# Patient Record
Sex: Female | Born: 1955
Health system: Southern US, Community
[De-identification: ages and names within clinical notes are randomized; demographics above are authoritative.]

## PROBLEM LIST (undated history)

## (undated) DIAGNOSIS — B029 Zoster without complications: Secondary | ICD-10-CM

## (undated) DIAGNOSIS — D863 Sarcoidosis of skin: Secondary | ICD-10-CM

## (undated) DIAGNOSIS — K5790 Diverticulosis of intestine, part unspecified, without perforation or abscess without bleeding: Secondary | ICD-10-CM

## (undated) DIAGNOSIS — M1990 Unspecified osteoarthritis, unspecified site: Secondary | ICD-10-CM

## (undated) DIAGNOSIS — M79673 Pain in unspecified foot: Secondary | ICD-10-CM

## (undated) DIAGNOSIS — H409 Unspecified glaucoma: Secondary | ICD-10-CM

## (undated) DIAGNOSIS — K219 Gastro-esophageal reflux disease without esophagitis: Secondary | ICD-10-CM

## (undated) DIAGNOSIS — D649 Anemia, unspecified: Secondary | ICD-10-CM

## (undated) DIAGNOSIS — Z9221 Personal history of antineoplastic chemotherapy: Secondary | ICD-10-CM

## (undated) DIAGNOSIS — E785 Hyperlipidemia, unspecified: Secondary | ICD-10-CM

## (undated) DIAGNOSIS — T7840XA Allergy, unspecified, initial encounter: Secondary | ICD-10-CM

## (undated) DIAGNOSIS — Z923 Personal history of irradiation: Secondary | ICD-10-CM

## (undated) DIAGNOSIS — IMO0002 Reserved for concepts with insufficient information to code with codable children: Secondary | ICD-10-CM

## (undated) HISTORY — PX: COLONOSCOPY: SHX174

## (undated) HISTORY — PX: ROTATOR CUFF REPAIR: SHX139

## (undated) HISTORY — PX: SPINE SURGERY: SHX786

## (undated) HISTORY — DX: Allergy, unspecified, initial encounter: T78.40XA

## (undated) HISTORY — DX: Pain in unspecified foot: M79.673

## (undated) HISTORY — DX: Unspecified osteoarthritis, unspecified site: M19.90

## (undated) HISTORY — DX: Hyperlipidemia, unspecified: E78.5

## (undated) HISTORY — PX: OOPHORECTOMY: SHX86

## (undated) HISTORY — PX: TONSILLECTOMY: SUR1361

## (undated) HISTORY — DX: Unspecified glaucoma: H40.9

## (undated) HISTORY — DX: Zoster without complications: B02.9

## (undated) HISTORY — PX: APPENDECTOMY: SHX54

## (undated) HISTORY — DX: Sarcoidosis of skin: D86.3

## (undated) HISTORY — PX: DIAGNOSTIC LAPAROSCOPY: SUR761

## (undated) HISTORY — DX: Anemia, unspecified: D64.9

## (undated) HISTORY — PX: ABDOMINAL HYSTERECTOMY: SHX81

## (undated) HISTORY — PX: BREAST SURGERY: SHX581

## (undated) HISTORY — PX: FOOT NEUROMA SURGERY: SHX646

---

## 1997-05-06 HISTORY — PX: OTHER SURGICAL HISTORY: SHX169

## 1997-11-25 ENCOUNTER — Ambulatory Visit (HOSPITAL_COMMUNITY): Admission: RE | Admit: 1997-11-25 | Discharge: 1997-11-25 | Payer: Self-pay | Admitting: Obstetrics and Gynecology

## 1998-03-06 HISTORY — PX: ABDOMINAL HYSTERECTOMY: SHX81

## 1998-03-07 ENCOUNTER — Inpatient Hospital Stay (HOSPITAL_COMMUNITY): Admission: RE | Admit: 1998-03-07 | Discharge: 1998-03-10 | Payer: Self-pay | Admitting: Obstetrics and Gynecology

## 1998-12-12 ENCOUNTER — Ambulatory Visit (HOSPITAL_COMMUNITY): Admission: RE | Admit: 1998-12-12 | Discharge: 1998-12-12 | Payer: Self-pay | Admitting: Obstetrics and Gynecology

## 1998-12-12 ENCOUNTER — Encounter: Payer: Self-pay | Admitting: Obstetrics and Gynecology

## 1999-12-13 ENCOUNTER — Ambulatory Visit (HOSPITAL_COMMUNITY): Admission: RE | Admit: 1999-12-13 | Discharge: 1999-12-13 | Payer: Self-pay | Admitting: Obstetrics and Gynecology

## 1999-12-13 ENCOUNTER — Encounter: Payer: Self-pay | Admitting: Obstetrics and Gynecology

## 2000-05-15 ENCOUNTER — Other Ambulatory Visit: Admission: RE | Admit: 2000-05-15 | Discharge: 2000-05-15 | Payer: Self-pay | Admitting: Obstetrics and Gynecology

## 2000-12-16 ENCOUNTER — Ambulatory Visit (HOSPITAL_COMMUNITY): Admission: RE | Admit: 2000-12-16 | Discharge: 2000-12-16 | Payer: Self-pay | Admitting: Obstetrics and Gynecology

## 2000-12-16 ENCOUNTER — Encounter: Payer: Self-pay | Admitting: Obstetrics and Gynecology

## 2001-12-21 ENCOUNTER — Encounter: Payer: Self-pay | Admitting: Obstetrics and Gynecology

## 2001-12-21 ENCOUNTER — Ambulatory Visit (HOSPITAL_COMMUNITY): Admission: RE | Admit: 2001-12-21 | Discharge: 2001-12-21 | Payer: Self-pay | Admitting: Obstetrics and Gynecology

## 2003-01-11 ENCOUNTER — Encounter: Payer: Self-pay | Admitting: Obstetrics and Gynecology

## 2003-01-11 ENCOUNTER — Ambulatory Visit (HOSPITAL_COMMUNITY): Admission: RE | Admit: 2003-01-11 | Discharge: 2003-01-11 | Payer: Self-pay | Admitting: Obstetrics and Gynecology

## 2003-07-08 ENCOUNTER — Other Ambulatory Visit: Admission: RE | Admit: 2003-07-08 | Discharge: 2003-07-08 | Payer: Self-pay | Admitting: Obstetrics and Gynecology

## 2004-01-20 ENCOUNTER — Ambulatory Visit (HOSPITAL_COMMUNITY): Admission: RE | Admit: 2004-01-20 | Discharge: 2004-01-20 | Payer: Self-pay | Admitting: Obstetrics and Gynecology

## 2004-03-16 ENCOUNTER — Ambulatory Visit: Payer: Self-pay | Admitting: Family Medicine

## 2004-08-22 ENCOUNTER — Ambulatory Visit: Payer: Self-pay | Admitting: Family Medicine

## 2004-09-12 ENCOUNTER — Ambulatory Visit: Payer: Self-pay | Admitting: Family Medicine

## 2005-02-08 ENCOUNTER — Ambulatory Visit (HOSPITAL_COMMUNITY): Admission: RE | Admit: 2005-02-08 | Discharge: 2005-02-08 | Payer: Self-pay | Admitting: Obstetrics and Gynecology

## 2005-07-04 ENCOUNTER — Ambulatory Visit: Payer: Self-pay | Admitting: Family Medicine

## 2005-08-04 ENCOUNTER — Encounter: Payer: Self-pay | Admitting: Family Medicine

## 2005-10-09 ENCOUNTER — Ambulatory Visit: Payer: Self-pay | Admitting: Family Medicine

## 2005-11-15 ENCOUNTER — Ambulatory Visit: Payer: Self-pay | Admitting: Family Medicine

## 2005-11-27 ENCOUNTER — Ambulatory Visit: Payer: Self-pay | Admitting: Family Medicine

## 2005-12-04 HISTORY — PX: OTHER SURGICAL HISTORY: SHX169

## 2005-12-25 ENCOUNTER — Ambulatory Visit: Payer: Self-pay | Admitting: Dermatology

## 2006-01-27 ENCOUNTER — Ambulatory Visit: Payer: Self-pay | Admitting: Family Medicine

## 2006-02-12 ENCOUNTER — Ambulatory Visit (HOSPITAL_COMMUNITY): Admission: RE | Admit: 2006-02-12 | Discharge: 2006-02-12 | Payer: Self-pay | Admitting: Obstetrics and Gynecology

## 2006-04-09 ENCOUNTER — Ambulatory Visit: Payer: Self-pay | Admitting: Family Medicine

## 2006-08-05 LAB — CONVERTED CEMR LAB: Pap Smear: NORMAL

## 2006-08-28 ENCOUNTER — Encounter: Payer: Self-pay | Admitting: Family Medicine

## 2006-08-28 DIAGNOSIS — Z8619 Personal history of other infectious and parasitic diseases: Secondary | ICD-10-CM | POA: Insufficient documentation

## 2006-08-28 DIAGNOSIS — Z87898 Personal history of other specified conditions: Secondary | ICD-10-CM

## 2007-02-18 ENCOUNTER — Ambulatory Visit (HOSPITAL_COMMUNITY): Admission: RE | Admit: 2007-02-18 | Discharge: 2007-02-18 | Payer: Self-pay | Admitting: Obstetrics and Gynecology

## 2007-03-02 ENCOUNTER — Ambulatory Visit: Payer: Self-pay | Admitting: Family Medicine

## 2007-03-04 ENCOUNTER — Encounter: Payer: Self-pay | Admitting: Family Medicine

## 2007-03-24 ENCOUNTER — Ambulatory Visit: Payer: Self-pay | Admitting: Family Medicine

## 2007-03-25 ENCOUNTER — Encounter (INDEPENDENT_AMBULATORY_CARE_PROVIDER_SITE_OTHER): Payer: Self-pay | Admitting: *Deleted

## 2007-03-25 LAB — FECAL OCCULT BLOOD, GUAIAC: Fecal Occult Blood: NEGATIVE

## 2007-12-10 ENCOUNTER — Ambulatory Visit: Payer: Self-pay | Admitting: Family Medicine

## 2008-01-22 ENCOUNTER — Telehealth (INDEPENDENT_AMBULATORY_CARE_PROVIDER_SITE_OTHER): Payer: Self-pay | Admitting: *Deleted

## 2008-02-22 ENCOUNTER — Ambulatory Visit (HOSPITAL_COMMUNITY): Admission: RE | Admit: 2008-02-22 | Discharge: 2008-02-22 | Payer: Self-pay | Admitting: Obstetrics and Gynecology

## 2008-03-02 ENCOUNTER — Ambulatory Visit: Payer: Self-pay | Admitting: Family Medicine

## 2008-03-03 LAB — CONVERTED CEMR LAB
ALT: 15 units/L (ref 0–35)
AST: 19 units/L (ref 0–37)
Albumin: 3.6 g/dL (ref 3.5–5.2)
Alkaline Phosphatase: 56 units/L (ref 39–117)
BUN: 16 mg/dL (ref 6–23)
Chloride: 107 meq/L (ref 96–112)
Eosinophils Relative: 1.6 % (ref 0.0–5.0)
Glucose, Bld: 82 mg/dL (ref 70–99)
HCT: 35.7 % — ABNORMAL LOW (ref 36.0–46.0)
HDL: 63.4 mg/dL (ref >39.0)
Monocytes Relative: 9.6 % (ref 3.0–12.0)
Neutrophils Relative %: 55.2 % (ref 43.0–77.0)
Platelets: 312 10*3/uL (ref 150–400)
Potassium: 4 meq/L (ref 3.5–5.1)
RBC: 3.83 M/uL — ABNORMAL LOW (ref 3.87–5.11)
Total CHOL/HDL Ratio: 3
Total Protein: 7.2 g/dL (ref 6.0–8.3)
VLDL: 16 mg/dL (ref 0–40)
WBC: 5.6 10*3/uL (ref 4.5–10.5)

## 2008-03-10 ENCOUNTER — Ambulatory Visit: Payer: Self-pay | Admitting: Internal Medicine

## 2008-03-24 ENCOUNTER — Ambulatory Visit: Payer: Self-pay | Admitting: Internal Medicine

## 2008-03-24 ENCOUNTER — Encounter: Payer: Self-pay | Admitting: Internal Medicine

## 2008-03-24 LAB — HM COLONOSCOPY

## 2008-03-29 ENCOUNTER — Encounter: Payer: Self-pay | Admitting: Internal Medicine

## 2008-12-20 ENCOUNTER — Telehealth: Payer: Self-pay | Admitting: Family Medicine

## 2009-02-27 ENCOUNTER — Encounter: Payer: Self-pay | Admitting: Family Medicine

## 2009-03-01 ENCOUNTER — Ambulatory Visit (HOSPITAL_COMMUNITY): Admission: RE | Admit: 2009-03-01 | Discharge: 2009-03-01 | Payer: Self-pay | Admitting: Obstetrics and Gynecology

## 2009-03-10 ENCOUNTER — Ambulatory Visit: Payer: Self-pay | Admitting: Family Medicine

## 2009-03-10 DIAGNOSIS — J309 Allergic rhinitis, unspecified: Secondary | ICD-10-CM | POA: Insufficient documentation

## 2009-03-13 LAB — CONVERTED CEMR LAB
ALT: 17 units/L (ref 0–35)
Basophils Relative: 0 % (ref 0.0–3.0)
Bilirubin, Direct: 0 mg/dL (ref 0.0–0.3)
Chloride: 104 meq/L (ref 96–112)
Creatinine, Ser: 0.7 mg/dL (ref 0.4–1.2)
Eosinophils Relative: 1.2 % (ref 0.0–5.0)
HCT: 36.6 % (ref 36.0–46.0)
LDL Cholesterol: 86 mg/dL (ref 0–99)
MCV: 96.2 fL (ref 78.0–100.0)
Monocytes Absolute: 0.5 10*3/uL (ref 0.1–1.0)
Neutrophils Relative %: 53.2 % (ref 43.0–77.0)
Potassium: 3.9 meq/L (ref 3.5–5.1)
RBC: 3.81 M/uL — ABNORMAL LOW (ref 3.87–5.11)
Sodium: 142 meq/L (ref 135–145)
Total CHOL/HDL Ratio: 2
Total Protein: 7.3 g/dL (ref 6.0–8.3)
WBC: 5 10*3/uL (ref 4.5–10.5)

## 2009-07-04 LAB — CONVERTED CEMR LAB: Pap Smear: NORMAL

## 2009-11-01 ENCOUNTER — Ambulatory Visit: Payer: Self-pay | Admitting: Family Medicine

## 2010-02-03 LAB — HM MAMMOGRAPHY: HM Mammogram: NORMAL

## 2010-02-07 ENCOUNTER — Encounter: Payer: Self-pay | Admitting: Internal Medicine

## 2010-03-05 ENCOUNTER — Ambulatory Visit (HOSPITAL_COMMUNITY)
Admission: RE | Admit: 2010-03-05 | Discharge: 2010-03-05 | Payer: Self-pay | Source: Home / Self Care | Admitting: Obstetrics and Gynecology

## 2010-05-16 ENCOUNTER — Telehealth (INDEPENDENT_AMBULATORY_CARE_PROVIDER_SITE_OTHER): Payer: Self-pay | Admitting: *Deleted

## 2010-05-17 ENCOUNTER — Other Ambulatory Visit: Payer: Self-pay | Admitting: Family Medicine

## 2010-05-17 ENCOUNTER — Ambulatory Visit
Admission: RE | Admit: 2010-05-17 | Discharge: 2010-05-17 | Payer: Self-pay | Source: Home / Self Care | Attending: Family Medicine | Admitting: Family Medicine

## 2010-05-17 LAB — CBC WITH DIFFERENTIAL/PLATELET
Basophils Absolute: 0 10*3/uL (ref 0.0–0.1)
Basophils Relative: 0.3 % (ref 0.0–3.0)
Eosinophils Absolute: 0.1 10*3/uL (ref 0.0–0.7)
Eosinophils Relative: 2.3 % (ref 0.0–5.0)
HCT: 34.4 % — ABNORMAL LOW (ref 36.0–46.0)
Hemoglobin: 11.8 g/dL — ABNORMAL LOW (ref 12.0–15.0)
Lymphocytes Relative: 33.9 % (ref 12.0–46.0)
Lymphs Abs: 2.1 10*3/uL (ref 0.7–4.0)
MCHC: 34.2 g/dL (ref 30.0–36.0)
MCV: 94.1 fl (ref 78.0–100.0)
Monocytes Absolute: 0.7 10*3/uL (ref 0.1–1.0)
Monocytes Relative: 10.5 % (ref 3.0–12.0)
Neutro Abs: 3.3 10*3/uL (ref 1.4–7.7)
Neutrophils Relative %: 53 % (ref 43.0–77.0)
Platelets: 274 10*3/uL (ref 150.0–400.0)
RBC: 3.65 Mil/uL — ABNORMAL LOW (ref 3.87–5.11)
RDW: 13.7 % (ref 11.5–14.6)
WBC: 6.3 10*3/uL (ref 4.5–10.5)

## 2010-05-17 LAB — LIPID PANEL
Cholesterol: 189 mg/dL (ref 0–200)
HDL: 78.2 mg/dL (ref >39.00)
LDL Cholesterol: 100 mg/dL — ABNORMAL HIGH (ref 0–99)
Total CHOL/HDL Ratio: 2
Triglycerides: 52 mg/dL (ref 0.0–149.0)
VLDL: 10.4 mg/dL (ref 0.0–40.0)

## 2010-05-17 LAB — BASIC METABOLIC PANEL
BUN: 18 mg/dL (ref 6–23)
CO2: 29 mEq/L (ref 19–32)
Calcium: 9.2 mg/dL (ref 8.4–10.5)
Chloride: 103 mEq/L (ref 96–112)
Creatinine, Ser: 0.7 mg/dL (ref 0.4–1.2)
GFR: 88.87 mL/min (ref >60.00)
Glucose, Bld: 84 mg/dL (ref 70–99)
Potassium: 4 mEq/L (ref 3.5–5.1)
Sodium: 138 mEq/L (ref 135–145)

## 2010-05-17 LAB — HEPATIC FUNCTION PANEL
ALT: 13 U/L (ref 0–35)
AST: 18 U/L (ref 0–37)
Albumin: 3.7 g/dL (ref 3.5–5.2)
Alkaline Phosphatase: 55 U/L (ref 39–117)
Bilirubin, Direct: 0 mg/dL (ref 0.0–0.3)
Total Bilirubin: 0.4 mg/dL (ref 0.3–1.2)
Total Protein: 6.6 g/dL (ref 6.0–8.3)

## 2010-05-17 LAB — TSH: TSH: 2.34 u[IU]/mL (ref 0.35–5.50)

## 2010-05-24 ENCOUNTER — Encounter (INDEPENDENT_AMBULATORY_CARE_PROVIDER_SITE_OTHER): Payer: Self-pay | Admitting: *Deleted

## 2010-05-24 ENCOUNTER — Ambulatory Visit
Admission: RE | Admit: 2010-05-24 | Discharge: 2010-05-24 | Payer: Self-pay | Source: Home / Self Care | Attending: Family Medicine | Admitting: Family Medicine

## 2010-05-24 DIAGNOSIS — D649 Anemia, unspecified: Secondary | ICD-10-CM | POA: Insufficient documentation

## 2010-06-07 NOTE — Progress Notes (Signed)
----   Converted from flag ---- ---- 05/15/2010 8:12 AM, Judith Part MD wrote: please check lipid and wellness v70.0 thanks  ---- 05/14/2010 11:36 AM, Liane Comber CMA (AAMA) wrote: Lab orders please! Good Morning! This pt is scheduled for cpx labs [place], which labs to draw and dx codes to use? Thanks Tasha ------------------------------

## 2010-06-07 NOTE — Assessment & Plan Note (Signed)
Summary: CPX/JRR   Vital Signs:  Patient profile:   55 year old female Height:      63.5 inches Weight:      151.50 pounds BMI:     26.51 Temp:     98.0 degrees F oral Pulse rate:   76 / minute Pulse rhythm:   regular BP sitting:   110 / 74  (left arm) Cuff size:   regular  Vitals Entered By: Selena Batten Dance CMA Duncan Dull) (May 24, 2010 9:48 AM) CC: CPx   History of Present Illness: here for wellness exam and also to review chronic medical problems   feeling good overall  had a little flare of opth zoster- being treated for that  more general aches and pains with age  exercise is really important to her   wt is up 5 lb  110/74 - good bp   hyst in 99-- was for endometriosis - long hx , no abn paps or cancer  gyn visit  3/11 -- did do pap  and breast exam  colonosc 09 with tics and fam hx ca-- due in 2014 is a little newly anemic no GI symptoms whatsoever   mam 10/11 nl  self exam- no lumps   td 06   lipids good but up a bit from last year Last Lipid ProfileCholesterol: 189 (05/17/2010 8:39:19 AM)HDL:  78.20 (05/17/2010 8:39:19 AM)LDL:  100 (05/17/2010 8:39:19 AM)Triglycerides:  Last Liver profileSGOT:  18 (05/17/2010 8:39:19 AM)SPGT:  13 (05/17/2010 8:39:19 AM)T. Bili:  0.4 (05/17/2010 8:39:19 AM)Alk Phos:  55 (05/17/2010 8:39:19 AM)  diet is very good  weight watchers diet as a rule some holiday eating   slt anemia hb is 11.8-- she last donated blood in 2010  none since then  has not changed diet at all   Allergies: 1)  ! Codeine 2)  ! Imitrex  Past History:  Past Medical History: Last updated: 03/02/2008 Cutaneous sarcoid migraine foot pain zoster- opthy (regular checks) GYN -- Dr Tenny Craw  derm-- Otilio Saber-- Al Corpus   Past Surgical History: Last updated: 04/01/2008 Hysterectomy (03/1998) Arm lesion- cutaneous sarcoid (12/2005), with normal CXR and ANA 2D Echo- neg. (1999) (11/09) colonoscopy- polyp, diverticulosis  Family History: Last updated:  03/02/2008 mother CAD, ETOH brother cad brother cad, pre diabetic, obese brother - colon/ liver cancer , DM   Social History: Last updated: 03/10/2009 non smoker  works for [employer]  regular exercise - curves/ elliptical/ walking no alcohol   Risk Factors: Smoking Status: never (08/28/2006)  Review of Systems General:  Denies fatigue, fever, loss of appetite, and malaise. Eyes:  Complains of eye irritation; denies blurring. CV:  Denies chest pain or discomfort, lightheadness, palpitations, and shortness of breath with exertion. Resp:  Denies cough, shortness of breath, and wheezing. GI:  Denies abdominal pain, change in bowel habits, indigestion, and nausea. GU:  Denies dysuria and urinary frequency. MS:  Denies muscle aches and cramps. Derm:  Denies itching, lesion(s), poor wound healing, and rash. Neuro:  Denies numbness and tingling. Psych:  mood is good . Endo:  Denies cold intolerance, excessive thirst, excessive urination, and heat intolerance. Heme:  Denies abnormal bruising and bleeding.  Physical Exam  General:  Well-developed,well-nourished,in no acute distress; alert,appropriate and cooperative throughout examination Head:  normocephalic, atraumatic, and no abnormalities observed.   Eyes:  vision grossly intact, pupils equal, pupils round, and pupils reactive to light.  no conjunctival pallor, injection or icterus  Ears:  R ear normal and L ear normal.  Nose:  no nasal discharge.   Mouth:  pharynx pink and moist.   Neck:  supple with full rom and no masses or thyromegally, no JVD or carotid bruit  Chest Wall:  No deformities, masses, or tenderness noted. Lungs:  Normal respiratory effort, chest expands symmetrically. Lungs are clear to auscultation, no crackles or wheezes. Heart:  Normal rate and regular rhythm. S1 and S2 normal without gallop, murmur, click, rub or other extra sounds. Abdomen:  Bowel sounds positive,abdomen soft and non-tender without  masses, organomegaly or hernias noted. no renal bruits  Msk:  No deformity or scoliosis noted of thoracic or lumbar spine.  no acute joint changes  Pulses:  R and L carotid,radial,femoral,dorsalis pedis and posterior tibial pulses are full and equal bilaterally Extremities:  No clubbing, cyanosis, edema, or deformity noted with normal full range of motion of all joints.   Neurologic:  sensation intact to light touch, gait normal, and DTRs symmetrical and normal.   Skin:  Intact without suspicious lesions or rashes lentigos diffusely  Cervical Nodes:  No lymphadenopathy noted Inguinal Nodes:  No significant adenopathy Psych:  normal affect, talkative and pleasant    Impression & Recommendations:  Problem # 1:  HEALTH MAINTENANCE EXAM (ICD-V70.0) Assessment Comment Only reviewed health habits including diet, exercise and skin cancer prevention reviewed health maintenance list and family history commended on good habits  rev wellness labs in detail   Problem # 2:  UNSPECIFIED ANEMIA (ICD-285.9) this is new and asymptomatic  with family hx of colon cancer will ref to GI  was originally due colonosc in 2014  Orders: Gastroenterology Referral (GI)  Problem # 3:  Hx of HERPES ZOSTER OPHTHALMICUS (ICD-053.20) Assessment: Comment Only this is improving - will continue opthy f/u  Complete Medication List: 1)  Premarin 0.45 Mg Tabs (Estrogens conjugated) .Marland Kitchen.. 1 by mouth qd 2)  Daily Multiple Vitamins Tabs (Multiple vitamin) .Marland Kitchen.. 1 by mouth qd 3)  Flonase 50 Mcg/act Susp (Fluticasone propionate) .Marland Kitchen.. 1 spray in each nostril once daily 4)  Calcium Plus Vitamin D 500-50 Mg-unit Caps (Calcium carbonate-vitamin d) .... As directed 5)  Lotemax 0.5 % Susp (Loteprednol etabonate) .... As directed 6)  Aspirin Adult Low Strength 81 Mg Tbec (Aspirin) .... One by mouth daily 7)  Mobic 15 Mg Tabs (Meloxicam) .Marland Kitchen.. 1 by mouth once daily with food 8)  Durezol 0.05 % Emul (Difluprednate) .Marland Kitchen.. 1 drop in  eye once daily  Other Orders: Prescription Created Electronically [id number])  Patient Instructions: 1)  we will do GI referral at check out for new anemia  2)  keep up the good work with healthy diet and exercise  Prescriptions: MOBIC 15 MG  TABS (MELOXICAM) 1 by mouth once daily with food  #90 x 3   Entered and Authorized by:   Judith Part MD   Signed by:   Judith Part MD on 05/24/2010   Method used:   Electronically to        [facility] [address] (retail)       [address], [address]       [place], [place]  [address]       Ph: [phone]       Fax: [phone]   RxID:   [id number] FLONASE 50 MCG/ACT SUSP (FLUTICASONE PROPIONATE) 1 spray in each nostril once daily  #1 mdi x 11   Entered and Authorized by:   Judith Part MD  Signed by:   Judith Part MD on 05/24/2010   Method used:   Electronically to        [facility] [address] (retail)       [address], [address]       [place], [place]  [address]       Ph: [phone]       Fax: [phone]   RxID:   [id number]    Orders Added: 1)  Gastroenterology Referral [GI] 2)  Prescription Created Electronically [[id number]] 3)  Est. Patient 40-64 years [phone]    Current Allergies (reviewed today): ! CODEINE ! IMITREX   Preventive Care Screening  Mammogram:    Date:  02/03/2010    Results:  normal   Pap Smear:    Date:  07/04/2009    Results:  normal

## 2010-06-07 NOTE — Assessment & Plan Note (Signed)
Summary: place on neck and scalp/alc   Vital Signs:  Patient profile:   55 year old female Height:      63.5 inches Weight:      146.38 pounds Temp:     98.4 degrees F oral Pulse rate:   76 / minute Pulse rhythm:   regular BP sitting:   118 / 72  (left arm) Cuff size:   regular  Vitals Entered By: Linde Gillis CMA Duncan Dull) (November 01, 2009 10:36 AM) CC: check place on neck and scalp   History of Present Illness: 55 yo here for boil on her scalp.  States that she has had several of these in past, typically drain pus and go away.  5 days ago, started to develop one on her left temporal scalp. This one has become very large, red and painful.  Husband stuck it with a sterile needle and it did drain a little.  Today, much less swollen but still red and painful.  Also noticed a swollen gland in her neck this morning.  No fevers, chills, nausea or vomiting.  Current Medications (verified): 1)  Premarin 0.45 Mg Tabs (Estrogens Conjugated) .Marland Kitchen.. 1 By Mouth Qd 2)  Daily Multiple Vitamins  Tabs (Multiple Vitamin) .Marland Kitchen.. 1 By Mouth Qd 3)  Flonase 50 Mcg/act Susp (Fluticasone Propionate) .Marland Kitchen.. 1 Spray in Each Nostril Once Daily 4)  Calcium Plus Vitamin D 500-50 Mg-Unit Caps (Calcium Carbonate-Vitamin D) .... As Directed 5)  Lotemax 0.5 %  Susp (Loteprednol Etabonate) .... As Directed 6)  Aspirin Adult Low Strength 81 Mg  Tbec (Aspirin) .... One By Mouth Daily 7)  Mobic 15 Mg  Tabs (Meloxicam) .Marland Kitchen.. 1 By Mouth Once Daily 8)  Keflex 500 Mg Caps (Cephalexin) .Marland Kitchen.. 1 Tablet Twice A Day X 7 Days  Allergies: 1)  ! Codeine 2)  ! Imitrex  Past History:  Past Medical History: Last updated: 03/02/2008 Cutaneous sarcoid migraine foot pain zoster- opthy (regular checks) GYN -- Dr Tenny Craw  derm-- Otilio Saber-- Al Corpus   Past Surgical History: Last updated: 04/01/2008 Hysterectomy (03/1998) Arm lesion- cutaneous sarcoid (12/2005), with normal CXR and ANA 2D Echo- neg. (1999) (11/09) colonoscopy-  polyp, diverticulosis  Family History: Last updated: 03/02/2008 mother CAD, ETOH brother cad brother cad, pre diabetic, obese brother - colon/ liver cancer , DM   Social History: Last updated: 03/10/2009 non smoker  works for [employer]  regular exercise - curves/ elliptical/ walking no alcohol   Risk Factors: Smoking Status: never (08/28/2006)  Review of Systems      See HPI General:  Denies fever and malaise. GI:  Denies nausea and vomiting.  Physical Exam  General:  Well-developed,well-nourished,in no acute distress; alert,appropriate and cooperative throughout examination Head:  3 cm x 2 cm, non fluctuant abscess with central opening, mild erythema surrounding it. Mildly tender to palpation. Cervical Nodes:  L anterior LN enlarged, non tender. Psych:  normal affect, talkative and pleasant    Impression & Recommendations:  Problem # 1:  CELLULITIS AND ABSCESS OF OTHER SPECIFIED SITE [phone]) Assessment New Given reactive lymph node with surrounding erythema, will place on 7 day course of by mouth keflex. Advised to follow up next week if abscess still painful and red, sooner if she develops systemic symptoms. Pt in agreement with plan. Her updated medication list for this problem includes:    Keflex 500 Mg Caps (Cephalexin) .Marland Kitchen... 1 tablet twice a day x 7 days  Complete Medication List: 1)  Premarin 0.45 Mg Tabs (Estrogens conjugated) .Marland KitchenMarland KitchenMarland Kitchen  1 by mouth qd 2)  Daily Multiple Vitamins Tabs (Multiple vitamin) .Marland Kitchen.. 1 by mouth qd 3)  Flonase 50 Mcg/act Susp (Fluticasone propionate) .Marland Kitchen.. 1 spray in each nostril once daily 4)  Calcium Plus Vitamin D 500-50 Mg-unit Caps (Calcium carbonate-vitamin d) .... As directed 5)  Lotemax 0.5 % Susp (Loteprednol etabonate) .... As directed 6)  Aspirin Adult Low Strength 81 Mg Tbec (Aspirin) .... One by mouth daily 7)  Mobic 15 Mg Tabs (Meloxicam) .Marland Kitchen.. 1 by mouth once daily 8)  Keflex 500 Mg Caps (Cephalexin) .Marland Kitchen.. 1 tablet  twice a day x 7 days Prescriptions: KEFLEX 500 MG CAPS (CEPHALEXIN) 1 tablet twice a day x 7 days  #14 x 0   Entered and Authorized by:   Ruthe Mannan MD   Signed by:   Ruthe Mannan MD on 11/01/2009   Method used:   Electronically to        [facility] [address] (retail)       [address]       [place], [place]  [address]       Ph: [phone]       Fax: [phone]   RxID:   [id number]   Current Allergies (reviewed today): ! CODEINE ! IMITREX

## 2010-06-07 NOTE — Letter (Signed)
Summary: New Patient letter  [facility]  [address] [place], [place] [address]   Phone: [phone]  Fax: [phone]       05/24/2010 MRN: [id number]  [facility] [address] [place], [place]  [address]  Dear Ms. Lipps,  Welcome to the Gastroenterology Division at [facility].    You are scheduled to see Dr.  Stan Head on July 09, 2010 at 9:00am on the 3rd floor at [facility], [address]  We ask that you try to arrive at our office 15 minutes prior to your appointment time to allow for check-in.  We would like you to complete the enclosed self-administered evaluation form prior to your visit and bring it with you on the day of your appointment.  We will review it with you.  Also, please bring a complete list of all your medications or, if you prefer, bring the medication bottles and we will list them.  Please bring your insurance card so that we may make a copy of it.  If your insurance requires a referral to see a specialist, please bring your referral form from your primary care physician.  Co-payments are due at the time of your visit and may be paid by cash, check or credit card.     Your office visit will consist of a consult with your physician (includes a physical exam), any laboratory testing he/she may order, scheduling of any necessary diagnostic testing (e.g. x-ray, ultrasound, CT-scan), and scheduling of a procedure (e.g. Endoscopy, Colonoscopy) if required.  Please allow enough time on your schedule to allow for any/all of these possibilities.    If you cannot keep your appointment, please call [phone] to cancel or reschedule prior to your appointment date.  This allows [place] the opportunity to schedule an appointment for another patient in need of care.  If you do not cancel or reschedule by 5 p.m. the business day prior to your appointment date, you will be charged a $50.00 late cancellation/no-show fee.    Thank you for  choosing [facility] for your medical needs.  We appreciate the opportunity to care for you.  Please visit [place] at our website  to learn more about our practice.                     Sincerely,                                                             The Gastroenterology Division

## 2010-06-07 NOTE — Miscellaneous (Signed)
Summary: FLU SHOT  Clinical Lists Changes  Observations: Added new observation of FLU VAX: Historical (02/02/2010 12:20)      Influenza Immunization History:    Influenza # 1:  Historical (02/02/2010)

## 2010-07-09 ENCOUNTER — Ambulatory Visit (INDEPENDENT_AMBULATORY_CARE_PROVIDER_SITE_OTHER): Payer: BC Managed Care – PPO | Admitting: Internal Medicine

## 2010-07-09 ENCOUNTER — Other Ambulatory Visit: Payer: Self-pay | Admitting: Internal Medicine

## 2010-07-09 ENCOUNTER — Encounter: Payer: Self-pay | Admitting: Internal Medicine

## 2010-07-09 ENCOUNTER — Encounter (INDEPENDENT_AMBULATORY_CARE_PROVIDER_SITE_OTHER): Payer: Self-pay | Admitting: *Deleted

## 2010-07-09 ENCOUNTER — Other Ambulatory Visit: Payer: BC Managed Care – PPO

## 2010-07-09 DIAGNOSIS — D649 Anemia, unspecified: Secondary | ICD-10-CM

## 2010-07-09 LAB — CBC WITH DIFFERENTIAL/PLATELET
Basophils Absolute: 0 10*3/uL (ref 0.0–0.1)
Eosinophils Absolute: 0.1 10*3/uL (ref 0.0–0.7)
HCT: 34.1 % — ABNORMAL LOW (ref 36.0–46.0)
Lymphs Abs: 1.6 10*3/uL (ref 0.7–4.0)
MCHC: 34.3 g/dL (ref 30.0–36.0)
MCV: 93.2 fl (ref 78.0–100.0)
Monocytes Absolute: 0.6 10*3/uL (ref 0.1–1.0)
Platelets: 304 10*3/uL (ref 150.0–400.0)
RDW: 13.6 % (ref 11.5–14.6)

## 2010-07-10 ENCOUNTER — Other Ambulatory Visit: Payer: Self-pay | Admitting: Internal Medicine

## 2010-07-10 ENCOUNTER — Other Ambulatory Visit: Payer: BC Managed Care – PPO

## 2010-07-10 DIAGNOSIS — D649 Anemia, unspecified: Secondary | ICD-10-CM

## 2010-07-10 LAB — IBC PANEL: Iron: 50 ug/dL (ref 42–145)

## 2010-07-11 ENCOUNTER — Telehealth: Payer: Self-pay | Admitting: Internal Medicine

## 2010-07-17 NOTE — Assessment & Plan Note (Signed)
Summary: ANEMAI.Marland KitchenMarland KitchenEM  SCHED WITH [place], [employer]- INS COPAY AND CX FEE AD...   History of Present Illness Visit Type: Initial Consult Primary GI MD: Stan Head MD [facility] Primary Provider: Roxy Manns, MD Requesting Provider: Roxy Manns, MD Chief Complaint: Anemia History of Present Illness:   55 yo white woman with a new mild anemia with Hgb 11.8. She donated blood once in 2010. She had a negative colonoscopy in 2009 due to family hx colon cancer and screening. No GI symtoms or bleeding noted.          Preventive Screening-Counseling & Management      Drug Use:  no.      Current Medications (verified): 1)  Premarin 0.45 Mg Tabs (Estrogens Conjugated) .Marland Kitchen.. 1 By Mouth Qd 2)  Daily Multiple Vitamins  Tabs (Multiple Vitamin) .Marland Kitchen.. 1 By Mouth Qd 3)  Flonase 50 Mcg/act Susp (Fluticasone Propionate) .Marland Kitchen.. 1 Spray in Each Nostril Once Daily 4)  Calcium Plus Vitamin D 500-50 Mg-Unit Caps (Calcium Carbonate-Vitamin D) .... As Directed 5)  Prednisolone Acetate 1 % Susp (Prednisolone Acetate) .... Instill in Left Eye Two Times A Day 6)  Aspirin Adult Low Strength 81 Mg  Tbec (Aspirin) .... One By Mouth Daily 7)  Mobic 15 Mg  Tabs (Meloxicam) .Marland Kitchen.. 1 By Mouth Once Daily With Food  Allergies (verified): 1)  ! Codeine 2)  ! Imitrex  Past History:  Past Medical History: Cutaneous sarcoid migraine foot pain zoster- opthy (regular checks)  GYN -- Dr Tenny Craw  derm-- Alva Garnet pod-- Al Corpus   Past Surgical History: Hysterectomy (03/1998) Arm lesion- cutaneous sarcoid (12/2005), with normal CXR and ANA 2D Echo- neg. (1999) (11/09) colonoscopy, diverticulosis, hemorrhoids C-section x2 Tonsillectomy  Family History: mother CAD, ETOH brother cad brother cad, pre diabetic, obese brother - colon cancer + liver mets at 46, DM   Social History: non smoker  works for [employer]  regular exercise - curves/ elliptical/ walking no alcohol  Daily Caffeine Use Illicit Drug Use -  no Drug Use:  no  Review of Systems       The patient complains of allergy/sinus, anemia, and change in vision.         All other ROS negative except as per HPI.   Vital Signs:  Patient profile:   55 year old female Height:      63.5 inches Weight:      151.38 pounds BMI:     26.49 Pulse rate:   64 / minute Pulse rhythm:   regular BP sitting:   110 / 68  (left arm) Cuff size:   regular  Vitals Entered By: June McMurray CMA Duncan Dull) (July 09, 2010 9:04 AM)  Physical Exam  General:  Well developed, well nourished, no acute distress. Mouth:  pink tongue and mucous membranes Lungs:  Clear throughout to auscultation. Heart:  Regular rate and rhythm; no murmurs, rubs,  or bruits. Abdomen:  Bowel sounds positive,abdomen soft and non-tender without masses, organomegaly or hernias noted.  Extremities:  no edema Psych:  Alert and cooperative. Normal mood and affect.   Impression & Recommendations:  Problem # 1:  UNSPECIFIED ANEMIA (ICD-285.9) Assessment Comment Only NEW Very mild normcytic anemia. Will first repeat CBC. Do not think endoscopic evaluation indicated at this time.  Orders: TLB-CBC Platelet - w/Differential ([id number])  Problem # 2:  NEOPLASM, MALIGNANT, COLON, FAMILY HX (ICD-V16.0) Assessment: Unchanged At this point plan fr routine colonoscpy in 2014, unless lab evaluation indicates therwise.  Patient Instructions: 1)  Copy  sent to : Roxy Manns, MD 2)  You will go to the basement for labs today 3)  The medication list was reviewed and reconciled.  All changed / newly prescribed medications were explained.  A complete medication list was provided to the patient / caregiver.

## 2010-07-17 NOTE — Progress Notes (Signed)
Summary: anemia follow-up  Phone Note Outgoing Call   Summary of Call: iron levels are normal but low normal ask her to do hemoccults re: anemia (guaiac testing not iFOBT) Iva Boop MD, [facility]  July 11, 2010 10:37 PM   Follow-up for Phone Call        left message on machine to call back Chales Abrahams CMA Duncan Dull)  July 12, 2010 4:26 PM   Spoke with the pt she ask to have the cards mailed to her home and she was instructed how to complete and will get those sent back as soon as possible.  Order added to EPIC Follow-up by: Chales Abrahams CMA Duncan Dull),  July 12, 2010 4:36 PM

## 2010-07-26 ENCOUNTER — Other Ambulatory Visit: Payer: BC Managed Care – PPO

## 2010-07-27 ENCOUNTER — Other Ambulatory Visit: Payer: Self-pay | Admitting: Internal Medicine

## 2010-07-27 ENCOUNTER — Other Ambulatory Visit: Payer: BC Managed Care – PPO

## 2010-07-27 DIAGNOSIS — Z0000 Encounter for general adult medical examination without abnormal findings: Secondary | ICD-10-CM

## 2010-07-27 LAB — HEMOCCULT SLIDES (X 3 CARDS)
Fecal Occult Blood: NEGATIVE
OCCULT 2: NEGATIVE
OCCULT 3: NEGATIVE
OCCULT 5: NEGATIVE

## 2010-07-31 ENCOUNTER — Telehealth: Payer: Self-pay

## 2010-07-31 DIAGNOSIS — E611 Iron deficiency: Secondary | ICD-10-CM

## 2010-07-31 NOTE — Telephone Encounter (Signed)
I spoke with the patient and she is advised of Dr Marvell Fuller recommendations.  New lab orders are placed for 10/01/10.  She will begin an OTC iron supplement.

## 2010-07-31 NOTE — Telephone Encounter (Signed)
Message copied by Darcey Nora on Tue Jul 31, 2010  8:27 AM ------      Message from: Stan Head      Created: Mon Jul 30, 2010  9:30 PM       Let her know no blood in stools      Overall it does not seem like she is having anemia due to GI blood loss, though iron levels low normal range      She should start ferrous sulfate 325 mg daily (add to med list)       Repeat CBC and ferritin re: anemia in 6 weeks.      Would not pursue endoscopic studies at this time.

## 2010-08-01 MED ORDER — FERROUS SULFATE 325 (65 FE) MG PO TBEC: DELAYED_RELEASE_TABLET | ORAL | Status: DC

## 2010-08-01 NOTE — Telephone Encounter (Signed)
Addended by: Darcey Nora on: 08/01/2010 09:18 AM   Modules accepted: Orders

## 2010-10-03 ENCOUNTER — Other Ambulatory Visit (INDEPENDENT_AMBULATORY_CARE_PROVIDER_SITE_OTHER): Payer: BC Managed Care – PPO

## 2010-10-03 DIAGNOSIS — E611 Iron deficiency: Secondary | ICD-10-CM

## 2010-10-03 LAB — CBC WITH DIFFERENTIAL/PLATELET
Basophils Absolute: 0 10*3/uL (ref 0.0–0.1)
Eosinophils Absolute: 0.1 10*3/uL (ref 0.0–0.7)
Lymphocytes Relative: 31 % (ref 12.0–46.0)
Lymphs Abs: 1.8 10*3/uL (ref 0.7–4.0)
MCHC: 33.9 g/dL (ref 30.0–36.0)
Monocytes Relative: 12.2 % — ABNORMAL HIGH (ref 3.0–12.0)
Platelets: 300 10*3/uL (ref 150.0–400.0)
RDW: 13.5 % (ref 11.5–14.6)

## 2010-10-03 LAB — FERRITIN: Ferritin: 37.6 ng/mL (ref 10.0–291.0)

## 2010-10-04 ENCOUNTER — Telehealth: Payer: Self-pay

## 2010-10-04 NOTE — Telephone Encounter (Signed)
Patient is scheduled for EGD [facility] 11/15/10 1:30.  Pre-visit is scheduled for 11/05/10 2:00

## 2010-10-04 NOTE — Telephone Encounter (Signed)
Message copied by Annett Fabian on Thu Oct 04, 2010  9:30 AM ------      Message from: Stan Head E      Created: Thu Oct 04, 2010  8:01 AM       Iron level and Hgb about the same despite iron therapy      Given this and Mobic use i recommend an EGD to evaluate the anemia

## 2010-10-04 NOTE — Progress Notes (Signed)
Quick Note:  Iron level and Hgb about the same despite iron therapy Given this and Mobic use i recommend an EGD to evaluate the anemia ______

## 2010-11-05 ENCOUNTER — Ambulatory Visit (AMBULATORY_SURGERY_CENTER): Payer: BC Managed Care – PPO | Admitting: *Deleted

## 2010-11-05 ENCOUNTER — Encounter: Payer: Self-pay | Admitting: Internal Medicine

## 2010-11-15 ENCOUNTER — Ambulatory Visit (AMBULATORY_SURGERY_CENTER): Payer: BC Managed Care – PPO | Admitting: Internal Medicine

## 2010-11-15 ENCOUNTER — Encounter: Payer: Self-pay | Admitting: Internal Medicine

## 2010-11-15 VITALS — BP 122/67 | HR 61 | Temp 97.6°F | Resp 17 | Ht 63.0 in | Wt 145.0 lb

## 2010-11-15 DIAGNOSIS — K2970 Gastritis, unspecified, without bleeding: Secondary | ICD-10-CM

## 2010-11-15 DIAGNOSIS — D649 Anemia, unspecified: Secondary | ICD-10-CM

## 2010-11-15 HISTORY — PX: UPPER GASTROINTESTINAL ENDOSCOPY: SHX188

## 2010-11-15 MED ORDER — SODIUM CHLORIDE 0.9 % IV SOLN: 500.0000 mL | INTRAVENOUS | Status: DC

## 2010-11-15 NOTE — Patient Instructions (Addendum)
The endoscopy showed a mild gastritis. Biopsies were taken to look for an infection called Helicobacter pylori which increases the risk of ulcers if present and if so will be treated with antibiotics. I will notify you of those results. If you need to take the Mobic on a daily basis long-term, I will probably recommend a long-term acid blocking medication to reduce the risk of ulcers in the future. Iva Boop, MD, [facility] Discharge instructions given. Handout on gastritis and a soft diet given. Resume previous medications.

## 2010-11-16 ENCOUNTER — Telehealth: Payer: Self-pay | Admitting: *Deleted

## 2010-11-16 DIAGNOSIS — K2990 Gastroduodenitis, unspecified, without bleeding: Secondary | ICD-10-CM

## 2010-11-16 DIAGNOSIS — D649 Anemia, unspecified: Secondary | ICD-10-CM

## 2010-11-16 DIAGNOSIS — K2970 Gastritis, unspecified, without bleeding: Secondary | ICD-10-CM

## 2010-11-16 NOTE — Telephone Encounter (Signed)

## 2010-11-22 ENCOUNTER — Encounter: Payer: Self-pay | Admitting: Internal Medicine

## 2010-11-22 NOTE — Progress Notes (Signed)
Quick Note:  1) Office RN to call her and let her know H. Pylori negative - she should have a repeat CBC and Ferritin next week. She may need to get her colonoscopy earlier if iron levels not improving - will discuss.  We do not have a clear cause as to why iron levels on low side  It is reasonable for her to take a PPI like OTC Prilosec or Prevacid on a daily basis to reduce risk of ulcers from Mobic and ASA though guidelines do not recommend to start until age 70 in that situation - she is getting close to that age. She can decide if she wants to as not yet 58 but getting close.  2) [facility] no letter or recall  ______

## 2010-11-23 ENCOUNTER — Telehealth: Payer: Self-pay

## 2010-11-23 DIAGNOSIS — D509 Iron deficiency anemia, unspecified: Secondary | ICD-10-CM

## 2010-11-23 NOTE — Telephone Encounter (Signed)
Patient advised.  Labs entered for Monday of next week.

## 2010-11-23 NOTE — Telephone Encounter (Signed)
Message copied by Annett Fabian on Fri Nov 23, 2010 11:27 AM ------      Message from: Stan Head E      Created: Thu Nov 22, 2010 10:02 PM       1) Office RN to call her and let her know H. Pylori negative - she should have a repeat CBC and Ferritin next week. She may need to get her colonoscopy earlier if iron levels not improving - will discuss.       We do not have a clear cause as to why iron levels on low side       It is reasonable for her to take a PPI like OTC Prilosec or Prevacid on a daily basis to reduce risk of ulcers from Mobic and ASA though guidelines do not recommend to start until age 78 in that situation - she is getting close to that age. She can decide if she wants to as not yet 58 but getting close.            2) [facility] no letter or recall

## 2010-11-26 ENCOUNTER — Other Ambulatory Visit (INDEPENDENT_AMBULATORY_CARE_PROVIDER_SITE_OTHER): Payer: BC Managed Care – PPO

## 2010-11-26 DIAGNOSIS — D509 Iron deficiency anemia, unspecified: Secondary | ICD-10-CM

## 2010-11-26 LAB — CBC WITH DIFFERENTIAL/PLATELET
Basophils Relative: 0.3 % (ref 0.0–3.0)
Eosinophils Relative: 1.1 % (ref 0.0–5.0)
MCV: 93.4 fl (ref 78.0–100.0)
Monocytes Absolute: 0.5 10*3/uL (ref 0.1–1.0)
Neutrophils Relative %: 63.8 % (ref 43.0–77.0)
RBC: 3.71 Mil/uL — ABNORMAL LOW (ref 3.87–5.11)
WBC: 7.2 10*3/uL (ref 4.5–10.5)

## 2010-11-28 NOTE — Progress Notes (Signed)
Quick Note:  Hgb same - just slightly low Iron level slightly better Since she was heme negative I would not do any other testing She should stay on ferrous sulfate and keep follow-up with Dr. Milinda Antis - would repeat iron levels in 3-6 months She has had a low normal or slightly low Hgb for 2 years or more - if this worsens over time would consider heme eval (per PCP) and repeat hemocults. Routine repeat colonoscopy 5 years after last (will be 2014)  ______

## 2011-02-21 ENCOUNTER — Other Ambulatory Visit (HOSPITAL_COMMUNITY): Payer: Self-pay | Admitting: Obstetrics and Gynecology

## 2011-02-21 DIAGNOSIS — Z1231 Encounter for screening mammogram for malignant neoplasm of breast: Secondary | ICD-10-CM

## 2011-03-05 ENCOUNTER — Encounter: Payer: Self-pay | Admitting: Family Medicine

## 2011-03-05 ENCOUNTER — Ambulatory Visit (INDEPENDENT_AMBULATORY_CARE_PROVIDER_SITE_OTHER): Payer: BC Managed Care – PPO | Admitting: Family Medicine

## 2011-03-05 DIAGNOSIS — M549 Dorsalgia, unspecified: Secondary | ICD-10-CM

## 2011-03-05 MED ORDER — CYCLOBENZAPRINE HCL 10 MG PO TABS: 5.0000 mg | ORAL_TABLET | Freq: Three times a day (TID) | ORAL | Status: AC | PRN

## 2011-03-05 NOTE — Progress Notes (Signed)
She missed a step going down stairs Sunday but caught herself on the way down.  Uncomfortable sitting/standing/laying down/walking.  R lower lumbar area sore.  No audible pop/snap. No other fall, trauma, LOC.  Heat in shower helps some, temporary.  Taking advil and using heating pad.  Planning on stopping mobic due to h/o anemia, monitored by GI.  No weakness in leg, no radicular sx.    Meds, vitals, and allergies reviewed.   ROS: See HPI.  Otherwise, noncontributory.  nad ncat rrr ctab Back w/o midline pain R SI tender on testing.  R lower paraspinal muscles ttp  R SLR neg, distally nv intact

## 2011-03-05 NOTE — Patient Instructions (Signed)
Take the flexeril as needed (sedation caution), stretch your lower back and use a heating pad.  Take care. This should gradually improve.

## 2011-03-06 DIAGNOSIS — M549 Dorsalgia, unspecified: Secondary | ICD-10-CM | POA: Insufficient documentation

## 2011-03-06 NOTE — Assessment & Plan Note (Signed)
Benign exam, likely muscle irritation and SI inflammation.  Use local heat, stretch and flexeril.  She'd like to avoid nsaids at this point.  She agrees with plan, sedation caution given on meds.  F/u prn.  No indication to image.  D/w pt.

## 2011-03-13 ENCOUNTER — Ambulatory Visit (HOSPITAL_COMMUNITY)
Admission: RE | Admit: 2011-03-13 | Discharge: 2011-03-13 | Disposition: A | Discharge status: Home / Self Care | Payer: BC Managed Care – PPO | Source: Ambulatory Visit | Attending: Obstetrics and Gynecology | Admitting: Obstetrics and Gynecology

## 2011-03-13 DIAGNOSIS — Z1231 Encounter for screening mammogram for malignant neoplasm of breast: Secondary | ICD-10-CM | POA: Insufficient documentation

## 2011-03-22 ENCOUNTER — Encounter: Payer: Self-pay | Admitting: Family Medicine

## 2011-03-22 ENCOUNTER — Ambulatory Visit (INDEPENDENT_AMBULATORY_CARE_PROVIDER_SITE_OTHER): Payer: BC Managed Care – PPO | Admitting: Family Medicine

## 2011-03-22 VITALS — BP 134/72 | HR 78 | Temp 98.2°F | Wt 150.0 lb

## 2011-03-22 DIAGNOSIS — M549 Dorsalgia, unspecified: Secondary | ICD-10-CM

## 2011-03-22 DIAGNOSIS — M5430 Sciatica, unspecified side: Secondary | ICD-10-CM

## 2011-03-22 MED ORDER — TRAMADOL HCL 50 MG PO TABS: 50.0000 mg | ORAL_TABLET | Freq: Four times a day (QID) | ORAL | Status: AC | PRN

## 2011-03-22 MED ORDER — MELOXICAM 15 MG PO TABS: 15.0000 mg | ORAL_TABLET | Freq: Every day | ORAL | Status: DC

## 2011-03-22 NOTE — Progress Notes (Signed)
Prev with R sided lower back pain.  Some better with time and flexeril.  Now (x3 days) with L sided pain, radiating down the leg (throught the lateral side of the leg).  Toes on L foot feel cold.  Pain lifting L leg to put on a pair of pants.  No other new falls, no trauma.  No FCNAV.  No weakness in legs but pain on movement/weight bearing on the left.    Off mobic for now.  Taking aleve w/o much relief.  The mobic had prev helped with headaches; HA returned while off mobic.    Meds, vitals, and allergies reviewed.   ROS: See HPI.  Otherwise, noncontributory.  nad but uncomfortable rrr ctab Back w/o midline pain but L SI area ttp and tender on testing L greater troch ttp and L SLR positive Distally nv intact with normal DTRs

## 2011-03-22 NOTE — Patient Instructions (Signed)
Stop the ibuprofen and aleve.  Take mobic once a day with food.  You can consider taking it with prilosec.  Take the tramadol for pain.  It can make you drowsy.  See Aram Beecham about your referral before you leave today.

## 2011-03-23 ENCOUNTER — Encounter: Payer: Self-pay | Admitting: Family Medicine

## 2011-03-23 NOTE — Assessment & Plan Note (Addendum)
Prev R sided sx improved.  Likely with L sided sx related to compensation for R side prev.  No indication for imaging at this point.  D/w pt.  Would use mobic, tramadol, flexeril and refer to PT.  She may need spine/ortho eval if refractory.  No red flag sx by history or exam.  She agrees with plan .

## 2011-06-09 ENCOUNTER — Telehealth: Payer: Self-pay | Admitting: Family Medicine

## 2011-06-09 DIAGNOSIS — D649 Anemia, unspecified: Secondary | ICD-10-CM

## 2011-06-09 DIAGNOSIS — Z0000 Encounter for general adult medical examination without abnormal findings: Secondary | ICD-10-CM

## 2011-06-09 NOTE — Telephone Encounter (Signed)
Message copied by Judy Pimple on Sun Jun 09, 2011  2:08 PM ------      Message from: Alvina Chou      Created: Tue Jun 04, 2011  2:46 PM      Regarding: Lab orders for 2.4.13       Patient is scheduled for CPX labs, please order future labs, Thanks , Camelia Eng

## 2011-06-10 ENCOUNTER — Other Ambulatory Visit (INDEPENDENT_AMBULATORY_CARE_PROVIDER_SITE_OTHER): Payer: BC Managed Care – PPO

## 2011-06-10 DIAGNOSIS — D649 Anemia, unspecified: Secondary | ICD-10-CM

## 2011-06-10 DIAGNOSIS — Z0000 Encounter for general adult medical examination without abnormal findings: Secondary | ICD-10-CM

## 2011-06-10 LAB — COMPREHENSIVE METABOLIC PANEL
ALT: 17 U/L (ref 0–35)
AST: 20 U/L (ref 0–37)
Albumin: 4 g/dL (ref 3.5–5.2)
BUN: 19 mg/dL (ref 6–23)
Calcium: 9.3 mg/dL (ref 8.4–10.5)
Chloride: 104 mEq/L (ref 96–112)
Potassium: 4.4 mEq/L (ref 3.5–5.1)
Sodium: 139 mEq/L (ref 135–145)
Total Protein: 7.3 g/dL (ref 6.0–8.3)

## 2011-06-10 LAB — CBC WITH DIFFERENTIAL/PLATELET
Basophils Relative: 0.3 % (ref 0.0–3.0)
Eosinophils Absolute: 0.1 10*3/uL (ref 0.0–0.7)
Lymphocytes Relative: 30.4 % (ref 12.0–46.0)
MCHC: 34 g/dL (ref 30.0–36.0)
Neutrophils Relative %: 57.9 % (ref 43.0–77.0)
RBC: 3.81 Mil/uL — ABNORMAL LOW (ref 3.87–5.11)
WBC: 5.5 10*3/uL (ref 4.5–10.5)

## 2011-06-10 LAB — TSH: TSH: 2.06 u[IU]/mL (ref 0.35–5.50)

## 2011-06-10 LAB — LDL CHOLESTEROL, DIRECT: Direct LDL: 102.9 mg/dL

## 2011-06-11 ENCOUNTER — Encounter: Payer: Self-pay | Admitting: Family Medicine

## 2011-06-12 ENCOUNTER — Encounter: Payer: Self-pay | Admitting: Family Medicine

## 2011-06-12 ENCOUNTER — Ambulatory Visit (INDEPENDENT_AMBULATORY_CARE_PROVIDER_SITE_OTHER): Payer: BC Managed Care – PPO | Admitting: Family Medicine

## 2011-06-12 VITALS — BP 118/64 | HR 72 | Temp 98.2°F | Ht 63.75 in | Wt 152.0 lb

## 2011-06-12 DIAGNOSIS — Z0000 Encounter for general adult medical examination without abnormal findings: Secondary | ICD-10-CM

## 2011-06-12 DIAGNOSIS — D649 Anemia, unspecified: Secondary | ICD-10-CM

## 2011-06-12 MED ORDER — MELOXICAM 15 MG PO TABS: 15.0000 mg | ORAL_TABLET | Freq: Every day | ORAL | Status: DC

## 2011-06-12 NOTE — Assessment & Plan Note (Addendum)
Reviewed health habits including diet and exercise and skin cancer prevention She works for [employer] and mt wt / also exercises  Also reviewed health mt list, fam hx and immunizations   Rev wellness labs - doing well  She will disc zoster vaccine with her opthy- given hx of zoster of the eye

## 2011-06-12 NOTE — Assessment & Plan Note (Signed)
Mild iron def with neg GI work ups Is controlled well with daily iron  Runs in the family Due for colonosc in 2014 Will continue to follow

## 2011-06-12 NOTE — Progress Notes (Signed)
Subjective:    Patient ID: Megan Harrison, female    DOB: 03-18-1956, 56 y.o.   MRN: [id number]  HPI Here for health maintenance exam and to review chronic medical problems   Is doing well overall   Hurt her back in the fall - went to PT  Is back in the gym    bp 118/64- good  Wt is up 2 lb with bmi of 26 Is exercising regularly  Eats very well and still works for [employer] -- very busy    Lipids Lab Results  Component Value Date   CHOL 202* 06/10/2011   CHOL 189 05/17/2010   CHOL 167 03/10/2009   Lab Results  Component Value Date   HDL 82.60 06/10/2011   HDL 78.20 05/17/2010   HDL 40.98 03/10/2009   Lab Results  Component Value Date   LDLCALC 100* 05/17/2010   LDLCALC 86 03/10/2009   LDLCALC 114* 03/02/2008   Lab Results  Component Value Date   TRIG 60.0 06/10/2011   TRIG 52.0 05/17/2010   TRIG 66.0 03/10/2009   Lab Results  Component Value Date   CHOLHDL 2 06/10/2011   CHOLHDL 2 05/17/2010   CHOLHDL 2 03/10/2009   Lab Results  Component Value Date   LDLDIRECT 102.9 06/10/2011   really good profile  Does stay away from bad / fatty foods   Pap--saw gyn in June - still does pap every other year  hyst in past HRT-- is aware of risks -- disc with Dr Tenny Craw every year (on estrace) Has not had a bone density test- no risk factors   mammo 11/12--normal  Self exam- no lumps or changes   colonosc 11/09-- recall 5 years for family history  Did endoscopy  Hx of anemia  fam hx - brother with colon cancer   Lab Results  Component Value Date   WBC 5.5 06/10/2011   HGB 12.1 06/10/2011   HCT 35.7* 06/10/2011   MCV 93.7 06/10/2011   PLT 307.0 06/10/2011   is on iron and doing well   Patient Active Problem List  Diagnoses  . HERPES ZOSTER OPHTHALMICUS  . ALLERGIC RHINITIS  . MIGRAINES, HX OF  . UNSPECIFIED ANEMIA  . Back pain  . Routine general medical examination at a health care facility   Past Medical History  Diagnosis Date  . Anemia   . Cutaneous sarcoidosis   .  Migraine   . Foot pain   . Zoster     Opthy (regular checks)   Past Surgical History  Procedure Date  . Tonsillectomy   . Cesarean section     two times  . Colonoscopy 11/0/2009    diverticulosis and hemorrhoids  . Upper gastrointestinal endoscopy 11/15/2010    non-erosive gastritis, H. pylori negative  . Abdominal hysterectomy 11/99  . Arm lesion, cutaneous sarcoid 12/2005    with normal CXR and ANA  . 2d echo 1999    Negative   History  Substance Use Topics  . Smoking status: Never Smoker   . Smokeless tobacco: Not on file  . Alcohol Use: No   Family History  Problem Relation Age of Onset  . Colon cancer Brother   . Heart disease Mother     CAD  . Alcohol abuse Mother   . Lung cancer Father   . Heart disease Brother     CAD  . Heart disease Brother     CAD, pre diabetic, obese  . Cancer Brother  Colon CA, liver mets at 62, DM   Allergies  Allergen Reactions  . Codeine     REACTION: syncope  . Sumatriptan     REACTION: choking sensation   Current Outpatient Prescriptions on File Prior to Visit  Medication Sig Dispense Refill  . aspirin 81 MG tablet Take 160 mg by mouth daily.      . Calcium Carbonate (CALCIUM 500 PO) Take 1 capsule by mouth daily.        Marland Kitchen estradiol (ESTRACE) 1 MG tablet       . ferrous sulfate 325 (65 FE) MG EC tablet 1 tablet by mouth daily  30 tablet  0  . fluticasone (FLONASE) 50 MCG/ACT nasal spray Place 2 sprays into the nose daily.        . Multiple Vitamins-Minerals (ONE-A-DAY WOMENS 50 PLUS PO) Take 1 tablet by mouth daily.        . prednisoLONE acetate (PRED FORTE) 1 % ophthalmic suspension Place 1 drop into the left eye 3 (three) times daily.       . bimatoprost (LUMIGAN) 0.01 % SOLN Place one drop in the left eye at bedtime.       . cyclobenzaprine (FLEXERIL) 10 MG tablet Take 0.5-1 tablets (5-10 mg total) by mouth every 8 (eight) hours as needed for muscle spasms (sedation caution).  30 tablet  1  . estrogens, conjugated,  (PREMARIN) 0.45 MG tablet Take 0.45 mg by mouth daily. Take daily for 21 days then do not take for 7 days.      . traMADol (ULTRAM) 50 MG tablet Take 1-2 tablets (50-100 mg total) by mouth every 6 (six) hours as needed for pain. Maximum dose= 8 tablets per day  40 tablet  1      Review of Systems Review of Systems  Constitutional: Negative for fever, appetite change, fatigue and unexpected weight change.  Eyes: Negative for pain and visual disturbance.  Respiratory: Negative for cough and shortness of breath.   Cardiovascular: Negative for cp or palpitations    Gastrointestinal: Negative for nausea, diarrhea and constipation.  Genitourinary: Negative for urgency and frequency.  Skin: Negative for pallor or rash   Neurological: Negative for weakness, light-headedness, numbness and headaches.  Hematological: Negative for adenopathy. Does not bruise/bleed easily.  Psychiatric/Behavioral: Negative for dysphoric mood. The patient is not nervous/anxious.          Objective:   Physical Exam  Constitutional: She appears well-developed and well-nourished. No distress.  HENT:  Head: Normocephalic and atraumatic.  Right Ear: External ear normal.  Left Ear: External ear normal.  Nose: Nose normal.  Mouth/Throat: Oropharynx is clear and moist.  Eyes: Conjunctivae and EOM are normal. Pupils are equal, round, and reactive to light. No scleral icterus.  Neck: Normal range of motion. Neck supple. No JVD present. Carotid bruit is not present. No thyromegaly present.  Cardiovascular: Normal rate, regular rhythm, normal heart sounds and intact distal pulses.  Exam reveals no gallop.   Pulmonary/Chest: Effort normal and breath sounds normal. No respiratory distress. She has no wheezes.  Abdominal: Soft. Bowel sounds are normal. She exhibits no distension, no abdominal bruit and no mass. There is no tenderness.  Musculoskeletal: Normal range of motion. She exhibits no edema and no tenderness.    Lymphadenopathy:    She has no cervical adenopathy.  Neurological: She is alert. She has normal reflexes. No cranial nerve deficit. She exhibits normal muscle tone. Coordination normal.  Skin: Skin is warm and dry. No rash  noted. No erythema. No pallor.  Psychiatric: She has a normal mood and affect.          Assessment & Plan:

## 2011-06-12 NOTE — Patient Instructions (Signed)
Keep up the good work with healthy diet and exercise  Labs look good  Talk to your eye doctor in the future about the shingles vaccine  I sent mobic to your pharmacy

## 2011-07-21 ENCOUNTER — Other Ambulatory Visit: Payer: Self-pay | Admitting: Family Medicine

## 2011-07-22 NOTE — Telephone Encounter (Signed)
[facility] [address] request refill flonase 50 mcg/act nasal spray with instructions one spray each nostril daily. On our med list it was listed at last visit 06/12/11 two sprays each nostril daily. I spoke with pt and she said she uses one spray each nostril daily. Dr Milinda Antis has not been on computer since she left the office sick today. Pt is out of med and plans on picking up refill on 07/23/11. Would you please advise. Thank you.

## 2011-07-23 NOTE — Telephone Encounter (Signed)
Sent!

## 2011-07-23 NOTE — Telephone Encounter (Signed)
Patient notified via telephone, Rx sent to pharmacy. 

## 2012-02-24 ENCOUNTER — Other Ambulatory Visit (HOSPITAL_COMMUNITY): Payer: Self-pay | Admitting: Obstetrics and Gynecology

## 2012-02-24 DIAGNOSIS — Z1231 Encounter for screening mammogram for malignant neoplasm of breast: Secondary | ICD-10-CM

## 2012-03-18 ENCOUNTER — Ambulatory Visit (HOSPITAL_COMMUNITY): Payer: BC Managed Care – PPO

## 2012-03-18 ENCOUNTER — Ambulatory Visit (HOSPITAL_COMMUNITY)
Admission: RE | Admit: 2012-03-18 | Discharge: 2012-03-18 | Disposition: A | Discharge status: Home / Self Care | Payer: BC Managed Care – PPO | Source: Ambulatory Visit | Attending: Obstetrics and Gynecology | Admitting: Obstetrics and Gynecology

## 2012-03-18 DIAGNOSIS — Z1231 Encounter for screening mammogram for malignant neoplasm of breast: Secondary | ICD-10-CM | POA: Insufficient documentation

## 2012-06-14 ENCOUNTER — Other Ambulatory Visit: Payer: Self-pay | Admitting: Family Medicine

## 2012-06-15 NOTE — Telephone Encounter (Signed)
Ok to refill 

## 2012-06-15 NOTE — Telephone Encounter (Signed)
Please give 6 months of refils

## 2012-06-15 NOTE — Telephone Encounter (Signed)
done

## 2012-08-24 ENCOUNTER — Other Ambulatory Visit: Payer: Self-pay | Admitting: *Deleted

## 2012-08-24 MED ORDER — FLUTICASONE PROPIONATE 50 MCG/ACT NA SUSP: 1.0000 | Freq: Every day | NASAL | Status: DC

## 2012-10-15 ENCOUNTER — Telehealth: Payer: Self-pay | Admitting: Family Medicine

## 2012-10-15 DIAGNOSIS — Z0000 Encounter for general adult medical examination without abnormal findings: Secondary | ICD-10-CM

## 2012-10-15 NOTE — Telephone Encounter (Signed)
Message copied by Judy Pimple on Thu Oct 15, 2012  8:17 PM ------      Message from: Alvina Chou      Created: Thu Oct 08, 2012  2:31 PM      Regarding: Lab orders for Friday, 6.13.14       Patient is scheduled for CPX labs, please order future labs, Thanks , Terri       ------

## 2012-10-15 NOTE — Telephone Encounter (Signed)
Future labs 

## 2012-10-16 ENCOUNTER — Other Ambulatory Visit (INDEPENDENT_AMBULATORY_CARE_PROVIDER_SITE_OTHER): Payer: BC Managed Care – PPO

## 2012-10-16 DIAGNOSIS — D649 Anemia, unspecified: Secondary | ICD-10-CM

## 2012-10-16 DIAGNOSIS — Z0000 Encounter for general adult medical examination without abnormal findings: Secondary | ICD-10-CM

## 2012-10-16 LAB — LIPID PANEL
Cholesterol: 186 mg/dL (ref 0–200)
LDL Cholesterol: 103 mg/dL — ABNORMAL HIGH (ref 0–99)
Total CHOL/HDL Ratio: 3
Triglycerides: 76 mg/dL (ref 0.0–149.0)

## 2012-10-16 LAB — COMPREHENSIVE METABOLIC PANEL
AST: 20 U/L (ref 0–37)
Albumin: 3.5 g/dL (ref 3.5–5.2)
BUN: 19 mg/dL (ref 6–23)
Calcium: 9.2 mg/dL (ref 8.4–10.5)
Chloride: 107 mEq/L (ref 96–112)
Glucose, Bld: 82 mg/dL (ref 70–99)
Potassium: 4 mEq/L (ref 3.5–5.1)
Sodium: 141 mEq/L (ref 135–145)
Total Protein: 6.6 g/dL (ref 6.0–8.3)

## 2012-10-16 LAB — CBC WITH DIFFERENTIAL/PLATELET
Basophils Relative: 0.3 % (ref 0.0–3.0)
Eosinophils Relative: 2.6 % (ref 0.0–5.0)
Lymphocytes Relative: 32.5 % (ref 12.0–46.0)
Neutrophils Relative %: 54.4 % (ref 43.0–77.0)
Platelets: 290 10*3/uL (ref 150.0–400.0)
RBC: 3.72 Mil/uL — ABNORMAL LOW (ref 3.87–5.11)
WBC: 6.6 10*3/uL (ref 4.5–10.5)

## 2012-10-19 ENCOUNTER — Encounter: Payer: Self-pay | Admitting: Family Medicine

## 2012-10-19 ENCOUNTER — Ambulatory Visit (INDEPENDENT_AMBULATORY_CARE_PROVIDER_SITE_OTHER): Payer: BC Managed Care – PPO | Admitting: Family Medicine

## 2012-10-19 VITALS — BP 126/68 | HR 67 | Temp 98.6°F | Ht 63.75 in | Wt 152.5 lb

## 2012-10-19 DIAGNOSIS — Z0000 Encounter for general adult medical examination without abnormal findings: Secondary | ICD-10-CM

## 2012-10-19 DIAGNOSIS — D649 Anemia, unspecified: Secondary | ICD-10-CM

## 2012-10-19 MED ORDER — FLUTICASONE PROPIONATE 50 MCG/ACT NA SUSP: 1.0000 | Freq: Every day | NASAL | Status: DC

## 2012-10-19 NOTE — Assessment & Plan Note (Signed)
Mild / chronic and neg GI w/u Continue to follow On iron daily

## 2012-10-19 NOTE — Progress Notes (Signed)
Subjective:    Patient ID: Megan Harrison, female    DOB: October 07, 1955, 57 y.o.   MRN: [id number]  HPI Here for health maintenance exam and to review chronic medical problems    Overall doing very well   Wt is stable with bmi of 26   Flu vaccine 9/13 colonosc 11/09-will be due in nov for 5 year recall  No bowel change   Pap 6/12 Has gyn appt next month- still gets pap smears  Had hyst  mammo 11/13 Self exam-no lumps or changes Gyn does her exam   Td 4/06  Mood - is chipper and she is not depressed    Hx of chronic anemia mild with neg GI w/u Lab Results  Component Value Date   WBC 6.6 10/16/2012   HGB 11.7* 10/16/2012   HCT 34.9* 10/16/2012   MCV 93.8 10/16/2012   PLT 290.0 10/16/2012   chronic  Takes iron  No longer donates blood - fainted once afterwards   Lab Results  Component Value Date   CHOL 186 10/16/2012   CHOL 202* 06/10/2011   CHOL 189 05/17/2010   Lab Results  Component Value Date   HDL 67.40 10/16/2012   HDL 40.98 06/10/2011   HDL 78.20 05/17/2010   Lab Results  Component Value Date   LDLCALC 103* 10/16/2012   LDLCALC 100* 05/17/2010   LDLCALC 86 03/10/2009   Lab Results  Component Value Date   TRIG 76.0 10/16/2012   TRIG 60.0 06/10/2011   TRIG 52.0 05/17/2010   Lab Results  Component Value Date   CHOLHDL 3 10/16/2012   CHOLHDL 2 06/10/2011   CHOLHDL 2 05/17/2010   Lab Results  Component Value Date   LDLDIRECT 102.9 06/10/2011    HDL is down a bit but ratio is still good  Eating a bit worse lately - and less exercise  Is back "on the wagon"  Patient Active Problem List   Diagnosis Date Noted  . Routine general medical examination at a health care facility 06/09/2011  . Back pain 03/06/2011  . UNSPECIFIED ANEMIA 05/24/2010  . ALLERGIC RHINITIS 03/10/2009  . HERPES ZOSTER OPHTHALMICUS 08/28/2006  . MIGRAINES, HX OF 08/28/2006   Past Medical History  Diagnosis Date  . Anemia   . Cutaneous sarcoidosis   . Migraine   . Foot pain   . Zoster      Opthy (regular checks)   Past Surgical History  Procedure Laterality Date  . Tonsillectomy    . Cesarean section      two times  . Colonoscopy  11/0/2009    diverticulosis and hemorrhoids  . Upper gastrointestinal endoscopy  11/15/2010    non-erosive gastritis, H. pylori negative  . Abdominal hysterectomy  11/99  . Arm lesion, cutaneous sarcoid  12/2005    with normal CXR and ANA  . 2d echo  1999    Negative   History  Substance Use Topics  . Smoking status: Never Smoker   . Smokeless tobacco: Not on file  . Alcohol Use: No   Family History  Problem Relation Age of Onset  . Colon cancer Brother   . Heart disease Mother     CAD  . Alcohol abuse Mother   . Lung cancer Father   . Heart disease Brother     CAD  . Heart disease Brother     CAD, pre diabetic, obese  . Cancer Brother     Colon CA, liver mets at 8, DM   Allergies  Allergen Reactions  . Codeine     REACTION: syncope  . Sumatriptan     REACTION: choking sensation   Current Outpatient Prescriptions on File Prior to Visit  Medication Sig Dispense Refill  . aspirin 81 MG tablet Take 81 mg by mouth daily.       . Calcium Carbonate (CALCIUM 500 PO) Take 1 capsule by mouth daily.        Marland Kitchen estradiol (ESTRACE) 1 MG tablet       . estrogens, conjugated, (PREMARIN) 0.45 MG tablet Take 0.45 mg by mouth daily. Take daily for 21 days then do not take for 7 days.      . ferrous sulfate 325 (65 FE) MG EC tablet 1 tablet by mouth daily  30 tablet  0  . fluticasone (FLONASE) 50 MCG/ACT nasal spray Place 1 spray into the nose daily.  16 g  10  . meloxicam (MOBIC) 15 MG tablet TAKE ONE TABLET BY MOUTH EVERY DAY  90 tablet  1  . Multiple Vitamins-Minerals (ONE-A-DAY WOMENS 50 PLUS PO) Take 1 tablet by mouth daily.        . prednisoLONE acetate (PRED FORTE) 1 % ophthalmic suspension Place 1 drop into the left eye 3 (three) times daily.        No current facility-administered medications on file prior to visit.      Review of Systems Review of Systems  Constitutional: Negative for fever, appetite change, fatigue and unexpected weight change.  Eyes: Negative for pain and visual disturbance.  Respiratory: Negative for cough and shortness of breath.   Cardiovascular: Negative for cp or palpitations    Gastrointestinal: Negative for nausea, diarrhea and constipation.  Genitourinary: Negative for urgency and frequency.  Skin: Negative for pallor or rash   Neurological: Negative for weakness, light-headedness, numbness and headaches.  Hematological: Negative for adenopathy. Does not bruise/bleed easily.  Psychiatric/Behavioral: Negative for dysphoric mood. The patient is not nervous/anxious.         Objective:   Physical Exam  Constitutional: She appears well-developed and well-nourished. No distress.  overwt and well appearing   HENT:  Head: Normocephalic and atraumatic.  Right Ear: External ear normal.  Left Ear: External ear normal.  Nose: Nose normal.  Mouth/Throat: Oropharynx is clear and moist.  Eyes: Conjunctivae and EOM are normal. Pupils are equal, round, and reactive to light. Right eye exhibits no discharge. Left eye exhibits no discharge. No scleral icterus.  Neck: Normal range of motion. Neck supple. No JVD present. Carotid bruit is not present. No thyromegaly present.  Cardiovascular: Normal rate, regular rhythm, normal heart sounds and intact distal pulses.  Exam reveals no gallop.   Pulmonary/Chest: Effort normal and breath sounds normal. No respiratory distress. She has no wheezes. She exhibits no tenderness.  Abdominal: Soft. Bowel sounds are normal. She exhibits no distension, no abdominal bruit and no mass. There is no tenderness.  Musculoskeletal: She exhibits no edema and no tenderness.  Lymphadenopathy:    She has no cervical adenopathy.  Neurological: She is alert. She has normal reflexes. No cranial nerve deficit. She exhibits normal muscle tone. Coordination normal.   Skin: Skin is warm and dry. No rash noted. No erythema. No pallor.  Psychiatric: She has a normal mood and affect.          Assessment & Plan:

## 2012-10-19 NOTE — Assessment & Plan Note (Signed)
Reviewed health habits including diet and exercise and skin cancer prevention Also reviewed health mt list, fam hx and immunizations   Rev wellness labs in detail  Will see gyn for annual exam soon Due colonosc in nov for 5 year recall Mood is good  Enc to keep taking good care of herself

## 2012-10-19 NOTE — Patient Instructions (Addendum)
I think you will be due for a colonoscopy in November- let us know if you do not get a reminder in the mail  Take care of yourself  Labs are ok  Mildly anemic- this is not new  Follow up with your gyn doctor as planned

## 2012-11-04 ENCOUNTER — Other Ambulatory Visit: Payer: Self-pay | Admitting: Obstetrics and Gynecology

## 2012-11-09 ENCOUNTER — Other Ambulatory Visit: Payer: Self-pay | Admitting: Family Medicine

## 2012-11-09 NOTE — Telephone Encounter (Signed)
Electronic refill request, please advise  

## 2012-11-09 NOTE — Telephone Encounter (Signed)
It looks too early to refill this-please check on that

## 2012-11-11 NOTE — Telephone Encounter (Signed)
Rx to early not due for refill until 12/12/12 so it's a month early, I declined refill request

## 2012-12-19 ENCOUNTER — Other Ambulatory Visit: Payer: Self-pay | Admitting: Family Medicine

## 2012-12-21 NOTE — Telephone Encounter (Signed)
done

## 2012-12-21 NOTE — Telephone Encounter (Signed)
Electronic refill request, please advise  

## 2012-12-21 NOTE — Telephone Encounter (Signed)
Please refill for 12 mo 

## 2013-02-09 ENCOUNTER — Other Ambulatory Visit (HOSPITAL_COMMUNITY): Payer: Self-pay | Admitting: Obstetrics and Gynecology

## 2013-02-09 DIAGNOSIS — Z1231 Encounter for screening mammogram for malignant neoplasm of breast: Secondary | ICD-10-CM

## 2013-03-19 ENCOUNTER — Ambulatory Visit (HOSPITAL_COMMUNITY): Payer: BC Managed Care – PPO

## 2013-03-22 ENCOUNTER — Emergency Department (HOSPITAL_COMMUNITY)
Admission: EM | Admit: 2013-03-22 | Discharge: 2013-03-22 | Disposition: A | Discharge status: Home / Self Care | Payer: BC Managed Care – PPO | Attending: Emergency Medicine | Admitting: Emergency Medicine

## 2013-03-22 ENCOUNTER — Encounter (HOSPITAL_COMMUNITY): Payer: Self-pay | Admitting: Emergency Medicine

## 2013-03-22 DIAGNOSIS — Z885 Allergy status to narcotic agent status: Secondary | ICD-10-CM | POA: Insufficient documentation

## 2013-03-22 DIAGNOSIS — Z8619 Personal history of other infectious and parasitic diseases: Secondary | ICD-10-CM | POA: Insufficient documentation

## 2013-03-22 DIAGNOSIS — Z79899 Other long term (current) drug therapy: Secondary | ICD-10-CM | POA: Insufficient documentation

## 2013-03-22 DIAGNOSIS — IMO0002 Reserved for concepts with insufficient information to code with codable children: Secondary | ICD-10-CM | POA: Insufficient documentation

## 2013-03-22 DIAGNOSIS — M62838 Other muscle spasm: Secondary | ICD-10-CM | POA: Insufficient documentation

## 2013-03-22 DIAGNOSIS — Z7982 Long term (current) use of aspirin: Secondary | ICD-10-CM | POA: Insufficient documentation

## 2013-03-22 DIAGNOSIS — M5430 Sciatica, unspecified side: Secondary | ICD-10-CM | POA: Insufficient documentation

## 2013-03-22 DIAGNOSIS — Z8669 Personal history of other diseases of the nervous system and sense organs: Secondary | ICD-10-CM | POA: Insufficient documentation

## 2013-03-22 DIAGNOSIS — D649 Anemia, unspecified: Secondary | ICD-10-CM | POA: Insufficient documentation

## 2013-03-22 DIAGNOSIS — M5432 Sciatica, left side: Secondary | ICD-10-CM

## 2013-03-22 DIAGNOSIS — Z888 Allergy status to other drugs, medicaments and biological substances status: Secondary | ICD-10-CM | POA: Insufficient documentation

## 2013-03-22 DIAGNOSIS — M62830 Muscle spasm of back: Secondary | ICD-10-CM

## 2013-03-22 HISTORY — DX: Reserved for concepts with insufficient information to code with codable children: IMO0002

## 2013-03-22 MED ORDER — DIAZEPAM 5 MG/ML IJ SOLN
5.0000 mg | Freq: Once | INTRAMUSCULAR | Status: AC
  Administered 2013-03-22: 5 mg via INTRAVENOUS
  Filled 2013-03-22: qty 2

## 2013-03-22 MED ORDER — PREDNISONE 20 MG PO TABS: ORAL_TABLET | ORAL | Status: DC

## 2013-03-22 MED ORDER — HYDROMORPHONE HCL PF 1 MG/ML IJ SOLN
1.0000 mg | Freq: Once | INTRAMUSCULAR | Status: AC
  Administered 2013-03-22: 0.5 mg via INTRAVENOUS
  Filled 2013-03-22: qty 1

## 2013-03-22 MED ORDER — ONDANSETRON HCL 4 MG/2ML IJ SOLN
4.0000 mg | Freq: Once | INTRAMUSCULAR | Status: AC
  Administered 2013-03-22: 4 mg via INTRAVENOUS
  Filled 2013-03-22: qty 2

## 2013-03-22 MED ORDER — HYDROCODONE-ACETAMINOPHEN 5-325 MG PO TABS: 2.0000 | ORAL_TABLET | ORAL | Status: DC | PRN

## 2013-03-22 MED ORDER — DIAZEPAM 5 MG PO TABS: 5.0000 mg | ORAL_TABLET | Freq: Three times a day (TID) | ORAL | Status: DC | PRN

## 2013-03-22 NOTE — ED Notes (Signed)
Per EMS pt here with pain radiating from lower back down left leg.  Pt describes pain as tightness and throbbing in nature.  No numbness or tingling reported.  No changes in bladder or bowel movements.  Sts pain started a week ago when she was helping someone move.  Pt given of Fentanyl by EMS.  CMS intact.

## 2013-03-22 NOTE — ED Provider Notes (Signed)
CSN: [id number]     Arrival date & time 03/22/13  2044 History   First MD Initiated Contact with Patient 03/22/13 2045     Chief Complaint  Patient presents with  . Back Pain   (Consider location/radiation/quality/duration/timing/severity/associated sxs/prior Treatment) HPI Comments: Patient presents to ER for evaluation of low back pain. Patient reports that she started having low back pain week ago when she was moving furniture. She said it improved with rest and a heating pad. Today, however, she bent over and felt sudden sharp pain in the lower back. Pain has been severe, constant and radiates down the leg. No incontinence or change in bowel or bladder function. No loss of strength or sensation.  Patient is a 57 y.o. female presenting with back pain.  Back Pain   Past Medical History  Diagnosis Date  . Anemia   . Cutaneous sarcoidosis   . Migraine   . Foot pain   . Zoster     Opthy (regular checks)  . Degenerative disc disease    Past Surgical History  Procedure Laterality Date  . Tonsillectomy    . Cesarean section      two times  . Colonoscopy  11/0/2009    diverticulosis and hemorrhoids  . Upper gastrointestinal endoscopy  11/15/2010    non-erosive gastritis, H. pylori negative  . Abdominal hysterectomy  11/99  . Arm lesion, cutaneous sarcoid  12/2005    with normal CXR and ANA  . 2d echo  1999    Negative   Family History  Problem Relation Age of Onset  . Colon cancer Brother   . Heart disease Mother     CAD  . Alcohol abuse Mother   . Lung cancer Father   . Heart disease Brother     CAD  . Heart disease Brother     CAD, pre diabetic, obese  . Cancer Brother     Colon CA, liver mets at 45, DM   History  Substance Use Topics  . Smoking status: Never Smoker   . Smokeless tobacco: Not on file  . Alcohol Use: No   OB History   Grav Para Term Preterm Abortions TAB SAB Ect Mult Living                 Review of Systems  Genitourinary: Negative.    Musculoskeletal: Positive for back pain.  All other systems reviewed and are negative.    Allergies  Codeine and Sumatriptan  Home Medications   Current Outpatient Rx  Name  Route  Sig  Dispense  Refill  . aspirin 81 MG tablet   Oral   Take 81 mg by mouth daily.          . Brinzolamide-Brimonidine (SIMBRINZA) 1-0.2 % SUSP   Left Eye   Place 1 drop into the left eye daily.         . Calcium Carbonate (CALCIUM 500 PO)   Oral   Take 1 capsule by mouth daily.           Marland Kitchen estradiol (ESTRACE) 1 MG tablet               . ferrous sulfate 325 (65 FE) MG EC tablet      1 tablet by mouth daily   30 tablet   0   . fluticasone (FLONASE) 50 MCG/ACT nasal spray   Nasal   Place 1 spray into the nose daily.   16 g   11   .  L-Lysine 1000 MG TABS   Oral   Take 1 tablet by mouth daily.         . meloxicam (MOBIC) 15 MG tablet      TAKE ONE TABLET BY MOUTH EVERY DAY   90 tablet   3   . Multiple Vitamins-Minerals (ONE-A-DAY WOMENS 50 PLUS PO)   Oral   Take 1 tablet by mouth daily.           . prednisoLONE acetate (PRED FORTE) 1 % ophthalmic suspension   Left Eye   Place 1 drop into the left eye 3 (three) times daily.           BP 150/61  Pulse 83  Temp(Src) 98.1 F (36.7 C) (Oral)  Resp 18  Ht 5\' 3"  (1.6 m)  Wt 146 lb (66.225 kg)  BMI 25.87 kg/m2  SpO2 99% Physical Exam  Constitutional: She is oriented to person, place, and time. She appears well-developed and well-nourished. No distress.  HENT:  Head: Normocephalic and atraumatic.  Right Ear: Hearing normal.  Left Ear: Hearing normal.  Nose: Nose normal.  Mouth/Throat: Oropharynx is clear and moist and mucous membranes are normal.  Eyes: Conjunctivae and EOM are normal. Pupils are equal, round, and reactive to light.  Neck: Normal range of motion. Neck supple.  Cardiovascular: Regular rhythm, S1 normal and S2 normal.  Exam reveals no gallop and no friction rub.   No murmur  heard. Pulmonary/Chest: Effort normal and breath sounds normal. No respiratory distress. She exhibits no tenderness.  Abdominal: Soft. Normal appearance and bowel sounds are normal. There is no hepatosplenomegaly. There is no tenderness. There is no rebound, no guarding, no tenderness at McBurney's point and negative Murphy's sign. No hernia.  Musculoskeletal: Normal range of motion.       Lumbar back: She exhibits tenderness.  Neurological: She is alert and oriented to person, place, and time. She has normal strength. No cranial nerve deficit or sensory deficit. Coordination normal. GCS eye subscore is 4. GCS verbal subscore is 5. GCS motor subscore is 6.  Reflex Scores:      Patellar reflexes are 2+ on the right side and 2+ on the left side. Skin: Skin is warm, dry and intact. No rash noted. No cyanosis.  Psychiatric: She has a normal mood and affect. Her speech is normal and behavior is normal. Thought content normal.    ED Course  Procedures (including critical care time) Labs Review Labs Reviewed - No data to display Imaging Review No results found.  EKG Interpretation   None       MDM   1. Sciatica of left side   2. Muscle spasm of back    Presents to the ER with complaints of low back pain. Initially started having low back pain after moving furniture a week ago. Today after beginning at sudden worsening of the pain. Presents with obvious muscle spasms in the left side of her back. She has some radiation of pain to the back and leg region, but neurologic exam is normal. Symptoms significantly improved after Valium. Patient now comfortable. Will be prescribed Vicodin, Valium and prednisone. Followup with primary doctor in one week, return if symptoms worsen.    Gilda Crease, MD 03/22/13 2211

## 2013-03-25 ENCOUNTER — Ambulatory Visit (HOSPITAL_COMMUNITY): Payer: BC Managed Care – PPO

## 2013-03-25 ENCOUNTER — Emergency Department (HOSPITAL_COMMUNITY): Payer: BC Managed Care – PPO

## 2013-03-25 ENCOUNTER — Encounter (HOSPITAL_COMMUNITY): Payer: Self-pay | Admitting: Emergency Medicine

## 2013-03-25 ENCOUNTER — Ambulatory Visit: Payer: BC Managed Care – PPO | Admitting: Internal Medicine

## 2013-03-25 ENCOUNTER — Emergency Department (HOSPITAL_COMMUNITY)
Admission: EM | Admit: 2013-03-25 | Discharge: 2013-03-25 | Disposition: A | Discharge status: Home / Self Care | Payer: BC Managed Care – PPO | Attending: Emergency Medicine | Admitting: Emergency Medicine

## 2013-03-25 DIAGNOSIS — Z8619 Personal history of other infectious and parasitic diseases: Secondary | ICD-10-CM | POA: Insufficient documentation

## 2013-03-25 DIAGNOSIS — D649 Anemia, unspecified: Secondary | ICD-10-CM | POA: Insufficient documentation

## 2013-03-25 DIAGNOSIS — Z8679 Personal history of other diseases of the circulatory system: Secondary | ICD-10-CM | POA: Insufficient documentation

## 2013-03-25 DIAGNOSIS — M549 Dorsalgia, unspecified: Secondary | ICD-10-CM | POA: Insufficient documentation

## 2013-03-25 DIAGNOSIS — Z7982 Long term (current) use of aspirin: Secondary | ICD-10-CM | POA: Insufficient documentation

## 2013-03-25 DIAGNOSIS — IMO0002 Reserved for concepts with insufficient information to code with codable children: Secondary | ICD-10-CM | POA: Insufficient documentation

## 2013-03-25 LAB — CBC WITH DIFFERENTIAL/PLATELET
Basophils Relative: 0 % (ref 0–1)
Eosinophils Relative: 0 % (ref 0–5)
HCT: 36.1 % (ref 36.0–46.0)
Lymphocytes Relative: 6 % — ABNORMAL LOW (ref 12–46)
Lymphs Abs: 1.2 10*3/uL (ref 0.7–4.0)
MCH: 31.7 pg (ref 26.0–34.0)
MCHC: 34.3 g/dL (ref 30.0–36.0)
MCV: 92.3 fL (ref 78.0–100.0)
Monocytes Absolute: 1.4 10*3/uL — ABNORMAL HIGH (ref 0.1–1.0)
Monocytes Relative: 6 % (ref 3–12)
Platelets: 360 10*3/uL (ref 150–400)
RBC: 3.91 MIL/uL (ref 3.87–5.11)
RDW: 13.5 % (ref 11.5–15.5)
WBC: 22 10*3/uL — ABNORMAL HIGH (ref 4.0–10.5)

## 2013-03-25 LAB — BASIC METABOLIC PANEL
BUN: 25 mg/dL — ABNORMAL HIGH (ref 6–23)
CO2: 27 mEq/L (ref 19–32)
Calcium: 9 mg/dL (ref 8.4–10.5)
Creatinine, Ser: 0.59 mg/dL (ref 0.50–1.10)
GFR calc non Af Amer: >90 mL/min (ref >90)
Glucose, Bld: 115 mg/dL — ABNORMAL HIGH (ref 70–99)
Sodium: 142 mEq/L (ref 135–145)

## 2013-03-25 LAB — URINALYSIS, ROUTINE W REFLEX MICROSCOPIC
Glucose, UA: NEGATIVE mg/dL
Hgb urine dipstick: NEGATIVE
Ketones, ur: NEGATIVE mg/dL
Protein, ur: NEGATIVE mg/dL
Specific Gravity, Urine: 1.01 (ref 1.005–1.030)

## 2013-03-25 LAB — URINE MICROSCOPIC-ADD ON

## 2013-03-25 MED ORDER — OXYCODONE-ACETAMINOPHEN 5-325 MG PO TABS: 2.0000 | ORAL_TABLET | ORAL | Status: DC | PRN

## 2013-03-25 MED ORDER — CYCLOBENZAPRINE HCL 10 MG PO TABS: 10.0000 mg | ORAL_TABLET | Freq: Two times a day (BID) | ORAL | Status: DC | PRN

## 2013-03-25 MED ORDER — DIAZEPAM 5 MG/ML IJ SOLN
2.5000 mg | Freq: Once | INTRAMUSCULAR | Status: AC
  Administered 2013-03-25: 2.5 mg via INTRAVENOUS
  Filled 2013-03-25: qty 2

## 2013-03-25 MED ORDER — HYDROMORPHONE HCL PF 1 MG/ML IJ SOLN
1.0000 mg | Freq: Once | INTRAMUSCULAR | Status: AC
  Administered 2013-03-25: 1 mg via INTRAVENOUS
  Filled 2013-03-25: qty 1

## 2013-03-25 NOTE — ED Provider Notes (Signed)
CSN: [id number]     Arrival date & time 03/25/13  1233 History   First MD Initiated Contact with Patient 03/25/13 1236     Chief Complaint  Patient presents with  . Back Pain   (Consider location/radiation/quality/duration/timing/severity/associated sxs/prior Treatment) HPI Comments: Patient is a 57 year old female who presents with a 4 day history of back pain. Patient reports injuring her back 4 days ago when she bent over to pick something up and had sudden onset of pain in her low back that radiated down her left leg. Patient was seen in the ED that day and treated. She was prescribed pain medication. She was getting dressed to see her doctor this morning and experienced the same thing. She said it was so debilitating this morning that she could not move. She states having to crawl to the door to let her friend in to help her. No aggravating/alleviating factors. No other associated symptoms or injury.   Patient is a 57 y.o. female presenting with back pain.  Back Pain   Past Medical History  Diagnosis Date  . Anemia   . Cutaneous sarcoidosis   . Migraine   . Foot pain   . Zoster     Opthy (regular checks)  . Degenerative disc disease    Past Surgical History  Procedure Laterality Date  . Tonsillectomy    . Cesarean section      two times  . Colonoscopy  11/0/2009    diverticulosis and hemorrhoids  . Upper gastrointestinal endoscopy  11/15/2010    non-erosive gastritis, H. pylori negative  . Abdominal hysterectomy  11/99  . Arm lesion, cutaneous sarcoid  12/2005    with normal CXR and ANA  . 2d echo  1999    Negative   Family History  Problem Relation Age of Onset  . Colon cancer Brother   . Heart disease Mother     CAD  . Alcohol abuse Mother   . Lung cancer Father   . Heart disease Brother     CAD  . Heart disease Brother     CAD, pre diabetic, obese  . Cancer Brother     Colon CA, liver mets at 74, DM   History  Substance Use Topics  . Smoking status:  Never Smoker   . Smokeless tobacco: Not on file  . Alcohol Use: No   OB History   Grav Para Term Preterm Abortions TAB SAB Ect Mult Living                 Review of Systems  Musculoskeletal: Positive for back pain.  All other systems reviewed and are negative.    Allergies  Codeine and Sumatriptan  Home Medications   Current Outpatient Rx  Name  Route  Sig  Dispense  Refill  . aspirin 81 MG tablet   Oral   Take 81 mg by mouth daily.          Marland Kitchen BIOTIN PO   Oral   Take 1 tablet by mouth daily.         . Brinzolamide-Brimonidine (SIMBRINZA) 1-0.2 % SUSP   Left Eye   Place 1 drop into the left eye daily.         . Calcium Carbonate (CALCIUM 500 PO)   Oral   Take 1 capsule by mouth daily.           . diazepam (VALIUM) 5 MG tablet   Oral   Take 1 tablet (5 mg  total) by mouth every 8 (eight) hours as needed for muscle spasms.   15 tablet   0   . docusate sodium (COLACE) 100 MG capsule   Oral   Take 100 mg by mouth daily.         Marland Kitchen estradiol (ESTRACE) 1 MG tablet   Oral   Take 1 mg by mouth daily.          . ferrous sulfate 325 (65 FE) MG tablet   Oral   Take 325 mg by mouth daily with breakfast.         . fluticasone (FLONASE) 50 MCG/ACT nasal spray   Nasal   Place 1 spray into the nose daily.   16 g   11   . HYDROcodone-acetaminophen (NORCO/VICODIN) 5-325 MG per tablet   Oral   Take 2 tablets by mouth every 4 (four) hours as needed for severe pain.         Marland Kitchen L-Lysine 1000 MG TABS   Oral   Take 1 tablet by mouth daily.         . meloxicam (MOBIC) 15 MG tablet   Oral   Take 15 mg by mouth daily.         . Multiple Vitamins-Minerals (ONE-A-DAY WOMENS 50 PLUS PO)   Oral   Take 1 tablet by mouth daily.           . prednisoLONE acetate (PRED FORTE) 1 % ophthalmic suspension   Left Eye   Place 1 drop into the left eye 3 (three) times daily.          . predniSONE (DELTASONE) 20 MG tablet      3 tabs po daily x 3 days, then  2 tabs x 3 days, then 1.5 tabs x 3 days, then 1 tab x 3 days, then 0.5 tabs x 3 days   27 tablet   0    BP 117/55  Pulse 65  Temp(Src) 98.3 F (36.8 C) (Oral)  Resp 17  SpO2 97% Physical Exam  Nursing note and vitals reviewed. Constitutional: She is oriented to person, place, and time. She appears well-developed and well-nourished. No distress.  HENT:  Head: Normocephalic and atraumatic.  Eyes: Conjunctivae and EOM are normal.  Neck: Normal range of motion.  Cardiovascular: Normal rate and regular rhythm.  Exam reveals no gallop and no friction rub.   No murmur heard. Pulmonary/Chest: Effort normal and breath sounds normal. She has no wheezes. She has no rales. She exhibits no tenderness.  Abdominal: Soft. She exhibits no distension. There is no tenderness. There is no rebound.  Musculoskeletal: Normal range of motion.  Left gluteal tenderness to palpation. Full ROM of left hip. No left hip tenderness or deformity. No midline spine tenderness.   Neurological: She is alert and oriented to person, place, and time. Coordination normal.  Lower extremity strength and sensation equal and intact bilaterally. Speech is goal-oriented. Moves limbs without ataxia.   Skin: Skin is warm and dry.  Psychiatric: She has a normal mood and affect. Her behavior is normal.    ED Course  Procedures (including critical care time) Labs Review Labs Reviewed  CBC WITH DIFFERENTIAL - Abnormal; Notable for the following:    WBC 22.0 (*)    Neutrophils Relative % 88 (*)    Neutro Abs 19.4 (*)    Lymphocytes Relative 6 (*)    Monocytes Absolute 1.4 (*)    All other components within normal limits  BASIC METABOLIC PANEL -  Abnormal; Notable for the following:    Glucose, Bld 115 (*)    BUN 25 (*)    All other components within normal limits  URINALYSIS, ROUTINE W REFLEX MICROSCOPIC - Abnormal; Notable for the following:    APPearance CLOUDY (*)    Leukocytes, UA LARGE (*)    All other components  within normal limits  URINE MICROSCOPIC-ADD ON - Abnormal; Notable for the following:    Squamous Epithelial / LPF FEW (*)    Bacteria, UA FEW (*)    All other components within normal limits  URINE CULTURE   Imaging Review Dg Lumbar Spine Complete  03/25/2013   CLINICAL DATA:  Lifting injury.  Pain.  EXAM: LUMBAR SPINE - COMPLETE 4+ VIEW  FINDINGS: Diffuse degenerative change of the thoracolumbar spine. No acute abnormality identified. Bony mineralization is normal. Pedicles are intact. No evidence of compression fracture  IMPRESSION: Diffuse thoracolumbar degenerative disease.  No acute abnormality.   Electronically Signed   By: Maisie Fus  Register   On: 03/25/2013 13:47   Dg Sacrum/coccyx  03/25/2013   CLINICAL DATA:  Lifting injury.  Pain.  EXAM: SACRUM AND COCCYX - 2+ VIEW  COMPARISON:  None.  FINDINGS: Degenerative change of the lumbar spine, both SI joints, both hips. No acute abnormality identified. Calcification in pelvis consistent phleboliths.  IMPRESSION: Degenerative changes no the lumbar spine, both SI joints, both hips. No acute abnormality identified.   Electronically Signed   By: Maisie Fus  Register   On: 03/25/2013 13:49   Dg Hip Complete Left  03/25/2013   CLINICAL DATA:  Lifting injury.  Pain.  EXAM: LEFT HIP - COMPLETE 2+ VIEW  COMPARISON:  None.  FINDINGS: Degenerative changes lumbar spine, both SI joints, and both hips. No evidence of fracture or dislocation. Calcification in pelvis consistent with phlebolith. Subchondral cysts and cortical thickening noted along the ischial tuberosities bilaterally, this is most likely secondary to repetitive stress.  IMPRESSION: Degenerative change, no acute abnormality.   Electronically Signed   By: Maisie Fus  Register   On: 03/25/2013 13:53    EKG Interpretation   None       MDM   1. Back pain     1:27 PM Patient will have plain films of lumbar spine, sacrum and coccyx. I will also check her electrolytes to rule out causes of spasm.  Patient will have pain medication here.   3:35 PM Patient is feeling better after IV pain medication. Patient has no neurovascular compromise. No bladder/bowel incontinence or saddle paresthesia. Xray unremarkable. Patient was noted to have elevated WBC at 22. Patient is afebrile and has no other signs of infection. I will not treat the urinalysis for infection but I will order a culture. No IV drug use or immunocompromise to consider an abscess. Patient will have close follow up with her PCP. Patient will be discharged with pain medication. Patient discussed with Dr. [name] Alvine who agrees with the plan.   Emilia Beck, PA-C 03/25/13 1538

## 2013-03-25 NOTE — ED Notes (Signed)
Pt was here Monday after injuring her back while helping son move. PT complains of muscle spasms and sciatic pain. PT has not fallen, pain has just not gotten better and pt is unable to change clothes. Pt took hydrocodone at home.  Bp- 130/86 HR- 60

## 2013-03-26 LAB — URINE CULTURE: Special Requests: NORMAL

## 2013-03-28 NOTE — ED Provider Notes (Signed)
Medical screening examination/treatment/procedure(s) were performed by non-physician practitioner and as supervising physician I was immediately available for consultation/collaboration.  EKG Interpretation   None         Cherri Yera T Teylor Wolven, MD 03/28/13 1504 

## 2013-03-29 ENCOUNTER — Encounter: Payer: Self-pay | Admitting: Family Medicine

## 2013-03-29 ENCOUNTER — Ambulatory Visit: Payer: Self-pay | Admitting: Family Medicine

## 2013-03-29 ENCOUNTER — Ambulatory Visit (INDEPENDENT_AMBULATORY_CARE_PROVIDER_SITE_OTHER): Payer: BC Managed Care – PPO | Admitting: Family Medicine

## 2013-03-29 VITALS — BP 126/80 | HR 97 | Temp 98.5°F | Ht 63.75 in | Wt 151.8 lb

## 2013-03-29 DIAGNOSIS — M545 Low back pain, unspecified: Secondary | ICD-10-CM

## 2013-03-29 MED ORDER — OXYCODONE-ACETAMINOPHEN 5-325 MG PO TABS: 1.0000 | ORAL_TABLET | ORAL | Status: DC | PRN

## 2013-03-29 NOTE — Progress Notes (Signed)
Subjective:    Patient ID: Megan Harrison, female    DOB: 1956-04-21, 57 y.o.   MRN: [id number]  HPI Megan Harrison to the ER on Monday and on Thursday  Both times she had to go via EMS due to severe pain and inability to get up  Hurts to sit/ stand and walk - and she can lie on side shortly/ can lie on back a little better   Pain is on the L  Sharp "screaming pain"  Has had sciatica in the past - and PT helped  This feels much different -this is excruciating  Is taking percocet for the pain right now-2 pills left   Did xrays - thurs  Deg change LS and SI joints hips   ua had some wbc in it  cx- showed a little bact growth She has a bit of dysuria - ? From her meds   Wbc was quite high at 22 on Thursday- was already on prednisone  She is still on it now  Lab Results  Component Value Date   WBC 22.0* 03/25/2013   HGB 12.4 03/25/2013   HCT 36.1 03/25/2013   MCV 92.3 03/25/2013   PLT 360 03/25/2013    Has never seen orthopedics Has never had MRI of her back  Pain is shooting down her L leg as well and her foot feels numb/tingly   Patient Active Problem List   Diagnosis Date Noted  . Routine general medical examination at a health care facility 06/09/2011  . UNSPECIFIED ANEMIA 05/24/2010  . ALLERGIC RHINITIS 03/10/2009  . HERPES ZOSTER OPHTHALMICUS 08/28/2006  . MIGRAINES, HX OF 08/28/2006   Past Medical History  Diagnosis Date  . Anemia   . Cutaneous sarcoidosis   . Migraine   . Foot pain   . Zoster     Opthy (regular checks)  . Degenerative disc disease    Past Surgical History  Procedure Laterality Date  . Tonsillectomy    . Cesarean section      two times  . Colonoscopy  11/0/2009    diverticulosis and hemorrhoids  . Upper gastrointestinal endoscopy  11/15/2010    non-erosive gastritis, H. pylori negative  . Abdominal hysterectomy  11/99  . Arm lesion, cutaneous sarcoid  12/2005    with normal CXR and ANA  . 2d echo  1999    Negative   History  Substance  Use Topics  . Smoking status: Never Smoker   . Smokeless tobacco: Not on file  . Alcohol Use: No   Family History  Problem Relation Age of Onset  . Colon cancer Brother   . Heart disease Mother     CAD  . Alcohol abuse Mother   . Lung cancer Father   . Heart disease Brother     CAD  . Heart disease Brother     CAD, pre diabetic, obese  . Cancer Brother     Colon CA, liver mets at 62, DM   Allergies  Allergen Reactions  . Codeine     REACTION: syncope  . Sumatriptan     REACTION: choking sensation   Current Outpatient Prescriptions on File Prior to Visit  Medication Sig Dispense Refill  . aspirin 81 MG tablet Take 81 mg by mouth daily.       Marland Kitchen BIOTIN PO Take 1 tablet by mouth daily.      . Brinzolamide-Brimonidine (SIMBRINZA) 1-0.2 % SUSP Place 1 drop into the left eye daily.      Marland Kitchen  Calcium Carbonate (CALCIUM 500 PO) Take 1 capsule by mouth daily.        . cyclobenzaprine (FLEXERIL) 10 MG tablet Take 1 tablet (10 mg total) by mouth 2 (two) times daily as needed for muscle spasms.  20 tablet  0  . docusate sodium (COLACE) 100 MG capsule Take 100 mg by mouth daily.      Marland Kitchen estradiol (ESTRACE) 1 MG tablet Take 1 mg by mouth daily.       . ferrous sulfate 325 (65 FE) MG tablet Take 325 mg by mouth daily with breakfast.      . fluticasone (FLONASE) 50 MCG/ACT nasal spray Place 1 spray into the nose daily.  16 g  11  . L-Lysine 1000 MG TABS Take 1 tablet by mouth daily.      . meloxicam (MOBIC) 15 MG tablet Take 15 mg by mouth daily.      . Multiple Vitamins-Minerals (ONE-A-DAY WOMENS 50 PLUS PO) Take 1 tablet by mouth daily.        Marland Kitchen oxyCODONE-acetaminophen (PERCOCET/ROXICET) 5-325 MG per tablet Take 2 tablets by mouth every 4 (four) hours as needed for severe pain.  24 tablet  0  . prednisoLONE acetate (PRED FORTE) 1 % ophthalmic suspension Place 1 drop into the left eye 3 (three) times daily.       . predniSONE (DELTASONE) 20 MG tablet 3 tabs po daily x 3 days, then 2 tabs x 3  days, then 1.5 tabs x 3 days, then 1 tab x 3 days, then 0.5 tabs x 3 days  27 tablet  0   No current facility-administered medications on file prior to visit.     Review of Systems Review of Systems  Constitutional: Negative for fever, appetite change, fatigue and unexpected weight change.  Eyes: Negative for pain and visual disturbance.  Respiratory: Negative for cough and shortness of breath.   Cardiovascular: Negative for cp or palpitations    Gastrointestinal: Negative for nausea, diarrhea and constipation.  Genitourinary: Negative for urgency and frequency.  Skin: Negative for pallor or rash   MSK pos for severe mid and L lower back pain rad to leg/ neg for joint swelling  Neurological: Negative for , light-headedness, numbness and headaches. pos for weak feeling in L foot Hematological: Negative for adenopathy. Does not bruise/bleed easily.  Psychiatric/Behavioral: Negative for dysphoric mood. The patient is not nervous/anxious.         Objective:   Physical Exam  Constitutional: She appears well-developed and well-nourished. No distress.  Lying on R side on table due to discomfort  Eyes: Conjunctivae and EOM are normal. Pupils are equal, round, and reactive to light.  Neck: Normal range of motion. Neck supple.  Cardiovascular: Regular rhythm and normal heart sounds.   Pulmonary/Chest: Effort normal and breath sounds normal.  Abdominal: Soft. Bowel sounds are normal.  Musculoskeletal:  Tender over L3-L5 Tender in L paralumbar musculature slt tenderness L greater troch SLR on L results in leg pain  Nl rom hip  Gait favors her R side     Lymphadenopathy:    She has no cervical adenopathy.  Neurological: She is alert. She has normal reflexes. She displays no tremor. No sensory deficit.  L foot - slt dec toe lift strength 4/5 (other side 5/5) No foot drop seen but there is a limp due to pain   Skin: Skin is warm and dry. No erythema. No pallor.  Psychiatric: She has a  normal mood and affect.  Assessment & Plan:

## 2013-03-29 NOTE — Progress Notes (Signed)
Pre-visit discussion using our clinic review tool. No additional management support is needed unless otherwise documented below in the visit note.  

## 2013-03-29 NOTE — Assessment & Plan Note (Signed)
Severe-not well controlled with prednisone and percocet  Pain rad to L with mild foot/ great toe weakness and pos SLR  MRI asap  Plan from there  Adv to update if worse or any loss of bowel/ bladder control ER records rev in detail today  Will plan to re check cbc when pt is not on prednisone (likely cause of leukocytosis) ucx showed insig growth-rev with pt

## 2013-03-29 NOTE — Patient Instructions (Addendum)
Stop up front for referral for MRI  Continue heat Here is a px for percoet - use caution with it  If sudden worsening let me know   Schedule non fasting lab in 1 month to re check cbc (white blood cell count)- I think it was high due to prednisone

## 2013-03-30 ENCOUNTER — Encounter: Payer: Self-pay | Admitting: Family Medicine

## 2013-03-31 ENCOUNTER — Telehealth: Payer: Self-pay

## 2013-03-31 NOTE — Telephone Encounter (Signed)
Pt left v/m; pt request recent MRI results.Please advise.

## 2013-03-31 NOTE — Patient Instructions (Signed)
MRI shows spinal stenosis in lumbar area - (without evidence of herniated disc) I think this is causing pain and want to refer her to orthopedics please- let me know if agreeable

## 2013-03-31 NOTE — Telephone Encounter (Signed)
Flag said schedule f/u with Dr. Milinda Antis, so pt scheduled f/u with Dr. Milinda Antis for 11/28 and will discuss with Dr. Milinda Antis directly about referral to Ortho

## 2013-03-31 NOTE — Telephone Encounter (Signed)
I sent you a flag on this She has spinal stenosis - and no herniation however I want to refer to ortho - ? agreeable

## 2013-04-02 ENCOUNTER — Ambulatory Visit (INDEPENDENT_AMBULATORY_CARE_PROVIDER_SITE_OTHER): Payer: BC Managed Care – PPO | Admitting: Family Medicine

## 2013-04-02 ENCOUNTER — Encounter: Payer: Self-pay | Admitting: Family Medicine

## 2013-04-02 VITALS — BP 110/66 | HR 81 | Temp 98.6°F | Ht 63.75 in | Wt 149.2 lb

## 2013-04-02 DIAGNOSIS — M545 Low back pain: Secondary | ICD-10-CM

## 2013-04-02 DIAGNOSIS — K648 Other hemorrhoids: Secondary | ICD-10-CM | POA: Insufficient documentation

## 2013-04-02 DIAGNOSIS — M48061 Spinal stenosis, lumbar region without neurogenic claudication: Secondary | ICD-10-CM

## 2013-04-02 MED ORDER — HYDROCORTISONE ACETATE 25 MG RE SUPP: 25.0000 mg | Freq: Two times a day (BID) | RECTAL | Status: DC

## 2013-04-02 NOTE — Assessment & Plan Note (Signed)
At L3-4 and L4-5 causing radiculopathy Disc position changes  On percocet and finished prednisone Ref made to orthopedics Disc red flags to seek attn for

## 2013-04-02 NOTE — Patient Instructions (Signed)
We will refer you to orthopedics at check out  If symptoms suddenly worsen or you have loss of bowel or bladder control - go to the ER  Keep [place] updated Try miralax daily as directed over the counter for constipation

## 2013-04-02 NOTE — Progress Notes (Signed)
Subjective:    Patient ID: Megan Harrison, female    DOB: 04-04-1956, 57 y.o.   MRN: [id number]  HPI Here for f/u with spinal stenosis - to review MRI  Given a copy of the report   L3-4 and L4-5 spinal stenosis with inv of ligamentum flavum She is using a little less pain med  Is interested in seeing orthopedics   Symptoms are about the same today She has completed steroids And the percocet helps some Taking stool softerner (also on iron) with dulcolax   Internal hemorroids are worse and bleeding a bit with all the straining   Gait is still affected but no foot drop  L foot is still numbish feeling   No loss of bowel or bladder control   Patient Active Problem List   Diagnosis Date Noted  . Spinal stenosis of lumbar region 04/02/2013  . Low back pain potentially associated with radiculopathy 03/29/2013  . Routine general medical examination at a health care facility 06/09/2011  . UNSPECIFIED ANEMIA 05/24/2010  . ALLERGIC RHINITIS 03/10/2009  . HERPES ZOSTER OPHTHALMICUS 08/28/2006  . MIGRAINES, HX OF 08/28/2006   Past Medical History  Diagnosis Date  . Anemia   . Cutaneous sarcoidosis   . Migraine   . Foot pain   . Zoster     Opthy (regular checks)  . Degenerative disc disease    Past Surgical History  Procedure Laterality Date  . Tonsillectomy    . Cesarean section      two times  . Colonoscopy  11/0/2009    diverticulosis and hemorrhoids  . Upper gastrointestinal endoscopy  11/15/2010    non-erosive gastritis, H. pylori negative  . Abdominal hysterectomy  11/99  . Arm lesion, cutaneous sarcoid  12/2005    with normal CXR and ANA  . 2d echo  1999    Negative   History  Substance Use Topics  . Smoking status: Never Smoker   . Smokeless tobacco: Not on file  . Alcohol Use: No   Family History  Problem Relation Age of Onset  . Colon cancer Brother   . Heart disease Mother     CAD  . Alcohol abuse Mother   . Lung cancer Father   . Heart disease  Brother     CAD  . Heart disease Brother     CAD, pre diabetic, obese  . Cancer Brother     Colon CA, liver mets at 62, DM   Allergies  Allergen Reactions  . Codeine     REACTION: syncope  . Sumatriptan     REACTION: choking sensation   Current Outpatient Prescriptions on File Prior to Visit  Medication Sig Dispense Refill  . aspirin 81 MG tablet Take 81 mg by mouth daily.       Marland Kitchen BIOTIN PO Take 1 tablet by mouth daily.      . Brinzolamide-Brimonidine (SIMBRINZA) 1-0.2 % SUSP Place 1 drop into the left eye daily.      . Calcium Carbonate (CALCIUM 500 PO) Take 1 capsule by mouth daily.        . cyclobenzaprine (FLEXERIL) 10 MG tablet Take 1 tablet (10 mg total) by mouth 2 (two) times daily as needed for muscle spasms.  20 tablet  0  . docusate sodium (COLACE) 100 MG capsule Take 100 mg by mouth daily.      Marland Kitchen estradiol (ESTRACE) 1 MG tablet Take 1 mg by mouth daily.       . ferrous  sulfate 325 (65 FE) MG tablet Take 325 mg by mouth daily with breakfast.      . fluticasone (FLONASE) 50 MCG/ACT nasal spray Place 1 spray into the nose daily.  16 g  11  . L-Lysine 1000 MG TABS Take 1 tablet by mouth daily.      . meloxicam (MOBIC) 15 MG tablet Take 15 mg by mouth daily.      . Multiple Vitamins-Minerals (ONE-A-DAY WOMENS 50 PLUS PO) Take 1 tablet by mouth daily.        Marland Kitchen oxyCODONE-acetaminophen (PERCOCET/ROXICET) 5-325 MG per tablet Take 1-2 tablets by mouth every 4 (four) hours as needed for severe pain.  60 tablet  0  . prednisoLONE acetate (PRED FORTE) 1 % ophthalmic suspension Place 1 drop into the left eye 3 (three) times daily.       . predniSONE (DELTASONE) 20 MG tablet 3 tabs po daily x 3 days, then 2 tabs x 3 days, then 1.5 tabs x 3 days, then 1 tab x 3 days, then 0.5 tabs x 3 days  27 tablet  0   No current facility-administered medications on file prior to visit.      Review of Systems Review of Systems  Constitutional: Negative for fever, appetite change, fatigue and  unexpected weight change.  Eyes: Negative for pain and visual disturbance.  Respiratory: Negative for cough and shortness of breath.   Cardiovascular: Negative for cp or palpitations    Gastrointestinal: Negative for nausea, diarrhea and pos for  constipation.  Genitourinary: Negative for urgency and frequency. neg for incontinence  Skin: Negative for pallor or rash   MSK pos for low back and L leg pai  Neurological: Negative for weakness, light-headedness, numbness and headaches.  Hematological: Negative for adenopathy. Does not bruise/bleed easily.  Psychiatric/Behavioral: Negative for dysphoric mood. The patient is not nervous/anxious.         Objective:   Physical Exam  Constitutional: She appears well-developed and well-nourished. No distress.  HENT:  Head: Normocephalic.  Neck: Normal range of motion. Neck supple.  Musculoskeletal: She exhibits no edema.  Neurological: She is alert.  Gait favors R leg- but no foot drop  Skin: Skin is warm and dry. No rash noted.  Psychiatric: She has a normal mood and affect.          Assessment & Plan:

## 2013-04-02 NOTE — Progress Notes (Signed)
Pre-visit discussion using our clinic review tool. No additional management support is needed unless otherwise documented below in the visit note.  

## 2013-04-02 NOTE — Assessment & Plan Note (Signed)
From recent straining/ constipation assoc with narcotic pain med anusol hc suppositories given for bid use  Disc use of miralax daily to help constipation further

## 2013-04-05 ENCOUNTER — Encounter: Payer: Self-pay | Admitting: Internal Medicine

## 2013-05-12 ENCOUNTER — Encounter: Payer: Self-pay | Admitting: Internal Medicine

## 2013-05-19 ENCOUNTER — Ambulatory Visit (HOSPITAL_COMMUNITY)
Admission: RE | Admit: 2013-05-19 | Discharge: 2013-05-19 | Disposition: A | Discharge status: Home / Self Care | Payer: BC Managed Care – PPO | Source: Ambulatory Visit | Attending: Obstetrics and Gynecology | Admitting: Obstetrics and Gynecology

## 2013-05-19 DIAGNOSIS — Z1231 Encounter for screening mammogram for malignant neoplasm of breast: Secondary | ICD-10-CM | POA: Insufficient documentation

## 2013-06-16 ENCOUNTER — Ambulatory Visit (AMBULATORY_SURGERY_CENTER): Payer: BC Managed Care – PPO

## 2013-06-16 VITALS — Ht 64.0 in | Wt 150.0 lb

## 2013-06-16 DIAGNOSIS — Z800 Family history of malignant neoplasm of digestive organs: Secondary | ICD-10-CM

## 2013-06-16 MED ORDER — SUPREP BOWEL PREP KIT 17.5-3.13-1.6 GM/177ML PO SOLN: 1.0000 | Freq: Once | ORAL | Status: DC

## 2013-06-17 ENCOUNTER — Encounter: Payer: Self-pay | Admitting: Internal Medicine

## 2013-06-29 ENCOUNTER — Encounter: Payer: Self-pay | Admitting: Podiatry

## 2013-06-29 ENCOUNTER — Ambulatory Visit (INDEPENDENT_AMBULATORY_CARE_PROVIDER_SITE_OTHER): Payer: BC Managed Care – PPO | Admitting: Podiatry

## 2013-06-29 VITALS — BP 157/80 | HR 73 | Resp 16 | Ht 63.0 in | Wt 150.0 lb

## 2013-06-29 DIAGNOSIS — L600 Ingrowing nail: Secondary | ICD-10-CM

## 2013-06-29 NOTE — Patient Instructions (Signed)

## 2013-06-29 NOTE — Progress Notes (Signed)
Subjective:     Patient ID: Megan Harrison, female   DOB: Mar 26, 1956, 58 y.o.   MRN: [id number]  HPI patient presents stating I am having a lot of pain with my big toenail on my left foot on the border against my second toe. States it had drainage and has been painful   Review of Systems  All other systems reviewed and are negative.       Objective:   Physical Exam  Nursing note and vitals reviewed. Constitutional: She is oriented to person, place, and time.  Cardiovascular: Intact distal pulses.   Musculoskeletal: Normal range of motion.  Neurological: She is oriented to person, place, and time.  Skin: Skin is warm.   neurovascular status intact with no health history changes noted an incurvated lateral border left hallux that is painful when pressed. Muscle strength adequate with range of motion adequate     Assessment:     Ingrown toenail with incurvated bed left hallux lateral border    Plan:     H&P performed and conditions discussed. I have recommended removal of the corner and explained to the patient the procedure and risk associated with ingrown toenail removal. Patient wants surgery and today I infiltrated 60 mg Xylocaine Marcaine mixture remove the lateral border exposed the matrix and applied phenol 3 applications 30 seconds followed by alcohol lavaged and sterile dressing gave instructions on soaks and reappoint her recheck

## 2013-07-01 ENCOUNTER — Encounter: Payer: BC Managed Care – PPO | Admitting: Internal Medicine

## 2013-07-07 ENCOUNTER — Ambulatory Visit: Payer: Self-pay | Admitting: Podiatry

## 2013-08-18 ENCOUNTER — Encounter: Payer: Self-pay | Admitting: *Deleted

## 2013-08-18 ENCOUNTER — Encounter: Payer: Self-pay | Admitting: Internal Medicine

## 2013-08-18 ENCOUNTER — Ambulatory Visit (AMBULATORY_SURGERY_CENTER): Payer: BC Managed Care – PPO | Admitting: Internal Medicine

## 2013-08-18 VITALS — BP 121/55 | HR 62 | Temp 97.5°F | Resp 18 | Ht 64.0 in | Wt 146.0 lb

## 2013-08-18 DIAGNOSIS — D126 Benign neoplasm of colon, unspecified: Secondary | ICD-10-CM

## 2013-08-18 DIAGNOSIS — Z1211 Encounter for screening for malignant neoplasm of colon: Secondary | ICD-10-CM

## 2013-08-18 DIAGNOSIS — Z800 Family history of malignant neoplasm of digestive organs: Secondary | ICD-10-CM

## 2013-08-18 MED ORDER — SODIUM CHLORIDE 0.9 % IV SOLN
500.0000 mL | INTRAVENOUS | Status: DC
Start: 2013-08-18 — End: 2013-08-18

## 2013-08-18 NOTE — Op Note (Signed)
[place]  [address]. [place], [id number]   COLONOSCOPY PROCEDURE REPORT  PATIENT: Megan Harrison, Megan Harrison  MR#: [id number] BIRTHDATE: 1955-07-22 , 79  yrs. old GENDER: Female ENDOSCOPIST: Gatha Mayer, MD, [facility] PROCEDURE DATE:  08/18/2013 PROCEDURE:   Colonoscopy with snare polypectomy First Screening Colonoscopy - Avg.  risk and is 50 yrs.  old or older - No.  Prior Negative Screening - Now for repeat screening. Above average risk  History of Adenoma - Now for follow-up colonoscopy & has been > or = to 3 yrs.  N/A  Polyps Removed Today? Yes. ASA CLASS:   Class II INDICATIONS:elevated risk screening and Patient's immediate family history of colon cancer. MEDICATIONS: Propofol (Diprivan) 240 mg IV, MAC sedation, administered by CRNA, and These medications were titrated to patient response per physician's verbal order  DESCRIPTION OF PROCEDURE:   After the risks benefits and alternatives of the procedure were thoroughly explained, informed consent was obtained.  A digital rectal exam revealed no abnormalities of the rectum.   The LB [id number]  endoscope was introduced through the anus and advanced to the cecum, which was identified by both the appendix and ileocecal valve. No adverse events experienced.   The quality of the prep was excellent using Suprep  The instrument was then slowly withdrawn as the colon was fully examined.  COLON FINDINGS: A flat polyp measuring 10-12 mm in size was found at the cecum.  A polypectomy was performed using snare cautery.  The resection was complete and the polyp tissue was completely retrieved.   The colon mucosa was otherwise normal.   A right colon retroflexion was performed.  Retroflexed views revealed no abnormalities. The time to cecum=3 minutes 08 seconds.  Withdrawal time=9 minutes 55 seconds.  The scope was withdrawn and the procedure completed. COMPLICATIONS: There were no complications.  ENDOSCOPIC  IMPRESSION: 1.   Flat polyp measuring 10-12 mm in size was found at the cecum; polypectomy was performed using snare cautery 2.   The colon mucosa was otherwise normal - excellent prep in patient with FHx colon cancer, no prior polyps  RECOMMENDATIONS: 1.  Hold aspirin, aspirin products, and anti-inflammatory medication for 2 weeks. 2.  Timing of repeat colonoscopy will be determined by pathology findings.   eSigned:  Gatha Mayer, MD, [facility] 08/18/2013 3:14 PM   cc: The Patient

## 2013-08-18 NOTE — Patient Instructions (Addendum)
I found and removed one polyp today. It looks benign. Everything else was normal.  I will let you know pathology results and when to have another routine colonoscopy by mail.  I appreciate the opportunity to care for you. Gatha Mayer, MD, FACG  YOU HAD AN ENDOSCOPIC PROCEDURE TODAY AT [facility]: Refer to the procedure report that was given to you for any specific questions about what was found during the examination.  If the procedure report does not answer your questions, please call your gastroenterologist to clarify.  If you requested that your care partner not be given the details of your procedure findings, then the procedure report has been included in a sealed envelope for you to review at your convenience later.  YOU SHOULD EXPECT: Some feelings of bloating in the abdomen. Passage of more gas than usual.  Walking can help get rid of the air that was put into your GI tract during the procedure and reduce the bloating. If you had a lower endoscopy (such as a colonoscopy or flexible sigmoidoscopy) you may notice spotting of blood in your stool or on the toilet paper. If you underwent a bowel prep for your procedure, then you may not have a normal bowel movement for a few days.  DIET: Your first meal following the procedure should be a light meal and then it is ok to progress to your normal diet.  A half-sandwich or bowl of soup is an example of a good first meal.  Heavy or fried foods are harder to digest and may make you feel nauseous or bloated.  Likewise meals heavy in dairy and vegetables can cause extra gas to form and this can also increase the bloating.  Drink plenty of fluids but you should avoid alcoholic beverages for 24 hours.  ACTIVITY: Your care partner should take you home directly after the procedure.  You should plan to take it easy, moving slowly for the rest of the day.  You can resume normal activity the day after the procedure however you should NOT DRIVE  or use heavy machinery for 24 hours (because of the sedation medicines used during the test).    SYMPTOMS TO REPORT IMMEDIATELY: A gastroenterologist can be reached at any hour.  During normal business hours, 8:30 AM to 5:00 PM Monday through Friday, call [phone].  After hours and on weekends, please call the GI answering service at [phone] who will take a message and have the physician on call contact you.   Following lower endoscopy (colonoscopy or flexible sigmoidoscopy):  Excessive amounts of blood in the stool  Significant tenderness or worsening of abdominal pains  Swelling of the abdomen that is new, acute  Fever of 100F or higher  FOLLOW UP: If any biopsies were taken you will be contacted by phone or by letter within the next 1-3 weeks.  Call your gastroenterologist if you have not heard about the biopsies in 3 weeks.  Our staff will call the home number listed on your records the next business day following your procedure to check on you and address any questions or concerns that you may have at that time regarding the information given to you following your procedure. This is a courtesy call and so if there is no answer at the home number and we have not heard from you through the emergency physician on call, we will assume that you have returned to your regular daily activities without incident.  SIGNATURES/CONFIDENTIALITY: You and/or  your care partner have signed paperwork which will be entered into your electronic medical record.  These signatures attest to the fact that that the information above on your After Visit Summary has been reviewed and is understood.  Full responsibility of the confidentiality of this discharge information lies with you and/or your care-partner.

## 2013-08-18 NOTE — Progress Notes (Signed)
Called to room to assist during endoscopic procedure.  Patient ID and intended procedure confirmed with present staff. Received instructions for my participation in the procedure from the performing physician.  

## 2013-08-18 NOTE — Progress Notes (Signed)
A/ox3 pleased with MAC, report to Karol RN 

## 2013-08-19 ENCOUNTER — Telehealth: Payer: Self-pay | Admitting: *Deleted

## 2013-08-19 NOTE — Telephone Encounter (Signed)
  Follow up Call-  Call back number 08/18/2013  Post procedure Call Back phone  # [phone]  Permission to leave phone message Yes     Patient questions:  Do you have a fever, pain , or abdominal swelling? no Pain Score  0 *  Have you tolerated food without any problems? yes  Have you been able to return to your normal activities? yes  Do you have any questions about your discharge instructions: Diet   no Medications  no Follow up visit  no  Do you have questions or concerns about your Care? no  Actions: * If pain score is 4 or above: No action needed, pain <4.

## 2013-08-24 ENCOUNTER — Encounter: Payer: Self-pay | Admitting: Internal Medicine

## 2013-08-24 NOTE — Progress Notes (Signed)
Quick Note:  Polypoid mucosa Rep[eat colonoscopy 2020 (FHx) ______

## 2013-10-15 ENCOUNTER — Other Ambulatory Visit: Payer: BC Managed Care – PPO

## 2013-10-20 ENCOUNTER — Encounter: Payer: BC Managed Care – PPO | Admitting: Family Medicine

## 2013-10-23 ENCOUNTER — Telehealth: Payer: Self-pay | Admitting: Family Medicine

## 2013-10-23 DIAGNOSIS — Z0000 Encounter for general adult medical examination without abnormal findings: Secondary | ICD-10-CM

## 2013-10-23 NOTE — Telephone Encounter (Signed)
Message copied by Abner Greenspan on Sat Oct 23, 2013  5:22 PM ------      Message from: Ellamae Sia      Created: Tue Oct 19, 2013 12:58 PM      Regarding: Lab orders for Tuesday, 6.23.15       Patient is scheduled for CPX labs, please order future labs, Thanks , Terri       ------

## 2013-10-26 ENCOUNTER — Other Ambulatory Visit (INDEPENDENT_AMBULATORY_CARE_PROVIDER_SITE_OTHER): Payer: BC Managed Care – PPO

## 2013-10-26 DIAGNOSIS — D649 Anemia, unspecified: Secondary | ICD-10-CM

## 2013-10-26 DIAGNOSIS — Z0000 Encounter for general adult medical examination without abnormal findings: Secondary | ICD-10-CM

## 2013-10-26 LAB — CBC WITH DIFFERENTIAL/PLATELET
BASOS PCT: 0.3 % (ref 0.0–3.0)
Basophils Absolute: 0 10*3/uL (ref 0.0–0.1)
EOS PCT: 1.8 % (ref 0.0–5.0)
Eosinophils Absolute: 0.1 10*3/uL (ref 0.0–0.7)
HEMATOCRIT: 36 % (ref 36.0–46.0)
Hemoglobin: 12.2 g/dL (ref 12.0–15.0)
LYMPHS ABS: 2 10*3/uL (ref 0.7–4.0)
Lymphocytes Relative: 28 % (ref 12.0–46.0)
MCHC: 33.8 g/dL (ref 30.0–36.0)
MCV: 93 fl (ref 78.0–100.0)
Monocytes Absolute: 0.5 10*3/uL (ref 0.1–1.0)
Monocytes Relative: 7.1 % (ref 3.0–12.0)
Neutro Abs: 4.6 10*3/uL (ref 1.4–7.7)
Neutrophils Relative %: 62.8 % (ref 43.0–77.0)
PLATELETS: 334 10*3/uL (ref 150.0–400.0)
RBC: 3.87 Mil/uL (ref 3.87–5.11)
RDW: 13.4 % (ref 11.5–15.5)
WBC: 7.3 10*3/uL (ref 4.0–10.5)

## 2013-10-26 LAB — COMPREHENSIVE METABOLIC PANEL
ALBUMIN: 3.8 g/dL (ref 3.5–5.2)
ALT: 14 U/L (ref 0–35)
AST: 20 U/L (ref 0–37)
Alkaline Phosphatase: 52 U/L (ref 39–117)
BILIRUBIN TOTAL: 0.4 mg/dL (ref 0.2–1.2)
BUN: 17 mg/dL (ref 6–23)
CO2: 26 meq/L (ref 19–32)
Calcium: 9.2 mg/dL (ref 8.4–10.5)
Chloride: 107 mEq/L (ref 96–112)
Creatinine, Ser: 0.7 mg/dL (ref 0.4–1.2)
GFR: 89.24 mL/min (ref >60.00)
GLUCOSE: 100 mg/dL — AB (ref 70–99)
Potassium: 4 mEq/L (ref 3.5–5.1)
Sodium: 140 mEq/L (ref 135–145)
Total Protein: 6.9 g/dL (ref 6.0–8.3)

## 2013-10-26 LAB — LIPID PANEL
CHOLESTEROL: 196 mg/dL (ref 0–200)
HDL: 74.5 mg/dL (ref >39.00)
LDL Cholesterol: 105 mg/dL — ABNORMAL HIGH (ref 0–99)
NonHDL: 121.5
Total CHOL/HDL Ratio: 3
Triglycerides: 84 mg/dL (ref 0.0–149.0)
VLDL: 16.8 mg/dL (ref 0.0–40.0)

## 2013-10-26 LAB — TSH: TSH: 1.81 u[IU]/mL (ref 0.35–4.50)

## 2013-10-27 ENCOUNTER — Other Ambulatory Visit: Payer: Self-pay | Admitting: Family Medicine

## 2013-11-02 ENCOUNTER — Encounter: Payer: Self-pay | Admitting: Family Medicine

## 2013-11-02 ENCOUNTER — Ambulatory Visit (INDEPENDENT_AMBULATORY_CARE_PROVIDER_SITE_OTHER): Payer: BC Managed Care – PPO | Admitting: Family Medicine

## 2013-11-02 VITALS — BP 128/74 | HR 71 | Temp 98.5°F | Ht 63.25 in | Wt 153.2 lb

## 2013-11-02 DIAGNOSIS — Z0000 Encounter for general adult medical examination without abnormal findings: Secondary | ICD-10-CM

## 2013-11-02 DIAGNOSIS — D649 Anemia, unspecified: Secondary | ICD-10-CM

## 2013-11-02 MED ORDER — FLUTICASONE PROPIONATE 50 MCG/ACT NA SUSP: NASAL | Status: DC

## 2013-11-02 NOTE — Assessment & Plan Note (Signed)
This is improved with iron  Rev labs  Neg GI w/u Will continue to follow

## 2013-11-02 NOTE — Progress Notes (Signed)
Subjective:    Patient ID: Megan Harrison, female    DOB: [birth year], 58 y.o.   MRN: [id number]  HPI Here for health maintenance exam and to review chronic medical problems   Feeling well in general   Had a bad issue with her back in November-pain is improved (sacroiliac) on the L and some foot numbness Went to PT and now doing the exercises at home - and careful about lifting  She is getting by  Has bulging discs  Tries to stay active   Working on balance as well  No falls   Mood has been good - stays very motivated   Wt is up 7 lb with bmi of 26  Zoster status- has had it - and it affected her eye -would like to get the vaccine if affordable   Pap 7/14 with Dr Harrington Challenger -will see him in July  Mammogram nl 1/15-normal  Self exam - no lumps   Flu vaccine 9/14  Td 4/06   colonosc 4/15 -polyp with 5 y f/u   Hx of anemia on iron s/p GI w/u Lab Results  Component Value Date   WBC 7.3 10/26/2013   HGB 12.2 10/26/2013   HCT 36.0 10/26/2013   MCV 93.0 10/26/2013   PLT 334.0 10/26/2013    In good control with iron replacement   Results for orders placed in visit on 10/26/13  CBC WITH DIFFERENTIAL      Result Value Ref Range   WBC 7.3  4.0 - 10.5 K/uL   RBC 3.87  3.87 - 5.11 Mil/uL   Hemoglobin 12.2  12.0 - 15.0 g/dL   HCT 36.0  36.0 - 46.0 %   MCV 93.0  78.0 - 100.0 fl   MCHC 33.8  30.0 - 36.0 g/dL   RDW 13.4  11.5 - 15.5 %   Platelets 334.0  150.0 - 400.0 K/uL   Neutrophils Relative % 62.8  43.0 - 77.0 %   Lymphocytes Relative 28.0  12.0 - 46.0 %   Monocytes Relative 7.1  3.0 - 12.0 %   Eosinophils Relative 1.8  0.0 - 5.0 %   Basophils Relative 0.3  0.0 - 3.0 %   Neutro Abs 4.6  1.4 - 7.7 K/uL   Lymphs Abs 2.0  0.7 - 4.0 K/uL   Monocytes Absolute 0.5  0.1 - 1.0 K/uL   Eosinophils Absolute 0.1  0.0 - 0.7 K/uL   Basophils Absolute 0.0  0.0 - 0.1 K/uL  COMPREHENSIVE METABOLIC PANEL      Result Value Ref Range   Sodium 140  135 - 145 mEq/L   Potassium 4.0  3.5 - 5.1  mEq/L   Chloride 107  96 - 112 mEq/L   CO2 26  19 - 32 mEq/L   Glucose, Bld 100 (*) 70 - 99 mg/dL   BUN 17  6 - 23 mg/dL   Creatinine, Ser 0.7  0.4 - 1.2 mg/dL   Total Bilirubin 0.4  0.2 - 1.2 mg/dL   Alkaline Phosphatase 52  39 - 117 U/L   AST 20  0 - 37 U/L   ALT 14  0 - 35 U/L   Total Protein 6.9  6.0 - 8.3 g/dL   Albumin 3.8  3.5 - 5.2 g/dL   Calcium 9.2  8.4 - 10.5 mg/dL   GFR 89.24  >60.00 mL/min  LIPID PANEL      Result Value Ref Range   Cholesterol 196  0 - 200 mg/dL  Triglycerides 84.0  0.0 - 149.0 mg/dL   HDL 74.50  >39.00 mg/dL   VLDL 16.8  0.0 - 40.0 mg/dL   LDL Cholesterol 105 (*) 0 - 99 mg/dL   Total CHOL/HDL Ratio 3     NonHDL 121.50    TSH      Result Value Ref Range   TSH 1.81  0.35 - 4.50 uIU/mL    She eats a healthy diet for the most part  Doing some exercise - had to give up zumba due to back problems / still goes to the gym    Patient Active Problem List   Diagnosis Date Noted  . Spinal stenosis of lumbar region 04/02/2013  . Hemorrhoids, internal 04/02/2013  . Low back pain potentially associated with radiculopathy 03/29/2013  . Routine general medical examination at a health care facility 06/09/2011  . UNSPECIFIED ANEMIA 05/24/2010  . ALLERGIC RHINITIS 03/10/2009  . HERPES ZOSTER OPHTHALMICUS 08/28/2006  . MIGRAINES, HX OF 08/28/2006   Past Medical History  Diagnosis Date  . Anemia   . Cutaneous sarcoidosis   . Migraine   . Foot pain   . Zoster     Opthy (regular checks)  . Degenerative disc disease    Past Surgical History  Procedure Laterality Date  . Tonsillectomy    . Cesarean section      two times  . Colonoscopy  11/0/2009    diverticulosis and hemorrhoids  . Upper gastrointestinal endoscopy  11/15/2010    non-erosive gastritis, H. pylori negative  . Abdominal hysterectomy  11/99  . Arm lesion, cutaneous sarcoid  12/2005    with normal CXR and ANA  . 2d echo  1999    Negative   History  Substance Use Topics  . Smoking  status: Never Smoker   . Smokeless tobacco: Never Used  . Alcohol Use: No   Family History  Problem Relation Age of Onset  . Colon cancer Brother   . Heart disease Mother     CAD  . Alcohol abuse Mother   . Lung cancer Father   . Heart disease Brother     CAD  . Heart disease Brother     CAD, pre diabetic, obese  . Cancer Brother     Colon CA, liver mets at 62, DM   Allergies  Allergen Reactions  . Codeine     REACTION: syncope  . Sumatriptan     REACTION: choking sensation   Current Outpatient Prescriptions on File Prior to Visit  Medication Sig Dispense Refill  . aspirin 81 MG tablet Take 81 mg by mouth daily.       . Brinzolamide-Brimonidine (SIMBRINZA) 1-0.2 % SUSP Place 1 drop into the left eye daily.      . Calcium Carbonate (CALCIUM 500 PO) Take 1 capsule by mouth daily.        Marland Kitchen docusate sodium (COLACE) 100 MG capsule Take 100 mg by mouth daily.      Marland Kitchen estradiol (ESTRACE) 1 MG tablet Take 1 mg by mouth daily.       . fluticasone (FLONASE) 50 MCG/ACT nasal spray USE ONE SPRAY IN THE NOSE ONCE DAILY AS DIRECTED  16 g  0  . meloxicam (MOBIC) 15 MG tablet Take 15 mg by mouth daily.      . Multiple Vitamins-Minerals (ONE-A-DAY WOMENS 50 PLUS PO) Take 1 tablet by mouth daily.        . prednisoLONE acetate (PRED FORTE) 1 % ophthalmic suspension Place 1 drop  into the left eye 3 (three) times daily.        No current facility-administered medications on file prior to visit.     Review of Systems Review of Systems  Constitutional: Negative for fever, appetite change, fatigue and unexpected weight change.  Eyes: Negative for pain and visual disturbance.  Respiratory: Negative for cough and shortness of breath.   Cardiovascular: Negative for cp or palpitations    Gastrointestinal: Negative for nausea, diarrhea and constipation.  Genitourinary: Negative for urgency and frequency.  Skin: Negative for pallor or rash   MSK pos for back pain  Neurological: Negative for  weakness, light-headedness, numbness and headaches.  Hematological: Negative for adenopathy. Does not bruise/bleed easily.  Psychiatric/Behavioral: Negative for dysphoric mood. The patient is not nervous/anxious.         Objective:   Physical Exam  Constitutional: She appears well-developed and well-nourished. No distress.  overwt and well app  HENT:  Head: Normocephalic and atraumatic.  Right Ear: External ear normal.  Left Ear: External ear normal.  Nose: Nose normal.  Mouth/Throat: Oropharynx is clear and moist.  Eyes: Conjunctivae and EOM are normal. Pupils are equal, round, and reactive to light. Right eye exhibits no discharge. Left eye exhibits no discharge. No scleral icterus.  Neck: Normal range of motion. Neck supple. No JVD present. No thyromegaly present.  Cardiovascular: Normal rate, regular rhythm, normal heart sounds and intact distal pulses.  Exam reveals no gallop.   Pulmonary/Chest: Effort normal and breath sounds normal. No respiratory distress. She has no wheezes. She has no rales.  Abdominal: Soft. Bowel sounds are normal. She exhibits no distension and no mass. There is no tenderness.  Musculoskeletal: She exhibits no edema and no tenderness.  Lymphadenopathy:    She has no cervical adenopathy.  Neurological: She is alert. She has normal reflexes. No cranial nerve deficit. She exhibits normal muscle tone. Coordination normal.  Skin: Skin is warm and dry. No rash noted. No erythema. No pallor.  Psychiatric: She has a normal mood and affect.          Assessment & Plan:   Problem List Items Addressed This Visit     Other   UNSPECIFIED ANEMIA     This is improved with iron  Rev labs  Neg GI w/u Will continue to follow      Relevant Medications      ferrous sulfate 325 (65 FE) MG tablet   Routine general medical examination at a health care facility - Primary     Reviewed health habits including diet and exercise and skin cancer prevention Reviewed  appropriate screening tests for age  Also reviewed health mt list, fam hx and immunization status , as well as social and family history    Labs rev See HPI Enc to continue low impact exercise

## 2013-11-02 NOTE — Patient Instructions (Signed)
If you are interested in a shingles/zoster vaccine - call your insurance to check on coverage,( you should not get it within 1 month of other vaccines) , then call us for a prescription  for it to take to a pharmacy that gives the shot , or make a nurse visit to get it here depending on your coverage   Keep working on healthy diet and exercise

## 2013-11-02 NOTE — Assessment & Plan Note (Signed)
Reviewed health habits including diet and exercise and skin cancer prevention Reviewed appropriate screening tests for age  Also reviewed health mt list, fam hx and immunization status , as well as social and family history    Labs rev See HPI Enc to continue low impact exercise

## 2013-11-02 NOTE — Progress Notes (Signed)
Pre visit review using our clinic review tool, if applicable. No additional management support is needed unless otherwise documented below in the visit note. 

## 2013-11-03 ENCOUNTER — Telehealth: Payer: Self-pay

## 2013-11-03 MED ORDER — ZOSTER VACCINE LIVE 19400 UNT/0.65ML ~~LOC~~ SOLR: 0.6500 mL | Freq: Once | SUBCUTANEOUS | Status: DC

## 2013-11-03 NOTE — Telephone Encounter (Signed)
Pt called to get prescription to have shingles vaccine at our office; advised pt she does not need rx if receives shingles vaccine at our office but does need rx sent to pharmacy of pts choice that gives vaccine. Pt will ck with ins co. To see where she should get vaccine and pt will cb to schedule nurse visit or request rx sent to pharmacy.

## 2013-11-03 NOTE — Addendum Note (Signed)
Addended by: Loura Pardon A on: 11/03/2013 06:51 PM   Modules accepted: Orders

## 2013-11-03 NOTE — Telephone Encounter (Signed)
Pt request rx for shingles vaccine sent to [facility]. Spoke with Melissa at [facility] and pharmacy does have shingles vaccine available and pt needs to call before coming for injection; 2 pharmacist have to be on duty for pt to get immunization. Pt voice understanding and is OK with that.

## 2013-11-03 NOTE — Telephone Encounter (Signed)
Set to pharmacy

## 2013-11-04 NOTE — Telephone Encounter (Signed)
Pt notified Rx for vaccine sent to pharmacy

## 2013-12-25 ENCOUNTER — Other Ambulatory Visit: Payer: Self-pay | Admitting: Family Medicine

## 2013-12-27 NOTE — Telephone Encounter (Signed)
done

## 2013-12-27 NOTE — Telephone Encounter (Signed)
Electronic refill request, please advise  

## 2013-12-27 NOTE — Telephone Encounter (Signed)
Please refill for a year  

## 2014-05-11 ENCOUNTER — Ambulatory Visit: Payer: BC Managed Care – PPO | Admitting: Podiatry

## 2014-05-23 ENCOUNTER — Other Ambulatory Visit (HOSPITAL_COMMUNITY): Payer: Self-pay | Admitting: Obstetrics and Gynecology

## 2014-05-23 DIAGNOSIS — Z1231 Encounter for screening mammogram for malignant neoplasm of breast: Secondary | ICD-10-CM

## 2014-06-01 ENCOUNTER — Ambulatory Visit (HOSPITAL_COMMUNITY)
Admission: RE | Admit: 2014-06-01 | Discharge: 2014-06-01 | Disposition: A | Discharge status: Home / Self Care | Payer: BC Managed Care – PPO | Source: Ambulatory Visit | Attending: Obstetrics and Gynecology | Admitting: Obstetrics and Gynecology

## 2014-06-01 DIAGNOSIS — Z1231 Encounter for screening mammogram for malignant neoplasm of breast: Secondary | ICD-10-CM | POA: Diagnosis present

## 2014-07-15 ENCOUNTER — Encounter: Payer: Self-pay | Admitting: Primary Care

## 2014-07-15 ENCOUNTER — Other Ambulatory Visit: Payer: Self-pay | Admitting: Primary Care

## 2014-07-15 ENCOUNTER — Ambulatory Visit (INDEPENDENT_AMBULATORY_CARE_PROVIDER_SITE_OTHER): Payer: BC Managed Care – PPO | Admitting: Primary Care

## 2014-07-15 VITALS — BP 120/68 | HR 64 | Temp 97.3°F | Ht 63.25 in | Wt 152.8 lb

## 2014-07-15 DIAGNOSIS — A499 Bacterial infection, unspecified: Secondary | ICD-10-CM | POA: Diagnosis not present

## 2014-07-15 DIAGNOSIS — N760 Acute vaginitis: Secondary | ICD-10-CM | POA: Diagnosis not present

## 2014-07-15 DIAGNOSIS — R300 Dysuria: Secondary | ICD-10-CM

## 2014-07-15 DIAGNOSIS — B9689 Other specified bacterial agents as the cause of diseases classified elsewhere: Secondary | ICD-10-CM

## 2014-07-15 DIAGNOSIS — N898 Other specified noninflammatory disorders of vagina: Secondary | ICD-10-CM | POA: Diagnosis not present

## 2014-07-15 LAB — POCT URINALYSIS DIPSTICK
Bilirubin, UA: NEGATIVE
Blood, UA: NEGATIVE
Glucose, UA: NEGATIVE
KETONES UA: NEGATIVE
Nitrite, UA: NEGATIVE
PH UA: 8
PROTEIN UA: NEGATIVE
Spec Grav, UA: 1.01
Urobilinogen, UA: 4

## 2014-07-15 MED ORDER — METRONIDAZOLE 500 MG PO TABS: 500.0000 mg | ORAL_TABLET | Freq: Two times a day (BID) | ORAL | Status: DC

## 2014-07-15 NOTE — Progress Notes (Signed)
Pre visit review using our clinic review tool, if applicable. No additional management support is needed unless otherwise documented below in the visit note. 

## 2014-07-15 NOTE — Assessment & Plan Note (Signed)
Clue cells seen on wet prep. No yeast or trichomonas. No cervix or ovaries- hysterectomy Metronidazole 500mg  BID x 7 days. Follow up as needed.

## 2014-07-15 NOTE — Progress Notes (Signed)
Subjective:    Patient ID: Megan Harrison, female    DOB: [birth year], 59 y.o.   MRN: [id number]  HPI  Megan Harrison is a 59 year old female who presents today with a chief complaint of dysuria, vaginal itching and discharge that have been present for 4-5 weeks. She also reports urinary frequency and bilateral lower groin cramping. Vaginal discharge is yellowish and has begun to decrease but is still present. She's used monistat for 3 days, two weeks ago, without relief from vaginal discharge or itching.  Nothing has helped to alleviate symptoms and nothing seems to aggravate symptoms. Denies hematuria and urgency.  Review of Systems  Constitutional: Negative for fever.  HENT: Negative for sore throat.   Respiratory: Negative for shortness of breath.   Cardiovascular: Negative for chest pain.  Gastrointestinal: Positive for abdominal pain. Negative for nausea and vomiting.       Lower groin cramping. Intermittent.  Genitourinary: Positive for dysuria and vaginal discharge. Negative for urgency, hematuria, flank pain, vaginal bleeding, difficulty urinating, vaginal pain and pelvic pain.       Vaginal itching.       Past Medical History  Diagnosis Date  . Anemia   . Cutaneous sarcoidosis   . Migraine   . Foot pain   . Zoster     Opthy (regular checks)  . Degenerative disc disease     History   Social History  . Marital Status: Married    Spouse Name: N/A  . Number of Children: N/A  . Years of Education: N/A   Occupational History  . Works for [employer]    Social History Main Topics  . Smoking status: Never Smoker   . Smokeless tobacco: Never Used  . Alcohol Use: No  . Drug Use: No  . Sexual Activity: Not on file   Other Topics Concern  . Not on file   Social History Narrative   Regular exercise:  Curves, elliptical, walking.    Past Surgical History  Procedure Laterality Date  . Tonsillectomy    . Cesarean section      two times  . Colonoscopy   11/0/2009    diverticulosis and hemorrhoids  . Upper gastrointestinal endoscopy  11/15/2010    non-erosive gastritis, H. pylori negative  . Abdominal hysterectomy  11/99  . Arm lesion, cutaneous sarcoid  12/2005    with normal CXR and ANA  . 2d echo  1999    Negative    Family History  Problem Relation Age of Onset  . Colon cancer Brother   . Heart disease Mother     CAD  . Alcohol abuse Mother   . Lung cancer Father   . Heart disease Brother     CAD  . Heart disease Brother     CAD, pre diabetic, obese  . Cancer Brother     Colon CA, liver mets at 108, DM    Allergies  Allergen Reactions  . Codeine     REACTION: syncope  . Sumatriptan     REACTION: choking sensation    Current Outpatient Prescriptions on File Prior to Visit  Medication Sig Dispense Refill  . aspirin 81 MG tablet Take 81 mg by mouth daily.     . Brinzolamide-Brimonidine (SIMBRINZA) 1-0.2 % SUSP Place 1 drop into the left eye daily.    . Calcium Carbonate (CALCIUM 500 PO) Take 1 capsule by mouth daily.      Marland Kitchen docusate sodium (COLACE) 100 MG capsule Take  100 mg by mouth daily.    Marland Kitchen estradiol (ESTRACE) 1 MG tablet Take 1 mg by mouth daily.     . ferrous sulfate 325 (65 FE) MG tablet Take 325 mg by mouth daily.    . fluticasone (FLONASE) 50 MCG/ACT nasal spray USE ONE SPRAY IN THE NOSE ONCE DAILY AS DIRECTED 16 g 11  . meloxicam (MOBIC) 15 MG tablet TAKE ONE TABLET BY MOUTH ONCE DAILY 90 tablet 3  . Multiple Vitamins-Minerals (ONE-A-DAY WOMENS 50 PLUS PO) Take 1 tablet by mouth daily.      . prednisoLONE acetate (PRED FORTE) 1 % ophthalmic suspension Place 1 drop into the left eye 3 (three) times daily.     Marland Kitchen zoster vaccine live, PF, (ZOSTAVAX) 41740 UNT/0.65ML injection Inject 19,400 Units into the skin once. 1 vial 0   No current facility-administered medications on file prior to visit.    BP 120/68 mmHg  Pulse 64  Temp(Src) 97.3 F (36.3 C) (Oral)  Ht 5' 3.25" (1.607 m)  Wt 152 lb 12.8 oz (69.31  kg)  BMI 26.84 kg/m2  SpO2 98%    Objective:   Physical Exam  Constitutional: She is oriented to person, place, and time. She appears well-developed and well-nourished.  HENT:  Head: Normocephalic.  Neck: Neck supple.  Cardiovascular: Normal rate and regular rhythm.   Pulmonary/Chest: Effort normal and breath sounds normal.  Abdominal: Soft. Bowel sounds are normal. There is no tenderness.  Genitourinary: There is no rash or tenderness on the right labia. There is no rash or tenderness on the left labia. Vaginal discharge found.  Light greenish/yellow discharge noted. No cervix or ovaries, complete hysterectomy.   Neurological: She is alert and oriented to person, place, and time.  Skin: Skin is warm and dry.  Psychiatric: She has a normal mood and affect.          Assessment & Plan:  Urinalysis + leukocytes without nitrites. Culture sent. I believe her symptoms are related to BV. Will await for culture results.

## 2014-07-15 NOTE — Patient Instructions (Signed)
Start Metronidazole tablets. One tablet by mouth twice daily for 7 days. I hope you feel better soon!  Bacterial Vaginosis Bacterial vaginosis is a vaginal infection that occurs when the normal balance of bacteria in the vagina is disrupted. It results from an overgrowth of certain bacteria. This is the most common vaginal infection in women of childbearing age. Treatment is important to prevent complications, especially in pregnant women, as it can cause a premature delivery. CAUSES  Bacterial vaginosis is caused by an increase in harmful bacteria that are normally present in smaller amounts in the vagina. Several different kinds of bacteria can cause bacterial vaginosis. However, the reason that the condition develops is not fully understood. RISK FACTORS Certain activities or behaviors can put you at an increased risk of developing bacterial vaginosis, including:  Having a new sex partner or multiple sex partners.  Douching.  Using an intrauterine device (IUD) for contraception. Women do not get bacterial vaginosis from toilet seats, bedding, swimming pools, or contact with objects around them. SIGNS AND SYMPTOMS  Some women with bacterial vaginosis have no signs or symptoms. Common symptoms include:  Grey vaginal discharge.  A fishlike odor with discharge, especially after sexual intercourse.  Itching or burning of the vagina and vulva.  Burning or pain with urination. DIAGNOSIS  Your health care provider will take a medical history and examine the vagina for signs of bacterial vaginosis. A sample of vaginal fluid may be taken. Your health care provider will look at this sample under a microscope to check for bacteria and abnormal cells. A vaginal pH test may also be done.  TREATMENT  Bacterial vaginosis may be treated with antibiotic medicines. These may be given in the form of a pill or a vaginal cream. A second round of antibiotics may be prescribed if the condition comes back  after treatment.  HOME CARE INSTRUCTIONS   Only take over-the-counter or prescription medicines as directed by your health care provider.  If antibiotic medicine was prescribed, take it as directed. Make sure you finish it even if you start to feel better.  Do not have sex until treatment is completed.  Tell all sexual partners that you have a vaginal infection. They should see their health care provider and be treated if they have problems, such as a mild rash or itching.  Practice safe sex by using condoms and only having one sex partner. SEEK MEDICAL CARE IF:   Your symptoms are not improving after 3 days of treatment.  You have increased discharge or pain.  You have a fever. MAKE SURE YOU:   Understand these instructions.  Will watch your condition.  Will get help right away if you are not doing well or get worse. FOR MORE INFORMATION  Centers for Disease Control and Prevention, Division of STD Prevention: Appraiser[employer]raud[employer] American Sexual Health Association (ASHA): www.ashastd.org  Document Released: 04/22/2005 Document Revised: 02/10/2013 Document Reviewed: 12/02/2012 [facility] Patient Information 2015 [place], [place]. This information is not intended to replace advice given to you by your health care provider. Make sure you discuss any questions you have with your health care provider.

## 2014-07-16 LAB — GC/CHLAMYDIA PROBE AMP
CT PROBE, AMP APTIMA: NEGATIVE
GC Probe RNA: NEGATIVE

## 2014-07-17 LAB — URINE CULTURE: Colony Count: 40000

## 2014-07-19 LAB — POCT WET PREP WITH KOH
Bacteria Wet Prep HPF POC: NEGATIVE
CLUE CELLS WET PREP PER HPF POC: POSITIVE
Epithelial Wet Prep HPF POC: NEGATIVE
KOH Prep POC: NEGATIVE
RBC Wet Prep HPF POC: NEGATIVE
TRICHOMONAS UA: NEGATIVE
WBC Wet Prep HPF POC: NEGATIVE
Yeast Wet Prep HPF POC: NEGATIVE

## 2014-08-08 ENCOUNTER — Encounter: Payer: Self-pay | Admitting: Podiatry

## 2014-08-08 ENCOUNTER — Ambulatory Visit (INDEPENDENT_AMBULATORY_CARE_PROVIDER_SITE_OTHER): Payer: BC Managed Care – PPO | Admitting: Podiatry

## 2014-08-08 ENCOUNTER — Ambulatory Visit (INDEPENDENT_AMBULATORY_CARE_PROVIDER_SITE_OTHER): Payer: BC Managed Care – PPO

## 2014-08-08 VITALS — BP 145/85 | HR 74 | Resp 16

## 2014-08-08 DIAGNOSIS — M722 Plantar fascial fibromatosis: Secondary | ICD-10-CM

## 2014-08-08 NOTE — Progress Notes (Signed)
She presents today with a chief complaint of a painful knot to the plantar aspect of her left foot. She states that it has been there for a while but has not been painful she states that it has become more painful over the last few weeks.  Objective: Vital signs are stable mild hypertension today. Ulcers are palpable left. A nonfluctuant and nonpulsatile mass plantar medial aspect of the plantar fascia left foot. Measures greater than 1 cm in diameter and is tender on palpation. Radiographs do not demonstrate any type of calcinosis in this area.  Assessment: Plantar fibromatosis left foot. Very small plantar fibroma right foot.  Plan: Injected the left foot today with Kenalog and local anesthetic will follow up with her in 6 weeks.

## 2014-09-21 ENCOUNTER — Ambulatory Visit (INDEPENDENT_AMBULATORY_CARE_PROVIDER_SITE_OTHER): Payer: BC Managed Care – PPO | Admitting: Podiatry

## 2014-09-21 VITALS — BP 125/83 | HR 67 | Resp 16

## 2014-09-21 DIAGNOSIS — M722 Plantar fascial fibromatosis: Secondary | ICD-10-CM | POA: Diagnosis not present

## 2014-09-21 NOTE — Progress Notes (Signed)
She presents today in follow-up for her plantar fibromatosis of her left foot. We injected at the last time she was in. She states that she is nearly 80-90% improved. She still has numbness to the plantar aspect of her left foot but she thinks that may be associated with her disc disease of her back. She would like to have new orthotics.  Objective: Vital signs are stable she is alert and oriented 3. Pulses are strongly palpable left. Dramatic reduction in the size of the plantar fibroma of the left foot along the medial band of the plantar fascia. Medial longitudinal arch is intact without paresthesias upon palpation.  Assessment: History of plantar fasciitis and fibromatosis left foot.  Plan: We will scan her today for a new set of orthotics. She will carefully watch for signs of increasing pain and size of the plantar fibroma. She will notify us with recurrence.

## 2014-10-12 ENCOUNTER — Ambulatory Visit (INDEPENDENT_AMBULATORY_CARE_PROVIDER_SITE_OTHER): Payer: BC Managed Care – PPO | Admitting: Podiatry

## 2014-10-12 ENCOUNTER — Encounter: Payer: Self-pay | Admitting: Podiatry

## 2014-10-12 DIAGNOSIS — M722 Plantar fascial fibromatosis: Secondary | ICD-10-CM

## 2014-10-12 NOTE — Progress Notes (Signed)
Dispensed patient's orthotics with oral and written instructions for wearing. Patient will follow up with Dr. Hyatt in 1 month for an orthotic check. 

## 2014-10-12 NOTE — Patient Instructions (Signed)

## 2014-10-26 ENCOUNTER — Encounter: Payer: Self-pay | Admitting: Primary Care

## 2014-10-26 ENCOUNTER — Ambulatory Visit (INDEPENDENT_AMBULATORY_CARE_PROVIDER_SITE_OTHER): Payer: BC Managed Care – PPO | Admitting: Primary Care

## 2014-10-26 VITALS — BP 120/62 | HR 67 | Temp 97.4°F | Ht 63.25 in | Wt 148.8 lb

## 2014-10-26 DIAGNOSIS — R059 Cough, unspecified: Secondary | ICD-10-CM

## 2014-10-26 DIAGNOSIS — R05 Cough: Secondary | ICD-10-CM | POA: Diagnosis not present

## 2014-10-26 MED ORDER — BENZONATATE 200 MG PO CAPS: 200.0000 mg | ORAL_CAPSULE | Freq: Three times a day (TID) | ORAL | Status: DC | PRN

## 2014-10-26 MED ORDER — AZITHROMYCIN 250 MG PO TABS: ORAL_TABLET | ORAL | Status: DC

## 2014-10-26 NOTE — Progress Notes (Signed)
Pre visit review using our clinic review tool, if applicable. No additional management support is needed unless otherwise documented below in the visit note. 

## 2014-10-26 NOTE — Patient Instructions (Signed)
Start Azithromycin antibiotics. Take 2 tablets by mouth today, then 1 tablet by mouth daily for 4 days.  You may take the Benzonatate capsules three times daily as needed for cough. Use Nyquil at bedtime  It was nice meeting you!

## 2014-10-26 NOTE — Progress Notes (Signed)
Subjective:    Patient ID: Megan Harrison, female    DOB: [birth year], 59 y.o.   MRN: [id number]  HPI  Megan Harrison is a 59 year old female who presents today with a chief complaint of cough. Her cough has been present for 8 days and is mostly non-productive with some chest congestion with yellowish sputum. She was running a fever on Sunday and Monday and experienced worsening cough yesterday. She has been unable to sleep at night recently. She's been taking cough drops, mucinex without relief and nyquil at bedtime with some relief. Overall she's feeling worse.  Review of Systems  Constitutional: Positive for fever and fatigue. Negative for chills.  HENT: Positive for congestion, ear pain, rhinorrhea and sinus pressure. Negative for sore throat.   Respiratory: Positive for cough and chest tightness. Negative for shortness of breath.   Cardiovascular: Negative for chest pain.  Musculoskeletal: Positive for myalgias.       Past Medical History  Diagnosis Date  . Anemia   . Cutaneous sarcoidosis   . Migraine   . Foot pain   . Zoster     Opthy (regular checks)  . Degenerative disc disease     History   Social History  . Marital Status: Married    Spouse Name: N/A  . Number of Children: N/A  . Years of Education: N/A   Occupational History  . Works for [employer]    Social History Main Topics  . Smoking status: Never Smoker   . Smokeless tobacco: Never Used  . Alcohol Use: No  . Drug Use: No  . Sexual Activity: Not on file   Other Topics Concern  . Not on file   Social History Narrative   Regular exercise:  Curves, elliptical, walking.    Past Surgical History  Procedure Laterality Date  . Tonsillectomy    . Cesarean section      two times  . Colonoscopy  11/0/2009    diverticulosis and hemorrhoids  . Upper gastrointestinal endoscopy  11/15/2010    non-erosive gastritis, H. pylori negative  . Abdominal hysterectomy  11/99  . Arm lesion, cutaneous sarcoid   12/2005    with normal CXR and ANA  . 2d echo  1999    Negative    Family History  Problem Relation Age of Onset  . Colon cancer Brother   . Heart disease Mother     CAD  . Alcohol abuse Mother   . Lung cancer Father   . Heart disease Brother     CAD  . Heart disease Brother     CAD, pre diabetic, obese  . Cancer Brother     Colon CA, liver mets at 63, DM    Allergies  Allergen Reactions  . Codeine     REACTION: syncope  . Sumatriptan     REACTION: choking sensation    Current Outpatient Prescriptions on File Prior to Visit  Medication Sig Dispense Refill  . aspirin 81 MG tablet Take 81 mg by mouth daily.     . Brinzolamide-Brimonidine (SIMBRINZA) 1-0.2 % SUSP Place 1 drop into the left eye daily.    . Calcium Carbonate (CALCIUM 500 PO) Take 1 capsule by mouth daily.      Marland Kitchen docusate sodium (COLACE) 100 MG capsule Take 100 mg by mouth daily.    . ferrous sulfate 325 (65 FE) MG tablet Take 325 mg by mouth daily.    . fluticasone (FLONASE) 50 MCG/ACT nasal spray USE  ONE SPRAY IN THE NOSE ONCE DAILY AS DIRECTED 16 g 11  . meloxicam (MOBIC) 15 MG tablet TAKE ONE TABLET BY MOUTH ONCE DAILY 90 tablet 3  . Multiple Vitamins-Minerals (ONE-A-DAY WOMENS 50 PLUS PO) Take 1 tablet by mouth daily.      . prednisoLONE acetate (PRED FORTE) 1 % ophthalmic suspension Place 1 drop into the left eye 3 (three) times daily.     Marland Kitchen zoster vaccine live, PF, (ZOSTAVAX) 11572 UNT/0.65ML injection Inject 19,400 Units into the skin once. 1 vial 0   No current facility-administered medications on file prior to visit.    BP 120/62 mmHg  Pulse 67  Temp(Src) 97.4 F (36.3 C) (Oral)  Ht 5' 3.25" (1.607 m)  Wt 148 lb 12.8 oz (67.495 kg)  BMI 26.14 kg/m2  SpO2 99%    Objective:   Physical Exam  Constitutional: She appears well-nourished. She appears ill.  HENT:  Right Ear: Tympanic membrane and ear canal normal.  Left Ear: Tympanic membrane and ear canal normal.  Nose: Right sinus exhibits  no maxillary sinus tenderness and no frontal sinus tenderness. Left sinus exhibits no maxillary sinus tenderness and no frontal sinus tenderness.  Mouth/Throat: Oropharynx is clear and moist.  Eyes: Conjunctivae are normal. Pupils are equal, round, and reactive to light.  Neck: Neck supple.  Cardiovascular: Normal rate and regular rhythm.   Pulmonary/Chest: Effort normal and breath sounds normal.  Lymphadenopathy:    She has no cervical adenopathy.  Skin: Skin is warm and dry.          Assessment & Plan:  Cough:  Present for 8 days, worsening cough and weakness yesterday. Suspect bacterial due to presentation, exam, and duration. RX for Zpak and tessalon pearls. Continue Nyquil at night as she is allergic to codeine. Push fluids. Follow up PRN.

## 2014-10-27 ENCOUNTER — Telehealth: Payer: Self-pay | Admitting: Family Medicine

## 2014-10-27 DIAGNOSIS — Z0000 Encounter for general adult medical examination without abnormal findings: Secondary | ICD-10-CM

## 2014-10-27 NOTE — Telephone Encounter (Signed)
-----   Message from Ellamae Sia sent at 10/19/2014  6:09 PM EDT ----- Regarding: Lab orders for Friday, 6.24.16 Patient is scheduled for CPX labs, please order future labs, Thanks , Karna Christmas

## 2014-10-28 ENCOUNTER — Other Ambulatory Visit (INDEPENDENT_AMBULATORY_CARE_PROVIDER_SITE_OTHER): Payer: BC Managed Care – PPO

## 2014-10-28 DIAGNOSIS — Z0000 Encounter for general adult medical examination without abnormal findings: Secondary | ICD-10-CM

## 2014-10-28 LAB — CBC WITH DIFFERENTIAL/PLATELET
BASOS ABS: 0 10*3/uL (ref 0.0–0.1)
BASOS PCT: 0.5 % (ref 0.0–3.0)
Eosinophils Absolute: 0.2 10*3/uL (ref 0.0–0.7)
Eosinophils Relative: 3.4 % (ref 0.0–5.0)
HEMATOCRIT: 36.4 % (ref 36.0–46.0)
HEMOGLOBIN: 12.2 g/dL (ref 12.0–15.0)
LYMPHS ABS: 2.4 10*3/uL (ref 0.7–4.0)
Lymphocytes Relative: 39.4 % (ref 12.0–46.0)
MCHC: 33.5 g/dL (ref 30.0–36.0)
MCV: 92.4 fl (ref 78.0–100.0)
MONO ABS: 0.6 10*3/uL (ref 0.1–1.0)
MONOS PCT: 9.9 % (ref 3.0–12.0)
NEUTROS ABS: 2.9 10*3/uL (ref 1.4–7.7)
Neutrophils Relative %: 46.8 % (ref 43.0–77.0)
Platelets: 343 10*3/uL (ref 150.0–400.0)
RBC: 3.94 Mil/uL (ref 3.87–5.11)
RDW: 13.5 % (ref 11.5–15.5)
WBC: 6.2 10*3/uL (ref 4.0–10.5)

## 2014-10-28 LAB — COMPREHENSIVE METABOLIC PANEL
ALT: 32 U/L (ref 0–35)
AST: 22 U/L (ref 0–37)
Albumin: 4 g/dL (ref 3.5–5.2)
Alkaline Phosphatase: 82 U/L (ref 39–117)
BILIRUBIN TOTAL: 0.5 mg/dL (ref 0.2–1.2)
BUN: 19 mg/dL (ref 6–23)
CHLORIDE: 103 meq/L (ref 96–112)
CO2: 32 meq/L (ref 19–32)
CREATININE: 0.74 mg/dL (ref 0.40–1.20)
Calcium: 9.6 mg/dL (ref 8.4–10.5)
GFR: 84.79 mL/min (ref >60.00)
Glucose, Bld: 87 mg/dL (ref 70–99)
Potassium: 4.3 mEq/L (ref 3.5–5.1)
Sodium: 139 mEq/L (ref 135–145)
TOTAL PROTEIN: 7.4 g/dL (ref 6.0–8.3)

## 2014-10-28 LAB — LIPID PANEL
Cholesterol: 193 mg/dL (ref 0–200)
HDL: 64 mg/dL (ref >39.00)
LDL CALC: 114 mg/dL — AB (ref 0–99)
NonHDL: 129
Total CHOL/HDL Ratio: 3
Triglycerides: 76 mg/dL (ref 0.0–149.0)
VLDL: 15.2 mg/dL (ref 0.0–40.0)

## 2014-10-28 LAB — TSH: TSH: 2.21 u[IU]/mL (ref 0.35–4.50)

## 2014-11-04 ENCOUNTER — Ambulatory Visit (INDEPENDENT_AMBULATORY_CARE_PROVIDER_SITE_OTHER): Payer: BC Managed Care – PPO | Admitting: Family Medicine

## 2014-11-04 ENCOUNTER — Encounter: Payer: Self-pay | Admitting: Family Medicine

## 2014-11-04 VITALS — BP 118/70 | HR 67 | Temp 98.1°F | Ht 63.5 in | Wt 148.5 lb

## 2014-11-04 DIAGNOSIS — Z23 Encounter for immunization: Secondary | ICD-10-CM

## 2014-11-04 DIAGNOSIS — Z01419 Encounter for gynecological examination (general) (routine) without abnormal findings: Secondary | ICD-10-CM | POA: Insufficient documentation

## 2014-11-04 DIAGNOSIS — Z0000 Encounter for general adult medical examination without abnormal findings: Secondary | ICD-10-CM

## 2014-11-04 MED ORDER — MELOXICAM 15 MG PO TABS: 15.0000 mg | ORAL_TABLET | Freq: Every day | ORAL | Status: DC

## 2014-11-04 NOTE — Patient Instructions (Signed)
Tdap vaccine today  Make an appointment with Dr Lorelei Pont for your shoulder  Take care of yourself   Labs look good

## 2014-11-04 NOTE — Assessment & Plan Note (Signed)
Reviewed health habits including diet and exercise and skin cancer prevention Reviewed appropriate screening tests for age  Also reviewed health mt list, fam hx and immunization status , as well as social and family history   See HPI Labs reviewed Tdap vaccine today

## 2014-11-04 NOTE — Progress Notes (Signed)
Pre visit review using our clinic review tool, if applicable. No additional management support is needed unless otherwise documented below in the visit note. 

## 2014-11-04 NOTE — Progress Notes (Signed)
Subjective:    Patient ID: Megan Harrison, female    DOB: 1955/05/15, 59 y.o.   MRN: [id number]  HPI Here for health maintenance exam and to review chronic medical problems    Doing well overall  Goes on vacation in Oct   Wt is stable with bmi of 25  Hep C/ HIV screen-not interested in /not high risk   Td 4/06- due for Tdap vaccine   Flu shot- had it in the fall   Pap 7/14 nl - her obgyn just retired - had a hysterectomy (for endometriosis)  Will skip pap  No abn paps, no cervical cancer and no new partners  Has had a hysterectomy  Mammogram 1/16 nl  Self exam- no lumps or changes   colonosc 4/15- polypoid mucosa / fam history - has 5 year recall   Zoster vacc 7/15  Results for orders placed or performed in visit on 10/28/14  CBC with Differential/Platelet  Result Value Ref Range   WBC 6.2 4.0 - 10.5 K/uL   RBC 3.94 3.87 - 5.11 Mil/uL   Hemoglobin 12.2 12.0 - 15.0 g/dL   HCT 36.4 36.0 - 46.0 %   MCV 92.4 78.0 - 100.0 fl   MCHC 33.5 30.0 - 36.0 g/dL   RDW 13.5 11.5 - 15.5 %   Platelets 343.0 150.0 - 400.0 K/uL   Neutrophils Relative % 46.8 43.0 - 77.0 %   Lymphocytes Relative 39.4 12.0 - 46.0 %   Monocytes Relative 9.9 3.0 - 12.0 %   Eosinophils Relative 3.4 0.0 - 5.0 %   Basophils Relative 0.5 0.0 - 3.0 %   Neutro Abs 2.9 1.4 - 7.7 K/uL   Lymphs Abs 2.4 0.7 - 4.0 K/uL   Monocytes Absolute 0.6 0.1 - 1.0 K/uL   Eosinophils Absolute 0.2 0.0 - 0.7 K/uL   Basophils Absolute 0.0 0.0 - 0.1 K/uL  Comprehensive metabolic panel  Result Value Ref Range   Sodium 139 135 - 145 mEq/L   Potassium 4.3 3.5 - 5.1 mEq/L   Chloride 103 96 - 112 mEq/L   CO2 32 19 - 32 mEq/L   Glucose, Bld 87 70 - 99 mg/dL   BUN 19 6 - 23 mg/dL   Creatinine, Ser 0.74 0.40 - 1.20 mg/dL   Total Bilirubin 0.5 0.2 - 1.2 mg/dL   Alkaline Phosphatase 82 39 - 117 U/L   AST 22 0 - 37 U/L   ALT 32 0 - 35 U/L   Total Protein 7.4 6.0 - 8.3 g/dL   Albumin 4.0 3.5 - 5.2 g/dL   Calcium 9.6 8.4 - 10.5  mg/dL   GFR 84.79 >60.00 mL/min  Lipid panel  Result Value Ref Range   Cholesterol 193 0 - 200 mg/dL   Triglycerides 76.0 0.0 - 149.0 mg/dL   HDL 64.00 >39.00 mg/dL   VLDL 15.2 0.0 - 40.0 mg/dL   LDL Cholesterol 114 (H) 0 - 99 mg/dL   Total CHOL/HDL Ratio 3    NonHDL 129.00   TSH  Result Value Ref Range   TSH 2.21 0.35 - 4.50 uIU/mL     Diet - cheating a bit  Had to stop exercise for a bad cold - but back now / 5 d per week   Patient Active Problem List   Diagnosis Date Noted  . Encounter for routine gynecological examination 11/04/2014  . Bacterial vaginosis 07/15/2014  . Spinal stenosis of lumbar region 04/02/2013  . Hemorrhoids, internal 04/02/2013  . Low back  pain potentially associated with radiculopathy 03/29/2013  . Routine general medical examination at a health care facility 06/09/2011  . UNSPECIFIED ANEMIA 05/24/2010  . ALLERGIC RHINITIS 03/10/2009  . HERPES ZOSTER OPHTHALMICUS 08/28/2006  . MIGRAINES, HX OF 08/28/2006   Past Medical History  Diagnosis Date  . Anemia   . Cutaneous sarcoidosis   . Migraine   . Foot pain   . Zoster     Opthy (regular checks)  . Degenerative disc disease    Past Surgical History  Procedure Laterality Date  . Tonsillectomy    . Cesarean section      two times  . Colonoscopy  11/0/2009    diverticulosis and hemorrhoids  . Upper gastrointestinal endoscopy  11/15/2010    non-erosive gastritis, H. pylori negative  . Abdominal hysterectomy  11/99  . Arm lesion, cutaneous sarcoid  12/2005    with normal CXR and ANA  . 2d echo  1999    Negative   History  Substance Use Topics  . Smoking status: Never Smoker   . Smokeless tobacco: Never Used  . Alcohol Use: No   Family History  Problem Relation Age of Onset  . Colon cancer Brother   . Heart disease Mother     CAD  . Alcohol abuse Mother   . Lung cancer Father   . Heart disease Brother     CAD  . Heart disease Brother     CAD, pre diabetic, obese  . Cancer Brother      Colon CA, liver mets at 46, DM   Allergies  Allergen Reactions  . Codeine     REACTION: syncope  . Sumatriptan     REACTION: choking sensation   Current Outpatient Prescriptions on File Prior to Visit  Medication Sig Dispense Refill  . aspirin 81 MG tablet Take 81 mg by mouth daily.     . Brinzolamide-Brimonidine (SIMBRINZA) 1-0.2 % SUSP Place 1 drop into the left eye daily.    . Calcium Carbonate (CALCIUM 500 PO) Take 1 capsule by mouth daily.      Marland Kitchen docusate sodium (COLACE) 100 MG capsule Take 100 mg by mouth daily.    . ferrous sulfate 325 (65 FE) MG tablet Take 325 mg by mouth daily.    . fluticasone (FLONASE) 50 MCG/ACT nasal spray USE ONE SPRAY IN THE NOSE ONCE DAILY AS DIRECTED 16 g 11  . meloxicam (MOBIC) 15 MG tablet TAKE ONE TABLET BY MOUTH ONCE DAILY 90 tablet 3  . Multiple Vitamins-Minerals (ONE-A-DAY WOMENS 50 PLUS PO) Take 1 tablet by mouth daily.      . prednisoLONE acetate (PRED FORTE) 1 % ophthalmic suspension Place 1 drop into the left eye 3 (three) times daily.      No current facility-administered medications on file prior to visit.      Review of Systems    Review of Systems  Constitutional: Negative for fever, appetite change, fatigue and unexpected weight change.  Eyes: Negative for pain and visual disturbance.  Respiratory: Negative for cough and shortness of breath.   Cardiovascular: Negative for cp or palpitations    Gastrointestinal: Negative for nausea, diarrhea and constipation.  Genitourinary: Negative for urgency and frequency.  Skin: Negative for pallor or rash   MSK pos for L shoulder pain with limited rom for over a year  Neurological: Negative for weakness, light-headedness, numbness and headaches.  Hematological: Negative for adenopathy. Does not bruise/bleed easily.  Psychiatric/Behavioral: Negative for dysphoric mood. The patient is not nervous/anxious.  Objective:   Physical Exam  Constitutional: She appears well-developed and  well-nourished. No distress.  HENT:  Head: Normocephalic and atraumatic.  Right Ear: External ear normal.  Left Ear: External ear normal.  Mouth/Throat: Oropharynx is clear and moist.  Eyes: Conjunctivae and EOM are normal. Pupils are equal, round, and reactive to light. No scleral icterus.  Neck: Normal range of motion. Neck supple. No JVD present. Carotid bruit is not present. No thyromegaly present.  Cardiovascular: Normal rate, regular rhythm, normal heart sounds and intact distal pulses.  Exam reveals no gallop.   Pulmonary/Chest: Effort normal and breath sounds normal. No respiratory distress. She has no wheezes. She exhibits no tenderness.  Abdominal: Soft. Bowel sounds are normal. She exhibits no distension, no abdominal bruit and no mass. There is no tenderness.  Genitourinary: No breast swelling, tenderness, discharge or bleeding.  Breast exam: No mass, nodules, thickening, tenderness, bulging, retraction, inflamation, nipple discharge or skin changes noted.  No axillary or clavicular LA.      Musculoskeletal: She exhibits no edema or tenderness.  Limited rom L shoulder  Pos Hawking test   Lymphadenopathy:    She has no cervical adenopathy.  Neurological: She is alert. She has normal reflexes. No cranial nerve deficit. She exhibits normal muscle tone. Coordination normal.  Skin: Skin is warm and dry. No rash noted. No erythema. No pallor.  Psychiatric: She has a normal mood and affect.          Assessment & Plan:   Problem List Items Addressed This Visit    Routine general medical examination at a health care facility - Primary    Reviewed health habits including diet and exercise and skin cancer prevention Reviewed appropriate screening tests for age  Also reviewed health mt list, fam hx and immunization status , as well as social and family history   See HPI Labs reviewed Tdap vaccine today

## 2014-11-16 ENCOUNTER — Encounter: Payer: Self-pay | Admitting: Family Medicine

## 2014-11-16 ENCOUNTER — Ambulatory Visit (INDEPENDENT_AMBULATORY_CARE_PROVIDER_SITE_OTHER): Payer: BC Managed Care – PPO | Admitting: Family Medicine

## 2014-11-16 VITALS — BP 120/70 | HR 65 | Temp 98.3°F | Ht 63.5 in | Wt 149.5 lb

## 2014-11-16 DIAGNOSIS — M67912 Unspecified disorder of synovium and tendon, left shoulder: Secondary | ICD-10-CM

## 2014-11-16 DIAGNOSIS — M7542 Impingement syndrome of left shoulder: Secondary | ICD-10-CM

## 2014-11-16 NOTE — Progress Notes (Signed)
Pre visit review using our clinic review tool, if applicable. No additional management support is needed unless otherwise documented below in the visit note. 

## 2014-11-16 NOTE — Progress Notes (Signed)
Dr. Frederico Hamman T. Temprance Wyre, MD, [facility] [place], [address] Phone: [phone] Fax: [phone]  11/16/2014  Patient: Megan Harrison, MRN: [id number], DOB: [birth year], 59 y.o.  Primary Physician:  Loura Pardon, MD  Chief Complaint: Shoulder Pain  Subjective:   Megan Harrison is a 59 y.o. very pleasant female patient who presents with the following:  L shoulder, achy for a year, burning and deep pain. Pain with lifting and reaching behind. No injury.  No numbness or tingling.   He has been worsening in the last 2 months, and she is having pain with abduction and with internal range of motion.  She has a dull achy sensation and some pain in a T-shirt distribution.  She has not had any traumatic injury, and she has never had any prior fracture operative intervention in the affected hand.   Past Medical History, Surgical History, Social History, Family History, Problem List, Medications, and Allergies have been reviewed and updated if relevant.  Patient Active Problem List   Diagnosis Date Noted  . Encounter for routine gynecological examination 11/04/2014  . Spinal stenosis of lumbar region 04/02/2013  . Hemorrhoids, internal 04/02/2013  . Low back pain potentially associated with radiculopathy 03/29/2013  . Routine general medical examination at a health care facility 06/09/2011  . ALLERGIC RHINITIS 03/10/2009  . HERPES ZOSTER OPHTHALMICUS 08/28/2006  . MIGRAINES, HX OF 08/28/2006    Past Medical History  Diagnosis Date  . Anemia   . Cutaneous sarcoidosis   . Migraine   . Foot pain   . Zoster     Opthy (regular checks)  . Degenerative disc disease     Past Surgical History  Procedure Laterality Date  . Tonsillectomy    . Cesarean section      two times  . Colonoscopy  11/0/2009    diverticulosis and hemorrhoids  . Upper gastrointestinal endoscopy  11/15/2010    non-erosive gastritis, H. pylori  negative  . Abdominal hysterectomy  11/99  . Arm lesion, cutaneous sarcoid  12/2005    with normal CXR and ANA  . 2d echo  1999    Negative    History   Social History  . Marital Status: Married    Spouse Name: N/A  . Number of Children: N/A  . Years of Education: N/A   Occupational History  . Works for [employer]    Social History Main Topics  . Smoking status: Never Smoker   . Smokeless tobacco: Never Used  . Alcohol Use: No  . Drug Use: No  . Sexual Activity: Not on file   Other Topics Concern  . Not on file   Social History Narrative   Regular exercise:  Curves, elliptical, walking.    Family History  Problem Relation Age of Onset  . Colon cancer Brother   . Heart disease Mother     CAD  . Alcohol abuse Mother   . Lung cancer Father   . Heart disease Brother     CAD  . Heart disease Brother     CAD, pre diabetic, obese  . Cancer Brother     Colon CA, liver mets at 10, DM    Allergies  Allergen Reactions  . Codeine     REACTION: syncope  . Sumatriptan     REACTION: choking sensation    Medication list reviewed and updated in full in [place].  GEN: No fevers, chills. Nontoxic.  Primarily MSK c/o today. MSK: Detailed in the HPI GI: tolerating PO intake without difficulty Neuro: No numbness, parasthesias, or tingling associated. Otherwise the pertinent positives of the ROS are noted above.   Objective:   BP 120/70 mmHg  Pulse 65  Temp(Src) 98.3 F (36.8 C) (Oral)  Ht 5' 3.5" (1.613 m)  Wt 149 lb 8 oz (67.813 kg)  BMI 26.06 kg/m2   GEN: Well-developed,well-nourished,in no acute distress; alert,appropriate and cooperative throughout examination HEENT: Normocephalic and atraumatic without obvious abnormalities. Ears, externally no deformities PULM: Breathing comfortably in no respiratory distress EXT: No clubbing, cyanosis, or edema PSYCH: Normally interactive. Cooperative during the interview. Pleasant. Friendly and conversant.  Not anxious or depressed appearing. Normal, full affect.  Shoulder: L Inspection: No muscle wasting or winging Ecchymosis/edema: neg  AC joint, scapula, clavicle: NT Cervical spine: NT, full ROM Spurling's: neg Abduction: full, 5/5, painful arc mild Flexion: full, 5/5 IR, full, lift-off: 5/5, lack of IROM on the L by 40 deg compared to R ER at neutral: full, 5/5 AC crossover: neg Neer: pos, mild Hawkins: pos Drop Test: neg Empty Can: neg Supraspinatus insertion: nt Bicipital groove: NT Speed's: neg Yergason's: neg Sulcus sign: neg Scapular dyskinesis: none C5-T1 intact  Neuro: Sensation intact Grip 5/5   Radiology: No results found.  Assessment and Plan:   Shoulder impingement, left - Plan: Ambulatory referral to Physical Therapy  Tendinopathy of rotator cuff, left - Plan: Ambulatory referral to Physical Therapy  Mild-mod RTC tendinopathy Suspect  GIRD (Glenohumeral Internal Rotational Deficiency) as a component, too.  >25 minutes spent in face to face time with patient, >50% spent in counselling or coordination of care; eval, discussion of anatomy, rehab. Using an anatomical model, I reviewed with the patient the structures involved and how they related to their diagnosis . Work on Radio broadcast assistant and formal PT.  Follow-up: Return in about 8 weeks (around 01/11/2015).  New Prescriptions   No medications on file   Orders Placed This Encounter  Procedures  . Ambulatory referral to Physical Therapy    Signed,  Frederico Hamman T. Shonna Deiter, MD   Patient's Medications  New Prescriptions   No medications on file  Previous Medications   ASPIRIN 81 MG TABLET    Take 81 mg by mouth daily.    BRINZOLAMIDE-BRIMONIDINE (SIMBRINZA) 1-0.2 % SUSP    Place 1 drop into the left eye daily.   CALCIUM CARBONATE (CALCIUM 500 PO)    Take 1 capsule by mouth daily.     DOCUSATE SODIUM (COLACE) 100 MG CAPSULE    Take 100 mg by mouth daily.   FERROUS SULFATE 325 (65 FE) MG TABLET    Take  325 mg by mouth daily.   FLUTICASONE (FLONASE) 50 MCG/ACT NASAL SPRAY    USE ONE SPRAY IN THE NOSE ONCE DAILY AS DIRECTED   MELOXICAM (MOBIC) 15 MG TABLET    Take 1 tablet (15 mg total) by mouth daily. With food   MULTIPLE VITAMINS-MINERALS (ONE-A-DAY WOMENS 50 PLUS PO)    Take 1 tablet by mouth daily.     PREDNISOLONE ACETATE (PRED FORTE) 1 % OPHTHALMIC SUSPENSION    Place 1 drop into the left eye 3 (three) times daily.   Modified Medications   No medications on file  Discontinued Medications   No medications on file

## 2014-11-21 ENCOUNTER — Ambulatory Visit: Payer: BC Managed Care – PPO | Admitting: Podiatry

## 2014-12-09 ENCOUNTER — Encounter: Payer: Self-pay | Admitting: Internal Medicine

## 2014-12-22 ENCOUNTER — Other Ambulatory Visit: Payer: Self-pay | Admitting: Family Medicine

## 2015-01-13 ENCOUNTER — Ambulatory Visit: Payer: BC Managed Care – PPO | Admitting: Family Medicine

## 2015-01-16 ENCOUNTER — Ambulatory Visit (INDEPENDENT_AMBULATORY_CARE_PROVIDER_SITE_OTHER): Payer: BC Managed Care – PPO | Admitting: Family Medicine

## 2015-01-16 ENCOUNTER — Encounter: Payer: Self-pay | Admitting: Family Medicine

## 2015-01-16 ENCOUNTER — Ambulatory Visit (INDEPENDENT_AMBULATORY_CARE_PROVIDER_SITE_OTHER)
Admission: RE | Admit: 2015-01-16 | Discharge: 2015-01-16 | Disposition: A | Discharge status: Home / Self Care | Payer: BC Managed Care – PPO | Source: Ambulatory Visit | Attending: Family Medicine | Admitting: Family Medicine

## 2015-01-16 VITALS — BP 120/58 | HR 83 | Temp 98.6°F | Ht 63.5 in | Wt 155.8 lb

## 2015-01-16 DIAGNOSIS — M67912 Unspecified disorder of synovium and tendon, left shoulder: Secondary | ICD-10-CM

## 2015-01-16 DIAGNOSIS — M7542 Impingement syndrome of left shoulder: Secondary | ICD-10-CM

## 2015-01-16 MED ORDER — METHYLPREDNISOLONE ACETATE 40 MG/ML IJ SUSP
80.0000 mg | Freq: Once | INTRAMUSCULAR | Status: AC
  Administered 2015-01-16: 80 mg via INTRA_ARTICULAR

## 2015-01-16 NOTE — Progress Notes (Signed)
Pre visit review using our clinic review tool, if applicable. No additional management support is needed unless otherwise documented below in the visit note. 

## 2015-01-16 NOTE — Progress Notes (Signed)
Dr. Frederico Hamman T. Ruqaya Strauss, MD, [facility] Medicine [place], [address] Phone: [phone] Fax: [phone]  01/16/2015  Patient: Megan Harrison, MRN: [id number], DOB: 15-Oct-1955, 59 y.o.  Primary Physician:  Loura Pardon, MD  Chief Complaint: Follow-up  Subjective:   Megan Harrison is a 59 y.o. very pleasant female patient who presents with the following:  Left shoulder, ongoing rotator cuff tendinopathy and subacromial bursitis as well as impingement.  She continues to not do well, injection is doing worse compared to her prior visit.  She's been diligent about doing her formal physical therapy and doing her home exercise program.  She is taking multiple anti-inflammatories and Tylenol.  Ice and heat seemed to not really help significantly.  At this point, the patient has not tried a subacromial injection.  Shoulder - keeping up at night, has been going to PT and not really making any improvement.  All left shoulder.   L sub ac injection  11/16/2014 Last OV with Owens Loffler, MD  L shoulder, achy for a year, burning and deep pain. Pain with lifting and reaching behind. No injury.  No numbness or tingling.   He has been worsening in the last 2 months, and she is having pain with abduction and with internal range of motion.  She has a dull achy sensation and some pain in a T-shirt distribution.  She has not had any traumatic injury, and she has never had any prior fracture operative intervention in the affected hand.   Past Medical History, Surgical History, Social History, Family History, Problem List, Medications, and Allergies have been reviewed and updated if relevant.  Patient Active Problem List   Diagnosis Date Noted  . Encounter for routine gynecological examination 11/04/2014  . Spinal stenosis of lumbar region 04/02/2013  . Hemorrhoids, internal 04/02/2013  . Low back pain potentially associated with radiculopathy  03/29/2013  . Routine general medical examination at a health care facility 06/09/2011  . ALLERGIC RHINITIS 03/10/2009  . HERPES ZOSTER OPHTHALMICUS 08/28/2006  . MIGRAINES, HX OF 08/28/2006    Past Medical History  Diagnosis Date  . Anemia   . Cutaneous sarcoidosis   . Migraine   . Foot pain   . Zoster     Opthy (regular checks)  . Degenerative disc disease     Past Surgical History  Procedure Laterality Date  . Tonsillectomy    . Cesarean section      two times  . Colonoscopy  11/0/2009    diverticulosis and hemorrhoids  . Upper gastrointestinal endoscopy  11/15/2010    non-erosive gastritis, H. pylori negative  . Abdominal hysterectomy  11/99  . Arm lesion, cutaneous sarcoid  12/2005    with normal CXR and ANA  . 2d echo  1999    Negative    Social History   Social History  . Marital Status: Married    Spouse Name: N/A  . Number of Children: N/A  . Years of Education: N/A   Occupational History  . Works for [employer]    Social History Main Topics  . Smoking status: Never Smoker   . Smokeless tobacco: Never Used  . Alcohol Use: No  . Drug Use: No  . Sexual Activity: Not on file   Other Topics Concern  . Not on file   Social History Narrative   Regular exercise:  Curves, elliptical, walking.    Family History  Problem Relation Age of Onset  .  Colon cancer Brother   . Heart disease Mother     CAD  . Alcohol abuse Mother   . Lung cancer Father   . Heart disease Brother     CAD  . Heart disease Brother     CAD, pre diabetic, obese  . Cancer Brother     Colon CA, liver mets at 67, DM    Allergies  Allergen Reactions  . Codeine     REACTION: syncope  . Sumatriptan     REACTION: choking sensation    Medication list reviewed and updated in full in [facility].  GEN: No fevers, chills. Nontoxic. Primarily MSK c/o today. MSK: Detailed in the HPI GI: tolerating PO intake without difficulty Neuro: No numbness, parasthesias, or  tingling associated. Otherwise the pertinent positives of the ROS are noted above.   Objective:   BP 120/58 mmHg  Pulse 83  Temp(Src) 98.6 F (37 C) (Oral)  Ht 5' 3.5" (1.613 m)  Wt 155 lb 12 oz (70.648 kg)  BMI 27.15 kg/m2   GEN: Well-developed,well-nourished,in no acute distress; alert,appropriate and cooperative throughout examination HEENT: Normocephalic and atraumatic without obvious abnormalities. Ears, externally no deformities PULM: Breathing comfortably in no respiratory distress EXT: No clubbing, cyanosis, or edema PSYCH: Normally interactive. Cooperative during the interview. Pleasant. Friendly and conversant. Not anxious or depressed appearing. Normal, full affect.  Shoulder: L Inspection: No muscle wasting or winging Ecchymosis/edema: neg  AC joint, scapula, clavicle: NT Cervical spine: NT, full ROM Spurling's: neg Abduction: full, 5/5, painful arc mild Flexion: full, 5/5 IR, full, lift-off: 5/5 ER at neutral: full, 5/5 AC crossover: neg Neer: pos, mild Hawkins: pos Drop Test: neg Empty Can: pos Supraspinatus insertion: nt Bicipital groove: NT Speed's: pos Yergason's: pos Sulcus sign: neg Scapular dyskinesis: none C5-T1 intact  Neuro: Sensation intact Grip 5/5   Radiology: Dg Shoulder Left  01/16/2015   CLINICAL DATA:  Left shoulder pain. No known injury. Initial evaluation .  EXAM: LEFT SHOULDER - 2+ VIEW  COMPARISON:  None.  FINDINGS: No acute bony or joint abnormality identified. Acromioclavicular glenohumeral degenerative change. No acute bony abnormality.  IMPRESSION: Diffuse degenerative change.  No acute abnormality.   Electronically Signed   By: Marcello Moores  Register   On: 01/16/2015 16:24    The radiological images were independently reviewed by myself in the office and results were reviewed with the patient. My independent interpretation of images:  The glenohumeral joint is preserved.  There is minimal glenohumeral joint osteoarthritic change in my  opinion.  A.c. Joint is at least moderate osteoarthritic change.  There are no fractures. Electronically Signed  By: Owens Loffler, MD On: 01/17/2015 4:45 PM   Assessment and Plan:   Shoulder impingement, left - Plan: DG Shoulder Left, methylPREDNISolone acetate (DEPO-MEDROL) injection 80 mg  Tendinopathy of rotator cuff, left - Plan: DG Shoulder Left, methylPREDNISolone acetate (DEPO-MEDROL) injection 80 mg  At this point, the patient has been quite diligent with her conservative care.  I think it is reasonable to do a trial of a subacromial injection to see if she gets pain relief so that she can have better results from her rehabilitation.  Close follow-up in about 6 weeks.  SubAC Injection, L Verbal consent was obtained from the patient. Risks (including rare infection), benefits, and alternatives were explained. Patient prepped with Chloraprep and Ethyl Chloride used for anesthesia. The subacromial space was injected using the posterior approach. The patient tolerated the procedure well and had decreased pain post injection. No complications.  Injection: 8 cc of Lidocaine 1% and Depo-Medrol 80 mg. Needle: 22 gauge   Follow-up: Return for Dr. Lorelei Pont 5-6 weeks.  New Prescriptions   No medications on file   Orders Placed This Encounter  Procedures  . DG Shoulder Left    Signed,  Frederico Hamman T. Griselda Tosh, MD   Patient's Medications  New Prescriptions   No medications on file  Previous Medications   ASPIRIN 81 MG TABLET    Take 81 mg by mouth daily.    BRINZOLAMIDE-BRIMONIDINE (SIMBRINZA) 1-0.2 % SUSP    Place 1 drop into the left eye daily.   CALCIUM CARBONATE (CALCIUM 500 PO)    Take 1 capsule by mouth daily.     DOCUSATE SODIUM (COLACE) 100 MG CAPSULE    Take 100 mg by mouth daily.   FERROUS SULFATE 325 (65 FE) MG TABLET    Take 325 mg by mouth daily.   FLUTICASONE (FLONASE) 50 MCG/ACT NASAL SPRAY    USE ONE SPRAY(S) IN EACH NOSTRIL ONCE DAILY AS DIRECTED   MELOXICAM (MOBIC)  15 MG TABLET    Take 1 tablet (15 mg total) by mouth daily. With food   MULTIPLE VITAMINS-MINERALS (ONE-A-DAY WOMENS 50 PLUS PO)    Take 1 tablet by mouth daily.     PREDNISOLONE ACETATE (PRED FORTE) 1 % OPHTHALMIC SUSPENSION    Place 1 drop into the left eye 3 (three) times daily.   Modified Medications   No medications on file  Discontinued Medications   No medications on file

## 2015-02-20 ENCOUNTER — Ambulatory Visit: Payer: BC Managed Care – PPO | Admitting: Family Medicine

## 2015-02-20 ENCOUNTER — Telehealth: Payer: Self-pay | Admitting: Family Medicine

## 2015-02-20 NOTE — Telephone Encounter (Signed)
Pt was scheduled for an appointment in our office today, but was erroneously put with the PCP instead of follow up provider.  Pt had injection in left shoulder and is feeling almost 100%.  Pt would like to call us to schedule follow up with Dr. Lorelei Pont as needed.  Best number to call pt is [phone]

## 2015-02-20 NOTE — Telephone Encounter (Signed)
Can you call patient and check on - I am very, very sorry about the scheduling error.   I totally remember her last exam, and it was very painful. I wanted to make sure she was improving. If she is approaching 100% better, then I think she can only f/u with me on an as needed basis.  I am glad she is better!

## 2015-02-20 NOTE — Telephone Encounter (Signed)
Ms. Telford notified as instructed by telephone.  She states she is 95% better.  She is very pleased with her progress.  She is continuing with her PT.  She is very pleased with the outcome and she will follow up as needed.

## 2015-05-19 ENCOUNTER — Other Ambulatory Visit: Payer: Self-pay

## 2015-05-19 DIAGNOSIS — Z1231 Encounter for screening mammogram for malignant neoplasm of breast: Secondary | ICD-10-CM

## 2015-05-24 ENCOUNTER — Ambulatory Visit (INDEPENDENT_AMBULATORY_CARE_PROVIDER_SITE_OTHER): Payer: BC Managed Care – PPO | Admitting: Family Medicine

## 2015-05-24 ENCOUNTER — Encounter: Payer: Self-pay | Admitting: Family Medicine

## 2015-05-24 VITALS — BP 120/66 | HR 74 | Temp 98.1°F | Ht 63.5 in | Wt 155.0 lb

## 2015-05-24 DIAGNOSIS — M25512 Pain in left shoulder: Secondary | ICD-10-CM | POA: Diagnosis not present

## 2015-05-24 DIAGNOSIS — M67912 Unspecified disorder of synovium and tendon, left shoulder: Secondary | ICD-10-CM | POA: Diagnosis not present

## 2015-05-24 DIAGNOSIS — M7542 Impingement syndrome of left shoulder: Secondary | ICD-10-CM

## 2015-05-24 NOTE — Progress Notes (Signed)
Dr. Frederico Hamman T. Ikhlas Albo, MD, [facility] Medicine [place], [address] Phone: [phone] Fax: [phone]  05/24/2015  Patient: Megan Harrison, MRN: [id number], DOB: 1956/02/08, 60 y.o.  Primary Physician:  Loura Pardon, MD  Chief Complaint: Shoulder Pain  Subjective:   Megan Harrison is a 60 y.o. very pleasant female patient who presents with the following:  F/u L shoulder: Now painful to do her HEP now. Trouble sleeping at night - left shoulder is bothering her quite a bit.   The patient was lost to follow-up for about 4-5 months, but overall she has had ongoing left shoulder pain that is been waxing and waning for about 18 months. She has done formal physical therapy, taken multiple types of anti-inflammatories, Tylenol, ice, heat, and we also did a subacromial injection. She still has significant impairment and pain that is bothering her at night and limiting her activities.  01/16/2015 Last OV with Owens Loffler, MD  Left shoulder, ongoing rotator cuff tendinopathy and subacromial bursitis as well as impingement.  She continues to not do well, injection is doing worse compared to her prior visit.  She's been diligent about doing her formal physical therapy and doing her home exercise program.  She is taking multiple anti-inflammatories and Tylenol.  Ice and heat seemed to not really help significantly.  At this point, the patient has not tried a subacromial injection.  Shoulder - keeping up at night, has been going to PT and not really making any improvement.  All left shoulder.   L sub ac injection  11/16/2014 Last OV with Owens Loffler, MD  L shoulder, achy for a year, burning and deep pain. Pain with lifting and reaching behind. No injury.  No numbness or tingling.   He has been worsening in the last 2 months, and she is having pain with abduction and with internal range of motion.  She has a dull achy sensation and some  pain in a T-shirt distribution.  She has not had any traumatic injury, and she has never had any prior fracture operative intervention in the affected hand.   Past Medical History, Surgical History, Social History, Family History, Problem List, Medications, and Allergies have been reviewed and updated if relevant.  Patient Active Problem List   Diagnosis Date Noted  . Encounter for routine gynecological examination 11/04/2014  . Spinal stenosis of lumbar region 04/02/2013  . Hemorrhoids, internal 04/02/2013  . Low back pain potentially associated with radiculopathy 03/29/2013  . Routine general medical examination at a health care facility 06/09/2011  . ALLERGIC RHINITIS 03/10/2009  . HERPES ZOSTER OPHTHALMICUS 08/28/2006  . MIGRAINES, HX OF 08/28/2006    Past Medical History  Diagnosis Date  . Anemia   . Cutaneous sarcoidosis ([place])   . Migraine   . Foot pain   . Zoster     Opthy (regular checks)  . Degenerative disc disease     Past Surgical History  Procedure Laterality Date  . Tonsillectomy    . Cesarean section      two times  . Colonoscopy  11/0/2009    diverticulosis and hemorrhoids  . Upper gastrointestinal endoscopy  11/15/2010    non-erosive gastritis, H. pylori negative  . Abdominal hysterectomy  11/99  . Arm lesion, cutaneous sarcoid  12/2005    with normal CXR and ANA  . 2d echo  1999    Negative    Social History   Social History  .  Marital Status: Married    Spouse Name: N/A  . Number of Children: N/A  . Years of Education: N/A   Occupational History  . Works for [employer]    Social History Main Topics  . Smoking status: Never Smoker   . Smokeless tobacco: Never Used  . Alcohol Use: No  . Drug Use: No  . Sexual Activity: Not on file   Other Topics Concern  . Not on file   Social History Narrative   Regular exercise:  Curves, elliptical, walking.    Family History  Problem Relation Age of Onset  . Colon cancer Brother   .  Heart disease Mother     CAD  . Alcohol abuse Mother   . Lung cancer Father   . Heart disease Brother     CAD  . Heart disease Brother     CAD, pre diabetic, obese  . Cancer Brother     Colon CA, liver mets at 27, DM    Allergies  Allergen Reactions  . Codeine     REACTION: syncope  . Sumatriptan     REACTION: choking sensation    Medication list reviewed and updated in full in [facility].  GEN: No fevers, chills. Nontoxic. Primarily MSK c/o today. MSK: Detailed in the HPI GI: tolerating PO intake without difficulty Neuro: No numbness, parasthesias, or tingling associated. Otherwise the pertinent positives of the ROS are noted above.   Objective:   BP 120/66 mmHg  Pulse 74  Temp(Src) 98.1 F (36.7 C) (Oral)  Ht 5' 3.5" (1.613 m)  Wt 155 lb (70.308 kg)  BMI 27.02 kg/m2   GEN: Well-developed,well-nourished,in no acute distress; alert,appropriate and cooperative throughout examination HEENT: Normocephalic and atraumatic without obvious abnormalities. Ears, externally no deformities PULM: Breathing comfortably in no respiratory distress EXT: No clubbing, cyanosis, or edema PSYCH: Normally interactive. Cooperative during the interview. Pleasant. Friendly and conversant. Not anxious or depressed appearing. Normal, full affect.  Shoulder: L Inspection: No muscle wasting or winging Ecchymosis/edema: neg  AC joint, scapula, clavicle: NT Cervical spine: NT, full ROM Spurling's: neg Abduction: full, 5/5, painful arc mild Flexion: full, 5/5 IR, full, lift-off: 5/5 ER at neutral: full, 5/5 AC crossover: neg Neer: pos, mild Hawkins: pos Drop Test: neg Empty Can: pos Supraspinatus insertion: nt Bicipital groove: NT Speed's: pos Yergason's: pos Sulcus sign: neg Scapular dyskinesis: none C5-T1 intact  Neuro: Sensation intact Grip 5/5   Radiology: No results found.  The radiological images were independently reviewed by myself in the office and results  were reviewed with the patient. My independent interpretation of images:  The glenohumeral joint is preserved.  There is minimal glenohumeral joint osteoarthritic change in my opinion.  A.c. Joint is at least moderate osteoarthritic change.  There are no fractures. Electronically Signed  By: Owens Loffler, MD On: 05/24/2015 9:42 AM   Assessment and Plan:   Left shoulder pain - Plan: MR Shoulder Left Wo Contrast, Ambulatory referral to Orthopedic Surgery  Shoulder impingement, left - Plan: MR Shoulder Left Wo Contrast, Ambulatory referral to Orthopedic Surgery  Tendinopathy of rotator cuff, left - Plan: MR Shoulder Left Wo Contrast, Ambulatory referral to Orthopedic Surgery  Failure of conservative management for more than one year with ongoing shoulder pain. Obtain an MRI of the left shoulder to evaluate the patient's rotator cuff, acromioclavicular joint, glenohumeral joint, subacromial space prior to surgical consultation. Consult Dr. Tamera Punt for his expertise in a patient failure to improve with conservative care.  Follow-up: with  shoulder surgeon  Orders Placed This Encounter  Procedures  . MR Shoulder Left Wo Contrast  . Ambulatory referral to Orthopedic Surgery    Signed,  Elka Satterfield T. Alexa Blish, MD   Patient's Medications  New Prescriptions   No medications on file  Previous Medications   ASPIRIN 81 MG TABLET    Take 81 mg by mouth daily.    BRINZOLAMIDE-BRIMONIDINE (SIMBRINZA) 1-0.2 % SUSP    Place 1 drop into the left eye daily.   CALCIUM CARBONATE (CALCIUM 500 PO)    Take 1 capsule by mouth daily.     DOCUSATE SODIUM (COLACE) 100 MG CAPSULE    Take 100 mg by mouth daily.   FERROUS SULFATE 325 (65 FE) MG TABLET    Take 325 mg by mouth daily.   FLUTICASONE (FLONASE) 50 MCG/ACT NASAL SPRAY    USE ONE SPRAY(S) IN EACH NOSTRIL ONCE DAILY AS DIRECTED   MELOXICAM (MOBIC) 15 MG TABLET    Take 1 tablet (15 mg total) by mouth daily. With food   MULTIPLE VITAMINS-MINERALS (ONE-A-DAY  WOMENS 50 PLUS PO)    Take 1 tablet by mouth daily.     PREDNISOLONE ACETATE (PRED FORTE) 1 % OPHTHALMIC SUSPENSION    Place 1 drop into the left eye 3 (three) times daily.   Modified Medications   No medications on file  Discontinued Medications   No medications on file

## 2015-05-24 NOTE — Patient Instructions (Signed)

## 2015-05-24 NOTE — Progress Notes (Signed)
Pre visit review using our clinic review tool, if applicable. No additional management support is needed unless otherwise documented below in the visit note. 

## 2015-06-05 ENCOUNTER — Ambulatory Visit
Admission: RE | Admit: 2015-06-05 | Discharge: 2015-06-05 | Disposition: A | Discharge status: Home / Self Care | Payer: BC Managed Care – PPO | Source: Ambulatory Visit

## 2015-06-05 DIAGNOSIS — Z1231 Encounter for screening mammogram for malignant neoplasm of breast: Secondary | ICD-10-CM

## 2015-06-12 ENCOUNTER — Ambulatory Visit
Admission: RE | Admit: 2015-06-12 | Discharge: 2015-06-12 | Disposition: A | Discharge status: Home / Self Care | Payer: BC Managed Care – PPO | Source: Ambulatory Visit | Attending: Family Medicine | Admitting: Family Medicine

## 2015-06-12 DIAGNOSIS — M19012 Primary osteoarthritis, left shoulder: Secondary | ICD-10-CM | POA: Diagnosis not present

## 2015-06-12 DIAGNOSIS — M67912 Unspecified disorder of synovium and tendon, left shoulder: Secondary | ICD-10-CM

## 2015-06-12 DIAGNOSIS — M25412 Effusion, left shoulder: Secondary | ICD-10-CM | POA: Diagnosis not present

## 2015-06-12 DIAGNOSIS — M7542 Impingement syndrome of left shoulder: Secondary | ICD-10-CM

## 2015-06-12 DIAGNOSIS — M75122 Complete rotator cuff tear or rupture of left shoulder, not specified as traumatic: Secondary | ICD-10-CM | POA: Insufficient documentation

## 2015-06-12 DIAGNOSIS — M25512 Pain in left shoulder: Secondary | ICD-10-CM | POA: Insufficient documentation

## 2015-10-04 ENCOUNTER — Ambulatory Visit (INDEPENDENT_AMBULATORY_CARE_PROVIDER_SITE_OTHER): Payer: BC Managed Care – PPO | Admitting: Podiatry

## 2015-10-04 ENCOUNTER — Encounter: Payer: Self-pay | Admitting: Podiatry

## 2015-10-04 VITALS — BP 141/72 | HR 68 | Resp 18

## 2015-10-04 DIAGNOSIS — M722 Plantar fascial fibromatosis: Secondary | ICD-10-CM

## 2015-10-04 NOTE — Progress Notes (Signed)
She presents today after having not seen her for over a year with a chief complaint of reoccurrence of a nodule to the plantar medial aspect of her left foot. She states that is not very large is coming more painful with radiating pains to the distal toe and proximally along the longitudinal arch. She also notices some numbness to the central toes as well. She denies any trauma.  Objective: Vital signs are stable alert and oriented 3. I have reviewed her past medical history medications allergies surgeries and social history. Pulses are strongly palpable. Neurologic sensorium is intact orthopedic evaluation does demonstrate a 1.5 x 4 cm long lesion nonpulsatile in nature but firm along the medial longitudinal arch just proximal to the sesamoidal apparatus. This appears to be a plantar fibroma as before diagnosis.  Assessment: Plantar fibromatosis left foot.  Plan: Reinjected today with Kenalog and local anesthetic if this does not decrease her symptoms then we will consider surgical intervention.

## 2015-11-20 ENCOUNTER — Ambulatory Visit (INDEPENDENT_AMBULATORY_CARE_PROVIDER_SITE_OTHER): Payer: BC Managed Care – PPO | Admitting: Podiatry

## 2015-11-20 ENCOUNTER — Encounter: Payer: Self-pay | Admitting: Podiatry

## 2015-11-20 VITALS — BP 132/74 | HR 68 | Resp 12

## 2015-11-20 DIAGNOSIS — M722 Plantar fascial fibromatosis: Secondary | ICD-10-CM | POA: Diagnosis not present

## 2015-11-20 NOTE — Progress Notes (Signed)
She presents today for follow-up of her fibromatosis. She states that is doing much better and it has almost completely resolved. She does state however she is having numbness to the plantar aspect of the first metatarsophalangeal joint as well as the dorsal aspect of the first metatarsophalangeal joint and between the toes #1 and #2 left foot. She does relate a history of back pain.  Objective: Vital signs are stable she is alert and oriented 3. Pulses are palpable. Plantar fibromatosis have nearly resolved 100%. She has loss of sensation plantar aspect of the medial foot and dorsal aspect medial foot.  Assessment: Resolving plantar fibromatosis. Neuritis possibly associated with radiculopathy.  Plan Tibial and common peroneal portion of the sciatic appear to be involved medial aspect of the foot. I did recommend she follow-up with her neurologist. She will follow up with me should her fibromas recur.

## 2015-11-21 ENCOUNTER — Telehealth: Payer: Self-pay | Admitting: Family Medicine

## 2015-11-21 DIAGNOSIS — Z0000 Encounter for general adult medical examination without abnormal findings: Secondary | ICD-10-CM

## 2015-11-21 NOTE — Telephone Encounter (Signed)
-----   Message from Ellamae Sia sent at 11/21/2015 11:15 AM EDT ----- Regarding: lab orders for Wednesdday, 7.19.17 Patient is scheduled for CPX labs, please order future labs, Thanks , Karna Christmas

## 2015-11-22 ENCOUNTER — Other Ambulatory Visit (INDEPENDENT_AMBULATORY_CARE_PROVIDER_SITE_OTHER): Payer: BC Managed Care – PPO

## 2015-11-22 DIAGNOSIS — Z0000 Encounter for general adult medical examination without abnormal findings: Secondary | ICD-10-CM

## 2015-11-22 LAB — CBC WITH DIFFERENTIAL/PLATELET
Basophils Absolute: 0 10*3/uL (ref 0.0–0.1)
Basophils Relative: 0.3 % (ref 0.0–3.0)
EOS PCT: 3.1 % (ref 0.0–5.0)
Eosinophils Absolute: 0.2 10*3/uL (ref 0.0–0.7)
HEMATOCRIT: 36.5 % (ref 36.0–46.0)
Hemoglobin: 12.1 g/dL (ref 12.0–15.0)
LYMPHS ABS: 2.1 10*3/uL (ref 0.7–4.0)
LYMPHS PCT: 27.8 % (ref 12.0–46.0)
MCHC: 33.2 g/dL (ref 30.0–36.0)
MCV: 90.9 fl (ref 78.0–100.0)
MONOS PCT: 12.4 % — AB (ref 3.0–12.0)
Monocytes Absolute: 0.9 10*3/uL (ref 0.1–1.0)
NEUTROS PCT: 56.4 % (ref 43.0–77.0)
Neutro Abs: 4.3 10*3/uL (ref 1.4–7.7)
Platelets: 293 10*3/uL (ref 150.0–400.0)
RBC: 4.01 Mil/uL (ref 3.87–5.11)
RDW: 13.7 % (ref 11.5–15.5)
WBC: 7.6 10*3/uL (ref 4.0–10.5)

## 2015-11-22 LAB — COMPREHENSIVE METABOLIC PANEL
ALBUMIN: 4.2 g/dL (ref 3.5–5.2)
ALK PHOS: 74 U/L (ref 39–117)
ALT: 11 U/L (ref 0–35)
AST: 18 U/L (ref 0–37)
BILIRUBIN TOTAL: 0.5 mg/dL (ref 0.2–1.2)
BUN: 20 mg/dL (ref 6–23)
CALCIUM: 9.8 mg/dL (ref 8.4–10.5)
CO2: 28 mEq/L (ref 19–32)
Chloride: 106 mEq/L (ref 96–112)
Creatinine, Ser: 0.71 mg/dL (ref 0.40–1.20)
GFR: 88.63 mL/min (ref >60.00)
GLUCOSE: 93 mg/dL (ref 70–99)
POTASSIUM: 4.1 meq/L (ref 3.5–5.1)
Sodium: 141 mEq/L (ref 135–145)
TOTAL PROTEIN: 7.5 g/dL (ref 6.0–8.3)

## 2015-11-22 LAB — LIPID PANEL
CHOLESTEROL: 198 mg/dL (ref 0–200)
HDL: 65.9 mg/dL (ref >39.00)
LDL Cholesterol: 121 mg/dL — ABNORMAL HIGH (ref 0–99)
NonHDL: 131.9
TRIGLYCERIDES: 55 mg/dL (ref 0.0–149.0)
Total CHOL/HDL Ratio: 3
VLDL: 11 mg/dL (ref 0.0–40.0)

## 2015-11-22 LAB — TSH: TSH: 2.45 u[IU]/mL (ref 0.35–4.50)

## 2015-11-27 ENCOUNTER — Encounter: Payer: Self-pay | Admitting: Family Medicine

## 2015-11-27 ENCOUNTER — Ambulatory Visit (INDEPENDENT_AMBULATORY_CARE_PROVIDER_SITE_OTHER): Payer: BC Managed Care – PPO | Admitting: Family Medicine

## 2015-11-27 VITALS — BP 122/62 | HR 63 | Temp 98.2°F | Ht 63.5 in | Wt 148.0 lb

## 2015-11-27 DIAGNOSIS — H6983 Other specified disorders of Eustachian tube, bilateral: Secondary | ICD-10-CM

## 2015-11-27 DIAGNOSIS — Z0001 Encounter for general adult medical examination with abnormal findings: Secondary | ICD-10-CM | POA: Diagnosis not present

## 2015-11-27 DIAGNOSIS — Z0000 Encounter for general adult medical examination without abnormal findings: Secondary | ICD-10-CM

## 2015-11-27 DIAGNOSIS — H6990 Unspecified Eustachian tube disorder, unspecified ear: Secondary | ICD-10-CM | POA: Insufficient documentation

## 2015-11-27 DIAGNOSIS — H6980 Other specified disorders of Eustachian tube, unspecified ear: Secondary | ICD-10-CM | POA: Insufficient documentation

## 2015-11-27 MED ORDER — MELOXICAM 15 MG PO TABS: 15.0000 mg | ORAL_TABLET | Freq: Every day | ORAL | 3 refills | Status: DC

## 2015-11-27 MED ORDER — FLUTICASONE PROPIONATE 50 MCG/ACT NA SUSP: NASAL | 3 refills | Status: DC

## 2015-11-27 NOTE — Progress Notes (Signed)
Pre visit review using our clinic review tool, if applicable. No additional management support is needed unless otherwise documented below in the visit note. 

## 2015-11-27 NOTE — Patient Instructions (Signed)
I'm glad you are doing well  Increase your flonase to twice daily for 2 weeks for ears  If no improvement - let us know and I will refer you to an ear specialist If worse of pain - or fever - call and let me know  Labs are stable  Keep working on healthy habits

## 2015-11-27 NOTE — Assessment & Plan Note (Signed)
Bilateral but appears worse on R with effusion Increase flonase to bid for 2 weeks If no improvement- will ref to ENT or if ear pain update [place]

## 2015-11-27 NOTE — Progress Notes (Signed)
Subjective:    Patient ID: Megan Harrison, female    DOB: Jan 20, 1956, 60 y.o.   MRN: [id number]  HPI Here for health maintenance exam and to review chronic medical problems    Feeling ok overall   Had a nasty cold in June - ears feel stopped up   Had rotator cuff surgery and it went well   Pap 7/14-nl / GYN Dr Harrington Challenger retired  Has a complete hysterectomy for endometriosis (no cancer)  Pap tests have been normal  No new partner or symptoms   Hep C /HIV screening-not high risk , declines   Flu shot last oct  Mammogram 1/17-negative  Self breast exam - no lumps  Colonoscopy 4/15- polypoid mucosa/ repeat in 5 years No stool changes   Tetanus show 7/16  Zoster vaccine 7/15  Wt Readings from Last 3 Encounters:  11/27/15 148 lb (67.1 kg)  05/24/15 155 lb (70.3 kg)  01/16/15 155 lb 12 oz (70.6 kg)   bmi is 25 Lost some wt - down 7 lb  Got back with the program with wt watchers    Results for orders placed or performed in visit on 11/22/15  CBC with Differential/Platelet  Result Value Ref Range   WBC 7.6 4.0 - 10.5 K/uL   RBC 4.01 3.87 - 5.11 Mil/uL   Hemoglobin 12.1 12.0 - 15.0 g/dL   HCT 36.5 36.0 - 46.0 %   MCV 90.9 78.0 - 100.0 fl   MCHC 33.2 30.0 - 36.0 g/dL   RDW 13.7 11.5 - 15.5 %   Platelets 293.0 150.0 - 400.0 K/uL   Neutrophils Relative % 56.4 43.0 - 77.0 %   Lymphocytes Relative 27.8 12.0 - 46.0 %   Monocytes Relative 12.4 (H) 3.0 - 12.0 %   Eosinophils Relative 3.1 0.0 - 5.0 %   Basophils Relative 0.3 0.0 - 3.0 %   Neutro Abs 4.3 1.4 - 7.7 K/uL   Lymphs Abs 2.1 0.7 - 4.0 K/uL   Monocytes Absolute 0.9 0.1 - 1.0 K/uL   Eosinophils Absolute 0.2 0.0 - 0.7 K/uL   Basophils Absolute 0.0 0.0 - 0.1 K/uL  Comprehensive metabolic panel  Result Value Ref Range   Sodium 141 135 - 145 mEq/L   Potassium 4.1 3.5 - 5.1 mEq/L   Chloride 106 96 - 112 mEq/L   CO2 28 19 - 32 mEq/L   Glucose, Bld 93 70 - 99 mg/dL   BUN 20 6 - 23 mg/dL   Creatinine, Ser 0.71 0.40  - 1.20 mg/dL   Total Bilirubin 0.5 0.2 - 1.2 mg/dL   Alkaline Phosphatase 74 39 - 117 U/L   AST 18 0 - 37 U/L   ALT 11 0 - 35 U/L   Total Protein 7.5 6.0 - 8.3 g/dL   Albumin 4.2 3.5 - 5.2 g/dL   Calcium 9.8 8.4 - 10.5 mg/dL   GFR 88.63 >60.00 mL/min  Lipid panel  Result Value Ref Range   Cholesterol 198 0 - 200 mg/dL   Triglycerides 55.0 0.0 - 149.0 mg/dL   HDL 65.90 >39.00 mg/dL   VLDL 11.0 0.0 - 40.0 mg/dL   LDL Cholesterol 121 (H) 0 - 99 mg/dL   Total CHOL/HDL Ratio 3    NonHDL 131.90   TSH  Result Value Ref Range   TSH 2.45 0.35 - 4.50 uIU/mL    No falls or broken bones  Takes ca and vit D  Patient Active Problem List   Diagnosis Date Noted  .  ETD (eustachian tube dysfunction) 11/27/2015  . Encounter for routine gynecological examination 11/04/2014  . Spinal stenosis of lumbar region 04/02/2013  . Hemorrhoids, internal 04/02/2013  . Low back pain potentially associated with radiculopathy 03/29/2013  . Routine general medical examination at a health care facility 06/09/2011  . ALLERGIC RHINITIS 03/10/2009  . HERPES ZOSTER OPHTHALMICUS 08/28/2006  . MIGRAINES, HX OF 08/28/2006   Past Medical History:  Diagnosis Date  . Anemia   . Cutaneous sarcoidosis ([place])   . Degenerative disc disease   . Foot pain   . Migraine   . Zoster    Opthy (regular checks)   Past Surgical History:  Procedure Laterality Date  . 2D Echo  1999   Negative  . ABDOMINAL HYSTERECTOMY  11/99  . Arm lesion, cutaneous sarcoid  12/2005   with normal CXR and ANA  . CESAREAN SECTION     two times  . COLONOSCOPY  11/0/2009   diverticulosis and hemorrhoids  . TONSILLECTOMY    . UPPER GASTROINTESTINAL ENDOSCOPY  11/15/2010   non-erosive gastritis, H. pylori negative   Social History  Substance Use Topics  . Smoking status: Never Smoker  . Smokeless tobacco: Never Used  . Alcohol use No   Family History  Problem Relation Age of Onset  . Colon cancer Brother   . Heart disease Mother       CAD  . Alcohol abuse Mother   . Lung cancer Father   . Heart disease Brother     CAD  . Heart disease Brother     CAD, pre diabetic, obese  . Cancer Brother     Colon CA, liver mets at 78, DM   Allergies  Allergen Reactions  . Codeine     REACTION: syncope  . Sumatriptan     REACTION: choking sensation   Current Outpatient Prescriptions on File Prior to Visit  Medication Sig Dispense Refill  . aspirin 81 MG tablet Take 81 mg by mouth daily.     . Brinzolamide-Brimonidine (SIMBRINZA) 1-0.2 % SUSP Place 1 drop into the left eye daily.    . Calcium Carbonate (CALCIUM 500 PO) Take 1 capsule by mouth daily.      Marland Kitchen docusate sodium (COLACE) 100 MG capsule Take 100 mg by mouth daily.    . ferrous sulfate 325 (65 FE) MG tablet Take 325 mg by mouth daily.    . Multiple Vitamins-Minerals (ONE-A-DAY WOMENS 50 PLUS PO) Take 1 tablet by mouth daily.      . prednisoLONE acetate (PRED FORTE) 1 % ophthalmic suspension Place 1 drop into the left eye 3 (three) times daily.      No current facility-administered medications on file prior to visit.     Review of Systems Review of Systems  Constitutional: Negative for fever, appetite change, fatigue and unexpected weight change.  ENTpos for ear fullness w/o pain or ear drainage  Eyes: Negative for pain and visual disturbance.  Respiratory: Negative for cough and shortness of breath.   Cardiovascular: Negative for cp or palpitations    Gastrointestinal: Negative for nausea, diarrhea and constipation.  Genitourinary: Negative for urgency and frequency.  Skin: Negative for pallor or rash   Neurological: Negative for weakness, light-headedness, numbness and headaches.  MSK pos for chronic back pain/ resolving shoulder pain after surgery and foot pain from hx of fibromatosis of her soles (getting steroid shots) Hematological: Negative for adenopathy. Does not bruise/bleed easily.  Psychiatric/Behavioral: Negative for dysphoric mood. The patient is  not nervous/anxious.  Objective:   Physical Exam  Constitutional: She appears well-developed and well-nourished. No distress.  Well appearing   HENT:  Head: Normocephalic and atraumatic.  Right Ear: External ear normal.  Left Ear: External ear normal.  Mouth/Throat: Oropharynx is clear and moist.  Effusions bilat TMs- worse on the R (affecting hearing)  Nares clear No sinus tenderness   Eyes: Conjunctivae and EOM are normal. Pupils are equal, round, and reactive to light. No scleral icterus.  Neck: Normal range of motion. Neck supple. No JVD present. Carotid bruit is not present. No thyromegaly present.  Cardiovascular: Normal rate, regular rhythm, normal heart sounds and intact distal pulses.  Exam reveals no gallop.   Pulmonary/Chest: Effort normal and breath sounds normal. No respiratory distress. She has no wheezes. She exhibits no tenderness.  Abdominal: Soft. Bowel sounds are normal. She exhibits no distension, no abdominal bruit and no mass. There is no tenderness.  Genitourinary: No breast swelling, tenderness, discharge or bleeding.  Genitourinary Comments: Breast exam: No mass, nodules, thickening, tenderness, bulging, retraction, inflamation, nipple discharge or skin changes noted.  No axillary or clavicular LA.      Musculoskeletal: Normal range of motion. She exhibits no edema or tenderness.  Limited rom of shoulders   Lymphadenopathy:    She has no cervical adenopathy.  Neurological: She is alert. She has normal reflexes. No cranial nerve deficit. She exhibits normal muscle tone. Coordination normal.  Skin: Skin is warm and dry. No rash noted. No erythema. No pallor.  Fair with a few lentigines    Psychiatric: She has a normal mood and affect.          Assessment & Plan:   Problem List Items Addressed This Visit      Nervous and Auditory   ETD (eustachian tube dysfunction)    Bilateral but appears worse on R with effusion Increase flonase to bid for 2  weeks If no improvement- will ref to ENT or if ear pain update [place]         Other   Routine general medical examination at a health care facility - Primary    Reviewed health habits including diet and exercise and skin cancer prevention Reviewed appropriate screening tests for age  Also reviewed health mt list, fam hx and immunization status , as well as social and family history   See HPI Commended on wt loss Labs reviewed  Disc low impact exercise        Other Visit Diagnoses   None.

## 2015-11-27 NOTE — Assessment & Plan Note (Signed)
Reviewed health habits including diet and exercise and skin cancer prevention Reviewed appropriate screening tests for age  Also reviewed health mt list, fam hx and immunization status , as well as social and family history   See HPI Commended on wt loss Labs reviewed  Disc low impact exercise

## 2015-12-07 ENCOUNTER — Telehealth: Payer: Self-pay | Admitting: Family Medicine

## 2015-12-07 DIAGNOSIS — H6983 Other specified disorders of Eustachian tube, bilateral: Secondary | ICD-10-CM

## 2015-12-07 NOTE — Telephone Encounter (Signed)
Ref done  

## 2015-12-07 NOTE — Telephone Encounter (Signed)
Pt's ear is not getting any better, she would like a referral for ent in [place]

## 2016-02-18 ENCOUNTER — Observation Stay (HOSPITAL_COMMUNITY)
Admission: EM | Admit: 2016-02-18 | Discharge: 2016-02-19 | Disposition: A | Discharge status: Home / Self Care | Payer: BC Managed Care – PPO | Attending: General Surgery | Admitting: General Surgery

## 2016-02-18 ENCOUNTER — Encounter (HOSPITAL_COMMUNITY): Payer: Self-pay

## 2016-02-18 ENCOUNTER — Emergency Department (HOSPITAL_COMMUNITY): Payer: BC Managed Care – PPO

## 2016-02-18 ENCOUNTER — Emergency Department (HOSPITAL_COMMUNITY): Payer: BC Managed Care – PPO | Admitting: Anesthesiology

## 2016-02-18 ENCOUNTER — Encounter (HOSPITAL_COMMUNITY)
Admission: EM | Disposition: A | Discharge status: Home / Self Care | Payer: Self-pay | Source: Home / Self Care | Attending: Emergency Medicine

## 2016-02-18 DIAGNOSIS — Z8249 Family history of ischemic heart disease and other diseases of the circulatory system: Secondary | ICD-10-CM | POA: Insufficient documentation

## 2016-02-18 DIAGNOSIS — K353 Acute appendicitis with localized peritonitis, without perforation or gangrene: Secondary | ICD-10-CM

## 2016-02-18 DIAGNOSIS — Z90710 Acquired absence of both cervix and uterus: Secondary | ICD-10-CM | POA: Diagnosis not present

## 2016-02-18 DIAGNOSIS — K8020 Calculus of gallbladder without cholecystitis without obstruction: Secondary | ICD-10-CM | POA: Diagnosis not present

## 2016-02-18 DIAGNOSIS — M48061 Spinal stenosis, lumbar region without neurogenic claudication: Secondary | ICD-10-CM | POA: Diagnosis not present

## 2016-02-18 DIAGNOSIS — Z801 Family history of malignant neoplasm of trachea, bronchus and lung: Secondary | ICD-10-CM | POA: Diagnosis not present

## 2016-02-18 DIAGNOSIS — K352 Acute appendicitis with generalized peritonitis: Secondary | ICD-10-CM | POA: Diagnosis not present

## 2016-02-18 DIAGNOSIS — Z885 Allergy status to narcotic agent status: Secondary | ICD-10-CM | POA: Diagnosis not present

## 2016-02-18 DIAGNOSIS — Z800 Family history of malignant neoplasm of digestive organs: Secondary | ICD-10-CM | POA: Insufficient documentation

## 2016-02-18 DIAGNOSIS — K3580 Unspecified acute appendicitis: Secondary | ICD-10-CM | POA: Diagnosis present

## 2016-02-18 HISTORY — PX: LAPAROSCOPIC APPENDECTOMY: SHX408

## 2016-02-18 LAB — CBC
HCT: 38 % (ref 36.0–46.0)
HEMOGLOBIN: 13 g/dL (ref 12.0–15.0)
MCH: 31.2 pg (ref 26.0–34.0)
MCHC: 34.2 g/dL (ref 30.0–36.0)
MCV: 91.1 fL (ref 78.0–100.0)
Platelets: 339 10*3/uL (ref 150–400)
RBC: 4.17 MIL/uL (ref 3.87–5.11)
RDW: 13.4 % (ref 11.5–15.5)
WBC: 18.8 10*3/uL — ABNORMAL HIGH (ref 4.0–10.5)

## 2016-02-18 LAB — URINALYSIS, ROUTINE W REFLEX MICROSCOPIC
BILIRUBIN URINE: NEGATIVE
Glucose, UA: NEGATIVE mg/dL
Hgb urine dipstick: NEGATIVE
KETONES UR: 15 mg/dL — AB
Leukocytes, UA: NEGATIVE
NITRITE: NEGATIVE
PROTEIN: NEGATIVE mg/dL
SPECIFIC GRAVITY, URINE: 1.005 (ref 1.005–1.030)
pH: 7.5 (ref 5.0–8.0)

## 2016-02-18 LAB — COMPREHENSIVE METABOLIC PANEL
ALBUMIN: 4.1 g/dL (ref 3.5–5.0)
ALK PHOS: 75 U/L (ref 38–126)
ALT: 19 U/L (ref 14–54)
ANION GAP: 10 (ref 5–15)
AST: 22 U/L (ref 15–41)
BILIRUBIN TOTAL: 0.6 mg/dL (ref 0.3–1.2)
BUN: 16 mg/dL (ref 6–20)
CALCIUM: 9.6 mg/dL (ref 8.9–10.3)
CO2: 23 mmol/L (ref 22–32)
Chloride: 105 mmol/L (ref 101–111)
Creatinine, Ser: 0.6 mg/dL (ref 0.44–1.00)
GFR calc non Af Amer: >60 mL/min (ref >60)
GLUCOSE: 113 mg/dL — AB (ref 65–99)
Potassium: 3.8 mmol/L (ref 3.5–5.1)
Sodium: 138 mmol/L (ref 135–145)
TOTAL PROTEIN: 7.2 g/dL (ref 6.5–8.1)

## 2016-02-18 LAB — LIPASE, BLOOD: Lipase: 22 U/L (ref 11–51)

## 2016-02-18 SURGERY — APPENDECTOMY, LAPAROSCOPIC
Anesthesia: General | Site: Abdomen

## 2016-02-18 MED ORDER — SUGAMMADEX SODIUM 200 MG/2ML IV SOLN
INTRAVENOUS | Status: DC | PRN
  Administered 2016-02-18: 200 mg via INTRAVENOUS

## 2016-02-18 MED ORDER — LACTATED RINGERS IV SOLN
INTRAVENOUS | Status: DC | PRN
Start: 2016-02-18 — End: 2016-02-18
  Administered 2016-02-18: 15:00:00 via INTRAVENOUS

## 2016-02-18 MED ORDER — BUPIVACAINE HCL 0.25 % IJ SOLN
INTRAMUSCULAR | Status: DC | PRN
  Administered 2016-02-18: 5 mL

## 2016-02-18 MED ORDER — ROCURONIUM BROMIDE 100 MG/10ML IV SOLN
INTRAVENOUS | Status: DC | PRN
  Administered 2016-02-18: 30 mg via INTRAVENOUS

## 2016-02-18 MED ORDER — FENTANYL CITRATE (PF) 100 MCG/2ML IJ SOLN
INTRAMUSCULAR | Status: DC | PRN
Start: 2016-02-18 — End: 2016-02-18
  Administered 2016-02-18 (×2): 50 ug via INTRAVENOUS
  Administered 2016-02-18: 100 ug via INTRAVENOUS

## 2016-02-18 MED ORDER — SUCCINYLCHOLINE CHLORIDE 200 MG/10ML IV SOSY
PREFILLED_SYRINGE | INTRAVENOUS | Status: AC
  Filled 2016-02-18: qty 20

## 2016-02-18 MED ORDER — DEXTROSE-NACL 5-0.9 % IV SOLN
INTRAVENOUS | Status: DC
  Administered 2016-02-18 – 2016-02-19 (×2): via INTRAVENOUS

## 2016-02-18 MED ORDER — HYDROMORPHONE HCL 1 MG/ML IJ SOLN
INTRAMUSCULAR | Status: AC
  Administered 2016-02-18: 0.5 mg via INTRAVENOUS
  Filled 2016-02-18: qty 1

## 2016-02-18 MED ORDER — FENTANYL CITRATE (PF) 100 MCG/2ML IJ SOLN
INTRAMUSCULAR | Status: AC
  Filled 2016-02-18: qty 4

## 2016-02-18 MED ORDER — PROPOFOL 10 MG/ML IV BOLUS
INTRAVENOUS | Status: AC
  Filled 2016-02-18: qty 20

## 2016-02-18 MED ORDER — ROCURONIUM BROMIDE 10 MG/ML (PF) SYRINGE
PREFILLED_SYRINGE | INTRAVENOUS | Status: AC
  Filled 2016-02-18: qty 20

## 2016-02-18 MED ORDER — SUCCINYLCHOLINE CHLORIDE 20 MG/ML IJ SOLN
INTRAMUSCULAR | Status: DC | PRN
  Administered 2016-02-18: 115 mg via INTRAVENOUS

## 2016-02-18 MED ORDER — DEXTROSE 5 % IV SOLN
2.0000 g | INTRAVENOUS | Status: DC
  Filled 2016-02-18: qty 2

## 2016-02-18 MED ORDER — EPHEDRINE 5 MG/ML INJ
INTRAVENOUS | Status: AC
  Filled 2016-02-18: qty 10

## 2016-02-18 MED ORDER — SODIUM CHLORIDE 0.9 % IR SOLN
Status: DC | PRN
Start: 2016-02-18 — End: 2016-02-18
  Administered 2016-02-18: 1000 mL

## 2016-02-18 MED ORDER — KETOROLAC TROMETHAMINE 30 MG/ML IJ SOLN
INTRAMUSCULAR | Status: AC
  Filled 2016-02-18: qty 1

## 2016-02-18 MED ORDER — LIDOCAINE 2% (20 MG/ML) 5 ML SYRINGE
INTRAMUSCULAR | Status: AC
  Filled 2016-02-18: qty 10

## 2016-02-18 MED ORDER — DEXTROSE 5 % IV SOLN
2.0000 g | Freq: Once | INTRAVENOUS | Status: AC
  Administered 2016-02-18: 2 g via INTRAVENOUS
  Filled 2016-02-18: qty 2

## 2016-02-18 MED ORDER — HYDROMORPHONE HCL 1 MG/ML IJ SOLN: 1.0000 mg | INTRAMUSCULAR | Status: DC | PRN

## 2016-02-18 MED ORDER — ARTIFICIAL TEARS OP OINT
TOPICAL_OINTMENT | OPHTHALMIC | Status: DC | PRN
  Administered 2016-02-18: 1 via OPHTHALMIC

## 2016-02-18 MED ORDER — ONDANSETRON 4 MG PO TBDP: 4.0000 mg | ORAL_TABLET | Freq: Four times a day (QID) | ORAL | Status: DC | PRN

## 2016-02-18 MED ORDER — LIDOCAINE HCL (CARDIAC) 20 MG/ML IV SOLN
INTRAVENOUS | Status: DC | PRN
  Administered 2016-02-18: 80 mg via INTRAVENOUS

## 2016-02-18 MED ORDER — ONDANSETRON HCL 4 MG/2ML IJ SOLN: 4.0000 mg | Freq: Four times a day (QID) | INTRAMUSCULAR | Status: DC | PRN

## 2016-02-18 MED ORDER — SODIUM CHLORIDE 0.9 % IV BOLUS (SEPSIS)
1000.0000 mL | Freq: Once | INTRAVENOUS | Status: AC
  Administered 2016-02-18: 1000 mL via INTRAVENOUS

## 2016-02-18 MED ORDER — MEPERIDINE HCL 25 MG/ML IJ SOLN: 6.2500 mg | INTRAMUSCULAR | Status: DC | PRN

## 2016-02-18 MED ORDER — SUGAMMADEX SODIUM 200 MG/2ML IV SOLN
INTRAVENOUS | Status: AC
  Filled 2016-02-18: qty 2

## 2016-02-18 MED ORDER — SODIUM CHLORIDE 0.9 % IV SOLN
INTRAVENOUS | Status: DC | PRN
  Administered 2016-02-18: 14:00:00 via INTRAVENOUS

## 2016-02-18 MED ORDER — MIDAZOLAM HCL 2 MG/2ML IJ SOLN
INTRAMUSCULAR | Status: AC
  Filled 2016-02-18: qty 2

## 2016-02-18 MED ORDER — SUGAMMADEX SODIUM 200 MG/2ML IV SOLN
INTRAVENOUS | Status: AC
  Filled 2016-02-18: qty 4

## 2016-02-18 MED ORDER — ARTIFICIAL TEARS OP OINT
TOPICAL_OINTMENT | OPHTHALMIC | Status: AC
  Filled 2016-02-18: qty 3.5

## 2016-02-18 MED ORDER — METRONIDAZOLE IN NACL 5-0.79 MG/ML-% IV SOLN
500.0000 mg | Freq: Three times a day (TID) | INTRAVENOUS | Status: DC
  Administered 2016-02-18 – 2016-02-19 (×2): 500 mg via INTRAVENOUS
  Filled 2016-02-18 (×3): qty 100

## 2016-02-18 MED ORDER — EPHEDRINE SULFATE 50 MG/ML IJ SOLN
INTRAMUSCULAR | Status: DC | PRN
  Administered 2016-02-18 (×3): 5 mg via INTRAVENOUS

## 2016-02-18 MED ORDER — ONDANSETRON HCL 4 MG/2ML IJ SOLN: 4.0000 mg | Freq: Once | INTRAMUSCULAR | Status: DC | PRN

## 2016-02-18 MED ORDER — ONDANSETRON HCL 4 MG/2ML IJ SOLN
INTRAMUSCULAR | Status: AC
  Filled 2016-02-18: qty 2

## 2016-02-18 MED ORDER — SUCCINYLCHOLINE CHLORIDE 200 MG/10ML IV SOSY
PREFILLED_SYRINGE | INTRAVENOUS | Status: AC
  Filled 2016-02-18: qty 10

## 2016-02-18 MED ORDER — ROCURONIUM BROMIDE 10 MG/ML (PF) SYRINGE
PREFILLED_SYRINGE | INTRAVENOUS | Status: AC
  Filled 2016-02-18: qty 10

## 2016-02-18 MED ORDER — MIDAZOLAM HCL 5 MG/5ML IJ SOLN
INTRAMUSCULAR | Status: DC | PRN
  Administered 2016-02-18: 2 mg via INTRAVENOUS

## 2016-02-18 MED ORDER — FENTANYL CITRATE (PF) 100 MCG/2ML IJ SOLN
25.0000 ug | Freq: Once | INTRAMUSCULAR | Status: AC
  Administered 2016-02-18: 25 ug via INTRAVENOUS
  Filled 2016-02-18: qty 2

## 2016-02-18 MED ORDER — BUPIVACAINE HCL (PF) 0.25 % IJ SOLN
INTRAMUSCULAR | Status: AC
  Filled 2016-02-18: qty 30

## 2016-02-18 MED ORDER — PHENYLEPHRINE HCL 10 MG/ML IJ SOLN
INTRAMUSCULAR | Status: DC | PRN
  Administered 2016-02-18: 40 ug via INTRAVENOUS

## 2016-02-18 MED ORDER — HYDROMORPHONE HCL 1 MG/ML IJ SOLN
0.2500 mg | INTRAMUSCULAR | Status: DC | PRN
  Administered 2016-02-18 (×2): 0.5 mg via INTRAVENOUS

## 2016-02-18 MED ORDER — 0.9 % SODIUM CHLORIDE (POUR BTL) OPTIME
TOPICAL | Status: DC | PRN
  Administered 2016-02-18: 1000 mL

## 2016-02-18 MED ORDER — OXYCODONE HCL 5 MG PO TABS: 5.0000 mg | ORAL_TABLET | Freq: Once | ORAL | Status: DC | PRN

## 2016-02-18 MED ORDER — METRONIDAZOLE IN NACL 5-0.79 MG/ML-% IV SOLN
500.0000 mg | Freq: Once | INTRAVENOUS | Status: AC
  Administered 2016-02-18: 500 mg via INTRAVENOUS
  Filled 2016-02-18: qty 100

## 2016-02-18 MED ORDER — PROPOFOL 10 MG/ML IV BOLUS
INTRAVENOUS | Status: DC | PRN
  Administered 2016-02-18: 200 mg via INTRAVENOUS

## 2016-02-18 MED ORDER — ONDANSETRON HCL 4 MG/2ML IJ SOLN
4.0000 mg | Freq: Once | INTRAMUSCULAR | Status: AC
  Administered 2016-02-18: 4 mg via INTRAVENOUS
  Filled 2016-02-18: qty 2

## 2016-02-18 MED ORDER — OXYCODONE HCL 5 MG PO TABS: 5.0000 mg | ORAL_TABLET | ORAL | Status: DC | PRN

## 2016-02-18 MED ORDER — LIDOCAINE 2% (20 MG/ML) 5 ML SYRINGE
INTRAMUSCULAR | Status: AC
  Filled 2016-02-18: qty 5

## 2016-02-18 MED ORDER — IOPAMIDOL (ISOVUE-300) INJECTION 61%
INTRAVENOUS | Status: AC
  Administered 2016-02-18: 100 mL
  Filled 2016-02-18: qty 100

## 2016-02-18 MED ORDER — OXYCODONE HCL 5 MG/5ML PO SOLN: 5.0000 mg | Freq: Once | ORAL | Status: DC | PRN

## 2016-02-18 MED ORDER — DEXAMETHASONE SODIUM PHOSPHATE 10 MG/ML IJ SOLN
INTRAMUSCULAR | Status: AC
  Filled 2016-02-18: qty 1

## 2016-02-18 MED ORDER — ONDANSETRON HCL 4 MG/2ML IJ SOLN
INTRAMUSCULAR | Status: DC | PRN
  Administered 2016-02-18: 4 mg via INTRAVENOUS

## 2016-02-18 MED ORDER — IOPAMIDOL (ISOVUE-300) INJECTION 61%
INTRAVENOUS | Status: AC
  Filled 2016-02-18: qty 30

## 2016-02-18 MED ORDER — FENTANYL CITRATE (PF) 100 MCG/2ML IJ SOLN
50.0000 ug | Freq: Once | INTRAMUSCULAR | Status: AC
  Administered 2016-02-18: 50 ug via INTRAVENOUS
  Filled 2016-02-18: qty 2

## 2016-02-18 MED ORDER — HYDROMORPHONE HCL 1 MG/ML IJ SOLN
INTRAMUSCULAR | Status: AC
  Filled 2016-02-18: qty 1

## 2016-02-18 SURGICAL SUPPLY — 46 items
ADH SKN CLS APL DERMABOND .7 (GAUZE/BANDAGES/DRESSINGS) ×1
APPLIER CLIP 5 13 M/L LIGAMAX5 (MISCELLANEOUS) ×3
APR CLP MED LRG 5 ANG JAW (MISCELLANEOUS) ×1
BAG SPEC RTRVL 10 TROC 200 (ENDOMECHANICALS) ×1
BLADE SURG ROTATE 9660 (MISCELLANEOUS) IMPLANT
CANISTER SUCTION 2500CC (MISCELLANEOUS) ×3 IMPLANT
CHLORAPREP W/TINT 26ML (MISCELLANEOUS) ×3 IMPLANT
CLIP APPLIE 5 13 M/L LIGAMAX5 (MISCELLANEOUS) IMPLANT
COVER SURGICAL LIGHT HANDLE (MISCELLANEOUS) ×3 IMPLANT
DERMABOND ADVANCED (GAUZE/BANDAGES/DRESSINGS) ×2
DERMABOND ADVANCED .7 DNX12 (GAUZE/BANDAGES/DRESSINGS) ×1 IMPLANT
DEVICE TROCAR PUNCTURE CLOSURE (ENDOMECHANICALS) ×3 IMPLANT
ELECT REM PT RETURN 9FT ADLT (ELECTROSURGICAL) ×3
ELECTRODE REM PT RTRN 9FT ADLT (ELECTROSURGICAL) ×1 IMPLANT
ENDOLOOP SUT PDS II  0 18 (SUTURE) ×6
ENDOLOOP SUT PDS II 0 18 (SUTURE) IMPLANT
GLOVE BIO SURGEON STRL SZ 6 (GLOVE) ×1 IMPLANT
GLOVE BIOGEL PI IND STRL 6.5 (GLOVE) ×1 IMPLANT
GLOVE BIOGEL PI INDICATOR 6.5 (GLOVE)
GOWN STRL REUS W/ TWL LRG LVL3 (GOWN DISPOSABLE) ×3 IMPLANT
GOWN STRL REUS W/TWL LRG LVL3 (GOWN DISPOSABLE) ×9
KIT BASIN OR (CUSTOM PROCEDURE TRAY) ×3 IMPLANT
KIT ROOM TURNOVER OR (KITS) ×3 IMPLANT
NDL INSUFFLATION 14GA 120MM (NEEDLE) ×1 IMPLANT
NEEDLE INSUFFLATION 14GA 120MM (NEEDLE) ×3 IMPLANT
NS IRRIG 1000ML POUR BTL (IV SOLUTION) ×3 IMPLANT
PAD ARMBOARD 7.5X6 YLW CONV (MISCELLANEOUS) ×6 IMPLANT
POUCH RETRIEVAL ECOSAC 10 (ENDOMECHANICALS) IMPLANT
POUCH RETRIEVAL ECOSAC 10MM (ENDOMECHANICALS) ×2
RELOAD 45 VASCULAR/THIN (ENDOMECHANICALS) IMPLANT
RELOAD STAPLE 45 2.5 WHT GRN (ENDOMECHANICALS) IMPLANT
RELOAD STAPLE 45 3.5 BLU ETS (ENDOMECHANICALS) IMPLANT
RELOAD STAPLE TA45 3.5 REG BLU (ENDOMECHANICALS) IMPLANT
SCISSORS LAP 5X35 DISP (ENDOMECHANICALS) ×2 IMPLANT
SET IRRIG TUBING LAPAROSCOPIC (IRRIGATION / IRRIGATOR) ×3 IMPLANT
SLEEVE ENDOPATH XCEL 5M (ENDOMECHANICALS) ×3 IMPLANT
SPECIMEN JAR SMALL (MISCELLANEOUS) ×3 IMPLANT
SUT MNCRL AB 4-0 PS2 18 (SUTURE) ×3 IMPLANT
TOWEL OR 17X24 6PK STRL BLUE (TOWEL DISPOSABLE) ×3 IMPLANT
TOWEL OR 17X26 10 PK STRL BLUE (TOWEL DISPOSABLE) ×3 IMPLANT
TRAY FOLEY CATH 16FR SILVER (SET/KITS/TRAYS/PACK) ×3 IMPLANT
TRAY LAPAROSCOPIC MC (CUSTOM PROCEDURE TRAY) ×3 IMPLANT
TROCAR XCEL 12X100 BLDLESS (ENDOMECHANICALS) ×3 IMPLANT
TROCAR XCEL BLUNT TIP 100MML (ENDOMECHANICALS) IMPLANT
TROCAR XCEL NON-BLD 5MMX100MML (ENDOMECHANICALS) ×3 IMPLANT
TUBING INSUFFLATION (TUBING) ×3 IMPLANT

## 2016-02-18 NOTE — Anesthesia Procedure Notes (Signed)
Procedure Name: Intubation Date/Time: 02/18/2016 2:34 PM Performed by: Suzy Bouchard Pre-anesthesia Checklist: Patient identified, Emergency Drugs available, Suction available, Patient being monitored and Timeout performed Patient Re-evaluated:Patient Re-evaluated prior to inductionOxygen Delivery Method: Circle system utilized Preoxygenation: Pre-oxygenation with 100% oxygen Intubation Type: IV induction, Rapid sequence and Cricoid Pressure applied Laryngoscope Size: Miller and 2 Grade View: Grade I Tube type: Oral Tube size: 7.0 mm Number of attempts: 1 Airway Equipment and Method: Stylet Placement Confirmation: ETT inserted through vocal cords under direct vision,  CO2 detector and breath sounds checked- equal and bilateral Secured at: 21 cm Tube secured with: Tape Dental Injury: Teeth and Oropharynx as per pre-operative assessment

## 2016-02-18 NOTE — ED Notes (Signed)
Pt to CT

## 2016-02-18 NOTE — Op Note (Signed)
02/18/2016  4:04 PM  PATIENT:  Megan Harrison  60 y.o. female  PRE-OPERATIVE DIAGNOSIS:  appendicitis  POST-OPERATIVE DIAGNOSIS:  Acute non perforated appendicitis  PROCEDURE:  Procedure(s): APPENDECTOMY LAPAROSCOPIC (N/A)  SURGEON:  Surgeon(s) and Role:    * Ralene Ok, MD - Primary  ANESTHESIA:   local and general  EBL:  Total I/O In: 2200 [I.V.:1100; IV Piggyback:1100] Out: 350 [Urine:300; Blood:50]  BLOOD ADMINISTERED:none  DRAINS: none   LOCAL MEDICATIONS USED:  BUPIVICAINE   SPECIMEN:  Source of Specimen:  appendix  DISPOSITION OF SPECIMEN:  PATHOLOGY  COUNTS:  YES  TOURNIQUET:  * No tourniquets in log *  DICTATION: .Dragon Dictation Complications: none  Counts: reported as correct x 2  Findings:  The patient had a acutely inflammed non perforated appendix  Specimen: Appendix  Indications for procedure:  The patient is a 60 year old female with a history of periumbilical pain localized in the right lower quadrant patient had a CT scan which revealed signs consistent with acute appendicitis the patient back in for laparoscopic appendectomy.  Details of the procedure:The patient was taken back to the operating room. The patient was placed in supine position with bilateral SCDs in place.  A foley catheter was place. The patient was prepped and draped in the usual sterile fashion.  After appropriate anitbiotics were confirmed, a time-out was confirmed and all facts were verified.    A pneumoperitoneum of 14 mmHg was obtained via a Veress needle technique in the left lower quadrant quadrant.  A 5 mm trocar and 5 mm camera then placed intra-abdominally there is no injury to any intra-abdominal organs a 10 mm infraumbilical port was placed and direct visualization as was a 5 mm port in the right upper quadrant area.   The appendix was identified and seen to be non perforated, but lying retro cecal.  A 79mm port was repositioned to the right lower quadrant.  The  base of the appendix was transected.  2  Endoloops were placed proximallyx2 and one distally.  The appendix was then dissected away from the retroperitoneal fat as it tracked cephalad. This was done with cautery A retrieval bag was then placed into the abdomen and the specimen placed in the bag. The appendiceal stump was cauterized. We evacuate the fluid from the pelvis until the effluent was clear.  The appendix and retrieval  bag was then retrieved via the supraumbilical port. #1 Vicryl was used to reapproximate the fascia at the umbilical port site x1. The skin was reapproximated all port sites 3-0 Monocryl subcuticular fashion. The skin was dressed with steri-strips, guaze, and tape.  The patient had the foley removed. The patient was awakened from general anesthesia was taken to recovery room in stable condition.      PLAN OF CARE: Admit for overnight observation  PATIENT DISPOSITION:  PACU - hemodynamically stable.   Delay start of Pharmacological VTE agent (>24hrs) due to surgical blood loss or risk of bleeding: not applicable

## 2016-02-18 NOTE — H&P (Signed)
Megan Harrison is an 60 y.o. female.   Chief Complaint: abdominal pain HPI: 60 y/o F with 1d h/o abd pain that started centrally and moved to RLQ.  Pt has con't and she presented to ED for further eval.  States no fevers, chills, but had some nausea.    CT in ED show dilated appendix c/w acute appendicitis  Past Medical History:  Diagnosis Date  . Anemia   . Cutaneous sarcoidosis ([place])   . Degenerative disc disease   . Foot pain   . Migraine   . Zoster    Opthy (regular checks)    Past Surgical History:  Procedure Laterality Date  . 2D Echo  1999   Negative  . ABDOMINAL HYSTERECTOMY  11/99  . Arm lesion, cutaneous sarcoid  12/2005   with normal CXR and ANA  . CESAREAN SECTION     two times  . COLONOSCOPY  11/0/2009   diverticulosis and hemorrhoids  . ROTATOR CUFF REPAIR    . TONSILLECTOMY    . UPPER GASTROINTESTINAL ENDOSCOPY  11/15/2010   non-erosive gastritis, H. pylori negative    Family History  Problem Relation Age of Onset  . Colon cancer Brother   . Heart disease Mother     CAD  . Alcohol abuse Mother   . Lung cancer Father   . Heart disease Brother     CAD  . Heart disease Brother     CAD, pre diabetic, obese  . Cancer Brother     Colon CA, liver mets at 69, DM   Social History:  reports that she has never smoked. She has never used smokeless tobacco. She reports that she does not drink alcohol or use drugs.  Allergies:  Allergies  Allergen Reactions  . Codeine     REACTION: syncope  . Sumatriptan     REACTION: choking sensation     (Not in a hospital admission)  Results for orders placed or performed during the hospital encounter of 02/18/16 (from the past 48 hour(s))  Lipase, blood     Status: None   Collection Time: 02/18/16 10:10 AM  Result Value Ref Range   Lipase 22 11 - 51 U/L  Comprehensive metabolic panel     Status: Abnormal   Collection Time: 02/18/16 10:10 AM  Result Value Ref Range   Sodium 138 135 - 145 mmol/L   Potassium  3.8 3.5 - 5.1 mmol/L   Chloride 105 101 - 111 mmol/L   CO2 23 22 - 32 mmol/L   Glucose, Bld 113 (H) 65 - 99 mg/dL   BUN 16 6 - 20 mg/dL   Creatinine, Ser 0.60 0.44 - 1.00 mg/dL   Calcium 9.6 8.9 - 10.3 mg/dL   Total Protein 7.2 6.5 - 8.1 g/dL   Albumin 4.1 3.5 - 5.0 g/dL   AST 22 15 - 41 U/L   ALT 19 14 - 54 U/L   Alkaline Phosphatase 75 38 - 126 U/L   Total Bilirubin 0.6 0.3 - 1.2 mg/dL   GFR calc non Af Amer >60 >60 mL/min   GFR calc Af Amer >60 >60 mL/min    Comment: (NOTE) The eGFR has been calculated using the CKD EPI equation. This calculation has not been validated in all clinical situations. eGFR's persistently <60 mL/min signify possible Chronic Kidney Disease.    Anion gap 10 5 - 15  CBC     Status: Abnormal   Collection Time: 02/18/16 10:10 AM  Result Value Ref Range  WBC 18.8 (H) 4.0 - 10.5 K/uL   RBC 4.17 3.87 - 5.11 MIL/uL   Hemoglobin 13.0 12.0 - 15.0 g/dL   HCT 38.0 36.0 - 46.0 %   MCV 91.1 78.0 - 100.0 fL   MCH 31.2 26.0 - 34.0 pg   MCHC 34.2 30.0 - 36.0 g/dL   RDW 13.4 11.5 - 15.5 %   Platelets 339 150 - 400 K/uL  Urinalysis, Routine w reflex microscopic     Status: Abnormal   Collection Time: 02/18/16 11:20 AM  Result Value Ref Range   Color, Urine YELLOW YELLOW   APPearance CLEAR CLEAR   Specific Gravity, Urine 1.005 1.005 - 1.030   pH 7.5 5.0 - 8.0   Glucose, UA NEGATIVE NEGATIVE mg/dL   Hgb urine dipstick NEGATIVE NEGATIVE   Bilirubin Urine NEGATIVE NEGATIVE   Ketones, ur 15 (A) NEGATIVE mg/dL   Protein, ur NEGATIVE NEGATIVE mg/dL   Nitrite NEGATIVE NEGATIVE   Leukocytes, UA NEGATIVE NEGATIVE    Comment: MICROSCOPIC NOT DONE ON URINES WITH NEGATIVE PROTEIN, BLOOD, LEUKOCYTES, NITRITE, OR GLUCOSE <1000 mg/dL.   Ct Abdomen Pelvis W Contrast  Result Date: 02/18/2016 CLINICAL DATA:  Pt with RLQ pain that has gotten worse since yesterday-radiates to right flank pain with sharp episodes Intermittent nausea but no vomiting or diarrhea Hx of  hysterectomy No hx of CA. EXAM: CT ABDOMEN AND PELVIS WITH CONTRAST TECHNIQUE: Multidetector CT imaging of the abdomen and pelvis was performed using the standard protocol following bolus administration of intravenous contrast. CONTRAST:  149m ISOVUE-300 IOPAMIDOL (ISOVUE-300) INJECTION 61% COMPARISON:  None. FINDINGS: Lower chest: No acute abnormality. Hepatobiliary: 2 cm stone within the otherwise normal-appearing gallbladder. No evidence of cholecystitis. No bile duct dilatation. Scattered small hypodense foci within the liver, too small to definitively characterize but most likely small benign cysts. Pancreas: Unremarkable. No pancreatic ductal dilatation or surrounding inflammatory changes. Spleen: Normal in size without focal abnormality. Adrenals/Urinary Tract: Adrenal glands appear normal. Kidneys are unremarkable without mass, stone or hydronephrosis. No ureteral or bladder calculi identified. Bladder appears normal. Stomach/Bowel: Appendix is distended to 1.3 cm. Walls of the appendix are thickened and enhancing throughout and there is prominent periappendiceal fluid stranding, indicating acute appendicitis. No periappendiceal abscess collection. No free intraperitoneal air. No dilated large or small bowel loops.  Stomach appears normal. Vascular/Lymphatic: No significant vascular findings are present. No enlarged abdominal or pelvic lymph nodes. Reproductive: Unremarkable. Other: No abscess collection.  No free intraperitoneal air. Musculoskeletal: Mild degenerative change within the lumbar spine. No acute or suspicious osseous finding. IMPRESSION: Acute appendicitis. Appendix distended to 1.3 cm. Walls of the appendix are thickened/enhancing and there is prominent periappendiceal fluid stranding. No abscess collection seen. No free intraperitoneal air. Cholelithiasis without evidence of acute cholecystitis. These results were called by telephone at the time of interpretation on 02/18/2016 at 12:03 pm to  Dr. SEzequiel Essex, who verbally acknowledged these results. Electronically Signed   By: SFranki CabotM.D.   On: 02/18/2016 12:05    Review of Systems  Constitutional: Negative for chills, fever, malaise/fatigue and weight loss.  HENT: Negative for ear discharge, ear pain, hearing loss and tinnitus.   Eyes: Negative for blurred vision, double vision, photophobia and pain.  Respiratory: Negative for cough, hemoptysis and sputum production.   Cardiovascular: Negative for chest pain, palpitations, orthopnea and claudication.  Gastrointestinal: Positive for abdominal pain and nausea. Negative for blood in stool, constipation, diarrhea, heartburn and vomiting.  Genitourinary: Negative for dysuria, flank pain, hematuria and  urgency.  Skin: Negative for itching and rash.  Neurological: Negative for dizziness, tremors, speech change and seizures.    Blood pressure 137/94, pulse 73, temperature 98.1 F (36.7 C), temperature source Oral, resp. rate 16, weight 66.3 kg (146 lb 3.2 oz), SpO2 98 %. Physical Exam  Constitutional: She is oriented to person, place, and time. She appears well-developed and well-nourished. No distress.  HENT:  Head: Normocephalic and atraumatic.  Right Ear: External ear normal.  Left Ear: External ear normal.  Eyes: Conjunctivae and EOM are normal. Pupils are equal, round, and reactive to light. Right eye exhibits no discharge. Left eye exhibits no discharge. No scleral icterus.  Neck: Normal range of motion. Neck supple. No JVD present. No tracheal deviation present. No thyromegaly present.  Cardiovascular: Normal rate, regular rhythm and normal heart sounds.  Exam reveals no gallop and no friction rub.   No murmur heard. Respiratory: Effort normal and breath sounds normal. No stridor. No respiratory distress. She has no wheezes. She has no rales. She exhibits no tenderness.  GI: Soft. Bowel sounds are normal. She exhibits no distension and no mass. There is tenderness  (RLQ). There is tenderness at McBurney's point. There is no rebound and no guarding.  Musculoskeletal: Normal range of motion. She exhibits no edema or deformity.  Neurological: She is alert and oriented to person, place, and time. No cranial nerve deficit. Coordination normal.  Skin: Skin is warm. She is not diaphoretic.     Assessment/Plan 60 y/o F with acute appendicitis -IV abx -To OR for Lap appy -I discussed with the patient the risks benefits of the procedure to include but not limited to: Infection, bleeding, damage to surrounding structures, possible ileus, possible postoperative infection. Patient voiced understanding and wishes to proceed.   Reyes Ivan, MD 02/18/2016, 1:11 PM

## 2016-02-18 NOTE — Transfer of Care (Signed)
Immediate Anesthesia Transfer of Care Note  Patient: NOKOMIS KAZMER  Procedure(s) Performed: Procedure(s): APPENDECTOMY LAPAROSCOPIC (N/A)  Patient Location: PACU  Anesthesia Type:General  Level of Consciousness: awake  Airway & Oxygen Therapy: Patient Spontanous Breathing  Post-op Assessment: Report given to RN and Post -op Vital signs reviewed and stable  Post vital signs: Reviewed and stable  Last Vitals:  Vitals:   02/18/16 1341 02/18/16 1611  BP: 121/61 (P) 101/86  Pulse: 72 (!) (P) 112  Resp: 18 (P) 19  Temp:  (P) 36.3 C    Last Pain:  Vitals:   02/18/16 1341  TempSrc:   PainSc: 0-No pain         Complications: No apparent anesthesia complications

## 2016-02-18 NOTE — Anesthesia Preprocedure Evaluation (Signed)
Anesthesia Evaluation  Patient identified by MRN, date of birth, ID band Patient awake    Reviewed: Allergy & Precautions, NPO status , Patient's Chart, lab work & pertinent test results  Airway Mallampati: I  TM Distance: >3 FB Neck ROM: Full    Dental  (+) Teeth Intact, Dental Advisory Given   Pulmonary    breath sounds clear to auscultation       Cardiovascular  Rhythm:Regular Rate:Normal     Neuro/Psych    GI/Hepatic   Endo/Other    Renal/GU      Musculoskeletal   Abdominal   Peds  Hematology   Anesthesia Other Findings   Reproductive/Obstetrics                             Anesthesia Physical Anesthesia Plan  ASA: I and emergent  Anesthesia Plan: General   Post-op Pain Management:    Induction: Intravenous, Rapid sequence and Cricoid pressure planned  Airway Management Planned: Oral ETT  Additional Equipment:   Intra-op Plan:   Post-operative Plan: Extubation in OR  Informed Consent: I have reviewed the patients History and Physical, chart, labs and discussed the procedure including the risks, benefits and alternatives for the proposed anesthesia with the patient or authorized representative who has indicated his/her understanding and acceptance.   Dental advisory given  Plan Discussed with: CRNA, Anesthesiologist and Surgeon  Anesthesia Plan Comments:         Anesthesia Quick Evaluation

## 2016-02-18 NOTE — ED Notes (Signed)
Attempted lab draw, unsuccessful.

## 2016-02-18 NOTE — ED Provider Notes (Signed)
[facility] DEPT Provider Note   CSN: [id number] Arrival date & time: 02/18/16  [id number]     History   Chief Complaint Chief Complaint  Patient presents with  . Abdominal Pain    HPI [name] is a 60 y.o. female.  Patient presents with lower abdominal pain that started yesterday afternoon and has been constantly worsening. Pain has now radiated to the right lower quadrant and right flank. Denies any previous history of the same. No history of kidney stones. She's had nausea and dry heaving without vomiting. She has had chills but no document a fever. No vaginal bleeding or discharge. Previous hysterectomy. Denies any chest pain or shortness of breath. Pain is worse with palpation of the breathing. Denies any diarrhea or constipation.   The history is provided by the patient.  Abdominal Pain   Associated symptoms include nausea. Pertinent negatives include fever, diarrhea, vomiting, constipation, dysuria, hematuria and headaches.    Past Medical History:  Diagnosis Date  . Anemia   . Cutaneous sarcoidosis ([place])   . Degenerative disc disease   . Foot pain   . Migraine   . Zoster    Opthy (regular checks)    Patient Active Problem List   Diagnosis Date Noted  . ETD (eustachian tube dysfunction) 11/27/2015  . Encounter for routine gynecological examination 11/04/2014  . Spinal stenosis of lumbar region 04/02/2013  . Hemorrhoids, internal 04/02/2013  . Low back pain potentially associated with radiculopathy 03/29/2013  . Routine general medical examination at a health care facility 06/09/2011  . ALLERGIC RHINITIS 03/10/2009  . HERPES ZOSTER OPHTHALMICUS 08/28/2006  . MIGRAINES, HX OF 08/28/2006    Past Surgical History:  Procedure Laterality Date  . 2D Echo  1999   Negative  . ABDOMINAL HYSTERECTOMY  11/99  . Arm lesion, cutaneous sarcoid  12/2005   with normal CXR and ANA  . CESAREAN SECTION     two times  . COLONOSCOPY  11/0/2009   diverticulosis and  hemorrhoids  . ROTATOR CUFF REPAIR    . TONSILLECTOMY    . UPPER GASTROINTESTINAL ENDOSCOPY  11/15/2010   non-erosive gastritis, H. pylori negative    OB History    No data available       Home Medications    Prior to Admission medications   Medication Sig Start Date End Date Taking? Authorizing Provider  aspirin 81 MG tablet Take 81 mg by mouth daily.     Historical Provider, MD  Brinzolamide-Brimonidine [facility]) 1-0.2 % SUSP Place 1 drop into the left eye daily.    Historical Provider, MD  Calcium Carbonate (CALCIUM 500 PO) Take 1 capsule by mouth daily.      Historical Provider, MD  docusate sodium (COLACE) 100 MG capsule Take 100 mg by mouth daily.    Historical Provider, MD  ferrous sulfate 325 (65 FE) MG tablet Take 325 mg by mouth daily.    Historical Provider, MD  fluticasone (FLONASE) 50 MCG/ACT nasal spray USE ONE SPRAY(S) IN EACH NOSTRIL ONCE DAILY AS DIRECTED 11/27/15   Abner Greenspan, MD  meloxicam (MOBIC) 15 MG tablet Take 1 tablet (15 mg total) by mouth daily. With food 11/27/15   Abner Greenspan, MD  Multiple Vitamins-Minerals (ONE-A-DAY WOMENS 50 PLUS PO) Take 1 tablet by mouth daily.      Historical Provider, MD  prednisoLONE acetate (PRED FORTE) 1 % ophthalmic suspension Place 1 drop into the left eye 3 (three) times daily.     Historical Provider, MD  Family History Family History  Problem Relation Age of Onset  . Colon cancer Brother   . Heart disease Mother     CAD  . Alcohol abuse Mother   . Lung cancer Father   . Heart disease Brother     CAD  . Heart disease Brother     CAD, pre diabetic, obese  . Cancer Brother     Colon CA, liver mets at 58, DM    Social History Social History  Substance Use Topics  . Smoking status: Never Smoker  . Smokeless tobacco: Never Used  . Alcohol use No     Allergies   Codeine and Sumatriptan   Review of Systems Review of Systems  Constitutional: Positive for activity change, appetite change and chills.  Negative for fever.  Respiratory: Negative for cough, chest tightness and shortness of breath.   Cardiovascular: Negative for chest pain.  Gastrointestinal: Positive for abdominal pain and nausea. Negative for constipation, diarrhea and vomiting.  Genitourinary: Positive for flank pain. Negative for dysuria, hematuria, vaginal bleeding and vaginal discharge.  Musculoskeletal: Positive for back pain. Negative for neck pain and neck stiffness.  Neurological: Negative for dizziness, weakness, light-headedness and headaches.   A complete 10 system review of systems was obtained and all systems are negative except as noted in the HPI and PMH.    Physical Exam Updated Vital Signs BP 119/77 (BP Location: Right Arm)   Pulse 85   Temp 98.1 F (36.7 C) (Oral)   Resp 16   Wt 146 lb 3.2 oz (66.3 kg)   SpO2 100%   BMI 25.49 kg/m   Physical Exam  Constitutional: She is oriented to person, place, and time. She appears well-developed and well-nourished. No distress.  HENT:  Head: Normocephalic and atraumatic.  Mouth/Throat: Oropharynx is clear and moist. No oropharyngeal exudate.  Eyes: Conjunctivae and EOM are normal. Pupils are equal, round, and reactive to light.  Neck: Normal range of motion. Neck supple.  No meningismus.  Cardiovascular: Normal rate, regular rhythm, normal heart sounds and intact distal pulses.   No murmur heard. Pulmonary/Chest: Effort normal and breath sounds normal. No respiratory distress.  Abdominal: Soft. There is tenderness. There is no rebound and no guarding.  TTP RLQ and suprapubic with guarding. No rebound. Milder tenderness to RUQ  Musculoskeletal: Normal range of motion. She exhibits no edema or tenderness.  No CVAT  Neurological: She is alert and oriented to person, place, and time. No cranial nerve deficit. She exhibits normal muscle tone. Coordination normal.   5/5 strength throughout. CN 2-12 intact.Equal grip strength.   Skin: Skin is warm. Capillary  refill takes less than 2 seconds.  Psychiatric: She has a normal mood and affect. Her behavior is normal.  Nursing note and vitals reviewed.    ED Treatments / Results  Labs (all labs ordered are listed, but only abnormal results are displayed) Labs Reviewed  COMPREHENSIVE METABOLIC PANEL - Abnormal; Notable for the following:       Result Value   Glucose, Bld 113 (*)    All other components within normal limits  CBC - Abnormal; Notable for the following:    WBC 18.8 (*)    All other components within normal limits  URINALYSIS, ROUTINE W REFLEX MICROSCOPIC (NOT AT [facility]) - Abnormal; Notable for the following:    Ketones, ur 15 (*)    All other components within normal limits  LIPASE, BLOOD  CBC  BASIC METABOLIC PANEL  SURGICAL PATHOLOGY  EKG  EKG Interpretation None       Radiology Ct Abdomen Pelvis W Contrast  Result Date: 02/18/2016 CLINICAL DATA:  Pt with RLQ pain that has gotten worse since yesterday-radiates to right flank pain with sharp episodes Intermittent nausea but no vomiting or diarrhea Hx of hysterectomy No hx of CA. EXAM: CT ABDOMEN AND PELVIS WITH CONTRAST TECHNIQUE: Multidetector CT imaging of the abdomen and pelvis was performed using the standard protocol following bolus administration of intravenous contrast. CONTRAST:  141mL ISOVUE-300 IOPAMIDOL (ISOVUE-300) INJECTION 61% COMPARISON:  None. FINDINGS: Lower chest: No acute abnormality. Hepatobiliary: 2 cm stone within the otherwise normal-appearing gallbladder. No evidence of cholecystitis. No bile duct dilatation. Scattered small hypodense foci within the liver, too small to definitively characterize but most likely small benign cysts. Pancreas: Unremarkable. No pancreatic ductal dilatation or surrounding inflammatory changes. Spleen: Normal in size without focal abnormality. Adrenals/Urinary Tract: Adrenal glands appear normal. Kidneys are unremarkable without mass, stone or hydronephrosis. No ureteral or  bladder calculi identified. Bladder appears normal. Stomach/Bowel: Appendix is distended to 1.3 cm. Walls of the appendix are thickened and enhancing throughout and there is prominent periappendiceal fluid stranding, indicating acute appendicitis. No periappendiceal abscess collection. No free intraperitoneal air. No dilated large or small bowel loops.  Stomach appears normal. Vascular/Lymphatic: No significant vascular findings are present. No enlarged abdominal or pelvic lymph nodes. Reproductive: Unremarkable. Other: No abscess collection.  No free intraperitoneal air. Musculoskeletal: Mild degenerative change within the lumbar spine. No acute or suspicious osseous finding. IMPRESSION: Acute appendicitis. Appendix distended to 1.3 cm. Walls of the appendix are thickened/enhancing and there is prominent periappendiceal fluid stranding. No abscess collection seen. No free intraperitoneal air. Cholelithiasis without evidence of acute cholecystitis. These results were called by telephone at the time of interpretation on 02/18/2016 at 12:03 pm to Dr. Ezequiel Essex , who verbally acknowledged these results. Electronically Signed   By: Franki Cabot M.D.   On: 02/18/2016 12:05    Procedures Procedures (including critical care time)  Medications Ordered in ED Medications  iopamidol (ISOVUE-300) 61 % injection (not administered)  dextrose 5 %-0.9 % sodium chloride infusion ( Intravenous New Bag/Given 02/18/16 1804)  cefTRIAXone (ROCEPHIN) 2 g in dextrose 5 % 50 mL IVPB (not administered)    And  metroNIDAZOLE (FLAGYL) IVPB 500 mg (500 mg Intravenous Given 02/18/16 2111)  HYDROmorphone (DILAUDID) injection 1-2 mg (not administered)  oxyCODONE (Oxy IR/ROXICODONE) immediate release tablet 5-10 mg (not administered)  ondansetron (ZOFRAN-ODT) disintegrating tablet 4 mg (not administered)    Or  ondansetron (ZOFRAN) injection 4 mg (not administered)  sodium chloride 0.9 % bolus 1,000 mL (0 mLs Intravenous  Stopped 02/18/16 1119)  ondansetron (ZOFRAN) injection 4 mg (4 mg Intravenous Given 02/18/16 1011)  fentaNYL (SUBLIMAZE) injection 25 mcg (25 mcg Intravenous Given 02/18/16 1010)  iopamidol (ISOVUE-300) 61 % injection (100 mLs  Contrast Given 02/18/16 1135)  cefTRIAXone (ROCEPHIN) 2 g in dextrose 5 % 50 mL IVPB (2 g Intravenous New Bag/Given 02/18/16 1341)    And  metroNIDAZOLE (FLAGYL) IVPB 500 mg (0 mg Intravenous Stopped 02/18/16 1341)  fentaNYL (SUBLIMAZE) injection 50 mcg (50 mcg Intravenous Given 02/18/16 1256)     Initial Impression / Assessment and Plan / ED Course  I have reviewed the triage vital signs and the nursing notes.  Pertinent labs & imaging results that were available during my care of the patient were reviewed by me and considered in my medical decision making (see chart for details).  Clinical Course  R sided abdominal and flank pain with nausea and dry heaves.   Urinalysis negative. Leukocytosis noted.  CT consistent with uncomplicated appendicitis. Antibiotics given. Discussed with surgery, Dr. Rosendo Gros.  Final Clinical Impressions(s) / ED Diagnoses   Final diagnoses:  Acute appendicitis with localized peritonitis    New Prescriptions New Prescriptions   No medications on file     Ezequiel Essex, MD 02/18/16 2216

## 2016-02-18 NOTE — ED Triage Notes (Signed)
Pt reports intermittent abd cramps yesterday that radiates to the right flank area. Pt denies hx of same. Pt also reports some nausea without vomiting.

## 2016-02-18 NOTE — Anesthesia Postprocedure Evaluation (Signed)
Anesthesia Post Note  Patient: Megan Harrison  Procedure(s) Performed: Procedure(s) (LRB): APPENDECTOMY LAPAROSCOPIC (N/A)  Patient location during evaluation: PACU Anesthesia Type: General Level of consciousness: awake and alert Pain management: pain level controlled Vital Signs Assessment: post-procedure vital signs reviewed and stable Respiratory status: spontaneous breathing, nonlabored ventilation and respiratory function stable Cardiovascular status: blood pressure returned to baseline and stable Postop Assessment: no signs of nausea or vomiting Anesthetic complications: no    Last Vitals:  Vitals:   02/18/16 1341 02/18/16 1611  BP: 121/61 101/86  Pulse: 72 (!) 112  Resp: 18 19  Temp:  36.3 C    Last Pain:  Vitals:   02/18/16 1630  TempSrc:   PainSc: 5                  Roma Bierlein A

## 2016-02-19 ENCOUNTER — Encounter (HOSPITAL_COMMUNITY): Payer: Self-pay | Admitting: General Surgery

## 2016-02-19 DIAGNOSIS — K3580 Unspecified acute appendicitis: Secondary | ICD-10-CM | POA: Diagnosis present

## 2016-02-19 LAB — CBC
HCT: 30.7 % — ABNORMAL LOW (ref 36.0–46.0)
Hemoglobin: 9.9 g/dL — ABNORMAL LOW (ref 12.0–15.0)
MCH: 29.6 pg (ref 26.0–34.0)
MCHC: 32.2 g/dL (ref 30.0–36.0)
MCV: 91.9 fL (ref 78.0–100.0)
PLATELETS: 282 10*3/uL (ref 150–400)
RBC: 3.34 MIL/uL — AB (ref 3.87–5.11)
RDW: 13.8 % (ref 11.5–15.5)
WBC: 10.3 10*3/uL (ref 4.0–10.5)

## 2016-02-19 LAB — BASIC METABOLIC PANEL
Anion gap: 5 (ref 5–15)
BUN: 11 mg/dL (ref 6–20)
CO2: 26 mmol/L (ref 22–32)
Calcium: 8.4 mg/dL — ABNORMAL LOW (ref 8.9–10.3)
Chloride: 108 mmol/L (ref 101–111)
Creatinine, Ser: 0.64 mg/dL (ref 0.44–1.00)
GFR calc Af Amer: >60 mL/min (ref >60)
GLUCOSE: 128 mg/dL — AB (ref 65–99)
POTASSIUM: 3.7 mmol/L (ref 3.5–5.1)
Sodium: 139 mmol/L (ref 135–145)

## 2016-02-19 MED ORDER — ACETAMINOPHEN 325 MG PO TABS: 650.0000 mg | ORAL_TABLET | Freq: Four times a day (QID) | ORAL | Status: DC | PRN

## 2016-02-19 NOTE — Progress Notes (Signed)
Discharge home. Home discharge instruction given, no question verbalized. 

## 2016-02-19 NOTE — Progress Notes (Signed)
Patient ID: Megan Harrison, female   DOB: 01/24/1956, 60 y.o.   MRN: [id number]  [facility] Surgery Progress Note  1 Day Post-Op  Subjective: Sore but feeling better than she did prior to surgery. Asking when she can go home. Tolerating diet.  Objective: Vital signs in last 24 hours: Temp:  [97.3 F (36.3 C)-99.7 F (37.6 C)] 99.7 F (37.6 C) (10/16 0515) Pulse Rate:  [72-112] 80 (10/16 0515) Resp:  [16-19] 18 (10/16 0515) BP: (101-140)/(57-94) 115/57 (10/16 0515) SpO2:  [95 %-100 %] 97 % (10/16 0515) Weight:  [146 lb 3.2 oz (66.3 kg)] 146 lb 3.2 oz (66.3 kg) (10/15 0738) Last BM Date: 02/17/16  Intake/Output from previous day: 10/15 0701 - 10/16 0700 In: 4773.3 [P.O.:880; I.V.:2593.3; IV Piggyback:1300] Out: 700 [Urine:650; Blood:50] Intake/Output this shift: No intake/output data recorded.  PE: Gen:  Alert, NAD, pleasant Abd: Soft, minimally distended, +BS, incisions C/D/I  Lab Results:   Recent Labs  02/18/16 1010 02/19/16 0424  WBC 18.8* 10.3  HGB 13.0 9.9*  HCT 38.0 30.7*  PLT 339 282   BMET  Recent Labs  02/18/16 1010 02/19/16 0424  NA 138 139  K 3.8 3.7  CL 105 108  CO2 23 26  GLUCOSE 113* 128*  BUN 16 11  CREATININE 0.60 0.64  CALCIUM 9.6 8.4*   PT/INR No results for input(s): LABPROT, INR in the last 72 hours. CMP     Component Value Date/Time   NA 139 02/19/2016 0424   K 3.7 02/19/2016 0424   CL 108 02/19/2016 0424   CO2 26 02/19/2016 0424   GLUCOSE 128 (H) 02/19/2016 0424   BUN 11 02/19/2016 0424   CREATININE 0.64 02/19/2016 0424   CALCIUM 8.4 (L) 02/19/2016 0424   PROT 7.2 02/18/2016 1010   ALBUMIN 4.1 02/18/2016 1010   AST 22 02/18/2016 1010   ALT 19 02/18/2016 1010   ALKPHOS 75 02/18/2016 1010   BILITOT 0.6 02/18/2016 1010   GFRNONAA >60 02/19/2016 0424   GFRAA >60 02/19/2016 0424   Lipase     Component Value Date/Time   LIPASE 22 02/18/2016 1010       Studies/Results: Ct Abdomen Pelvis W Contrast  Result  Date: 02/18/2016 CLINICAL DATA:  Pt with RLQ pain that has gotten worse since yesterday-radiates to right flank pain with sharp episodes Intermittent nausea but no vomiting or diarrhea Hx of hysterectomy No hx of CA. EXAM: CT ABDOMEN AND PELVIS WITH CONTRAST TECHNIQUE: Multidetector CT imaging of the abdomen and pelvis was performed using the standard protocol following bolus administration of intravenous contrast. CONTRAST:  122mL ISOVUE-300 IOPAMIDOL (ISOVUE-300) INJECTION 61% COMPARISON:  None. FINDINGS: Lower chest: No acute abnormality. Hepatobiliary: 2 cm stone within the otherwise normal-appearing gallbladder. No evidence of cholecystitis. No bile duct dilatation. Scattered small hypodense foci within the liver, too small to definitively characterize but most likely small benign cysts. Pancreas: Unremarkable. No pancreatic ductal dilatation or surrounding inflammatory changes. Spleen: Normal in size without focal abnormality. Adrenals/Urinary Tract: Adrenal glands appear normal. Kidneys are unremarkable without mass, stone or hydronephrosis. No ureteral or bladder calculi identified. Bladder appears normal. Stomach/Bowel: Appendix is distended to 1.3 cm. Walls of the appendix are thickened and enhancing throughout and there is prominent periappendiceal fluid stranding, indicating acute appendicitis. No periappendiceal abscess collection. No free intraperitoneal air. No dilated large or small bowel loops.  Stomach appears normal. Vascular/Lymphatic: No significant vascular findings are present. No enlarged abdominal or pelvic lymph nodes. Reproductive: Unremarkable. Other: No abscess collection.  No free intraperitoneal air. Musculoskeletal: Mild degenerative change within the lumbar spine. No acute or suspicious osseous finding. IMPRESSION: Acute appendicitis. Appendix distended to 1.3 cm. Walls of the appendix are thickened/enhancing and there is prominent periappendiceal fluid stranding. No abscess  collection seen. No free intraperitoneal air. Cholelithiasis without evidence of acute cholecystitis. These results were called by telephone at the time of interpretation on 02/18/2016 at 12:03 pm to Dr. Ezequiel Essex , who verbally acknowledged these results. Electronically Signed   By: Franki Cabot M.D.   On: 02/18/2016 12:05    Anti-infectives: Anti-infectives    Start     Dose/Rate Route Frequency Ordered Stop   02/19/16 1330  cefTRIAXone (ROCEPHIN) 2 g in dextrose 5 % 50 mL IVPB     2 g 100 mL/hr over 30 Minutes Intravenous Every 24 hours 02/18/16 1817 02/20/16 1329   02/18/16 2030  metroNIDAZOLE (FLAGYL) IVPB 500 mg     500 mg 100 mL/hr over 60 Minutes Intravenous Every 8 hours 02/18/16 1817 02/19/16 2029   02/18/16 1215  cefTRIAXone (ROCEPHIN) 2 g in dextrose 5 % 50 mL IVPB     2 g 100 mL/hr over 30 Minutes Intravenous  Once 02/18/16 1212 02/18/16 1411   02/18/16 1215  metroNIDAZOLE (FLAGYL) IVPB 500 mg     500 mg 100 mL/hr over 60 Minutes Intravenous  Once 02/18/16 1212 02/18/16 1341       Assessment/Plan S/p APPENDECTOMY LAPAROSCOPIC 02/18/16 Dr. Rosendo Gros - POD 1 - tolerating diet, pain controlled, ambulating  FEN - regular VTE - SCDs  Plan - ready for discharge later this morning.   Patient will follow-up with Dr. Rosendo Gros in about 3 weeks.   She will not need antibiotics.   LOS: 0 days    Jerrye Beavers , [facility] Surgery 02/19/2016, 7:33 AM Pager: [phone] Consults: [phone] Mon-Fri 7:00 am-4:30 pm Sat-Sun 7:00 am-11:30 am  Agree with above.  Alphonsa Overall, MD, [facility] Surgery Pager: [phone] Office phone:  [phone]

## 2016-02-19 NOTE — Discharge Instructions (Signed)

## 2016-02-21 NOTE — Discharge Summary (Addendum)
  [facility] Discharge Summary   Patient ID: Megan Harrison MRN: [id number] DOB/AGE: June 12, 1955 60 y.o.  Admit date: 02/18/2016 Discharge date: 02/20/2016  Admitting Diagnosis: Acute appendicitis  Discharge Diagnosis Patient Active Problem List   Diagnosis Date Noted  . Acute appendicitis 02/19/2016  . ETD (eustachian tube dysfunction) 11/27/2015  . Encounter for routine gynecological examination 11/04/2014  . Spinal stenosis of lumbar region 04/02/2013  . Hemorrhoids, internal 04/02/2013  . Low back pain potentially associated with radiculopathy 03/29/2013  . Routine general medical examination at a health care facility 06/09/2011  . ALLERGIC RHINITIS 03/10/2009  . HERPES ZOSTER OPHTHALMICUS 08/28/2006  . MIGRAINES, HX OF 08/28/2006    Consultants None  Imaging: CT abdomen pelvis w contrast 02/18/16: Acute appendicitis. Appendix distended to 1.3 cm. Walls of the appendix are thickened/enhancing and there is prominent periappendiceal fluid stranding. No abscess collection seen. No free intraperitoneal air. Cholelithiasis without evidence of acute cholecystitis.  Procedures Dr. Rosendo Gros (02/18/16) - Laparoscopic Appendectomy  Hospital Course:  Megan Harrison is a 60yo female who presented to [facility] 02/18/16 with 1 day history of RLQ abdominal pain.  Workup included CT scan which showed acute appendicitis.  Patient was admitted and underwent procedure listed above.  Tolerated procedure well and was transferred to the floor.  Diet was advanced as tolerated.  On POD1 the patient was voiding well, tolerating diet, ambulating well, pain well controlled, vital signs stable, incisions c/d/i and felt stable for discharge home.  Patient will follow up in our office in 3 weeks and knows to call with questions or concerns.  She will not need any antibiotics. She will call to confirm appointment date/time.    Physical Exam: Gen:  Alert, NAD, pleasant Abd: Soft, minimally  distended, +BS, incisions C/D/I    Medication List    TAKE these medications   aspirin 81 MG tablet Take 81 mg by mouth daily.   CALCIUM 500 PO Take 1 capsule by mouth daily.   docusate sodium 100 MG capsule Commonly known as:  COLACE Take 100 mg by mouth daily.   ferrous sulfate 325 (65 FE) MG tablet Take 325 mg by mouth daily.   fluticasone 50 MCG/ACT nasal spray Commonly known as:  FLONASE USE ONE SPRAY(S) IN EACH NOSTRIL ONCE DAILY AS DIRECTED   meloxicam 15 MG tablet Commonly known as:  MOBIC Take 1 tablet (15 mg total) by mouth daily. With food   ONE-A-DAY WOMENS 50 PLUS PO Take 1 tablet by mouth daily.   prednisoLONE acetate 1 % ophthalmic suspension Commonly known as:  PRED FORTE Place 1 drop into the left eye 3 (three) times daily.   SIMBRINZA 1-0.2 % Susp Generic drug:  Brinzolamide-Brimonidine Place 1 drop into the left eye daily.        Follow-up Information    Reyes Ivan, MD. Call today.   Specialty:  General Surgery Why:  Please call as soon as you leave the hospital to make a follow-up appointment with Dr. Rosendo Gros in 3 weeks Contact information: [address] [place] [address] [phone]           Signed: Jerrye Beavers, [facility] Surgery 02/21/2016, 2:52 PM Pager: [phone] Consults: [phone] Mon-Fri 7:00 am-4:30 pm Sat-Sun 7:00 am-11:30 am  Agree with above.  Alphonsa Overall, MD, [facility] Surgery Pager: [phone] Office phone:  [phone]

## 2016-04-05 ENCOUNTER — Encounter: Payer: Self-pay | Admitting: Family Medicine

## 2016-04-05 ENCOUNTER — Ambulatory Visit (INDEPENDENT_AMBULATORY_CARE_PROVIDER_SITE_OTHER)
Admission: RE | Admit: 2016-04-05 | Discharge: 2016-04-05 | Disposition: A | Discharge status: Home / Self Care | Payer: BC Managed Care – PPO | Source: Ambulatory Visit | Attending: Family Medicine | Admitting: Family Medicine

## 2016-04-05 ENCOUNTER — Ambulatory Visit (INDEPENDENT_AMBULATORY_CARE_PROVIDER_SITE_OTHER): Payer: BC Managed Care – PPO | Admitting: Family Medicine

## 2016-04-05 VITALS — BP 122/70 | HR 76 | Temp 98.5°F | Ht 63.5 in | Wt 148.0 lb

## 2016-04-05 DIAGNOSIS — M542 Cervicalgia: Secondary | ICD-10-CM

## 2016-04-05 MED ORDER — CYCLOBENZAPRINE HCL 10 MG PO TABS: 5.0000 mg | ORAL_TABLET | Freq: Every evening | ORAL | 0 refills | Status: DC | PRN

## 2016-04-05 NOTE — Patient Instructions (Addendum)
Continue heat  Stretch in each direction gently 2-3 times per day  Look at other cervical support pillows Xray today  May consider PT   Try the flexeril at night (caution of sedation)

## 2016-04-05 NOTE — Progress Notes (Signed)
Pre visit review using our clinic review tool, if applicable. No additional management support is needed unless otherwise documented below in the visit note. 

## 2016-04-05 NOTE — Progress Notes (Signed)
Subjective:    Patient ID: Megan Harrison, female    DOB: 02-14-1956, 60 y.o.   MRN: [id number]  HPI Here with stiff neck/crick Over a mo   Tried heat and ice  Give her a headache  Takes meloxicam   Crunches - when she moves it /more than usual   Hurts mostly on the L side  Rot L hurts the most and extension  Esp bad first thing in the am   Pillow- has a foam one (years) - medium ht/ mem foam and no divot  No new exercise or activity (she goes to the gym)  No injury  No mva   Has not had neck problems before  Hx of lumbar spinal stenosis   occ R fingers tingle/not now   Patient Active Problem List   Diagnosis Date Noted  . Neck pain 04/05/2016  . Acute appendicitis 02/19/2016  . ETD (eustachian tube dysfunction) 11/27/2015  . Encounter for routine gynecological examination 11/04/2014  . Spinal stenosis of lumbar region 04/02/2013  . Hemorrhoids, internal 04/02/2013  . Low back pain potentially associated with radiculopathy 03/29/2013  . Routine general medical examination at a health care facility 06/09/2011  . ALLERGIC RHINITIS 03/10/2009  . HERPES ZOSTER OPHTHALMICUS 08/28/2006  . MIGRAINES, HX OF 08/28/2006   Past Medical History:  Diagnosis Date  . Anemia   . Cutaneous sarcoidosis ([place])   . Degenerative disc disease   . Foot pain   . Migraine   . Zoster    Opthy (regular checks)   Past Surgical History:  Procedure Laterality Date  . 2D Echo  1999   Negative  . ABDOMINAL HYSTERECTOMY  11/99  . APPENDECTOMY    . Arm lesion, cutaneous sarcoid  12/2005   with normal CXR and ANA  . CESAREAN SECTION     two times  . COLONOSCOPY  11/0/2009   diverticulosis and hemorrhoids  . DIAGNOSTIC LAPAROSCOPY    . LAPAROSCOPIC APPENDECTOMY N/A 02/18/2016   Procedure: APPENDECTOMY LAPAROSCOPIC;  Surgeon: Ralene Ok, MD;  Location: [facility];  Service: General;  Laterality: N/A;  . ROTATOR CUFF REPAIR    . TONSILLECTOMY    . UPPER GASTROINTESTINAL ENDOSCOPY   11/15/2010   non-erosive gastritis, H. pylori negative   Social History  Substance Use Topics  . Smoking status: Never Smoker  . Smokeless tobacco: Never Used  . Alcohol use No   Family History  Problem Relation Age of Onset  . Colon cancer Brother   . Heart disease Mother     CAD  . Alcohol abuse Mother   . Lung cancer Father   . Heart disease Brother     CAD  . Heart disease Brother     CAD, pre diabetic, obese  . Cancer Brother     Colon CA, liver mets at 33, DM   Allergies  Allergen Reactions  . Codeine     REACTION: syncope  . Sumatriptan     REACTION: choking sensation   Current Outpatient Prescriptions on File Prior to Visit  Medication Sig Dispense Refill  . aspirin 81 MG tablet Take 81 mg by mouth daily.     . Brinzolamide-Brimonidine (SIMBRINZA) 1-0.2 % SUSP Place 1 drop into the left eye daily.    . Calcium Carbonate (CALCIUM 500 PO) Take 1 capsule by mouth daily.      Marland Kitchen docusate sodium (COLACE) 100 MG capsule Take 100 mg by mouth daily.    . ferrous sulfate 325 (65 FE)  MG tablet Take 325 mg by mouth daily.    . fluticasone (FLONASE) 50 MCG/ACT nasal spray USE ONE SPRAY(S) IN EACH NOSTRIL ONCE DAILY AS DIRECTED 48 g 3  . meloxicam (MOBIC) 15 MG tablet Take 1 tablet (15 mg total) by mouth daily. With food 90 tablet 3  . Multiple Vitamins-Minerals (ONE-A-DAY WOMENS 50 PLUS PO) Take 1 tablet by mouth daily.      . prednisoLONE acetate (PRED FORTE) 1 % ophthalmic suspension Place 1 drop into the left eye 3 (three) times daily.      No current facility-administered medications on file prior to visit.     Review of Systems    Review of Systems  Constitutional: Negative for fever, appetite change, fatigue and unexpected weight change.  Eyes: Negative for pain and visual disturbance.  Respiratory: Negative for cough and shortness of breath.   Cardiovascular: Negative for cp or palpitations    Gastrointestinal: Negative for nausea, diarrhea and constipation.    Genitourinary: Negative for urgency and frequency.  Skin: Negative for pallor or rash   MSK pos for neck pain on L  Neurological: Negative for weakness, light-headedness,  and headaches. pos for occ tingling in R hand  Hematological: Negative for adenopathy. Does not bruise/bleed easily.  Psychiatric/Behavioral: Negative for dysphoric mood. The patient is not nervous/anxious.      Objective:   Physical Exam  Constitutional: She appears well-developed and well-nourished. No distress.  Well app  HENT:  Head: Normocephalic and atraumatic.  Eyes: Conjunctivae and EOM are normal. Pupils are equal, round, and reactive to light. No scleral icterus.  Neck: Normal range of motion. Neck supple.  See msk exam  Cardiovascular: Normal rate and regular rhythm.   Pulmonary/Chest: Effort normal and breath sounds normal. She has no wheezes. She has no rales.  Abdominal: Soft. Bowel sounds are normal. She exhibits no distension. There is no tenderness.  Musculoskeletal: She exhibits tenderness.       Cervical back: She exhibits tenderness and spasm. She exhibits normal range of motion, no bony tenderness, no swelling, no pain and normal pulse.       Lumbar back: She exhibits decreased range of motion, tenderness and spasm. She exhibits no bony tenderness and no edema.  Tender over L para cervical musculature  Pain is worst with L rotation  Nl rom with discomfort No trapezius tenderness Nl UE exam bilat    Lymphadenopathy:    She has no cervical adenopathy.  Neurological: She is alert. She has normal strength and normal reflexes. She displays no atrophy. No cranial nerve deficit or sensory deficit. She exhibits normal muscle tone. Coordination normal.  Negative SLR  Skin: Skin is warm and dry. No rash noted. No erythema. No pallor.  Psychiatric: She has a normal mood and affect.          Assessment & Plan:   Problem List Items Addressed This Visit      Other   Neck pain    Suspect L  para cervical spasm  Disc getting a new pillow Continue heat /mobic/stretching  No neuro concerns  Xray today- in light of length of symptoms If ok-consider PT -pt aware  Will alert [place] if symptoms suddenly worsen   Hx of lumbar spinal stenosis       Relevant Orders   DG Cervical Spine Complete (Completed)

## 2016-04-05 NOTE — Assessment & Plan Note (Signed)
Suspect L para cervical spasm  Disc getting a new pillow Continue heat /mobic/stretching  No neuro concerns  Xray today- in light of length of symptoms If ok-consider PT -pt aware  Will alert [place] if symptoms suddenly worsen   Hx of lumbar spinal stenosis

## 2016-05-31 ENCOUNTER — Other Ambulatory Visit: Payer: Self-pay | Admitting: Family Medicine

## 2016-05-31 DIAGNOSIS — Z1231 Encounter for screening mammogram for malignant neoplasm of breast: Secondary | ICD-10-CM

## 2016-06-10 ENCOUNTER — Ambulatory Visit
Admission: RE | Admit: 2016-06-10 | Discharge: 2016-06-10 | Disposition: A | Discharge status: Home / Self Care | Payer: BC Managed Care – PPO | Source: Ambulatory Visit | Attending: Family Medicine | Admitting: Family Medicine

## 2016-06-10 DIAGNOSIS — Z1231 Encounter for screening mammogram for malignant neoplasm of breast: Secondary | ICD-10-CM

## 2016-09-02 ENCOUNTER — Telehealth: Payer: Self-pay | Admitting: Family Medicine

## 2016-09-02 ENCOUNTER — Ambulatory Visit (INDEPENDENT_AMBULATORY_CARE_PROVIDER_SITE_OTHER): Payer: BC Managed Care – PPO | Admitting: Family Medicine

## 2016-09-02 ENCOUNTER — Encounter: Payer: Self-pay | Admitting: Family Medicine

## 2016-09-02 VITALS — BP 120/70 | HR 68 | Temp 97.9°F | Ht 63.5 in | Wt 160.5 lb

## 2016-09-02 DIAGNOSIS — M542 Cervicalgia: Secondary | ICD-10-CM | POA: Diagnosis not present

## 2016-09-02 DIAGNOSIS — M5030 Other cervical disc degeneration, unspecified cervical region: Secondary | ICD-10-CM | POA: Diagnosis not present

## 2016-09-02 NOTE — Progress Notes (Signed)
Dr. Frederico Hamman T. Amylee Lodato, MD, [facility] [place], [address] Phone: [phone] Fax: [phone]  09/02/2016  Patient: Megan Harrison, MRN: [id number], DOB: Jul 06, 1955, 61 y.o.  Primary Physician:  Loura Pardon, MD   Chief Complaint  Patient presents with  . Neck Pain   Subjective:   Megan Harrison is a 61 y.o. very pleasant female patient who presents with the following:  Neck pain: Stiff neck. Ongoing for about 6 months. Keeps getting worse an on the l side. All on the left side. She is currently not having any numbness or tingling, but she has had some occasionally or rarely.  She does have some degenerative disc disease in her neck.  She currently taking meloxicam, and she also tried previously some Flexeril.  Has done some informal massage therapy.   ? A little tingling and will go to sleep at night. None now.  Past Medical History, Surgical History, Social History, Family History, Problem List, Medications, and Allergies have been reviewed and updated if relevant.  Patient Active Problem List   Diagnosis Date Noted  . Neck pain 04/05/2016  . Acute appendicitis 02/19/2016  . ETD (eustachian tube dysfunction) 11/27/2015  . Encounter for routine gynecological examination 11/04/2014  . Spinal stenosis of lumbar region 04/02/2013  . Hemorrhoids, internal 04/02/2013  . Low back pain potentially associated with radiculopathy 03/29/2013  . Routine general medical examination at a health care facility 06/09/2011  . ALLERGIC RHINITIS 03/10/2009  . HERPES ZOSTER OPHTHALMICUS 08/28/2006  . MIGRAINES, HX OF 08/28/2006    Past Medical History:  Diagnosis Date  . Anemia   . Cutaneous sarcoidosis   . Degenerative disc disease   . Foot pain   . Migraine   . Zoster    Opthy (regular checks)    Past Surgical History:  Procedure Laterality Date  . 2D Echo  1999   Negative  . ABDOMINAL HYSTERECTOMY  11/99  .  APPENDECTOMY    . Arm lesion, cutaneous sarcoid  12/2005   with normal CXR and ANA  . CESAREAN SECTION     two times  . COLONOSCOPY  11/0/2009   diverticulosis and hemorrhoids  . DIAGNOSTIC LAPAROSCOPY    . LAPAROSCOPIC APPENDECTOMY N/A 02/18/2016   Procedure: APPENDECTOMY LAPAROSCOPIC;  Surgeon: Ralene Ok, MD;  Location: [facility];  Service: General;  Laterality: N/A;  . ROTATOR CUFF REPAIR    . TONSILLECTOMY    . UPPER GASTROINTESTINAL ENDOSCOPY  11/15/2010   non-erosive gastritis, H. pylori negative    Social History   Social History  . Marital status: Married    Spouse name: N/A  . Number of children: N/A  . Years of education: N/A   Occupational History  . Works for [employer] Retired   Social History Main Topics  . Smoking status: Never Smoker  . Smokeless tobacco: Never Used  . Alcohol use No  . Drug use: No  . Sexual activity: Yes   Other Topics Concern  . Not on file   Social History Narrative   Regular exercise:  Curves, elliptical, walking.    Family History  Problem Relation Age of Onset  . Colon cancer Brother   . Heart disease Mother     CAD  . Alcohol abuse Mother   . Lung cancer Father   . Heart disease Brother     CAD  . Heart disease Brother     CAD, pre diabetic, obese  .  Cancer Brother     Colon CA, liver mets at 19, DM    Allergies  Allergen Reactions  . Codeine     REACTION: syncope  . Sumatriptan     REACTION: choking sensation    Medication list reviewed and updated in full in [place].  GEN: No fevers, chills. Nontoxic. Primarily MSK c/o today. MSK: Detailed in the HPI GI: tolerating PO intake without difficulty Neuro: No numbness, parasthesias, or tingling associated. Otherwise the pertinent positives of the ROS are noted above.   Objective:   BP 120/70   Pulse 68   Temp 97.9 F (36.6 C) (Oral)   Ht 5' 3.5" (1.613 m)   Wt 160 lb 8 oz (72.8 kg)   BMI 27.99 kg/m    GEN:  Well-developed,well-nourished,in no acute distress; alert,appropriate and cooperative throughout examination HEENT: Normocephalic and atraumatic without obvious abnormalities. Ears, externally no deformities PULM: Breathing comfortably in no respiratory distress EXT: No clubbing, cyanosis, or edema PSYCH: Normally interactive. Cooperative during the interview. Pleasant. Friendly and conversant. Not anxious or depressed appearing. Normal, full affect.  CERVICAL SPINE EXAM Range of motion: Flexion, extension, lateral bending, and rotation: reasonably preserved with some modest restriction bending to the right. Pain with terminal motion: mild. Spinous Processes: NT SCM: NT Upper paracervical muscles: there is tenderness in the occiput on the left and additionally at the upper border of the scapula on the left. Upper traps: NT C5-T1 intact, sensation and motor   Radiology: CLINICAL DATA:  Left-sided neck pain for the past month  EXAM: CERVICAL SPINE - COMPLETE 4+ VIEW  COMPARISON:  None in PACs  FINDINGS: The cervical vertebral bodies are preserved in height. There is moderate disc space narrowing at C5-6 with mild narrowing at C6-7. There are anterior endplate osteophytes at C5-6. There is no perched facet. The spinous processes are intact. Positioning of the oblique views is limited. The prevertebral soft tissue spaces are normal. The odontoid is intact.  IMPRESSION: No compression fracture. Moderate degenerative disc space narrowing at C5-6 with milder narrowing at C6-7. Evaluation of the bony neural foramina is limited due to suboptimal positioning. If further evaluation is felt indicated clinically, cervical spine CT scanning would be a useful next imaging step.   Electronically Signed   By: David  Martinique M.D.   On: 04/05/2016 11:14  Assessment and Plan:   Neck pain on left side  DDD (degenerative disc disease), cervical   Reassuring exam.  I think it's  reasonable to continue some anti-inflammatories.  Recommendation was either for massage therapy or physical therapy based on out-of-pocket cost to the patient.  She called me back later and is going to do some massage therapy initially.  She will call me again if this is not successful.   Follow-up: No Follow-up on file.  Signed,  Maud Deed. Sequoya Hogsett, MD   Allergies as of 09/02/2016      Reactions   Codeine    REACTION: syncope   Sumatriptan    REACTION: choking sensation      Medication List       Accurate as of 09/02/16 11:59 PM. Always use your most recent med list.          aspirin 81 MG tablet Take 81 mg by mouth daily.   CALCIUM 500 PO Take 1 capsule by mouth daily.   cyclobenzaprine 10 MG tablet Commonly known as:  FLEXERIL Take 0.5-1 tablets (5-10 mg total) by mouth at bedtime as needed for muscle spasms.  docusate sodium 100 MG capsule Commonly known as:  COLACE Take 100 mg by mouth daily.   ferrous sulfate 325 (65 FE) MG tablet Take 325 mg by mouth daily.   fluticasone 50 MCG/ACT nasal spray Commonly known as:  FLONASE USE ONE SPRAY(S) IN EACH NOSTRIL ONCE DAILY AS DIRECTED   meloxicam 15 MG tablet Commonly known as:  MOBIC Take 1 tablet (15 mg total) by mouth daily. With food   ONE-A-DAY WOMENS 50 PLUS PO Take 1 tablet by mouth daily.   prednisoLONE acetate 1 % ophthalmic suspension Commonly known as:  PRED FORTE Place 1 drop into the left eye 3 (three) times daily.   SIMBRINZA 1-0.2 % Susp Generic drug:  Brinzolamide-Brimonidine Place 1 drop into the left eye daily.   Timolol Maleate 0.5 % (DAILY) Soln

## 2016-09-02 NOTE — Telephone Encounter (Signed)
ok 

## 2016-09-02 NOTE — Progress Notes (Signed)
Pre visit review using our clinic review tool, if applicable. No additional management support is needed unless otherwise documented below in the visit note. 

## 2016-09-02 NOTE — Telephone Encounter (Signed)
Pt called back she is going to do massage therapy first.  She will call back to get a referral for PT if this doesn't work

## 2016-12-03 ENCOUNTER — Telehealth: Payer: Self-pay | Admitting: Family Medicine

## 2016-12-03 DIAGNOSIS — Z0000 Encounter for general adult medical examination without abnormal findings: Secondary | ICD-10-CM

## 2016-12-03 NOTE — Telephone Encounter (Signed)
-----   Message from Ellamae Sia sent at 11/25/2016 12:31 PM EDT ----- Regarding: Lab orders for Thursday, 8.2.18 Patient is scheduled for CPX labs, please order future labs, Thanks , Karna Christmas

## 2016-12-05 ENCOUNTER — Other Ambulatory Visit (INDEPENDENT_AMBULATORY_CARE_PROVIDER_SITE_OTHER): Payer: BC Managed Care – PPO

## 2016-12-05 DIAGNOSIS — Z0000 Encounter for general adult medical examination without abnormal findings: Secondary | ICD-10-CM | POA: Diagnosis not present

## 2016-12-05 LAB — COMPREHENSIVE METABOLIC PANEL
ALK PHOS: 70 U/L (ref 39–117)
ALT: 12 U/L (ref 0–35)
AST: 18 U/L (ref 0–37)
Albumin: 4.2 g/dL (ref 3.5–5.2)
BUN: 25 mg/dL — AB (ref 6–23)
CHLORIDE: 106 meq/L (ref 96–112)
CO2: 29 mEq/L (ref 19–32)
CREATININE: 0.72 mg/dL (ref 0.40–1.20)
Calcium: 9.6 mg/dL (ref 8.4–10.5)
GFR: 86.92 mL/min (ref >60.00)
GLUCOSE: 89 mg/dL (ref 70–99)
POTASSIUM: 4 meq/L (ref 3.5–5.1)
SODIUM: 140 meq/L (ref 135–145)
TOTAL PROTEIN: 7.3 g/dL (ref 6.0–8.3)
Total Bilirubin: 0.4 mg/dL (ref 0.2–1.2)

## 2016-12-05 LAB — CBC WITH DIFFERENTIAL/PLATELET
Basophils Absolute: 0 10*3/uL (ref 0.0–0.1)
Basophils Relative: 0.3 % (ref 0.0–3.0)
EOS ABS: 0.2 10*3/uL (ref 0.0–0.7)
EOS PCT: 2.3 % (ref 0.0–5.0)
HCT: 37.9 % (ref 36.0–46.0)
Hemoglobin: 12.6 g/dL (ref 12.0–15.0)
LYMPHS ABS: 2 10*3/uL (ref 0.7–4.0)
Lymphocytes Relative: 29.7 % (ref 12.0–46.0)
MCHC: 33.2 g/dL (ref 30.0–36.0)
MCV: 94 fl (ref 78.0–100.0)
MONO ABS: 0.8 10*3/uL (ref 0.1–1.0)
Monocytes Relative: 11.7 % (ref 3.0–12.0)
NEUTROS PCT: 56 % (ref 43.0–77.0)
Neutro Abs: 3.8 10*3/uL (ref 1.4–7.7)
Platelets: 282 10*3/uL (ref 150.0–400.0)
RBC: 4.03 Mil/uL (ref 3.87–5.11)
RDW: 13.5 % (ref 11.5–15.5)
WBC: 6.7 10*3/uL (ref 4.0–10.5)

## 2016-12-05 LAB — LIPID PANEL
CHOLESTEROL: 226 mg/dL — AB (ref 0–200)
HDL: 69.6 mg/dL (ref >39.00)
LDL CALC: 148 mg/dL — AB (ref 0–99)
NONHDL: 156.66
Total CHOL/HDL Ratio: 3
Triglycerides: 42 mg/dL (ref 0.0–149.0)
VLDL: 8.4 mg/dL (ref 0.0–40.0)

## 2016-12-05 LAB — TSH: TSH: 2.22 u[IU]/mL (ref 0.35–4.50)

## 2016-12-10 ENCOUNTER — Encounter: Payer: Self-pay | Admitting: Family Medicine

## 2016-12-10 ENCOUNTER — Ambulatory Visit (INDEPENDENT_AMBULATORY_CARE_PROVIDER_SITE_OTHER): Payer: BC Managed Care – PPO | Admitting: Family Medicine

## 2016-12-10 VITALS — BP 132/66 | HR 75 | Temp 98.3°F | Ht 63.25 in | Wt 149.5 lb

## 2016-12-10 DIAGNOSIS — Z0000 Encounter for general adult medical examination without abnormal findings: Secondary | ICD-10-CM

## 2016-12-10 DIAGNOSIS — M542 Cervicalgia: Secondary | ICD-10-CM | POA: Diagnosis not present

## 2016-12-10 DIAGNOSIS — Z0001 Encounter for general adult medical examination with abnormal findings: Secondary | ICD-10-CM | POA: Diagnosis not present

## 2016-12-10 DIAGNOSIS — E785 Hyperlipidemia, unspecified: Secondary | ICD-10-CM | POA: Insufficient documentation

## 2016-12-10 DIAGNOSIS — E7800 Pure hypercholesterolemia, unspecified: Secondary | ICD-10-CM

## 2016-12-10 MED ORDER — FLUTICASONE PROPIONATE 50 MCG/ACT NA SUSP: NASAL | 3 refills | Status: DC

## 2016-12-10 MED ORDER — MELOXICAM 15 MG PO TABS: 15.0000 mg | ORAL_TABLET | Freq: Every day | ORAL | 3 refills | Status: DC

## 2016-12-10 NOTE — Assessment & Plan Note (Signed)
LDL is up into the 140s now  Good HDL ? Hereditary  She will cut back on egg yolks (otherwise good diet)  Disc goals for lipids and reasons to control them Rev labs with pt Rev low sat fat diet in detail  Consider statin if it worsens

## 2016-12-10 NOTE — Progress Notes (Signed)
Subjective:    Patient ID: Megan Harrison, female    DOB: 05-29-1955, 61 y.o.   MRN: [id number]  HPI Here for health maintenance exam and to review chronic medical problems    Feeling ok in general  Arthritis is more of a problem - some tasks are harder  Has a trigger finger (now it is two) She has seen Dr Lorelei Pont for her neck (improved but having some numbness and tingling going down arm)     Wt Readings from Last 3 Encounters:  12/10/16 149 lb 8 oz (67.8 kg)  09/02/16 160 lb 8 oz (72.8 kg)  04/05/16 148 lb (67.1 kg)   pilates/ gym/classes -doing well with exercise  Can no longer tolerate zumba  Weight is down nicely - weight watchers/back on track   26.27 kg/m  Pap 7/14 -neg Hysterectomy  No gyn symptoms   Mammogram 2/18 nl  Self breast exam-no lumps   Colonoscopy 4/15 with polyps 5 y recall Dr Carlean Purl Brother had colon cancer at 56   Tetanus shot 7/16 Gets flu shots each fall   zostavax 7/15  Results for orders placed or performed in visit on 12/05/16  CBC with Differential/Platelet  Result Value Ref Range   WBC 6.7 4.0 - 10.5 K/uL   RBC 4.03 3.87 - 5.11 Mil/uL   Hemoglobin 12.6 12.0 - 15.0 g/dL   HCT 37.9 36.0 - 46.0 %   MCV 94.0 78.0 - 100.0 fl   MCHC 33.2 30.0 - 36.0 g/dL   RDW 13.5 11.5 - 15.5 %   Platelets 282.0 150.0 - 400.0 K/uL   Neutrophils Relative % 56.0 43.0 - 77.0 %   Lymphocytes Relative 29.7 12.0 - 46.0 %   Monocytes Relative 11.7 3.0 - 12.0 %   Eosinophils Relative 2.3 0.0 - 5.0 %   Basophils Relative 0.3 0.0 - 3.0 %   Neutro Abs 3.8 1.4 - 7.7 K/uL   Lymphs Abs 2.0 0.7 - 4.0 K/uL   Monocytes Absolute 0.8 0.1 - 1.0 K/uL   Eosinophils Absolute 0.2 0.0 - 0.7 K/uL   Basophils Absolute 0.0 0.0 - 0.1 K/uL  Comprehensive metabolic panel  Result Value Ref Range   Sodium 140 135 - 145 mEq/L   Potassium 4.0 3.5 - 5.1 mEq/L   Chloride 106 96 - 112 mEq/L   CO2 29 19 - 32 mEq/L   Glucose, Bld 89 70 - 99 mg/dL   BUN 25 (H) 6 - 23 mg/dL   Creatinine, Ser 0.72 0.40 - 1.20 mg/dL   Total Bilirubin 0.4 0.2 - 1.2 mg/dL   Alkaline Phosphatase 70 39 - 117 U/L   AST 18 0 - 37 U/L   ALT 12 0 - 35 U/L   Total Protein 7.3 6.0 - 8.3 g/dL   Albumin 4.2 3.5 - 5.2 g/dL   Calcium 9.6 8.4 - 10.5 mg/dL   GFR 86.92 >60.00 mL/min  Lipid panel  Result Value Ref Range   Cholesterol 226 (H) 0 - 200 mg/dL   Triglycerides 42.0 0.0 - 149.0 mg/dL   HDL 69.60 >39.00 mg/dL   VLDL 8.4 0.0 - 40.0 mg/dL   LDL Cholesterol 148 (H) 0 - 99 mg/dL   Total CHOL/HDL Ratio 3    NonHDL 156.66   TSH  Result Value Ref Range   TSH 2.22 0.35 - 4.50 uIU/mL      LDL cholesterol is high  Lab Results  Component Value Date   CHOL 226 (H) 12/05/2016   CHOL  198 11/22/2015   CHOL 193 10/28/2014   Lab Results  Component Value Date   HDL 69.60 12/05/2016   HDL 65.90 11/22/2015   HDL 64.00 10/28/2014   Lab Results  Component Value Date   LDLCALC 148 (H) 12/05/2016   LDLCALC 121 (H) 11/22/2015   LDLCALC 114 (H) 10/28/2014   Lab Results  Component Value Date   TRIG 42.0 12/05/2016   TRIG 55.0 11/22/2015   TRIG 76.0 10/28/2014   Lab Results  Component Value Date   CHOLHDL 3 12/05/2016   CHOLHDL 3 11/22/2015   CHOLHDL 3 10/28/2014   Lab Results  Component Value Date   LDLDIRECT 102.9 06/10/2011  eating more eggs for protein  occ up to 3 per day  No fam hx of high cholesterol?   But mother had heart dz   No red meat or fried foods Only fat free cheeses  Avoids high cholesterol foods besides eggs   Patient Active Problem List   Diagnosis Date Noted  . Hyperlipidemia 12/10/2016  . Neck pain 04/05/2016  . Encounter for routine gynecological examination 11/04/2014  . Spinal stenosis of lumbar region 04/02/2013  . Hemorrhoids, internal 04/02/2013  . Routine general medical examination at a health care facility 06/09/2011  . ALLERGIC RHINITIS 03/10/2009  . History of shingles 08/28/2006  . MIGRAINES, HX OF 08/28/2006   Past Medical History:    Diagnosis Date  . Anemia   . Cutaneous sarcoidosis   . Degenerative disc disease   . Foot pain   . Migraine   . Zoster    Opthy (regular checks)   Past Surgical History:  Procedure Laterality Date  . 2D Echo  1999   Negative  . ABDOMINAL HYSTERECTOMY  11/99  . APPENDECTOMY    . Arm lesion, cutaneous sarcoid  12/2005   with normal CXR and ANA  . CESAREAN SECTION     two times  . COLONOSCOPY  11/0/2009   diverticulosis and hemorrhoids  . DIAGNOSTIC LAPAROSCOPY    . LAPAROSCOPIC APPENDECTOMY N/A 02/18/2016   Procedure: APPENDECTOMY LAPAROSCOPIC;  Surgeon: Ralene Ok, MD;  Location: [place];  Service: General;  Laterality: N/A;  . ROTATOR CUFF REPAIR    . TONSILLECTOMY    . UPPER GASTROINTESTINAL ENDOSCOPY  11/15/2010   non-erosive gastritis, H. pylori negative   Social History  Substance Use Topics  . Smoking status: Never Smoker  . Smokeless tobacco: Never Used  . Alcohol use No   Family History  Problem Relation Age of Onset  . Colon cancer Brother   . Heart disease Mother        CAD  . Alcohol abuse Mother   . Lung cancer Father   . Heart disease Brother        CAD  . Heart disease Brother        CAD, pre diabetic, obese  . Cancer Brother        Colon CA, liver mets at 77, DM   Allergies  Allergen Reactions  . Codeine     REACTION: syncope  . Sumatriptan     REACTION: choking sensation   Current Outpatient Prescriptions on File Prior to Visit  Medication Sig Dispense Refill  . aspirin 81 MG tablet Take 81 mg by mouth daily.     . Calcium Carbonate (CALCIUM 500 PO) Take 1 capsule by mouth daily.      Marland Kitchen docusate sodium (COLACE) 100 MG capsule Take 100 mg by mouth daily.    . ferrous  sulfate 325 (65 FE) MG tablet Take 325 mg by mouth daily.    . Multiple Vitamins-Minerals (ONE-A-DAY WOMENS 50 PLUS PO) Take 1 tablet by mouth daily.      . prednisoLONE acetate (PRED FORTE) 1 % ophthalmic suspension Place 1 drop into the left eye 3 (three) times daily.      . Timolol Maleate 0.5 % (DAILY) SOLN      No current facility-administered medications on file prior to visit.      Review of Systems Review of Systems  Constitutional: Negative for fever, appetite change, fatigue and unexpected weight change.  Eyes: Negative for pain and visual disturbance.  Respiratory: Negative for cough and shortness of breath.   Cardiovascular: Negative for cp or palpitations    Gastrointestinal: Negative for nausea, diarrhea and constipation.  Genitourinary: Negative for urgency and frequency.  Skin: Negative for pallor or rash   Neurological: Negative for weakness, light-headedness, numbness and headaches.  Hematological: Negative for adenopathy. Does not bruise/bleed easily.  Psychiatric/Behavioral: Negative for dysphoric mood. The patient is not nervous/anxious.         Objective:   Physical Exam  Constitutional: She appears well-developed and well-nourished. No distress.  Well appearing  Wt loss noted   HENT:  Head: Normocephalic and atraumatic.  Right Ear: External ear normal.  Left Ear: External ear normal.  Mouth/Throat: Oropharynx is clear and moist.  Eyes: Pupils are equal, round, and reactive to light. Conjunctivae and EOM are normal. No scleral icterus.  Neck: Normal range of motion. Neck supple. No JVD present. Carotid bruit is not present. No thyromegaly present.  Cardiovascular: Normal rate, regular rhythm, normal heart sounds and intact distal pulses.  Exam reveals no gallop.   Pulmonary/Chest: Effort normal and breath sounds normal. No respiratory distress. She has no wheezes. She exhibits no tenderness.  Abdominal: Soft. Bowel sounds are normal. She exhibits no distension, no abdominal bruit and no mass. There is no tenderness.  Genitourinary: No breast swelling, tenderness, discharge or bleeding.  Genitourinary Comments: Breast exam: No mass, nodules, thickening, tenderness, bulging, retraction, inflamation, nipple discharge or skin  changes noted.  No axillary or clavicular LA.      Musculoskeletal: Normal range of motion. She exhibits no edema or tenderness.  Lymphadenopathy:    She has no cervical adenopathy.  Neurological: She is alert. She has normal reflexes. No cranial nerve deficit. She exhibits normal muscle tone. Coordination normal.  Skin: Skin is warm and dry. No rash noted. No erythema. No pallor.  Tanned Solar lentigines diffusely with solar aging  Psychiatric: She has a normal mood and affect.  Cheerful and talkative           Assessment & Plan:   Problem List Items Addressed This Visit      Other   Hyperlipidemia    LDL is up into the 140s now  Good HDL ? Hereditary  She will cut back on egg yolks (otherwise good diet)  Disc goals for lipids and reasons to control them Rev labs with pt Rev low sat fat diet in detail  Consider statin if it worsens      Neck pain    Still ongoing on L with some poss radiculopathy symptoms in arm Will f/u with Dr Lorelei Pont      Routine general medical examination at a health care facility - Primary    Reviewed health habits including diet and exercise and skin cancer prevention Reviewed appropriate screening tests for age  Also reviewed health mt  list, fam hx and immunization status , as well as social and family history   See HPI Labs reviewed  Commended on great health habits and weight loss  May consider shingrix when it becomes available  Cholesterol is trending up  Nl cbc with iron  Will f/u with sport med re: neck and hand symptoms

## 2016-12-10 NOTE — Assessment & Plan Note (Signed)
Still ongoing on L with some poss radiculopathy symptoms in arm Will f/u with Dr Lorelei Pont

## 2016-12-10 NOTE — Patient Instructions (Addendum)
You may have hereditary high cholesterol - intensifying with age  61 a few eggs for egg whites to see if it helps  If not -we may need to discuss medicine in the future   Keep taking care of yourself with healthy diet and exercise

## 2016-12-10 NOTE — Assessment & Plan Note (Signed)
Reviewed health habits including diet and exercise and skin cancer prevention Reviewed appropriate screening tests for age  Also reviewed health mt list, fam hx and immunization status , as well as social and family history   See HPI Labs reviewed  Commended on great health habits and weight loss  May consider shingrix when it becomes available  Cholesterol is trending up  Nl cbc with iron  Will f/u with sport med re: neck and hand symptoms

## 2016-12-19 ENCOUNTER — Encounter: Payer: Self-pay | Admitting: Family Medicine

## 2016-12-19 ENCOUNTER — Ambulatory Visit (INDEPENDENT_AMBULATORY_CARE_PROVIDER_SITE_OTHER): Payer: BC Managed Care – PPO | Admitting: Family Medicine

## 2016-12-19 VITALS — BP 100/60 | HR 66 | Temp 98.3°F | Ht 63.0 in | Wt 149.1 lb

## 2016-12-19 DIAGNOSIS — M5412 Radiculopathy, cervical region: Secondary | ICD-10-CM | POA: Diagnosis not present

## 2016-12-19 DIAGNOSIS — M65331 Trigger finger, right middle finger: Secondary | ICD-10-CM | POA: Diagnosis not present

## 2016-12-19 DIAGNOSIS — M65341 Trigger finger, right ring finger: Secondary | ICD-10-CM | POA: Diagnosis not present

## 2016-12-19 MED ORDER — METHYLPREDNISOLONE ACETATE 40 MG/ML IJ SUSP
40.0000 mg | Freq: Once | INTRAMUSCULAR | Status: AC
  Administered 2016-12-19: 40 mg via INTRAMUSCULAR

## 2016-12-19 MED ORDER — AMITRIPTYLINE HCL 25 MG PO TABS: 25.0000 mg | ORAL_TABLET | Freq: Every day | ORAL | 3 refills | Status: DC

## 2016-12-19 NOTE — Progress Notes (Signed)
Dr. Frederico Hamman T. Linnet Bottari, MD, [facility] [place], [address] Phone: [phone] Fax: [phone]  12/19/2016  Patient: Megan Harrison, MRN: [id number], DOB: 20-Jul-1955, 61 y.o.  Primary Physician:  Tower, Wynelle Fanny, MD   Chief Complaint  Patient presents with  . Follow-up    Pt is still having pain on the left back side of her neck that extends down her shoulder and down her back, Pt still has Range of motion, She describes having a tingling and numbness sensation  . Hand Problem    Pt states she has some stiffniess in her right hand, on going for a while but has not subsided    Subjective:   Megan Harrison is a 61 y.o. very pleasant female patient who presents with the following:  F/u neck stiffness, approaching a year. I saw her for this about 4 months ago, at that point she was having minimal radicular symptoms. She went to the massage therapist, and she also has been doing some Pilates. This is helped quite a bit with the pain. She has had a progression of her symptoms and now she is having numbness and tingling in and about the shoulder and shoulder blade region as well as down into her arm, but this typically stops in the upper arm. She is not having any appreciable weakness.  Pain is doing better. Numbness and tingling posterior and in the shoulder blade on the left. Stops in upper arms.   Numbness and tingling now all the time. Feels cold, numb and tingling.   Has been doing some pilates and massage therapy.   Over the last few months, she has also developed some trigger finger on the the right third and fourth digits.  Past Medical History, Surgical History, Social History, Family History, Problem List, Medications, and Allergies have been reviewed and updated if relevant.  Patient Active Problem List   Diagnosis Date Noted  . Hyperlipidemia 12/10/2016  . Neck pain 04/05/2016  . Encounter for routine  gynecological examination 11/04/2014  . Spinal stenosis of lumbar region 04/02/2013  . Hemorrhoids, internal 04/02/2013  . Routine general medical examination at a health care facility 06/09/2011  . ALLERGIC RHINITIS 03/10/2009  . History of shingles 08/28/2006  . MIGRAINES, HX OF 08/28/2006    Past Medical History:  Diagnosis Date  . Anemia   . Cutaneous sarcoidosis   . Degenerative disc disease   . Foot pain   . Migraine   . Zoster    Opthy (regular checks)    Past Surgical History:  Procedure Laterality Date  . 2D Echo  1999   Negative  . ABDOMINAL HYSTERECTOMY  11/99  . APPENDECTOMY    . Arm lesion, cutaneous sarcoid  12/2005   with normal CXR and ANA  . CESAREAN SECTION     two times  . COLONOSCOPY  11/0/2009   diverticulosis and hemorrhoids  . DIAGNOSTIC LAPAROSCOPY    . LAPAROSCOPIC APPENDECTOMY N/A 02/18/2016   Procedure: APPENDECTOMY LAPAROSCOPIC;  Surgeon: Ralene Ok, MD;  Location: [facility];  Service: General;  Laterality: N/A;  . ROTATOR CUFF REPAIR    . TONSILLECTOMY    . UPPER GASTROINTESTINAL ENDOSCOPY  11/15/2010   non-erosive gastritis, H. pylori negative    Social History   Social History  . Marital status: Married    Spouse name: N/A  . Number of children: N/A  . Years of education: N/A   Occupational History  .  Works for [employer] Retired   Social History Main Topics  . Smoking status: Never Smoker  . Smokeless tobacco: Never Used  . Alcohol use No  . Drug use: No  . Sexual activity: Yes   Other Topics Concern  . Not on file   Social History Narrative   Regular exercise:  Curves, elliptical, walking.    Family History  Problem Relation Age of Onset  . Colon cancer Brother   . Heart disease Mother        CAD  . Alcohol abuse Mother   . Lung cancer Father   . Heart disease Brother        CAD  . Heart disease Brother        CAD, pre diabetic, obese  . Cancer Brother        Colon CA, liver mets at 3, DM     Allergies  Allergen Reactions  . Codeine     REACTION: syncope  . Sumatriptan     REACTION: choking sensation    Medication list reviewed and updated in full in [facility].  GEN: no acute illness or fever CV: No chest pain or shortness of breath MSK: detailed above Neuro: neurological signs are described above ROS O/w per HPI  Objective:   BP 100/60 (BP Location: Right Arm, Patient Position: Sitting, Cuff Size: Normal)   Pulse 66   Temp 98.3 F (36.8 C) (Oral)   Ht 5\' 3"  (1.6 m)   Wt 149 lb 1.6 oz (67.6 kg)   SpO2 97%   BMI 26.41 kg/m    GEN: Well-developed,well-nourished,in no acute distress; alert,appropriate and cooperative throughout examination HEENT: Normocephalic and atraumatic without obvious abnormalities. Ears, externally no deformities PULM: Breathing comfortably in no respiratory distress EXT: No clubbing, cyanosis, or edema PSYCH: Normally interactive. Cooperative during the interview. Pleasant. Friendly and conversant. Not anxious or depressed appearing. Normal, full affect.  CERVICAL SPINE EXAM Range of motion: Flexion, extension, lateral bending, and rotation: 10% loss of motion Pain with terminal motion: minimal Spinous Processes: NT SCM: NT Upper paracervical muscles: NT Upper traps: NT C5-T1 intact, sensation and motor - generalized tingling  R hand Ecchymosis or edema: neg ROM wrist/hand/digits: full  Carpals, MCP's, digits: NT Distal Ulna and Radius: NT Ecchymosis or edema: neg No instability Cysts/nodules: 3 and 4 Digit triggering: 3, 4 Finkelstein's test: neg Snuffbox tenderness: neg Scaphoid tubercle: NT Resisted supination: NT Full composite fist, no malrotation Grip, all digits: 5/5 str DIPJT: NT PIP JT: NT MCP JT: NT No tenosynovitis Axial load test: neg Phalen's: neg Tinel's: neg Atrophy: neg  Hand sensation: intact   Radiology:  Assessment and Plan:   Cervical radiculopathy, chronic - Plan: Ambulatory  referral to Physical Therapy  Trigger finger, right middle finger - Plan: methylPREDNISolone acetate (DEPO-MEDROL) injection 40 mg, methylPREDNISolone acetate (DEPO-MEDROL) injection 40 mg  Trigger finger, right ring finger  For the neck, where again and try to do some physical therapy with some traction to see if this will help with her radicular symptoms and had some neuropathic pain medication.  Using an anatomical model, I reviewed with the patent the structures involved and how they related to their diagnosis .  We discussed the pathophysiology of trigger fingers. Discussed the inflammatory nature of nodule creation and likely nodule abutting the A1 pulley system, this causing the patient's discomfort and sensations. We discussed that treatments for this include direct injection into the tendon sheath to attempt to shrink catching tissue. This can  be done 1-2 times. Other treatments include surgical release. If the patient fails to trigger finger injections, I would recommend trigger finger release if the patient desires relief of the symptoms.   Trigger Finger Injection, R 3rd Verbal consent was obtained. Risks (including rare risk of infection, potential risk for skin lightening and potential atrophy), benefits and alternatives were discussed. Prepped with Chloraprep and Ethyl Chloride used for anesthesia. Under sterile conditions, patient injected at palmar crease aiming distally with 45 degree angle towards nodule; injected directly into tendon sheath. Medication flowed freely without resistance.  Needle size: 22 gauge 1 1/2 inch Injection: 1/2 cc of Lidocaine 1% and Depo-Medrol 20 mg   Trigger Finger Injection, R 4th Verbal consent was obtained. Risks (including rare risk of infection, potential risk for skin lightening and potential atrophy), benefits and alternatives were discussed. Prepped with Chloraprep and Ethyl Chloride used for anesthesia. Under sterile conditions, patient  injected at palmar crease aiming distally with 45 degree angle towards nodule; injected directly into tendon sheath. Medication flowed freely without resistance.  Needle size: 22 gauge 1 1/2 inch Injection: 1/2 cc of Lidocaine 1% and Depo-Medrol 20 mg   Patient did get slightly vagal post procedure, but we had her lie down in a Trendelenburg position, and she recovered promptly.  Follow-up: Return in about 6 weeks (around 01/30/2017).  New Prescriptions   AMITRIPTYLINE (ELAVIL) 25 MG TABLET    Take 1 tablet (25 mg total) by mouth at bedtime.    Orders Placed This Encounter  Procedures  . Ambulatory referral to Physical Therapy    Signed,  Frederico Hamman T. Maxwell Lemen, MD   Allergies as of 12/19/2016      Reactions   Codeine    REACTION: syncope   Sumatriptan    REACTION: choking sensation      Medication List       Accurate as of 12/19/16  1:41 PM. Always use your most recent med list.          amitriptyline 25 MG tablet Commonly known as:  ELAVIL Take 1 tablet (25 mg total) by mouth at bedtime.   aspirin 81 MG tablet Take 81 mg by mouth daily.   CALCIUM 500 PO Take 1 capsule by mouth daily.   docusate sodium 100 MG capsule Commonly known as:  COLACE Take 100 mg by mouth daily.   ferrous sulfate 325 (65 FE) MG tablet Take 325 mg by mouth daily.   fluticasone 50 MCG/ACT nasal spray Commonly known as:  FLONASE USE ONE SPRAY(S) IN EACH NOSTRIL ONCE DAILY AS DIRECTED   meloxicam 15 MG tablet Commonly known as:  MOBIC Take 1 tablet (15 mg total) by mouth daily. With food   ONE-A-DAY WOMENS 50 PLUS PO Take 1 tablet by mouth daily.   prednisoLONE acetate 1 % ophthalmic suspension Commonly known as:  PRED FORTE Place 1 drop into the left eye 3 (three) times daily.   Timolol Maleate 0.5 % (DAILY) Soln

## 2017-01-29 ENCOUNTER — Encounter: Payer: Self-pay | Admitting: Family Medicine

## 2017-01-29 ENCOUNTER — Ambulatory Visit (INDEPENDENT_AMBULATORY_CARE_PROVIDER_SITE_OTHER): Payer: BC Managed Care – PPO | Admitting: Family Medicine

## 2017-01-29 VITALS — BP 116/60 | HR 86 | Temp 98.3°F | Ht 63.0 in | Wt 157.2 lb

## 2017-01-29 DIAGNOSIS — M5412 Radiculopathy, cervical region: Secondary | ICD-10-CM | POA: Diagnosis not present

## 2017-01-29 DIAGNOSIS — M542 Cervicalgia: Secondary | ICD-10-CM | POA: Diagnosis not present

## 2017-01-29 DIAGNOSIS — R202 Paresthesia of skin: Secondary | ICD-10-CM

## 2017-01-29 DIAGNOSIS — R200 Anesthesia of skin: Secondary | ICD-10-CM | POA: Diagnosis not present

## 2017-01-29 NOTE — Patient Instructions (Signed)

## 2017-01-29 NOTE — Progress Notes (Signed)
Dr. Frederico Hamman T. Jakhari Space, MD, [facility] [place], [address] Phone: [phone] Fax: [phone]  01/29/2017  Patient: Megan Harrison, MRN: [id number], DOB: [birth year], 61 y.o.  Primary Physician:  Tower, Wynelle Fanny, MD   Chief Complaint  Patient presents with  . Follow-up    Neck/Shoulder Pain-Not any better   Subjective:   Megan Harrison is a 61 y.o. very pleasant female patient who presents with the following:  F/u neck and shoulder blade pain: the patient has had ongoing neck pain for greater than a year.  She has had fairly extensive treatment with conservative management including physical therapy, traction, massage therapy, Pilates, as well as neuropathic pain agents, steroids, and continues to have significant pain and pain down into her arms.  She also has some tingling and numbness occasionally in the shoulder blade region as well as down into her arm.  She is not having any weakness.  Mostly impairing and annoying like waking up from sleep.   Took a tumble in the ocean in [place] in the lower back. Has three lower disc that are bulging.   Past Medical History, Surgical History, Social History, Family History, Problem List, Medications, and Allergies have been reviewed and updated if relevant.  Patient Active Problem List   Diagnosis Date Noted  . Hyperlipidemia 12/10/2016  . Neck pain 04/05/2016  . Encounter for routine gynecological examination 11/04/2014  . Spinal stenosis of lumbar region 04/02/2013  . Hemorrhoids, internal 04/02/2013  . Routine general medical examination at a health care facility 06/09/2011  . ALLERGIC RHINITIS 03/10/2009  . History of shingles 08/28/2006    Past Medical History:  Diagnosis Date  . Anemia   . Cutaneous sarcoidosis   . Degenerative disc disease   . Foot pain   . Migraine   . Zoster    Opthy (regular checks)    Past Surgical History:  Procedure Laterality  Date  . 2D Echo  1999   Negative  . ABDOMINAL HYSTERECTOMY  11/99  . APPENDECTOMY    . Arm lesion, cutaneous sarcoid  12/2005   with normal CXR and ANA  . CESAREAN SECTION     two times  . COLONOSCOPY  11/0/2009   diverticulosis and hemorrhoids  . DIAGNOSTIC LAPAROSCOPY    . LAPAROSCOPIC APPENDECTOMY N/A 02/18/2016   Procedure: APPENDECTOMY LAPAROSCOPIC;  Surgeon: Ralene Ok, MD;  Location: [facility];  Service: General;  Laterality: N/A;  . ROTATOR CUFF REPAIR    . TONSILLECTOMY    . UPPER GASTROINTESTINAL ENDOSCOPY  11/15/2010   non-erosive gastritis, H. pylori negative    Social History   Social History  . Marital status: Married    Spouse name: N/A  . Number of children: N/A  . Years of education: N/A   Occupational History  . Works for [employer] Retired   Social History Main Topics  . Smoking status: Never Smoker  . Smokeless tobacco: Never Used  . Alcohol use No  . Drug use: No  . Sexual activity: Yes   Other Topics Concern  . Not on file   Social History Narrative   Regular exercise:  Curves, elliptical, walking.    Family History  Problem Relation Age of Onset  . Colon cancer Brother   . Heart disease Mother        CAD  . Alcohol abuse Mother   . Lung cancer Father   . Heart disease Brother  CAD  . Heart disease Brother        CAD, pre diabetic, obese  . Cancer Brother        Colon CA, liver mets at 53, DM    Allergies  Allergen Reactions  . Codeine     REACTION: syncope  . Sumatriptan     REACTION: choking sensation    Medication list reviewed and updated in full in [facility].  GEN: No fevers, chills. Nontoxic. Primarily MSK c/o today. MSK: Detailed in the HPI GI: tolerating PO intake without difficulty Neuro: No numbness, parasthesias, or tingling associated. Otherwise the pertinent positives of the ROS are noted above.   Objective:   BP 116/60   Pulse 86   Temp 98.3 F (36.8 C) (Oral)   Ht 5\' 3"  (1.6 m)    Wt 157 lb 4 oz (71.3 kg)   BMI 27.86 kg/m    GEN: Well-developed,well-nourished,in no acute distress; alert,appropriate and cooperative throughout examination HEENT: Normocephalic and atraumatic without obvious abnormalities. Ears, externally no deformities PULM: Breathing comfortably in no respiratory distress EXT: No clubbing, cyanosis, or edema PSYCH: Normally interactive. Cooperative during the interview. Pleasant. Friendly and conversant. Not anxious or depressed appearing. Normal, full affect.  CERVICAL SPINE EXAM Range of motion: Flexion, extension, lateral bending, and rotation: approximate 10% loss of terminal motion in all directions. Pain with terminal motion: minimal to mild. Spinous Processes: NT SCM: NT Upper paracervical muscles: nt Upper traps: NT C5-T1 intact, sensation and motor intact with some generalized tingling  Shoulder: b Inspection: No muscle wasting or winging Ecchymosis/edema: neg  AC joint, scapula, clavicle: NT Abduction: full, 5/5 Flexion: full, 5/5 IR, full, lift-off: 5/5 ER at neutral: full, 5/5 AC crossover and compression: neg Neer: neg Hawkins: neg Drop Test: neg Empty Can: neg Supraspinatus insertion: NT Bicipital groove: NT Speed's: neg Yergason's: neg Sulcus sign: neg Scapular dyskinesis: none Grip 5/5   Radiology: CLINICAL DATA:  Left-sided neck pain for the past month  EXAM: CERVICAL SPINE - COMPLETE 4+ VIEW  COMPARISON:  None in PACs  FINDINGS: The cervical vertebral bodies are preserved in height. There is moderate disc space narrowing at C5-6 with mild narrowing at C6-7. There are anterior endplate osteophytes at C5-6. There is no perched facet. The spinous processes are intact. Positioning of the oblique views is limited. The prevertebral soft tissue spaces are normal. The odontoid is intact.  IMPRESSION: No compression fracture. Moderate degenerative disc space narrowing at C5-6 with milder narrowing at C6-7.  Evaluation of the bony neural foramina is limited due to suboptimal positioning. If further evaluation is felt indicated clinically, cervical spine CT scanning would be a useful next imaging step.   Electronically Signed   By: David  Martinique M.D.   On: 04/05/2016 11:14  Assessment and Plan:   Left cervical radiculopathy - Plan: MR Cervical Spine Wo Contrast, Ambulatory referral to Physical Medicine Rehab  Numbness and tingling in left arm - Plan: MR Cervical Spine Wo Contrast, Ambulatory referral to Physical Medicine Rehab  Cervicalgia - Plan: MR Cervical Spine Wo Contrast, Ambulatory referral to Physical Medicine Rehab  Failure to improve greater than one year with extensive conservative management including physical therapy, traction, massage therapy, Pilates, neuropathic pain agents, steroids, NSAIDs, and multiple other modalities.  Obtain an MRI of the cervical spine without contrast to evaluate for foraminal nerve encroachment, spinal stenosis, or other derangement.  We will arrange for her to follow-up with Dr. Ron Agee, who she has seen before.   Follow-up:  No Follow-up on file.  Future Appointments Date Time Provider [place]  02/05/2017 2:00 PM [facility]   Orders Placed This Encounter  Procedures  . MR Cervical Spine Wo Contrast  . Ambulatory referral to Physical Medicine Rehab    Signed,  Frederico Hamman T. Benjaman Artman, MD   Patient's Medications  New Prescriptions   No medications on file  Previous Medications   AMITRIPTYLINE (ELAVIL) 25 MG TABLET    Take 1 tablet (25 mg total) by mouth at bedtime.   ASPIRIN 81 MG TABLET    Take 81 mg by mouth daily.    CALCIUM CARBONATE (CALCIUM 500 PO)    Take 1 capsule by mouth daily.     DOCUSATE SODIUM (COLACE) 100 MG CAPSULE    Take 100 mg by mouth daily.   FERROUS SULFATE 325 (65 FE) MG TABLET    Take 325 mg by mouth daily.   FLUTICASONE (FLONASE) 50 MCG/ACT NASAL SPRAY    USE ONE SPRAY(S) IN EACH NOSTRIL ONCE  DAILY AS DIRECTED   MELOXICAM (MOBIC) 15 MG TABLET    Take 1 tablet (15 mg total) by mouth daily. With food   MULTIPLE VITAMINS-MINERALS (ONE-A-DAY WOMENS 50 PLUS PO)    Take 1 tablet by mouth daily.     PREDNISOLONE ACETATE (PRED FORTE) 1 % OPHTHALMIC SUSPENSION    Place 1 drop into the left eye 3 (three) times daily.    TIMOLOL MALEATE 0.5 % (DAILY) SOLN      Modified Medications   No medications on file  Discontinued Medications   No medications on file

## 2017-02-05 ENCOUNTER — Ambulatory Visit
Admission: RE | Admit: 2017-02-05 | Discharge: 2017-02-05 | Disposition: A | Discharge status: Home / Self Care | Payer: BC Managed Care – PPO | Source: Ambulatory Visit | Attending: Family Medicine | Admitting: Family Medicine

## 2017-02-05 DIAGNOSIS — M5021 Other cervical disc displacement,  high cervical region: Secondary | ICD-10-CM | POA: Insufficient documentation

## 2017-02-05 DIAGNOSIS — M4802 Spinal stenosis, cervical region: Secondary | ICD-10-CM | POA: Diagnosis not present

## 2017-02-05 DIAGNOSIS — R202 Paresthesia of skin: Secondary | ICD-10-CM

## 2017-02-05 DIAGNOSIS — R200 Anesthesia of skin: Secondary | ICD-10-CM | POA: Diagnosis present

## 2017-02-05 DIAGNOSIS — M542 Cervicalgia: Secondary | ICD-10-CM | POA: Diagnosis present

## 2017-02-05 DIAGNOSIS — M5412 Radiculopathy, cervical region: Secondary | ICD-10-CM

## 2017-02-05 DIAGNOSIS — M1288 Other specific arthropathies, not elsewhere classified, other specified site: Secondary | ICD-10-CM | POA: Diagnosis not present

## 2017-05-16 ENCOUNTER — Telehealth: Payer: Self-pay | Admitting: Family Medicine

## 2017-05-16 DIAGNOSIS — Z1231 Encounter for screening mammogram for malignant neoplasm of breast: Secondary | ICD-10-CM

## 2017-05-16 NOTE — Telephone Encounter (Signed)
Copied from [facility] [phone] [id number]. Topic: Quick Communication - See Telephone Encounter >> May 16, 2017  1:39 PM Oneta Rack wrote: CRM for notification. See Telephone encounter for:   05/16/17.  Relation to [id number]  Call back number: [phone]    Reason for call:  Patient requesting annual mamo orders placed with [facility] location instead of [facility] location. Please advise

## 2017-05-16 NOTE — Telephone Encounter (Signed)
[facility]?

## 2017-05-19 DIAGNOSIS — Z1231 Encounter for screening mammogram for malignant neoplasm of breast: Secondary | ICD-10-CM | POA: Insufficient documentation

## 2017-05-19 NOTE — Telephone Encounter (Signed)
Order done Will route to [facility] 

## 2017-05-19 NOTE — Telephone Encounter (Signed)
Called the patient and gave her the [facility] phone number.

## 2017-05-19 NOTE — Telephone Encounter (Signed)
yes

## 2017-06-11 ENCOUNTER — Ambulatory Visit
Admission: RE | Admit: 2017-06-11 | Discharge: 2017-06-11 | Disposition: A | Discharge status: Home / Self Care | Payer: BC Managed Care – PPO | Source: Ambulatory Visit | Attending: Family Medicine | Admitting: Family Medicine

## 2017-06-11 DIAGNOSIS — Z1231 Encounter for screening mammogram for malignant neoplasm of breast: Secondary | ICD-10-CM

## 2017-10-04 HISTORY — PX: LUMBAR LAMINECTOMY: SHX95

## 2017-11-19 DIAGNOSIS — D239 Other benign neoplasm of skin, unspecified: Secondary | ICD-10-CM

## 2017-11-19 HISTORY — DX: Other benign neoplasm of skin, unspecified: D23.9

## 2018-01-04 ENCOUNTER — Telehealth: Payer: Self-pay | Admitting: Family Medicine

## 2018-01-04 DIAGNOSIS — E7800 Pure hypercholesterolemia, unspecified: Secondary | ICD-10-CM

## 2018-01-04 DIAGNOSIS — Z0000 Encounter for general adult medical examination without abnormal findings: Secondary | ICD-10-CM

## 2018-01-04 NOTE — Telephone Encounter (Signed)
-----   Message from Ellamae Sia sent at 12/29/2017  4:09 PM EDT ----- Regarding: Lab orders for Tuesday, 01-06-18 Patient is scheduled for CPX labs, please order future labs, Thanks , Karna Christmas

## 2018-01-06 ENCOUNTER — Other Ambulatory Visit (INDEPENDENT_AMBULATORY_CARE_PROVIDER_SITE_OTHER): Payer: BC Managed Care – PPO

## 2018-01-06 DIAGNOSIS — Z0000 Encounter for general adult medical examination without abnormal findings: Secondary | ICD-10-CM

## 2018-01-06 DIAGNOSIS — E7800 Pure hypercholesterolemia, unspecified: Secondary | ICD-10-CM

## 2018-01-06 LAB — COMPREHENSIVE METABOLIC PANEL
ALBUMIN: 4.2 g/dL (ref 3.5–5.2)
ALT: 10 U/L (ref 0–35)
AST: 16 U/L (ref 0–37)
Alkaline Phosphatase: 66 U/L (ref 39–117)
BUN: 22 mg/dL (ref 6–23)
CHLORIDE: 105 meq/L (ref 96–112)
CO2: 28 mEq/L (ref 19–32)
Calcium: 9.7 mg/dL (ref 8.4–10.5)
Creatinine, Ser: 0.76 mg/dL (ref 0.40–1.20)
GFR: 81.38 mL/min (ref >60.00)
Glucose, Bld: 95 mg/dL (ref 70–99)
POTASSIUM: 4.2 meq/L (ref 3.5–5.1)
SODIUM: 139 meq/L (ref 135–145)
Total Bilirubin: 0.3 mg/dL (ref 0.2–1.2)
Total Protein: 7.3 g/dL (ref 6.0–8.3)

## 2018-01-06 LAB — CBC WITH DIFFERENTIAL/PLATELET
Basophils Absolute: 0 10*3/uL (ref 0.0–0.1)
Basophils Relative: 0.6 % (ref 0.0–3.0)
EOS PCT: 2.1 % (ref 0.0–5.0)
Eosinophils Absolute: 0.1 10*3/uL (ref 0.0–0.7)
HCT: 37.8 % (ref 36.0–46.0)
HEMOGLOBIN: 12.7 g/dL (ref 12.0–15.0)
Lymphocytes Relative: 30.6 % (ref 12.0–46.0)
Lymphs Abs: 2 10*3/uL (ref 0.7–4.0)
MCHC: 33.6 g/dL (ref 30.0–36.0)
MCV: 92.7 fl (ref 78.0–100.0)
MONO ABS: 0.8 10*3/uL (ref 0.1–1.0)
MONOS PCT: 12.8 % — AB (ref 3.0–12.0)
Neutro Abs: 3.5 10*3/uL (ref 1.4–7.7)
Neutrophils Relative %: 53.9 % (ref 43.0–77.0)
Platelets: 316 10*3/uL (ref 150.0–400.0)
RBC: 4.08 Mil/uL (ref 3.87–5.11)
RDW: 13.3 % (ref 11.5–15.5)
WBC: 6.6 10*3/uL (ref 4.0–10.5)

## 2018-01-06 LAB — LIPID PANEL
CHOLESTEROL: 231 mg/dL — AB (ref 0–200)
HDL: 66.3 mg/dL (ref >39.00)
LDL CALC: 153 mg/dL — AB (ref 0–99)
NonHDL: 164.31
Total CHOL/HDL Ratio: 3
Triglycerides: 59 mg/dL (ref 0.0–149.0)
VLDL: 11.8 mg/dL (ref 0.0–40.0)

## 2018-01-06 LAB — TSH: TSH: 2.25 u[IU]/mL (ref 0.35–4.50)

## 2018-01-12 ENCOUNTER — Encounter: Payer: Self-pay | Admitting: Family Medicine

## 2018-01-12 ENCOUNTER — Ambulatory Visit (INDEPENDENT_AMBULATORY_CARE_PROVIDER_SITE_OTHER): Payer: BC Managed Care – PPO | Admitting: Family Medicine

## 2018-01-12 VITALS — BP 118/76 | HR 68 | Temp 98.3°F | Ht 63.25 in | Wt 159.0 lb

## 2018-01-12 DIAGNOSIS — E7800 Pure hypercholesterolemia, unspecified: Secondary | ICD-10-CM | POA: Diagnosis not present

## 2018-01-12 DIAGNOSIS — Z0000 Encounter for general adult medical examination without abnormal findings: Secondary | ICD-10-CM | POA: Diagnosis not present

## 2018-01-12 MED ORDER — ATORVASTATIN CALCIUM 10 MG PO TABS: 10.0000 mg | ORAL_TABLET | Freq: Every day | ORAL | 11 refills | Status: DC

## 2018-01-12 MED ORDER — FLUTICASONE PROPIONATE 50 MCG/ACT NA SUSP: NASAL | 3 refills | Status: DC

## 2018-01-12 NOTE — Patient Instructions (Addendum)
Your colonoscopy is due in April   If you are interested in the new shingles vaccine (Shingrix) - call your local pharmacy to check on coverage and availability  If affordable- get on a wait list at your pharmacy   Start atorvastatin 10 mg daily in the evening  If any side effects - alert me (like muscle pain)   Avoid red meat/ fried foods/ egg yolks/ fatty breakfast meats/ butter, cheese and high fat dairy/ and shellfish    Take care of yourself

## 2018-01-12 NOTE — Progress Notes (Signed)
Subjective:    Patient ID: Megan Harrison, female    DOB: Jul 20, 1955, 62 y.o.   MRN: [id number]  HPI  Here for health maintenance exam and to review chronic medical problems    She had back surgery in June and it went very well  Laminectomy - much improved !   Wt Readings from Last 3 Encounters:  01/12/18 159 lb (72.1 kg)  01/29/17 157 lb 4 oz (71.3 kg)  12/19/16 149 lb 1.6 oz (67.6 kg)  did not exercise for a while - then back surgery in June - was able to loose the first 10 lb so far  Walking PT exercises  Back on weight watchers  27.94 kg/m   Pap 7/14-neg gyn Had a total hysterectomy  No gyn symptoms  Does not go anymore - gyn retired   Flu shot -had at [facility]  Colonoscopy 4/15 with 5 y recall  Brother died of colon cancer at 22 Will be due in April    Mammogram 2/19 -neg Self breast exam - no lumps or changes   Tetanus shot 7/16  zostavax 7/15 Has had shingles Interested in shingrix   Hyperlipidemia  Lab Results  Component Value Date   CHOL 231 (H) 01/06/2018   CHOL 226 (H) 12/05/2016   CHOL 198 11/22/2015   Lab Results  Component Value Date   HDL 66.30 01/06/2018   HDL 69.60 12/05/2016   HDL 65.90 11/22/2015   Lab Results  Component Value Date   LDLCALC 153 (H) 01/06/2018   LDLCALC 148 (H) 12/05/2016   LDLCALC 121 (H) 11/22/2015   Lab Results  Component Value Date   TRIG 59.0 01/06/2018   TRIG 42.0 12/05/2016   TRIG 55.0 11/22/2015   Lab Results  Component Value Date   CHOLHDL 3 01/06/2018   CHOLHDL 3 12/05/2016   CHOLHDL 3 11/22/2015   Lab Results  Component Value Date   LDLDIRECT 102.9 06/10/2011    Is eating well - mother had young heart dz - smoker and etoh as well   Other labs Results for orders placed or performed in visit on 01/06/18  TSH  Result Value Ref Range   TSH 2.25 0.35 - 4.50 uIU/mL  Lipid panel  Result Value Ref Range   Cholesterol 231 (H) 0 - 200 mg/dL   Triglycerides 59.0 0.0 - 149.0 mg/dL   HDL 66.30  >39.00 mg/dL   VLDL 11.8 0.0 - 40.0 mg/dL   LDL Cholesterol 153 (H) 0 - 99 mg/dL   Total CHOL/HDL Ratio 3    NonHDL 164.31   Comprehensive metabolic panel  Result Value Ref Range   Sodium 139 135 - 145 mEq/L   Potassium 4.2 3.5 - 5.1 mEq/L   Chloride 105 96 - 112 mEq/L   CO2 28 19 - 32 mEq/L   Glucose, Bld 95 70 - 99 mg/dL   BUN 22 6 - 23 mg/dL   Creatinine, Ser 0.76 0.40 - 1.20 mg/dL   Total Bilirubin 0.3 0.2 - 1.2 mg/dL   Alkaline Phosphatase 66 39 - 117 U/L   AST 16 0 - 37 U/L   ALT 10 0 - 35 U/L   Total Protein 7.3 6.0 - 8.3 g/dL   Albumin 4.2 3.5 - 5.2 g/dL   Calcium 9.7 8.4 - 10.5 mg/dL   GFR 81.38 >60.00 mL/min  CBC with Differential/Platelet  Result Value Ref Range   WBC 6.6 4.0 - 10.5 K/uL   RBC 4.08 3.87 - 5.11 Mil/uL  Hemoglobin 12.7 12.0 - 15.0 g/dL   HCT 37.8 36.0 - 46.0 %   MCV 92.7 78.0 - 100.0 fl   MCHC 33.6 30.0 - 36.0 g/dL   RDW 13.3 11.5 - 15.5 %   Platelets 316.0 150.0 - 400.0 K/uL   Neutrophils Relative % 53.9 43.0 - 77.0 %   Lymphocytes Relative 30.6 12.0 - 46.0 %   Monocytes Relative 12.8 (H) 3.0 - 12.0 %   Eosinophils Relative 2.1 0.0 - 5.0 %   Basophils Relative 0.6 0.0 - 3.0 %   Neutro Abs 3.5 1.4 - 7.7 K/uL   Lymphs Abs 2.0 0.7 - 4.0 K/uL   Monocytes Absolute 0.8 0.1 - 1.0 K/uL   Eosinophils Absolute 0.1 0.0 - 0.7 K/uL   Basophils Absolute 0.0 0.0 - 0.1 K/uL    Patient Active Problem List   Diagnosis Date Noted  . Encounter for screening mammogram for breast cancer 05/19/2017  . Hyperlipidemia 12/10/2016  . Neck pain 04/05/2016  . Encounter for routine gynecological examination 11/04/2014  . Hemorrhoids, internal 04/02/2013  . Routine general medical examination at a health care facility 06/09/2011  . ALLERGIC RHINITIS 03/10/2009  . History of shingles 08/28/2006   Past Medical History:  Diagnosis Date  . Anemia   . Cutaneous sarcoidosis   . Degenerative disc disease   . Foot pain   . Migraine   . Zoster    Opthy (regular  checks)   Past Surgical History:  Procedure Laterality Date  . 2D Echo  1999   Negative  . ABDOMINAL HYSTERECTOMY  11/99  . APPENDECTOMY    . Arm lesion, cutaneous sarcoid  12/2005   with normal CXR and ANA  . CESAREAN SECTION     two times  . COLONOSCOPY  11/0/2009   diverticulosis and hemorrhoids  . DIAGNOSTIC LAPAROSCOPY    . LAPAROSCOPIC APPENDECTOMY N/A 02/18/2016   Procedure: APPENDECTOMY LAPAROSCOPIC;  Surgeon: Ralene Ok, MD;  Location: [facility];  Service: General;  Laterality: N/A;  . ROTATOR CUFF REPAIR    . TONSILLECTOMY    . UPPER GASTROINTESTINAL ENDOSCOPY  11/15/2010   non-erosive gastritis, H. pylori negative   Social History   Tobacco Use  . Smoking status: Never Smoker  . Smokeless tobacco: Never Used  Substance Use Topics  . Alcohol use: No    Alcohol/week: 0.0 standard drinks  . Drug use: No   Family History  Problem Relation Age of Onset  . Colon cancer Brother   . Heart disease Mother        CAD  . Alcohol abuse Mother   . Lung cancer Father   . Heart disease Brother        CAD  . Heart disease Brother        CAD, pre diabetic, obese  . Cancer Brother        Colon CA, liver mets at 40, DM  . Breast cancer Neg Hx    Allergies  Allergen Reactions  . Codeine     REACTION: syncope  . Sumatriptan     REACTION: choking sensation   Current Outpatient Medications on File Prior to Visit  Medication Sig Dispense Refill  . aspirin 81 MG tablet Take 81 mg by mouth daily.     . Calcium Carbonate (CALCIUM 500 PO) Take 1 capsule by mouth daily.      . celecoxib (CELEBREX) 100 MG capsule Take 100 mg by mouth 2 (two) times daily.    Marland Kitchen docusate sodium (COLACE)  100 MG capsule Take 100 mg by mouth daily.    . ferrous sulfate 325 (65 FE) MG tablet Take 325 mg by mouth daily.    . Multiple Vitamins-Minerals (ONE-A-DAY WOMENS 50 PLUS PO) Take 1 tablet by mouth daily.      . prednisoLONE acetate (PRED FORTE) 1 % ophthalmic suspension Place 1 drop into the  left eye 3 (three) times daily.     . Timolol Maleate 0.5 % (DAILY) SOLN      No current facility-administered medications on file prior to visit.      Review of Systems  Constitutional: Negative for activity change, appetite change, fatigue, fever and unexpected weight change.  HENT: Negative for congestion, ear pain, rhinorrhea, sinus pressure and sore throat.   Eyes: Negative for pain, redness and visual disturbance.  Respiratory: Negative for cough, shortness of breath and wheezing.   Cardiovascular: Negative for chest pain and palpitations.  Gastrointestinal: Negative for abdominal pain, blood in stool, constipation and diarrhea.  Endocrine: Negative for polydipsia and polyuria.  Genitourinary: Negative for dysuria, frequency and urgency.  Musculoskeletal: Negative for arthralgias, back pain and myalgias.  Skin: Negative for pallor and rash.  Allergic/Immunologic: Negative for environmental allergies.  Neurological: Negative for dizziness, syncope and headaches.  Hematological: Negative for adenopathy. Does not bruise/bleed easily.  Psychiatric/Behavioral: Negative for decreased concentration and dysphoric mood. The patient is not nervous/anxious.        Objective:   Physical Exam  Constitutional: She appears well-developed and well-nourished. No distress.  overwt and well appearing   HENT:  Head: Normocephalic and atraumatic.  Right Ear: External ear normal.  Left Ear: External ear normal.  Mouth/Throat: Oropharynx is clear and moist.  Eyes: Pupils are equal, round, and reactive to light. Conjunctivae and EOM are normal. No scleral icterus.  Neck: Normal range of motion. Neck supple. No JVD present. Carotid bruit is not present. No thyromegaly present.  Cardiovascular: Normal rate, regular rhythm, normal heart sounds and intact distal pulses. Exam reveals no gallop.  Pulmonary/Chest: Effort normal and breath sounds normal. No respiratory distress. She has no wheezes. She  exhibits no tenderness. No breast tenderness, discharge or bleeding.  Abdominal: Soft. Bowel sounds are normal. She exhibits no distension, no abdominal bruit and no mass. There is no tenderness.  Genitourinary: No breast tenderness, discharge or bleeding.  Musculoskeletal: Normal range of motion. She exhibits no edema or tenderness.  Lymphadenopathy:    She has no cervical adenopathy.  Neurological: She is alert. She has normal reflexes. She displays normal reflexes. No cranial nerve deficit. She exhibits normal muscle tone. Coordination normal.  Skin: Skin is warm and dry. No rash noted. No erythema. No pallor.  Solar lentigines diffusely   Psychiatric: She has a normal mood and affect.  Pleasant           Assessment & Plan:   Problem List Items Addressed This Visit      Other   Hyperlipidemia    Disc goals for lipids and reasons to control them Rev last labs with pt Rev low sat fat diet in detail LDL is up despite good diet Plan to start atorvastatin 10 mg daily  Lab in 6 wk  Update if side effects (like muscle pain)        Relevant Medications   atorvastatin (LIPITOR) 10 MG tablet   Routine general medical examination at a health care facility - Primary    Reviewed health habits including diet and exercise and skin cancer prevention Reviewed  appropriate screening tests for age  Also reviewed health mt list, fam hx and immunization status , as well as social and family history   Labs rev Disc shingrix vaccine  Had her flu shot already  Enc to keep up the good work with diet and exercise  3 y colonoscopy due 08/2018

## 2018-01-12 NOTE — Assessment & Plan Note (Signed)
Disc goals for lipids and reasons to control them Rev last labs with pt Rev low sat fat diet in detail LDL is up despite good diet Plan to start atorvastatin 10 mg daily  Lab in 6 wk  Update if side effects (like muscle pain)

## 2018-01-12 NOTE — Assessment & Plan Note (Signed)
Reviewed health habits including diet and exercise and skin cancer prevention Reviewed appropriate screening tests for age  Also reviewed health mt list, fam hx and immunization status , as well as social and family history   Labs rev Disc shingrix vaccine  Had her flu shot already  Enc to keep up the good work with diet and exercise  3 y colonoscopy due 08/2018

## 2018-01-13 ENCOUNTER — Other Ambulatory Visit: Payer: Self-pay | Admitting: Family Medicine

## 2018-01-13 MED ORDER — CELECOXIB 100 MG PO CAPS: 100.0000 mg | ORAL_CAPSULE | Freq: Two times a day (BID) | ORAL | 3 refills | Status: DC | PRN

## 2018-01-13 NOTE — Telephone Encounter (Signed)
I refilled it  Don't take it unless you need to and always with a meal

## 2018-01-13 NOTE — Telephone Encounter (Signed)
Pt notified of Dr. Tower's comments and that Rx was sent to pharmacy  

## 2018-01-13 NOTE — Telephone Encounter (Signed)
Copied from [place] [id number]. Topic: Quick Communication - See Telephone Encounter >> Jan 13, 2018  9:06 AM Ahmed Prima L wrote: CRM for notification. See Telephone encounter for: 01/13/18.  Patient forgotten to mention yesterday to Dr Glori Bickers that she needs a refill on her celecoxib (CELEBREX) 100 MG capsule. She said that the her back doctor that was writing it will not be writing it anymore. She said that Dr Glori Bickers is aware she is taking it.  [facility] [address] [place] - [place] [address] [place] [address]

## 2018-02-22 ENCOUNTER — Telehealth: Payer: Self-pay | Admitting: Family Medicine

## 2018-02-22 DIAGNOSIS — E7800 Pure hypercholesterolemia, unspecified: Secondary | ICD-10-CM

## 2018-02-22 NOTE — Telephone Encounter (Signed)
-----   Message from Ellamae Sia sent at 02/17/2018  8:58 AM EDT ----- Regarding: Lab orders for Monday, 10.21.19 Lab orders for a 6 month follow up appt

## 2018-02-23 ENCOUNTER — Other Ambulatory Visit (INDEPENDENT_AMBULATORY_CARE_PROVIDER_SITE_OTHER): Payer: BC Managed Care – PPO

## 2018-02-23 DIAGNOSIS — E7800 Pure hypercholesterolemia, unspecified: Secondary | ICD-10-CM

## 2018-02-23 LAB — LIPID PANEL
CHOL/HDL RATIO: 2
Cholesterol: 160 mg/dL (ref 0–200)
HDL: 69.5 mg/dL (ref >39.00)
LDL CALC: 81 mg/dL (ref 0–99)
NONHDL: 90.22
TRIGLYCERIDES: 47 mg/dL (ref 0.0–149.0)
VLDL: 9.4 mg/dL (ref 0.0–40.0)

## 2018-02-23 LAB — AST: AST: 17 U/L (ref 0–37)

## 2018-02-23 LAB — ALT: ALT: 13 U/L (ref 0–35)

## 2018-05-15 ENCOUNTER — Other Ambulatory Visit: Payer: Self-pay | Admitting: Family Medicine

## 2018-05-15 DIAGNOSIS — Z1231 Encounter for screening mammogram for malignant neoplasm of breast: Secondary | ICD-10-CM

## 2018-05-19 ENCOUNTER — Encounter: Payer: Self-pay | Admitting: Family Medicine

## 2018-05-21 MED ORDER — MELOXICAM 15 MG PO TABS: 15.0000 mg | ORAL_TABLET | Freq: Every day | ORAL | 5 refills | Status: DC | PRN

## 2018-06-12 ENCOUNTER — Ambulatory Visit
Admission: RE | Admit: 2018-06-12 | Discharge: 2018-06-12 | Disposition: A | Discharge status: Home / Self Care | Payer: BC Managed Care – PPO | Source: Ambulatory Visit | Attending: Family Medicine | Admitting: Family Medicine

## 2018-06-12 DIAGNOSIS — Z1231 Encounter for screening mammogram for malignant neoplasm of breast: Secondary | ICD-10-CM | POA: Insufficient documentation

## 2018-10-07 ENCOUNTER — Other Ambulatory Visit: Payer: Self-pay

## 2018-10-07 ENCOUNTER — Ambulatory Visit (AMBULATORY_SURGERY_CENTER): Payer: Self-pay | Admitting: *Deleted

## 2018-10-07 VITALS — Ht 63.0 in | Wt 159.0 lb

## 2018-10-07 DIAGNOSIS — Z800 Family history of malignant neoplasm of digestive organs: Secondary | ICD-10-CM

## 2018-10-07 NOTE — Progress Notes (Signed)
Patient's pre-visit was done today over the phone with the patient due to Covid-19. Name,DOB and address verified. Insurance verified. Packet of Prep instructions mailed to patient including copy of a consent form and pre-procedure patient acknowledgement form-pt is aware. Patient understands to call us back with any questions or concerns.   Patient denies any allergies to eggs or soy. Patient denies any problems with anesthesia/sedation. Patient denies any oxygen use at home. Patient denies taking any diet/weight loss medications or blood thinners. EMMI education assisgned to patient on colonoscopy, this was explained and instructions given to patient.

## 2018-10-13 ENCOUNTER — Encounter: Payer: Self-pay | Admitting: Internal Medicine

## 2018-10-13 HISTORY — PX: EYE SURGERY: SHX253

## 2018-10-20 ENCOUNTER — Telehealth: Payer: Self-pay | Admitting: Internal Medicine

## 2018-10-20 NOTE — Telephone Encounter (Signed)

## 2018-10-21 ENCOUNTER — Other Ambulatory Visit: Payer: Self-pay

## 2018-10-21 ENCOUNTER — Encounter: Payer: Self-pay | Admitting: Internal Medicine

## 2018-10-21 ENCOUNTER — Ambulatory Visit (AMBULATORY_SURGERY_CENTER): Payer: Medicare Other | Admitting: Internal Medicine

## 2018-10-21 VITALS — BP 126/61 | HR 55 | Temp 98.7°F | Resp 19 | Ht 63.0 in | Wt 159.0 lb

## 2018-10-21 DIAGNOSIS — Z800 Family history of malignant neoplasm of digestive organs: Secondary | ICD-10-CM | POA: Diagnosis not present

## 2018-10-21 MED ORDER — SODIUM CHLORIDE 0.9 % IV SOLN: 500.0000 mL | Freq: Once | INTRAVENOUS | Status: DC

## 2018-10-21 NOTE — Op Note (Signed)
[facility] Patient Name: Megan Harrison Procedure Date: 10/21/2018 10:15 AM MRN: [id number] Endoscopist: Gatha Mayer , MD Age: 63 Referring MD:  Date of Birth: 1955/12/10 Gender: Female Account #: [id number] Procedure:                Colonoscopy Indications:              Screening in patient at increased risk: Colorectal                            cancer in brother 51 or older (dx early 8's) Medicines:                Propofol per Anesthesia, Monitored Anesthesia Care Procedure:                Pre-Anesthesia Assessment:                           - Prior to the procedure, a History and Physical                            was performed, and patient medications and                            allergies were reviewed. The patient's tolerance of                            previous anesthesia was also reviewed. The risks                            and benefits of the procedure and the sedation                            options and risks were discussed with the patient.                            All questions were answered, and informed consent                            was obtained. Prior Anticoagulants: The patient has                            taken no previous anticoagulant or antiplatelet                            agents. ASA Grade Assessment: I - A normal, healthy                            patient. After reviewing the risks and benefits,                            the patient was deemed in satisfactory condition to                            undergo the procedure.  After obtaining informed consent, the colonoscope                            was passed under direct vision. Throughout the                            procedure, the patient's blood pressure, pulse, and                            oxygen saturations were monitored continuously. The                            Colonoscope was introduced through the anus and   advanced to the the cecum, identified by                            appendiceal orifice and ileocecal valve. The                            colonoscopy was performed without difficulty. The                            patient tolerated the procedure well. The quality                            of the bowel preparation was excellent. The bowel                            preparation used was Miralax via split dose                            instruction. The ileocecal valve, appendiceal                            orifice, and rectum were photographed. Scope In: 10:26:21 AM Scope Out: 10:38:15 AM Scope Withdrawal Time: 0 hours 9 minutes 19 seconds  Total Procedure Duration: 0 hours 11 minutes 54 seconds  Findings:                 The perianal and digital rectal examinations were                            normal.                           Multiple diverticula were found in the sigmoid                            colon.                           The exam was otherwise without abnormality on                            direct and retroflexion views. Complications:  No immediate complications. Estimated Blood Loss:     Estimated blood loss: none. Impression:               - Diverticulosis in the sigmoid colon.                           - The examination was otherwise normal on direct                            and retroflexion views.                           - No specimens collected. Recommendation:           - Patient has a contact number available for                            emergencies. The signs and symptoms of potential                            delayed complications were discussed with the                            patient. Return to normal activities tomorrow.                            Written discharge instructions were provided to the                            patient.                           - Resume previous diet.                           - Continue present  medications.                           - Repeat colonoscopy in 5 years for screening                            purposes. Gatha Mayer, MD 10/21/2018 10:46:21 AM This report has been signed electronically.

## 2018-10-21 NOTE — Progress Notes (Signed)
Report given to PACU, vss 

## 2018-10-21 NOTE — Patient Instructions (Addendum)
No polyps or cancer seen.  You do have diverticulosis - thickened muscle rings and pouches in the colon wall. Please read the handout about this condition.  Your next routine colonoscopy should be in 5 years - 2025.  I appreciate the opportunity to care for you. Gatha Mayer, MD, FACG  YOU HAD AN ENDOSCOPIC PROCEDURE TODAY AT [facility]:   Refer to the procedure report that was given to you for any specific questions about what was found during the examination.  If the procedure report does not answer your questions, please call your gastroenterologist to clarify.  If you requested that your care partner not be given the details of your procedure findings, then the procedure report has been included in a sealed envelope for you to review at your convenience later.  YOU SHOULD EXPECT: Some feelings of bloating in the abdomen. Passage of more gas than usual.  Walking can help get rid of the air that was put into your GI tract during the procedure and reduce the bloating. If you had a lower endoscopy (such as a colonoscopy or flexible sigmoidoscopy) you may notice spotting of blood in your stool or on the toilet paper. If you underwent a bowel prep for your procedure, you may not have a normal bowel movement for a few days.  Please Note:  You might notice some irritation and congestion in your nose or some drainage.  This is from the oxygen used during your procedure.  There is no need for concern and it should clear up in a day or so.  SYMPTOMS TO REPORT IMMEDIATELY:   Following lower endoscopy (colonoscopy or flexible sigmoidoscopy):  Excessive amounts of blood in the stool  Significant tenderness or worsening of abdominal pains  Swelling of the abdomen that is new, acute  Fever of 100F or higher   For urgent or emergent issues, a gastroenterologist can be reached at any hour by calling [phone].   DIET:  We do recommend a small meal at first, but then  you may proceed to your regular diet.  Drink plenty of fluids but you should avoid alcoholic beverages for 24 hours.  ACTIVITY:  You should plan to take it easy for the rest of today and you should NOT DRIVE or use heavy machinery until tomorrow (because of the sedation medicines used during the test).    FOLLOW UP: Our staff will call the number listed on your records 48-72 hours following your procedure to check on you and address any questions or concerns that you may have regarding the information given to you following your procedure. If we do not reach you, we will leave a message.  We will attempt to reach you two times.  During this call, we will ask if you have developed any symptoms of COVID 19. If you develop any symptoms (ie: fever, flu-like symptoms, shortness of breath, cough etc.) before then, please call [phone].  If you test positive for Covid 19 in the 2 weeks post procedure, please call and report this information to [place].    If any biopsies were taken you will be contacted by phone or by letter within the next 1-3 weeks.  Please call us at [phone] if you have not heard about the biopsies in 3 weeks.    SIGNATURES/CONFIDENTIALITY: You and/or your care partner have signed paperwork which will be entered into your electronic medical record.  These signatures attest to the fact that that the information  above on your After Visit Summary has been reviewed and is understood.  Full responsibility of the confidentiality of this discharge information lies with you and/or your care-partner. 

## 2018-10-23 ENCOUNTER — Telehealth: Payer: Self-pay | Admitting: *Deleted

## 2018-10-23 NOTE — Telephone Encounter (Signed)
1. Have you developed a fever since your procedure? no  2.   Have you had an respiratory symptoms (SOB or cough) since your procedure? no  3.   Have you tested positive for COVID 19 since your procedure no  4.   Have you had any family members/close contacts diagnosed with the COVID 19 since your procedure?  no   If yes to any of these questions please route to Joylene John, RN and Alphonsa Gin, Therapist, sports.  Follow up Call-  Call back number 10/21/2018  Post procedure Call Back phone  # [phone]  Permission to leave phone message Yes  Some recent data might be hidden     Patient questions:  Do you have a fever, pain , or abdominal swelling? No. Pain Score  0 *  Have you tolerated food without any problems? Yes.    Have you been able to return to your normal activities? Yes.    Do you have any questions about your discharge instructions: Diet   No. Medications  No. Follow up visit  No.  Do you have questions or concerns about your Care? No.  Actions: * If pain score is 4 or above: No action needed, pain <4.

## 2018-12-16 ENCOUNTER — Ambulatory Visit (INDEPENDENT_AMBULATORY_CARE_PROVIDER_SITE_OTHER): Payer: Medicare Other | Admitting: Podiatry

## 2018-12-16 ENCOUNTER — Other Ambulatory Visit: Payer: Self-pay

## 2018-12-16 ENCOUNTER — Ambulatory Visit (INDEPENDENT_AMBULATORY_CARE_PROVIDER_SITE_OTHER): Payer: Medicare Other

## 2018-12-16 ENCOUNTER — Encounter: Payer: Self-pay | Admitting: Podiatry

## 2018-12-16 VITALS — BP 128/76 | HR 80 | Temp 97.3°F | Resp 16

## 2018-12-16 DIAGNOSIS — M67472 Ganglion, left ankle and foot: Secondary | ICD-10-CM | POA: Diagnosis not present

## 2018-12-16 DIAGNOSIS — M778 Other enthesopathies, not elsewhere classified: Secondary | ICD-10-CM

## 2018-12-16 DIAGNOSIS — M779 Enthesopathy, unspecified: Secondary | ICD-10-CM

## 2018-12-16 MED ORDER — CYCLOBENZAPRINE HCL 10 MG PO TABS: 10.0000 mg | ORAL_TABLET | Freq: Three times a day (TID) | ORAL | 0 refills | Status: DC | PRN

## 2018-12-16 NOTE — Progress Notes (Signed)
Subjective:  Patient ID: MIDA CORY, female    DOB: Jun 28, 1955,  MRN: [id number] HPI Chief Complaint  Patient presents with  . Foot Pain    Arch/1st MPJ and toe left - numbness x years, takes meloxicam for arthritis, Rx'd orthotics in the past but they weren't comfortable  . Ankle Pain    Anterior ankle left - lump, tender x 3-4 weeks, pain with pressure  . New Patient (Initial Visit)    Est pt 11/2015    63 y.o. female presents with the above complaint.   ROS: Denies fever chills nausea vomiting muscle aches pains calf pain back pain chest pain shortness of breath.  Past Medical History:  Diagnosis Date  . Anemia   . Cutaneous sarcoidosis   . Degenerative disc disease   . Foot pain   . Hyperlipidemia   . Migraine   . Zoster    Opthy (regular checks)   Past Surgical History:  Procedure Laterality Date  . 2D Echo  1999   Negative  . ABDOMINAL HYSTERECTOMY  11/99  . APPENDECTOMY    . Arm lesion, cutaneous sarcoid  12/2005   with normal CXR and ANA  . CESAREAN SECTION     two times  . COLONOSCOPY  11/0/2009,08/18/13   diverticulosis and hemorrhoids  . DIAGNOSTIC LAPAROSCOPY    . EYE SURGERY  10/13/2018   had shingles in left eye, had to have surgery, has contact lens in now.  Marland Kitchen LAPAROSCOPIC APPENDECTOMY N/A 02/18/2016   Procedure: APPENDECTOMY LAPAROSCOPIC;  Surgeon: Ralene Ok, MD;  Location: [place];  Service: General;  Laterality: N/A;  . LUMBAR LAMINECTOMY  10/2017  . OOPHORECTOMY    . ROTATOR CUFF REPAIR    . TONSILLECTOMY    . UPPER GASTROINTESTINAL ENDOSCOPY  11/15/2010   non-erosive gastritis, H. pylori negative    Current Outpatient Medications:  .  aspirin 81 MG tablet, Take 81 mg by mouth daily. , Disp: , Rfl:  .  atorvastatin (LIPITOR) 10 MG tablet, Take 1 tablet (10 mg total) by mouth daily., Disp: 30 tablet, Rfl: 11 .  Calcium Carbonate (CALCIUM 500 PO), Take 1 capsule by mouth daily.  , Disp: , Rfl:  .  cyclobenzaprine (FLEXERIL) 10 MG  tablet, Take 1 tablet (10 mg total) by mouth 3 (three) times daily as needed for muscle spasms., Disp: 30 tablet, Rfl: 0 .  docusate sodium (COLACE) 100 MG capsule, Take 100 mg by mouth daily., Disp: , Rfl:  .  ferrous sulfate 325 (65 FE) MG tablet, Take 325 mg by mouth daily., Disp: , Rfl:  .  fluticasone (FLONASE) 50 MCG/ACT nasal spray, USE ONE SPRAY(S) IN EACH NOSTRIL ONCE DAILY AS DIRECTED, Disp: 48 g, Rfl: 3 .  meloxicam (MOBIC) 15 MG tablet, Take 1 tablet (15 mg total) by mouth daily as needed for pain. With food, Disp: 30 tablet, Rfl: 5 .  Multiple Vitamins-Minerals (ONE-A-DAY WOMENS 50 PLUS PO), Take 1 tablet by mouth daily.  , Disp: , Rfl:  .  prednisoLONE acetate (PRED FORTE) 1 % ophthalmic suspension, Place 1 drop into the left eye 3 (three) times daily. , Disp: , Rfl:  .  Timolol Maleate 0.5 % (DAILY) SOLN, , Disp: , Rfl:   Allergies  Allergen Reactions  . Codeine     REACTION: syncope  . Sumatriptan     REACTION: choking sensation   Review of Systems Objective:   Vitals:   12/16/18 1554  BP: 128/76  Pulse: 80  Resp: 16  Temp: (!) 97.3 F (36.3 C)    General: Well developed, nourished, in no acute distress, alert and oriented x3   Dermatological: Skin is warm, dry and supple bilateral. Nails x 10 are well maintained; remaining integument appears unremarkable at this time. There are no open sores, no preulcerative lesions, no rash or signs of infection present.  Vascular: Dorsalis Pedis artery and Posterior Tibial artery pedal pulses are 2/4 bilateral with immedate capillary fill time. Pedal hair growth present. No varicosities and no lower extremity edema present bilateral.   Neruologic: Grossly intact via light touch bilateral. Vibratory intact via tuning fork bilateral. Protective threshold with Semmes Wienstein monofilament intact to all pedal sites bilateral. Patellar and Achilles deep tendon reflexes 2+ bilateral. No Babinski or clonus noted bilateral.    Musculoskeletal: No gross boney pedal deformities bilateral. No pain, crepitus, or limitation noted with foot and ankle range of motion bilateral. Muscular strength 5/5 in all groups tested bilateral.  Gait: Unassisted, Nonantalgic.    Radiographs:  Radiographs taken today demonstrate no significant osteoarthritis and no acute findings.  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  Sesamoiditis sub-first bilateral   Plan: At this point I highly recommend anti-inflammatories and follow-up with Liliane Channel for orthotics.  These orthotics need to have someone cut out for the sesamoiditis     Chairty Toman T. [place], [place]

## 2019-01-02 ENCOUNTER — Other Ambulatory Visit: Payer: Self-pay | Admitting: Family Medicine

## 2019-01-06 ENCOUNTER — Ambulatory Visit (INDEPENDENT_AMBULATORY_CARE_PROVIDER_SITE_OTHER): Payer: Medicare Other | Admitting: Orthotics

## 2019-01-06 ENCOUNTER — Other Ambulatory Visit: Payer: Self-pay

## 2019-01-06 DIAGNOSIS — M779 Enthesopathy, unspecified: Secondary | ICD-10-CM

## 2019-01-06 DIAGNOSIS — M67472 Ganglion, left ankle and foot: Secondary | ICD-10-CM

## 2019-01-06 DIAGNOSIS — M778 Other enthesopathies, not elsewhere classified: Secondary | ICD-10-CM

## 2019-01-06 NOTE — Progress Notes (Signed)
Patient came into today to be cast for [facility]. Upon recommendation of Dr. Milinda Pointer Patient presents with hx of sesmoidits, cap LEFT, plantar fibromas Goals are cushioning, arch support for cavus foot, and k-wedge to offload sesmoids Plan vendor

## 2019-01-11 ENCOUNTER — Telehealth: Payer: Self-pay | Admitting: Family Medicine

## 2019-01-11 DIAGNOSIS — Z0000 Encounter for general adult medical examination without abnormal findings: Secondary | ICD-10-CM | POA: Insufficient documentation

## 2019-01-11 DIAGNOSIS — E7800 Pure hypercholesterolemia, unspecified: Secondary | ICD-10-CM

## 2019-01-11 NOTE — Telephone Encounter (Signed)
-----   Message from Ellamae Sia sent at 01/05/2019 10:39 AM EDT ----- Regarding: Lab orders for Tuesday, 9.8.20 Patient is scheduled for CPX labs, please order future labs, Thanks , Karna Christmas

## 2019-01-12 ENCOUNTER — Other Ambulatory Visit (INDEPENDENT_AMBULATORY_CARE_PROVIDER_SITE_OTHER): Payer: Medicare Other

## 2019-01-12 ENCOUNTER — Other Ambulatory Visit: Payer: Self-pay

## 2019-01-12 DIAGNOSIS — E7800 Pure hypercholesterolemia, unspecified: Secondary | ICD-10-CM | POA: Diagnosis not present

## 2019-01-12 DIAGNOSIS — Z0000 Encounter for general adult medical examination without abnormal findings: Secondary | ICD-10-CM | POA: Diagnosis not present

## 2019-01-12 LAB — CBC WITH DIFFERENTIAL/PLATELET
Basophils Absolute: 0 10*3/uL (ref 0.0–0.1)
Basophils Relative: 0.4 % (ref 0.0–3.0)
Eosinophils Absolute: 0.1 10*3/uL (ref 0.0–0.7)
Eosinophils Relative: 1.9 % (ref 0.0–5.0)
HCT: 38.5 % (ref 36.0–46.0)
Hemoglobin: 12.5 g/dL (ref 12.0–15.0)
Lymphocytes Relative: 33.5 % (ref 12.0–46.0)
Lymphs Abs: 2.1 10*3/uL (ref 0.7–4.0)
MCHC: 32.6 g/dL (ref 30.0–36.0)
MCV: 93.8 fl (ref 78.0–100.0)
Monocytes Absolute: 0.7 10*3/uL (ref 0.1–1.0)
Monocytes Relative: 10.6 % (ref 3.0–12.0)
Neutro Abs: 3.4 10*3/uL (ref 1.4–7.7)
Neutrophils Relative %: 53.6 % (ref 43.0–77.0)
Platelets: 295 10*3/uL (ref 150.0–400.0)
RBC: 4.1 Mil/uL (ref 3.87–5.11)
RDW: 13.5 % (ref 11.5–15.5)
WBC: 6.4 10*3/uL (ref 4.0–10.5)

## 2019-01-12 LAB — COMPREHENSIVE METABOLIC PANEL
ALT: 12 U/L (ref 0–35)
AST: 18 U/L (ref 0–37)
Albumin: 4.3 g/dL (ref 3.5–5.2)
Alkaline Phosphatase: 69 U/L (ref 39–117)
BUN: 24 mg/dL — ABNORMAL HIGH (ref 6–23)
CO2: 27 mEq/L (ref 19–32)
Calcium: 9.6 mg/dL (ref 8.4–10.5)
Chloride: 105 mEq/L (ref 96–112)
Creatinine, Ser: 0.72 mg/dL (ref 0.40–1.20)
GFR: 81.23 mL/min (ref >60.00)
Glucose, Bld: 90 mg/dL (ref 70–99)
Potassium: 4.1 mEq/L (ref 3.5–5.1)
Sodium: 140 mEq/L (ref 135–145)
Total Bilirubin: 0.3 mg/dL (ref 0.2–1.2)
Total Protein: 7.5 g/dL (ref 6.0–8.3)

## 2019-01-12 LAB — LIPID PANEL
Cholesterol: 156 mg/dL (ref 0–200)
HDL: 61.4 mg/dL (ref >39.00)
LDL Cholesterol: 85 mg/dL (ref 0–99)
NonHDL: 94.53
Total CHOL/HDL Ratio: 3
Triglycerides: 49 mg/dL (ref 0.0–149.0)
VLDL: 9.8 mg/dL (ref 0.0–40.0)

## 2019-01-12 LAB — TSH: TSH: 2.72 u[IU]/mL (ref 0.35–4.50)

## 2019-01-18 ENCOUNTER — Ambulatory Visit (INDEPENDENT_AMBULATORY_CARE_PROVIDER_SITE_OTHER): Payer: Medicare Other | Admitting: Family Medicine

## 2019-01-18 ENCOUNTER — Encounter: Payer: Self-pay | Admitting: Family Medicine

## 2019-01-18 ENCOUNTER — Other Ambulatory Visit: Payer: Self-pay

## 2019-01-18 VITALS — BP 122/80 | HR 70 | Temp 98.7°F | Ht 63.25 in | Wt 167.5 lb

## 2019-01-18 DIAGNOSIS — E7800 Pure hypercholesterolemia, unspecified: Secondary | ICD-10-CM

## 2019-01-18 DIAGNOSIS — E2839 Other primary ovarian failure: Secondary | ICD-10-CM

## 2019-01-18 DIAGNOSIS — Z23 Encounter for immunization: Secondary | ICD-10-CM

## 2019-01-18 DIAGNOSIS — Z0000 Encounter for general adult medical examination without abnormal findings: Secondary | ICD-10-CM | POA: Diagnosis not present

## 2019-01-18 MED ORDER — ATORVASTATIN CALCIUM 10 MG PO TABS: 10.0000 mg | ORAL_TABLET | Freq: Every day | ORAL | 3 refills | Status: DC

## 2019-01-18 MED ORDER — FLUTICASONE PROPIONATE 50 MCG/ACT NA SUSP: NASAL | 3 refills | Status: DC

## 2019-01-18 MED ORDER — MELOXICAM 15 MG PO TABS: 15.0000 mg | ORAL_TABLET | Freq: Every day | ORAL | 3 refills | Status: DC | PRN

## 2019-01-18 NOTE — Progress Notes (Signed)
Subjective:    Patient ID: Megan Harrison, female    DOB: 28-Feb-1956, 63 y.o.   MRN: [id number]  HPI  Here for welcome to medicare visit   I have personally reviewed the Medicare Annual Wellness questionnaire and have noted 1. The patient's medical and social history 2. Their use of alcohol, tobacco or illicit drugs 3. Their current medications and supplements 4. The patient's functional ability including ADL's, fall risks, home safety risks and hearing or visual             impairment. 5. Diet and physical activities 6. Evidence for depression or mood disorders  The patients weight, height, BMI have been recorded in the chart and visual acuity is per eye clinic.  I have made referrals, counseling and provided education to the patient based review of the above and I have provided the pt with a written personalized care plan for preventive services. Reviewed and updated provider list, see scanned forms.  See scanned forms.  Routine anticipatory guidance given to patient.  See health maintenance. Colon cancer screening colonoscopy 6/20 with 5 y recall  Brother had colon cancer in early 60s Breast cancer screening mammogram 2/20 Self breast exam-no lumps  Not seeing gyn  No gyn issues  Flu vaccine today Tetanus vaccine Tdap 7/16 Pneumovax due for prevnar-will do today  Zoster vaccine  7/15 Dexa-interested  Falls- none  Fractures- none Supplements takes ca and D Exercise -walking   Advance directive-has one , poa is BIL Ron Williams  Cognitive function addressed- see scanned forms- and if abnormal then additional documentation follows.  No issues or concerns with memory  PMH and SH reviewed  Meds, vitals, and allergies reviewed.   ROS: See HPI.  Otherwise negative.    Weight : Wt Readings from Last 3 Encounters:  01/18/19 167 lb 8 oz (76 kg)  10/21/18 159 lb (72.1 kg)  10/07/18 159 lb (72.1 kg)  back on weight watchers since aug 1   Lost 10 lb so far ! Walking for  exercise  29.44 kg/m   Hearing/vision:  Hearing Screening   125Hz  250Hz  500Hz  1000Hz  2000Hz  3000Hz  4000Hz  6000Hz  8000Hz   Right ear:   40 40 40  40    Left ear:   40 40 40  40    Vision Screening Comments: Pt had an eye exam with Dr. Jeni Salles in July 2020  BP Readings from Last 3 Encounters:  01/18/19 (!) 146/72  12/16/18 128/76  10/21/18 126/61  BP: 122/80  Improved on 2nc check  Pulse Readings from Last 3 Encounters:  01/18/19 70  12/16/18 80  10/21/18 (!) 55   Hyperlipidemia Lab Results  Component Value Date   CHOL 156 01/12/2019   CHOL 160 02/23/2018   CHOL 231 (H) 01/06/2018   Lab Results  Component Value Date   HDL 61.40 01/12/2019   HDL 69.50 02/23/2018   HDL 66.30 01/06/2018   Lab Results  Component Value Date   LDLCALC 85 01/12/2019   LDLCALC 81 02/23/2018   LDLCALC 153 (H) 01/06/2018   Lab Results  Component Value Date   TRIG 49.0 01/12/2019   TRIG 47.0 02/23/2018   TRIG 59.0 01/06/2018   Lab Results  Component Value Date   CHOLHDL 3 01/12/2019   CHOLHDL 2 02/23/2018   CHOLHDL 3 01/06/2018   Lab Results  Component Value Date   LDLDIRECT 102.9 06/10/2011  atorvastatin 10  Good diet   Other labs: Results for orders placed or performed in  visit on 01/12/19  TSH  Result Value Ref Range   TSH 2.72 0.35 - 4.50 uIU/mL  Lipid panel  Result Value Ref Range   Cholesterol 156 0 - 200 mg/dL   Triglycerides 49.0 0.0 - 149.0 mg/dL   HDL 61.40 >39.00 mg/dL   VLDL 9.8 0.0 - 40.0 mg/dL   LDL Cholesterol 85 0 - 99 mg/dL   Total CHOL/HDL Ratio 3    NonHDL 94.53   Comprehensive metabolic panel  Result Value Ref Range   Sodium 140 135 - 145 mEq/L   Potassium 4.1 3.5 - 5.1 mEq/L   Chloride 105 96 - 112 mEq/L   CO2 27 19 - 32 mEq/L   Glucose, Bld 90 70 - 99 mg/dL   BUN 24 (H) 6 - 23 mg/dL   Creatinine, Ser 0.72 0.40 - 1.20 mg/dL   Total Bilirubin 0.3 0.2 - 1.2 mg/dL   Alkaline Phosphatase 69 39 - 117 U/L   AST 18 0 - 37 U/L   ALT 12 0 - 35 U/L    Total Protein 7.5 6.0 - 8.3 g/dL   Albumin 4.3 3.5 - 5.2 g/dL   Calcium 9.6 8.4 - 10.5 mg/dL   GFR 81.23 >60.00 mL/min  CBC with Differential/Platelet  Result Value Ref Range   WBC 6.4 4.0 - 10.5 K/uL   RBC 4.10 3.87 - 5.11 Mil/uL   Hemoglobin 12.5 12.0 - 15.0 g/dL   HCT 38.5 36.0 - 46.0 %   MCV 93.8 78.0 - 100.0 fl   MCHC 32.6 30.0 - 36.0 g/dL   RDW 13.5 11.5 - 15.5 %   Platelets 295.0 150.0 - 400.0 K/uL   Neutrophils Relative % 53.6 43.0 - 77.0 %   Lymphocytes Relative 33.5 12.0 - 46.0 %   Monocytes Relative 10.6 3.0 - 12.0 %   Eosinophils Relative 1.9 0.0 - 5.0 %   Basophils Relative 0.4 0.0 - 3.0 %   Neutro Abs 3.4 1.4 - 7.7 K/uL   Lymphs Abs 2.1 0.7 - 4.0 K/uL   Monocytes Absolute 0.7 0.1 - 1.0 K/uL   Eosinophils Absolute 0.1 0.0 - 0.7 K/uL   Basophils Absolute 0.0 0.0 - 0.1 K/uL     Patient Active Problem List   Diagnosis Date Noted  . Estrogen deficiency 01/18/2019  . Welcome to Medicare preventive visit 01/11/2019  . Family history of colon cancer - brother in early 75's 10/21/2018  . Encounter for screening mammogram for breast cancer 05/19/2017  . Hyperlipidemia 12/10/2016  . Neck pain 04/05/2016  . Encounter for routine gynecological examination 11/04/2014  . Hemorrhoids, internal 04/02/2013  . Routine general medical examination at a health care facility 06/09/2011  . ALLERGIC RHINITIS 03/10/2009  . History of shingles 08/28/2006   Past Medical History:  Diagnosis Date  . Anemia   . Cutaneous sarcoidosis   . Degenerative disc disease   . Foot pain   . Hyperlipidemia   . Migraine   . Zoster    Opthy (regular checks)   Past Surgical History:  Procedure Laterality Date  . 2D Echo  1999   Negative  . ABDOMINAL HYSTERECTOMY  11/99  . APPENDECTOMY    . Arm lesion, cutaneous sarcoid  12/2005   with normal CXR and ANA  . CESAREAN SECTION     two times  . COLONOSCOPY  11/0/2009,08/18/13   diverticulosis and hemorrhoids  . DIAGNOSTIC LAPAROSCOPY    .  EYE SURGERY  10/13/2018   had shingles in left eye, had to  have surgery, has contact lens in now.  Marland Kitchen LAPAROSCOPIC APPENDECTOMY N/A 02/18/2016   Procedure: APPENDECTOMY LAPAROSCOPIC;  Surgeon: Ralene Ok, MD;  Location: [place];  Service: General;  Laterality: N/A;  . LUMBAR LAMINECTOMY  10/2017  . OOPHORECTOMY    . ROTATOR CUFF REPAIR    . TONSILLECTOMY    . UPPER GASTROINTESTINAL ENDOSCOPY  11/15/2010   non-erosive gastritis, H. pylori negative   Social History   Tobacco Use  . Smoking status: Never Smoker  . Smokeless tobacco: Never Used  Substance Use Topics  . Alcohol use: No    Alcohol/week: 0.0 standard drinks  . Drug use: No   Family History  Problem Relation Age of Onset  . Colon cancer Brother        early 36's  . Stomach cancer Brother   . Heart disease Mother        CAD  . Alcohol abuse Mother   . Lung cancer Father   . Heart disease Brother        CAD  . Throat cancer Brother   . Heart disease Brother        CAD, pre diabetic, obese  . Colon polyps Sister   . Breast cancer Neg Hx   . Rectal cancer Neg Hx    Allergies  Allergen Reactions  . Codeine     REACTION: syncope  . Sumatriptan     REACTION: choking sensation   Current Outpatient Medications on File Prior to Visit  Medication Sig Dispense Refill  . aspirin 81 MG tablet Take 81 mg by mouth daily.     . Calcium Carbonate (CALCIUM 500 PO) Take 1 capsule by mouth daily.      . cyclobenzaprine (FLEXERIL) 10 MG tablet Take 1 tablet (10 mg total) by mouth 3 (three) times daily as needed for muscle spasms. 30 tablet 0  . ferrous sulfate 325 (65 FE) MG tablet Take 325 mg by mouth daily.    . Multiple Vitamins-Minerals (ONE-A-DAY WOMENS 50 PLUS PO) Take 1 tablet by mouth daily.      . prednisoLONE acetate (PRED FORTE) 1 % ophthalmic suspension Place 1 drop into the left eye 3 (three) times daily.     . Timolol Maleate 0.5 % (DAILY) SOLN      No current facility-administered medications on file prior  to visit.     Review of Systems  Constitutional: Negative for activity change, appetite change, fatigue, fever and unexpected weight change.  HENT: Negative for congestion, ear pain, rhinorrhea, sinus pressure and sore throat.   Eyes: Negative for pain, redness and visual disturbance.  Respiratory: Negative for cough, shortness of breath and wheezing.   Cardiovascular: Negative for chest pain and palpitations.  Gastrointestinal: Negative for abdominal pain, blood in stool, constipation and diarrhea.  Endocrine: Negative for polydipsia and polyuria.  Genitourinary: Negative for dysuria, frequency and urgency.  Musculoskeletal: Negative for arthralgias, back pain and myalgias.  Skin: Negative for pallor and rash.  Allergic/Immunologic: Negative for environmental allergies.  Neurological: Negative for dizziness, syncope and headaches.  Hematological: Negative for adenopathy. Does not bruise/bleed easily.  Psychiatric/Behavioral: Negative for decreased concentration and dysphoric mood. The patient is not nervous/anxious.        Objective:   Physical Exam Constitutional:      General: She is not in acute distress.    Appearance: Normal appearance. She is well-developed. She is obese. She is not ill-appearing or diaphoretic.  HENT:     Head: Normocephalic and atraumatic.  Right Ear: Tympanic membrane, ear canal and external ear normal.     Left Ear: Tympanic membrane, ear canal and external ear normal.     Nose: Nose normal. No congestion.     Mouth/Throat:     Mouth: Mucous membranes are moist.     Pharynx: Oropharynx is clear. No posterior oropharyngeal erythema.  Eyes:     General: No scleral icterus.    Extraocular Movements: Extraocular movements intact.     Conjunctiva/sclera: Conjunctivae normal.     Pupils: Pupils are equal, round, and reactive to light.  Neck:     Musculoskeletal: Normal range of motion and neck supple. No neck rigidity or muscular tenderness.      Thyroid: No thyromegaly.     Vascular: No carotid bruit or JVD.  Cardiovascular:     Rate and Rhythm: Normal rate and regular rhythm.     Pulses: Normal pulses.     Heart sounds: Normal heart sounds. No gallop.   Pulmonary:     Effort: Pulmonary effort is normal. No respiratory distress.     Breath sounds: Normal breath sounds. No wheezing.     Comments: Good air exch Chest:     Chest wall: No tenderness.  Abdominal:     General: Bowel sounds are normal. There is no distension or abdominal bruit.     Palpations: Abdomen is soft. There is no mass.     Tenderness: There is no abdominal tenderness.     Hernia: No hernia is present.  Genitourinary:    Comments: Breast exam: No mass, nodules, thickening, tenderness, bulging, retraction, inflamation, nipple discharge or skin changes noted.  No axillary or clavicular LA.     Musculoskeletal: Normal range of motion.        General: No tenderness.     Right lower leg: No edema.     Left lower leg: No edema.  Lymphadenopathy:     Cervical: No cervical adenopathy.  Skin:    General: Skin is warm and dry.     Coloration: Skin is not pale.     Findings: No erythema or rash.     Comments: Solar lentigines diffusely   Neurological:     Mental Status: She is alert. Mental status is at baseline.     Cranial Nerves: No cranial nerve deficit.     Motor: No abnormal muscle tone.     Coordination: Coordination normal.     Gait: Gait normal.     Deep Tendon Reflexes: Reflexes are normal and symmetric. Reflexes normal.  Psychiatric:        Mood and Affect: Mood normal.        Cognition and Memory: Cognition and memory normal.           Assessment & Plan:   Problem List Items Addressed This Visit      Other   Hyperlipidemia    Well controlled with atorvastatin and diet  Disc goals for lipids and reasons to control them Rev last labs with pt Rev low sat fat diet in detail  LDL of 85      Relevant Medications   atorvastatin  (LIPITOR) 10 MG tablet   Welcome to Medicare preventive visit - Primary    Reviewed health habits including diet and exercise and skin cancer prevention Reviewed appropriate screening tests for age  Also reviewed health mt list, fam hx and immunization status , as well as social and family history   See HPI Labs reviewed  Flu shot  today prevnar vaccine today  Referred for dexa, enc to continue ca and D and exercise No falls Has advance directive -given materials if she wants to change it  No cognitive concerns Nl hearing screen  Eye exam utd         Estrogen deficiency    Post menopausal  Referral done for bone density test  Disc need for calcium/ vitamin D/ wt bearing exercise and bone density test every 2 y to monitor Disc safety/ fracture risk in detail        Relevant Orders   DG Bone Density    Other Visit Diagnoses    Need for influenza vaccination       Relevant Orders   Flu Vaccine QUAD High Dose(Fluad) (Completed)   Need for vaccination with 13-polyvalent pneumococcal conjugate vaccine       Relevant Orders   Pneumococcal conjugate vaccine 13-valent (Completed)

## 2019-01-18 NOTE — Patient Instructions (Addendum)
If you are interested in shingrix vaccine (extra protection for shingles)- call your ins to see if it covered If it is-get is / get a pharmacy  Flu shot today  prevnar vaccine today   Stop at check out to schedule bone density test   Keep walking and take care of yourself

## 2019-01-18 NOTE — Assessment & Plan Note (Signed)
Post menopausal  Referral done for bone density test  Disc need for calcium/ vitamin D/ wt bearing exercise and bone density test every 2 y to monitor Disc safety/ fracture risk in detail

## 2019-01-18 NOTE — Assessment & Plan Note (Signed)
Well controlled with atorvastatin and diet  Disc goals for lipids and reasons to control them Rev last labs with pt Rev low sat fat diet in detail  LDL of 85

## 2019-01-18 NOTE — Assessment & Plan Note (Signed)
Reviewed health habits including diet and exercise and skin cancer prevention Reviewed appropriate screening tests for age  Also reviewed health mt list, fam hx and immunization status , as well as social and family history   See HPI Labs reviewed  Flu shot today prevnar vaccine today  Referred for dexa, enc to continue ca and D and exercise No falls Has advance directive -given materials if she wants to change it  No cognitive concerns Nl hearing screen  Eye exam utd

## 2019-01-27 ENCOUNTER — Ambulatory Visit (INDEPENDENT_AMBULATORY_CARE_PROVIDER_SITE_OTHER): Payer: Medicare Other | Admitting: Podiatry

## 2019-01-27 ENCOUNTER — Other Ambulatory Visit: Payer: Self-pay

## 2019-01-27 ENCOUNTER — Encounter: Payer: Self-pay | Admitting: Podiatry

## 2019-01-27 DIAGNOSIS — M778 Other enthesopathies, not elsewhere classified: Secondary | ICD-10-CM

## 2019-01-27 DIAGNOSIS — M779 Enthesopathy, unspecified: Secondary | ICD-10-CM

## 2019-01-27 NOTE — Progress Notes (Signed)
Patient presents for orthotic pick up.  Verbal and written break in and wear instructions given.  Patient will follow up in 4 weeks with Dr if symptoms worsen or fail to improve. 

## 2019-01-28 ENCOUNTER — Ambulatory Visit
Admission: RE | Admit: 2019-01-28 | Discharge: 2019-01-28 | Disposition: A | Discharge status: Home / Self Care | Payer: Medicare Other | Source: Ambulatory Visit | Attending: Family Medicine | Admitting: Family Medicine

## 2019-01-28 DIAGNOSIS — E2839 Other primary ovarian failure: Secondary | ICD-10-CM | POA: Insufficient documentation

## 2019-01-31 ENCOUNTER — Encounter: Payer: Self-pay | Admitting: Family Medicine

## 2019-01-31 DIAGNOSIS — M8580 Other specified disorders of bone density and structure, unspecified site: Secondary | ICD-10-CM | POA: Insufficient documentation

## 2019-02-10 ENCOUNTER — Other Ambulatory Visit: Payer: Medicare Other | Admitting: Orthotics

## 2019-05-25 ENCOUNTER — Encounter: Payer: Self-pay | Admitting: Family Medicine

## 2019-05-27 ENCOUNTER — Encounter: Payer: Self-pay | Admitting: Family Medicine

## 2019-05-27 ENCOUNTER — Other Ambulatory Visit: Payer: Self-pay

## 2019-05-27 ENCOUNTER — Ambulatory Visit (INDEPENDENT_AMBULATORY_CARE_PROVIDER_SITE_OTHER): Payer: Medicare PPO | Admitting: Family Medicine

## 2019-05-27 DIAGNOSIS — S29019A Strain of muscle and tendon of unspecified wall of thorax, initial encounter: Secondary | ICD-10-CM | POA: Insufficient documentation

## 2019-05-27 MED ORDER — CYCLOBENZAPRINE HCL 10 MG PO TABS: 10.0000 mg | ORAL_TABLET | Freq: Three times a day (TID) | ORAL | 1 refills | Status: DC | PRN

## 2019-05-27 NOTE — Assessment & Plan Note (Signed)
With tenderness  Rhomboid stretch in the office does target the area  Adv massage and continued stretching Handout given  meloxicam prn  Flexeril prn caution of sedation  Heat  Ref to PT made  Watch for any neuro changes  Update if not starting to improve in a week or if worsening

## 2019-05-27 NOTE — Progress Notes (Signed)
Subjective:    Patient ID: Megan Harrison, female    DOB: 1955-11-19, 64 y.o.   MRN: [id number]  This visit occurred during the SARS-CoV-2 public health emergency.  Safety protocols were in place, including screening questions prior to the visit, additional usage of staff PPE, and extensive cleaning of exam room while observing appropriate contact time as indicated for disinfecting solutions.    HPI Pt presents for back pain   Message from mychart yesterday:  Hi, Dr. Glori Bickers, I seem to have strained a muscle in my back at my right shoulder blade.  I don't recall doing any one thing to injure it, but it started hurting Thursday evening.  It feels like a knife stabbing me, and hasn't gotten any better.  I have been taking cyclobenzapr (10 MG) and extra strength Tylenol.  I am using a heating pad.  Should I be doing something else, or should I make an appointment?  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 Thoracic myofascial strain 05/27/2019  . Osteopenia 01/31/2019  . Estrogen deficiency 01/18/2019  . Welcome to Medicare preventive visit 01/11/2019  . Family history of colon cancer - brother in early 48's 10/21/2018  . Encounter for screening mammogram for breast cancer 05/19/2017  . Hyperlipidemia 12/10/2016  . Neck pain 04/05/2016  . Encounter for routine gynecological examination 11/04/2014  . Hemorrhoids, internal 04/02/2013  . Routine general medical examination at a health care facility  06/09/2011  . ALLERGIC RHINITIS 03/10/2009  . History of shingles 08/28/2006   Past Medical History:  Diagnosis Date  . Anemia   . Cutaneous sarcoidosis   . Degenerative disc disease   . Foot pain   . Hyperlipidemia   . Migraine   . Zoster    Opthy (regular checks)   Past Surgical History:  Procedure Laterality Date  . 2D Echo  1999   Negative  . ABDOMINAL HYSTERECTOMY  11/99  . APPENDECTOMY    . Arm lesion, cutaneous sarcoid  12/2005   with normal CXR and ANA  . CESAREAN SECTION     two times  . COLONOSCOPY  11/0/2009,08/18/13   diverticulosis and hemorrhoids  . DIAGNOSTIC LAPAROSCOPY    . EYE SURGERY  10/13/2018   had shingles in left eye, had to have surgery, has contact lens in now.  Marland Kitchen LAPAROSCOPIC APPENDECTOMY N/A 02/18/2016   Procedure: APPENDECTOMY LAPAROSCOPIC;  Surgeon: Ralene Ok, MD;  Location: [facility];  Service: General;  Laterality: N/A;  . LUMBAR LAMINECTOMY  10/2017  . OOPHORECTOMY    . ROTATOR CUFF REPAIR    . TONSILLECTOMY    . UPPER GASTROINTESTINAL ENDOSCOPY  11/15/2010   non-erosive gastritis, H. pylori negative   Social History   Tobacco Use  . Smoking status: Never Smoker  . Smokeless tobacco: Never Used  Substance Use Topics  . Alcohol use: No    Alcohol/week: 0.0  standard drinks  . Drug use: No   Family History  Problem Relation Age of Onset  . Colon cancer Brother        early 24's  . Stomach cancer Brother   . Heart disease Mother        CAD  . Alcohol abuse Mother   . Lung cancer Father   . Heart disease Brother        CAD  . Throat cancer Brother   . Heart disease Brother        CAD, pre diabetic, obese  . Colon polyps Sister   . Breast cancer Neg Hx   . Rectal cancer Neg Hx    Allergies  Allergen Reactions  . Codeine     REACTION: syncope  . Sumatriptan     REACTION: choking sensation   Current Outpatient Medications on File Prior to Visit  Medication Sig Dispense Refill  . aspirin 81 MG tablet Take 81 mg by  mouth daily.     Marland Kitchen atorvastatin (LIPITOR) 10 MG tablet Take 1 tablet (10 mg total) by mouth daily. 90 tablet 3  . Calcium Carbonate (CALCIUM 500 PO) Take 1 capsule by mouth daily.      . cyclobenzaprine (FLEXERIL) 10 MG tablet Take 1 tablet (10 mg total) by mouth 3 (three) times daily as needed for muscle spasms. 30 tablet 0  . ferrous sulfate 325 (65 FE) MG tablet Take 325 mg by mouth daily.    . fluticasone (FLONASE) 50 MCG/ACT nasal spray USE ONE SPRAY(S) IN EACH NOSTRIL ONCE DAILY AS DIRECTED 48 g 3  . meloxicam (MOBIC) 15 MG tablet Take 1 tablet (15 mg total) by mouth daily as needed for pain. With food 90 tablet 3  . Multiple Vitamins-Minerals (ONE-A-DAY WOMENS 50 PLUS PO) Take 1 tablet by mouth daily.      . prednisoLONE acetate (PRED FORTE) 1 % ophthalmic suspension Place 1 drop into the left eye 3 (three) times daily.     . Timolol Maleate 0.5 % (DAILY) SOLN      No current facility-administered medications on file prior to visit.     Review of Systems  Constitutional: Negative for activity change, appetite change, fatigue, fever and unexpected weight change.  HENT: Negative for congestion, ear pain, rhinorrhea, sinus pressure and sore throat.   Eyes: Negative for pain, redness and visual disturbance.  Respiratory: Negative for cough, shortness of breath and wheezing.   Cardiovascular: Negative for chest pain and palpitations.  Gastrointestinal: Negative for abdominal pain, blood in stool, constipation and diarrhea.  Endocrine: Negative for polydipsia and polyuria.  Genitourinary: Negative for dysuria, frequency and urgency.  Musculoskeletal: Positive for back pain. Negative for arthralgias and myalgias.  Skin: Negative for pallor and rash.  Allergic/Immunologic: Negative for environmental allergies.  Neurological: Negative for dizziness, syncope and headaches.  Hematological: Negative for adenopathy. Does not bruise/bleed easily.  Psychiatric/Behavioral: Negative for decreased  concentration and dysphoric mood. The patient is not nervous/anxious.        Objective:   Physical Exam Constitutional:      General: She is not in acute distress.    Appearance: Normal appearance. She is well-developed. She is obese. She is not ill-appearing or diaphoretic.  HENT:     Head: Normocephalic and atraumatic.  Eyes:     General: No scleral icterus.    Conjunctiva/sclera: Conjunctivae normal.     Pupils: Pupils are equal, round, and reactive to light.  Cardiovascular:     Rate and  Rhythm: Normal rate and regular rhythm.  Pulmonary:     Effort: Pulmonary effort is normal.     Breath sounds: Normal breath sounds. No wheezing or rales.  Abdominal:     General: There is no distension.  Musculoskeletal:        General: Tenderness present.     Cervical back: Normal range of motion and neck supple.     Thoracic back: Spasms and tenderness present. No swelling, deformity, signs of trauma or bony tenderness. No scoliosis.     Lumbar back: No edema, spasms or bony tenderness.     Comments: Tender in musculature between R scapula and spine No spinal tenderness  Spasm noted in thoracic musculature  No ecchymosis or skin change Nl rom of arms and neck   Rhomboid stretch in office targets affected area   Neg rotator cuff signs for R shoulder     Lymphadenopathy:     Cervical: No cervical adenopathy.  Skin:    General: Skin is warm and dry.     Coloration: Skin is not pale.     Findings: No erythema or rash.  Neurological:     Mental Status: She is alert.     Cranial Nerves: No cranial nerve deficit.     Sensory: No sensory deficit.     Motor: No atrophy or abnormal muscle tone.     Coordination: Coordination normal.     Deep Tendon Reflexes: Reflexes are normal and symmetric.     Comments: Negative SLR  Psychiatric:        Mood and Affect: Mood normal.           Assessment & Plan:   Problem List Items Addressed This Visit      Musculoskeletal and  Integument   Thoracic myofascial strain    With tenderness  Rhomboid stretch in the office does target the area  Adv massage and continued stretching Handout given  meloxicam prn  Flexeril prn caution of sedation  Heat  Ref to PT made  Watch for any neuro changes  Update if not starting to improve in a week or if worsening        Relevant Orders   Ambulatory referral to Physical Therapy

## 2019-05-27 NOTE — Patient Instructions (Addendum)
Massage the area if tolerated Continue heat  Take a look at the rehab handout  Do the rhomboid stretch we did in the office   We will call you regarding PT   Continue current medications if they help

## 2019-05-31 DIAGNOSIS — M546 Pain in thoracic spine: Secondary | ICD-10-CM | POA: Diagnosis not present

## 2019-06-03 DIAGNOSIS — M546 Pain in thoracic spine: Secondary | ICD-10-CM | POA: Diagnosis not present

## 2019-06-07 DIAGNOSIS — M546 Pain in thoracic spine: Secondary | ICD-10-CM | POA: Diagnosis not present

## 2019-06-09 ENCOUNTER — Encounter: Payer: Self-pay | Admitting: Family Medicine

## 2019-06-10 DIAGNOSIS — M546 Pain in thoracic spine: Secondary | ICD-10-CM | POA: Diagnosis not present

## 2019-06-11 ENCOUNTER — Other Ambulatory Visit: Payer: Self-pay | Admitting: Family Medicine

## 2019-06-11 DIAGNOSIS — Z1231 Encounter for screening mammogram for malignant neoplasm of breast: Secondary | ICD-10-CM

## 2019-06-14 DIAGNOSIS — M546 Pain in thoracic spine: Secondary | ICD-10-CM | POA: Diagnosis not present

## 2019-06-17 DIAGNOSIS — M546 Pain in thoracic spine: Secondary | ICD-10-CM | POA: Diagnosis not present

## 2019-06-22 DIAGNOSIS — M546 Pain in thoracic spine: Secondary | ICD-10-CM | POA: Diagnosis not present

## 2019-06-29 DIAGNOSIS — M546 Pain in thoracic spine: Secondary | ICD-10-CM | POA: Diagnosis not present

## 2019-07-01 DIAGNOSIS — M546 Pain in thoracic spine: Secondary | ICD-10-CM | POA: Diagnosis not present

## 2019-07-07 DIAGNOSIS — H18422 Band keratopathy, left eye: Secondary | ICD-10-CM | POA: Diagnosis not present

## 2019-07-08 ENCOUNTER — Ambulatory Visit
Admission: RE | Admit: 2019-07-08 | Discharge: 2019-07-08 | Disposition: A | Discharge status: Home / Self Care | Payer: Medicare PPO | Source: Ambulatory Visit | Attending: Family Medicine | Admitting: Family Medicine

## 2019-07-08 DIAGNOSIS — Z1231 Encounter for screening mammogram for malignant neoplasm of breast: Secondary | ICD-10-CM | POA: Diagnosis not present

## 2019-11-09 DIAGNOSIS — H43393 Other vitreous opacities, bilateral: Secondary | ICD-10-CM | POA: Diagnosis not present

## 2019-11-09 DIAGNOSIS — H43399 Other vitreous opacities, unspecified eye: Secondary | ICD-10-CM | POA: Diagnosis not present

## 2019-11-24 ENCOUNTER — Other Ambulatory Visit: Payer: Self-pay

## 2019-11-24 ENCOUNTER — Ambulatory Visit: Payer: Medicare PPO | Admitting: Dermatology

## 2019-11-24 ENCOUNTER — Encounter: Payer: Self-pay | Admitting: Dermatology

## 2019-11-24 DIAGNOSIS — L821 Other seborrheic keratosis: Secondary | ICD-10-CM

## 2019-11-24 DIAGNOSIS — L818 Other specified disorders of pigmentation: Secondary | ICD-10-CM | POA: Diagnosis not present

## 2019-11-24 DIAGNOSIS — I8310 Varicose veins of unspecified lower extremity with inflammation: Secondary | ICD-10-CM

## 2019-11-24 DIAGNOSIS — D1800 Hemangioma unspecified site: Secondary | ICD-10-CM | POA: Diagnosis not present

## 2019-11-24 DIAGNOSIS — L578 Other skin changes due to chronic exposure to nonionizing radiation: Secondary | ICD-10-CM

## 2019-11-24 DIAGNOSIS — Z1283 Encounter for screening for malignant neoplasm of skin: Secondary | ICD-10-CM | POA: Diagnosis not present

## 2019-11-24 DIAGNOSIS — D229 Melanocytic nevi, unspecified: Secondary | ICD-10-CM

## 2019-11-24 DIAGNOSIS — L814 Other melanin hyperpigmentation: Secondary | ICD-10-CM

## 2019-11-24 DIAGNOSIS — Z86018 Personal history of other benign neoplasm: Secondary | ICD-10-CM

## 2019-11-24 NOTE — Progress Notes (Signed)
   Follow-Up Visit   Subjective  Megan Harrison is a 64 y.o. female who presents for the following: Annual Exam (Hx severely dysplastic nevus - no new or changing moles, lesions, or spots that the patient has noticed). The patient presents for Total-Body Skin Exam (TBSE) for skin cancer screening and mole check.  The following portions of the chart were reviewed this encounter and updated as appropriate:  Tobacco  Allergies  Meds  Problems  Med Hx  Surg Hx  Fam Hx     Review of Systems:  No other skin or systemic complaints except as noted in HPI or Assessment and Plan.  Objective  Well appearing patient in no apparent distress; mood and affect are within normal limits.  A full examination was performed including scalp, head, eyes, ears, nose, lips, neck, chest, axillae, abdomen, back, buttocks, bilateral upper extremities, bilateral lower extremities, hands, feet, fingers, toes, fingernails, and toenails. All findings within normal limits unless otherwise noted below.  Objective  L mid back 6.0 cm lat to spine: Scar with no evidence of recurrence.   Objective  B/L leg: Hypopigmented macules  Assessment & Plan    History of dysplastic nevus L mid back 6.0 cm lat to spine  Clear. Observe for recurrence. Call clinic for new or changing lesions.  Recommend regular skin exams, daily broad-spectrum spf 30+ sunscreen use, and photoprotection.     Idiopathic guttate hypomelanosis B/L leg  Benign, observe.    Skin cancer screening   Lentigines - Scattered tan macules - Discussed due to sun exposure - Benign, observe - Call for any changes  Seborrheic Keratoses - Stuck-on, waxy, tan-brown papules and plaques  - Discussed benign etiology and prognosis. - Observe - Call for any changes  Melanocytic Nevi - Tan-brown and/or pink-flesh-colored symmetric macules and papules - Benign appearing on exam today - Observation - Call clinic for new or changing moles -  Recommend daily use of broad spectrum spf 30+ sunscreen to sun-exposed areas.   Hemangiomas - Red papules - Discussed benign nature - Observe - Call for any changes  Actinic Damage - diffuse scaly erythematous macules with underlying dyspigmentation - Recommend daily broad spectrum sunscreen SPF 30+ to sun-exposed areas, reapply every 2 hours as needed.  - Call for new or changing lesions.  Varicose Veins - Dilated blue, purple or red veins at the lower extremities - Reassured - These can be treated by sclerotherapy (a procedure to inject a medicine into the veins to make them disappear) if desired, but the treatment is not covered by insurance  Skin cancer screening performed today.  Return in about 1 year (around 11/23/2020) for TBSE.  Luther Redo, CMA, am acting as scribe for Sarina Ser, MD .  Documentation: I have reviewed the above documentation for accuracy and completeness, and I agree with the above.  Sarina Ser, MD

## 2019-11-27 ENCOUNTER — Encounter: Payer: Self-pay | Admitting: Dermatology

## 2020-01-02 ENCOUNTER — Other Ambulatory Visit: Payer: Self-pay | Admitting: Family Medicine

## 2020-01-15 ENCOUNTER — Other Ambulatory Visit: Payer: Self-pay | Admitting: Family Medicine

## 2020-01-17 NOTE — Telephone Encounter (Signed)
CPE scheduled 02/08/20, last filled on 01/18/19 #90 tabs with 2 refills, please advise

## 2020-01-19 ENCOUNTER — Other Ambulatory Visit: Payer: Medicare Other

## 2020-01-19 ENCOUNTER — Ambulatory Visit: Payer: Medicare Other

## 2020-01-26 ENCOUNTER — Encounter: Payer: Medicare Other | Admitting: Family Medicine

## 2020-01-31 ENCOUNTER — Telehealth: Payer: Self-pay | Admitting: Family Medicine

## 2020-01-31 DIAGNOSIS — Z0000 Encounter for general adult medical examination without abnormal findings: Secondary | ICD-10-CM

## 2020-01-31 DIAGNOSIS — E7800 Pure hypercholesterolemia, unspecified: Secondary | ICD-10-CM

## 2020-01-31 NOTE — Telephone Encounter (Signed)
-----   Message from Cloyd Stagers, RT sent at 01/18/2020  1:09 PM EDT ----- Regarding: Lab Orders for Tuesday 9.28.2021 Please place lab orders for Tuesday 9.28.2021, office visit for physical on Tuesday 10.5.2021 Thank you, Dyke Maes RT(R)

## 2020-02-01 ENCOUNTER — Other Ambulatory Visit: Payer: Self-pay

## 2020-02-01 ENCOUNTER — Ambulatory Visit (INDEPENDENT_AMBULATORY_CARE_PROVIDER_SITE_OTHER): Payer: Medicare PPO

## 2020-02-01 ENCOUNTER — Other Ambulatory Visit (INDEPENDENT_AMBULATORY_CARE_PROVIDER_SITE_OTHER): Payer: Medicare PPO

## 2020-02-01 DIAGNOSIS — Z0000 Encounter for general adult medical examination without abnormal findings: Secondary | ICD-10-CM | POA: Diagnosis not present

## 2020-02-01 DIAGNOSIS — E7800 Pure hypercholesterolemia, unspecified: Secondary | ICD-10-CM

## 2020-02-01 LAB — COMPREHENSIVE METABOLIC PANEL
ALT: 24 U/L (ref 0–35)
AST: 22 U/L (ref 0–37)
Albumin: 4.3 g/dL (ref 3.5–5.2)
Alkaline Phosphatase: 73 U/L (ref 39–117)
BUN: 16 mg/dL (ref 6–23)
CO2: 29 mEq/L (ref 19–32)
Calcium: 9.8 mg/dL (ref 8.4–10.5)
Chloride: 105 mEq/L (ref 96–112)
Creatinine, Ser: 0.69 mg/dL (ref 0.40–1.20)
GFR: 85.05 mL/min (ref >60.00)
Glucose, Bld: 99 mg/dL (ref 70–99)
Potassium: 4.3 mEq/L (ref 3.5–5.1)
Sodium: 142 mEq/L (ref 135–145)
Total Bilirubin: 0.3 mg/dL (ref 0.2–1.2)
Total Protein: 7.1 g/dL (ref 6.0–8.3)

## 2020-02-01 LAB — CBC WITH DIFFERENTIAL/PLATELET
Basophils Absolute: 0 10*3/uL (ref 0.0–0.1)
Basophils Relative: 0.5 % (ref 0.0–3.0)
Eosinophils Absolute: 0.2 10*3/uL (ref 0.0–0.7)
Eosinophils Relative: 3 % (ref 0.0–5.0)
HCT: 36.3 % (ref 36.0–46.0)
Hemoglobin: 12.1 g/dL (ref 12.0–15.0)
Lymphocytes Relative: 25.3 % (ref 12.0–46.0)
Lymphs Abs: 2 10*3/uL (ref 0.7–4.0)
MCHC: 33.2 g/dL (ref 30.0–36.0)
MCV: 94.8 fl (ref 78.0–100.0)
Monocytes Absolute: 0.8 10*3/uL (ref 0.1–1.0)
Monocytes Relative: 10.5 % (ref 3.0–12.0)
Neutro Abs: 4.9 10*3/uL (ref 1.4–7.7)
Neutrophils Relative %: 60.7 % (ref 43.0–77.0)
Platelets: 336 10*3/uL (ref 150.0–400.0)
RBC: 3.83 Mil/uL — ABNORMAL LOW (ref 3.87–5.11)
RDW: 14 % (ref 11.5–15.5)
WBC: 8.1 10*3/uL (ref 4.0–10.5)

## 2020-02-01 LAB — LIPID PANEL
Cholesterol: 186 mg/dL (ref 0–200)
HDL: 73.3 mg/dL (ref >39.00)
LDL Cholesterol: 93 mg/dL (ref 0–99)
NonHDL: 112.99
Total CHOL/HDL Ratio: 3
Triglycerides: 98 mg/dL (ref 0.0–149.0)
VLDL: 19.6 mg/dL (ref 0.0–40.0)

## 2020-02-01 LAB — TSH: TSH: 3.41 u[IU]/mL (ref 0.35–4.50)

## 2020-02-01 NOTE — Progress Notes (Signed)
Subjective:   Megan Harrison is a 64 y.o. female who presents for an Initial Medicare Annual Wellness Visit.  Review of Systems: N/A     I connected with the patient today by telephone and verified that I am speaking with the correct person using two identifiers. Location patient: home Location nurse: work Persons participating in the telephone visit: patient, nurse.   I discussed the limitations, risks, security and privacy concerns of performing an evaluation and management service by telephone and the availability of in person appointments. I also discussed with the patient that there may be a patient responsible charge related to this service. The patient expressed understanding and verbally consented to this telephonic visit.        Cardiac Risk Factors include: advanced age (>31men, >56 women);Other (see comment), Risk factor comments: hyperlipidemia     Objective:    Today's Vitals   There is no height or weight on file to calculate BMI.  Advanced Directives 02/01/2020 02/18/2016  Does Patient Have a Medical Advance Directive? Yes No  Type of Paramedic of [place];Living will -  Copy of [place] in Chart? No - copy requested -  Would patient like information on creating a medical advance directive? - No - patient declined information    Current Medications (verified) Outpatient Encounter Medications as of 02/01/2020  Medication Sig  . aspirin 81 MG tablet Take 81 mg by mouth daily.   Marland Kitchen atorvastatin (LIPITOR) 10 MG tablet Take 1 tablet by mouth once daily  . Calcium Carbonate (CALCIUM 500 PO) Take 1 capsule by mouth daily.    . ferrous sulfate 325 (65 FE) MG tablet Take 325 mg by mouth daily.  . fluticasone (FLONASE) 50 MCG/ACT nasal spray USE ONE SPRAY(S) IN EACH NOSTRIL ONCE DAILY AS DIRECTED  . meloxicam (MOBIC) 15 MG tablet TAKE 1 TABLET BY MOUTH ONCE DAILY AS NEEDED FOR PAIN WITH FOOD  . Multiple Vitamins-Minerals  (ONE-A-DAY WOMENS 50 PLUS PO) Take 1 tablet by mouth daily.    . prednisoLONE acetate (PRED FORTE) 1 % ophthalmic suspension Place 1 drop into the left eye 3 (three) times daily.   . Timolol Maleate 0.5 % (DAILY) SOLN   . cyclobenzaprine (FLEXERIL) 10 MG tablet Take 1 tablet (10 mg total) by mouth 3 (three) times daily as needed for muscle spasms.   No facility-administered encounter medications on file as of 02/01/2020.    Allergies (verified) Codeine and Sumatriptan   History: Past Medical History:  Diagnosis Date  . Anemia   . Cutaneous sarcoidosis   . Degenerative disc disease   . Dysplastic nevus 11/19/2017   L mid back 6.0 cm lat to spine  . Foot pain   . Hyperlipidemia   . Migraine   . Zoster    Opthy (regular checks)   Past Surgical History:  Procedure Laterality Date  . 2D Echo  1999   Negative  . ABDOMINAL HYSTERECTOMY  11/99  . APPENDECTOMY    . Arm lesion, cutaneous sarcoid  12/2005   with normal CXR and ANA  . CESAREAN SECTION     two times  . COLONOSCOPY  11/0/2009,08/18/13   diverticulosis and hemorrhoids  . DIAGNOSTIC LAPAROSCOPY    . EYE SURGERY  10/13/2018   had shingles in left eye, had to have surgery, has contact lens in now.  Marland Kitchen LAPAROSCOPIC APPENDECTOMY N/A 02/18/2016   Procedure: APPENDECTOMY LAPAROSCOPIC;  Surgeon: Ralene Ok, MD;  Location: [facility];  Service:  General;  Laterality: N/A;  . LUMBAR LAMINECTOMY  10/2017  . OOPHORECTOMY    . ROTATOR CUFF REPAIR    . TONSILLECTOMY    . UPPER GASTROINTESTINAL ENDOSCOPY  11/15/2010   non-erosive gastritis, H. pylori negative   Family History  Problem Relation Age of Onset  . Colon cancer Brother        early 68's  . Stomach cancer Brother   . Heart disease Mother        CAD  . Alcohol abuse Mother   . Lung cancer Father   . Heart disease Brother        CAD  . Throat cancer Brother   . Heart disease Brother        CAD, pre diabetic, obese  . Colon polyps Sister   . Breast cancer Neg Hx     . Rectal cancer Neg Hx    Social History   Socioeconomic History  . Marital status: Married    Spouse name: Not on file  . Number of children: Not on file  . Years of education: Not on file  . Highest education level: Not on file  Occupational History  . Occupation: Works for [employer]    Employer: RETIRED  Tobacco Use  . Smoking status: Never Smoker  . Smokeless tobacco: Never Used  Vaping Use  . Vaping Use: Never used  Substance and Sexual Activity  . Alcohol use: No    Alcohol/week: 0.0 standard drinks  . Drug use: No  . Sexual activity: Yes  Other Topics Concern  . Not on file  Social History Narrative   Regular exercise:  Curves, elliptical, walking.   Social Determinants of Health   Financial Resource Strain: Low Risk   . Difficulty of Paying Living Expenses: Not hard at all  Food Insecurity: No Food Insecurity  . Worried About Charity fundraiser in the Last Year: Never true  . Ran Out of Food in the Last Year: Never true  Transportation Needs: No Transportation Needs  . Lack of Transportation (Medical): No  . Lack of Transportation (Non-Medical): No  Physical Activity: Insufficiently Active  . Days of Exercise per Week: 4 days  . Minutes of Exercise per Session: 30 min  Stress: No Stress Concern Present  . Feeling of Stress : Not at all  Social Connections:   . Frequency of Communication with Friends and Family: Not on file  . Frequency of Social Gatherings with Friends and Family: Not on file  . Attends Religious Services: Not on file  . Active Member of Clubs or Organizations: Not on file  . Attends Archivist Meetings: Not on file  . Marital Status: Not on file    Tobacco Counseling Counseling given: Not Answered   Clinical Intake:  Pre-visit preparation completed: Yes  Pain : No/denies pain     Nutritional Risks: None Diabetes: No  How often do you need to have someone help you when you read instructions, pamphlets, or  other written materials from your doctor or pharmacy?: 1 - Never What is the last grade level you completed in school?: associate degree in early childhood education  Diabetic: No Nutrition Risk Assessment:  Has the patient had any N/V/D within the last 2 months?  No  Does the patient have any non-healing wounds?  No  Has the patient had any unintentional weight loss or weight gain?  No   Diabetes:  Is the patient diabetic?  No  If diabetic, was  a CBG obtained today?  N/A Did the patient bring in their glucometer from home?  N/A How often do you monitor your CBG's? N/A.   Financial Strains and Diabetes Management:  Are you having any financial strains with the device, your supplies or your medication? N/A.  Does the patient want to be seen by Chronic Care Management for management of their diabetes?  N/A Would the patient like to be referred to a Nutritionist or for Diabetic Management?  N/A     Interpreter Needed?: No  Information entered by :: CJohnson, LPN   Activities of Daily Living In your present state of health, do you have any difficulty performing the following activities: 02/01/2020  Hearing? N  Vision? N  Difficulty concentrating or making decisions? N  Walking or climbing stairs? N  Dressing or bathing? N  Doing errands, shopping? N  Preparing Food and eating ? N  Using the Toilet? N  In the past six months, have you accidently leaked urine? N  Do you have problems with loss of bowel control? N  Managing your Medications? N  Managing your Finances? N  Housekeeping or managing your Housekeeping? N  Some recent data might be hidden    Patient Care Team: Tower, Wynelle Fanny, MD as PCP - General  Indicate any recent Medical Services you may have received from other than [facility] providers in the past year (date may be approximate).     Assessment:   This is a routine wellness examination for [place].  Hearing/Vision screen  Hearing Screening   125Hz  250Hz   500Hz  1000Hz  2000Hz  3000Hz  4000Hz  6000Hz  8000Hz   Right ear:           Left ear:           Vision Screening Comments: Patient gets annual eye exams   Dietary issues and exercise activities discussed: Current Exercise Habits: Home exercise routine, Type of exercise: walking, Time (Minutes): 30, Frequency (Times/Week): 4, Weekly Exercise (Minutes/Week): 120, Intensity: Moderate, Exercise limited by: None identified  Goals    . Patient Stated     02/01/2020, I will continue to walk 3-4 days a week for 30 minutes.      Depression Screen PHQ 2/9 Scores 02/01/2020 01/18/2019 01/12/2018 12/10/2016 10/19/2012  PHQ - 2 Score 0 0 0 0 0  PHQ- 9 Score 0 - - 0 -    Fall Risk Fall Risk  02/01/2020 01/18/2019 12/10/2016  Falls in the past year? 0 0 No  Number falls in past yr: 0 0 -  Injury with Fall? 0 0 -  Risk for fall due to : No Fall Risks - -  Follow up Falls evaluation completed;Falls prevention discussed Falls evaluation completed -    Any stairs in or around the home? Yes  If so, are there any without handrails? No  Home free of loose throw rugs in walkways, pet beds, electrical cords, etc? Yes  Adequate lighting in your home to reduce risk of falls? Yes   ASSISTIVE DEVICES UTILIZED TO PREVENT FALLS:  Life alert? No  Use of a cane, walker or w/c? No  Grab bars in the bathroom? No  Shower chair or bench in shower? No  Elevated toilet seat or a handicapped toilet? No   TIMED UP AND GO:  Was the test performed? N/A, telephonic visit .    Cognitive Function: MMSE - Mini Mental State Exam 02/01/2020  Orientation to time 5  Orientation to Place 5  Registration 3  Attention/ Calculation 5  Recall 3  Language- repeat 1       Mini Cog  Mini-Cog screen was completed. Maximum score is 22. A value of 0 denotes this part of the MMSE was not completed or the patient failed this part of the Mini-Cog screening.  Immunizations Immunization History  Administered Date(s) Administered  .  Fluad Quad(high Dose 65+) 01/18/2019  . Influenza Split 02/19/2011  . Influenza Whole 04/09/2006, 02/13/2008, 01/19/2009, 02/02/2010  . Influenza,inj,Quad PF,6+ Mos 01/02/2017, 01/04/2018  . Influenza-Unspecified 01/05/2012, 01/13/2013, 02/04/2015, 01/09/2016  . PFIZER SARS-COV-2 Vaccination 07/09/2019, 07/30/2019  . Pneumococcal Conjugate-13 01/18/2019  . Td 08/30/2004  . Tdap 11/04/2014  . Zoster 11/04/2013  . Zoster Recombinat (Shingrix) 05/21/2019    TDAP status: Up to date Flu Vaccine status: due, will get at upcoming office visit  Pneumococcal vaccine status: due, will get at upcoming office visit  Covid-19 vaccine status: Completed vaccines  Qualifies for Shingles Vaccine? Yes   Zostavax completed Yes   Shingrix Completed?: No.  Due for second dose and will receive this soon per patient.   Screening Tests Health Maintenance  Topic Date Due  . INFLUENZA VACCINE  12/05/2019  . PNA vac Low Risk Adult (2 of 2 - PPSV23) 01/18/2020  . Hepatitis C Screening  06/08/2023 (Originally 1955/06/20)  . MAMMOGRAM  07/07/2021  . COLONOSCOPY  10/21/2023  . TETANUS/TDAP  11/03/2024  . DEXA SCAN  Completed  . COVID-19 Vaccine  Completed    Health Maintenance  Health Maintenance Due  Topic Date Due  . INFLUENZA VACCINE  12/05/2019  . PNA vac Low Risk Adult (2 of 2 - PPSV23) 01/18/2020    Colorectal cancer screening: Completed 10/21/2018. Repeat every 5 years Mammogram status: Completed 07/08/2019. Repeat every year Bone Density status: Completed 01/28/2019. Results reflect: Bone density results: OSTEOPENIA. Repeat every 2 years.  Lung Cancer Screening: (Low Dose CT Chest recommended if Age 67-80 years, 30 pack-year currently smoking OR have quit w/in 15 years.) does not qualify.    Additional Screening:  Hepatitis C Screening: does qualify; Completed declined  Vision Screening: Recommended annual ophthalmology exams for early detection of glaucoma and other disorders of the  eye. Is the patient up to date with their annual eye exam?  Yes  Who is the provider or what is the name of the office in which the patient attends annual eye exams? Dr. Remo Lipps [place] If pt is not established with a provider, would they like to be referred to a provider to establish care? No .   Dental Screening: Recommended annual dental exams for proper oral hygiene  Community Resource Referral / Chronic Care Management: CRR required this visit?  No   CCM required this visit?  No      Plan:     I have personally reviewed and noted the following in the patient's chart:   . Medical and social history . Use of alcohol, tobacco or illicit drugs  . Current medications and supplements . Functional ability and status . Nutritional status . Physical activity . Advanced directives . List of other physicians . Hospitalizations, surgeries, and ER visits in previous 12 months . Vitals . Screenings to include cognitive, depression, and falls . Referrals and appointments  In addition, I have reviewed and discussed with patient certain preventive protocols, quality metrics, and best practice recommendations. A written personalized care plan for preventive services as well as general preventive health recommendations were provided to patient.   Due to this being a telephonic visit, the after visit  summary with patients personalized plan was offered to patient via office or my-chart. Patient preferred to pick up at office at next visit and via mychart.   Andrez Grime, LPN   0/23/3435

## 2020-02-01 NOTE — Patient Instructions (Signed)
Megan Harrison , Thank you for taking time to come for your Medicare Wellness Visit. I appreciate your ongoing commitment to your health goals. Please review the following plan we discussed and let me know if I can assist you in the future.   Screening recommendations/referrals: Colonoscopy: Up to date, completed 10/21/2018, due 10/2023 Mammogram: Up to date, completed 07/08/2019, due 07/2020 Bone Density: Up to date, completed 01/28/2019, due 01/2021 Recommended yearly ophthalmology/optometry visit for glaucoma screening and checkup Recommended yearly dental visit for hygiene and checkup  Vaccinations: Influenza vaccine: due, will get at upcoming office visit  Pneumococcal vaccine: due, will get at upcoming office visit  Tdap vaccine: Up to date, completed 11/04/2014, due 11/2024 Shingles vaccine: Up to date, completed 05/21/2019, will get second dose soon you stated. Covid-19:Completed series  Advanced directives: Please bring a copy of your POA (Power of Attorney) and/or Living Will to your next appointment.   Conditions/risks identified: hyperlipidemia  Next appointment: Follow up in one year for your annual wellness visit    Preventive Care 65 Years and Older, Female Preventive care refers to lifestyle choices and visits with your health care provider that can promote health and wellness. What does preventive care include?  A yearly physical exam. This is also called an annual well check.  Dental exams once or twice a year.  Routine eye exams. Ask your health care provider how often you should have your eyes checked.  Personal lifestyle choices, including:  Daily care of your teeth and gums.  Regular physical activity.  Eating a healthy diet.  Avoiding tobacco and drug use.  Limiting alcohol use.  Practicing safe sex.  Taking low-dose aspirin every day.  Taking vitamin and mineral supplements as recommended by your health care provider. What happens during an annual well  check? The services and screenings done by your health care provider during your annual well check will depend on your age, overall health, lifestyle risk factors, and family history of disease. Counseling  Your health care provider may ask you questions about your:  Alcohol use.  Tobacco use.  Drug use.  Emotional well-being.  Home and relationship well-being.  Sexual activity.  Eating habits.  History of falls.  Memory and ability to understand (cognition).  Work and work Statistician.  Reproductive health. Screening  You may have the following tests or measurements:  Height, weight, and BMI.  Blood pressure.  Lipid and cholesterol levels. These may be checked every 5 years, or more frequently if you are over 46 years old.  Skin check.  Lung cancer screening. You may have this screening every year starting at age 83 if you have a 30-pack-year history of smoking and currently smoke or have quit within the past 15 years.  Fecal occult blood test (FOBT) of the stool. You may have this test every year starting at age 69.  Flexible sigmoidoscopy or colonoscopy. You may have a sigmoidoscopy every 5 years or a colonoscopy every 10 years starting at age 52.  Hepatitis C blood test.  Hepatitis B blood test.  Sexually transmitted disease (STD) testing.  Diabetes screening. This is done by checking your blood sugar (glucose) after you have not eaten for a while (fasting). You may have this done every 1-3 years.  Bone density scan. This is done to screen for osteoporosis. You may have this done starting at age 66.  Mammogram. This may be done every 1-2 years. Talk to your health care provider about how often you should have regular  mammograms. Talk with your health care provider about your test results, treatment options, and if necessary, the need for more tests. Vaccines  Your health care provider may recommend certain vaccines, such as:  Influenza vaccine. This is  recommended every year.  Tetanus, diphtheria, and acellular pertussis (Tdap, Td) vaccine. You may need a Td booster every 10 years.  Zoster vaccine. You may need this after age 62.  Pneumococcal 13-valent conjugate (PCV13) vaccine. One dose is recommended after age 65.  Pneumococcal polysaccharide (PPSV23) vaccine. One dose is recommended after age 82. Talk to your health care provider about which screenings and vaccines you need and how often you need them. This information is not intended to replace advice given to you by your health care provider. Make sure you discuss any questions you have with your health care provider. Document Released: 05/19/2015 Document Revised: 01/10/2016 Document Reviewed: 02/21/2015 Elsevier Interactive Patient Education  2017 [place] Prevention in the Home Falls can cause injuries. They can happen to people of all ages. There are many things you can do to make your home safe and to help prevent falls. What can I do on the outside of my home?  Regularly fix the edges of walkways and driveways and fix any cracks.  Remove anything that might make you trip as you walk through a door, such as a raised step or threshold.  Trim any bushes or trees on the path to your home.  Use bright outdoor lighting.  Clear any walking paths of anything that might make someone trip, such as rocks or tools.  Regularly check to see if handrails are loose or broken. Make sure that both sides of any steps have handrails.  Any raised decks and porches should have guardrails on the edges.  Have any leaves, snow, or ice cleared regularly.  Use sand or salt on walking paths during winter.  Clean up any spills in your garage right away. This includes oil or grease spills. What can I do in the bathroom?  Use night lights.  Install grab bars by the toilet and in the tub and shower. Do not use towel bars as grab bars.  Use non-skid mats or decals in the tub or  shower.  If you need to sit down in the shower, use a plastic, non-slip stool.  Keep the floor dry. Clean up any water that spills on the floor as soon as it happens.  Remove soap buildup in the tub or shower regularly.  Attach bath mats securely with double-sided non-slip rug tape.  Do not have throw rugs and other things on the floor that can make you trip. What can I do in the bedroom?  Use night lights.  Make sure that you have a light by your bed that is easy to reach.  Do not use any sheets or blankets that are too big for your bed. They should not hang down onto the floor.  Have a firm chair that has side arms. You can use this for support while you get dressed.  Do not have throw rugs and other things on the floor that can make you trip. What can I do in the kitchen?  Clean up any spills right away.  Avoid walking on wet floors.  Keep items that you use a lot in easy-to-reach places.  If you need to reach something above you, use a strong step stool that has a grab bar.  Keep electrical cords out of the way.  Do not use floor polish or wax that makes floors slippery. If you must use wax, use non-skid floor wax.  Do not have throw rugs and other things on the floor that can make you trip. What can I do with my stairs?  Do not leave any items on the stairs.  Make sure that there are handrails on both sides of the stairs and use them. Fix handrails that are broken or loose. Make sure that handrails are as long as the stairways.  Check any carpeting to make sure that it is firmly attached to the stairs. Fix any carpet that is loose or worn.  Avoid having throw rugs at the top or bottom of the stairs. If you do have throw rugs, attach them to the floor with carpet tape.  Make sure that you have a light switch at the top of the stairs and the bottom of the stairs. If you do not have them, ask someone to add them for you. What else can I do to help prevent  falls?  Wear shoes that:  Do not have high heels.  Have rubber bottoms.  Are comfortable and fit you well.  Are closed at the toe. Do not wear sandals.  If you use a stepladder:  Make sure that it is fully opened. Do not climb a closed stepladder.  Make sure that both sides of the stepladder are locked into place.  Ask someone to hold it for you, if possible.  Clearly mark and make sure that you can see:  Any grab bars or handrails.  First and last steps.  Where the edge of each step is.  Use tools that help you move around (mobility aids) if they are needed. These include:  Canes.  Walkers.  Scooters.  Crutches.  Turn on the lights when you go into a dark area. Replace any light bulbs as soon as they burn out.  Set up your furniture so you have a clear path. Avoid moving your furniture around.  If any of your floors are uneven, fix them.  If there are any pets around you, be aware of where they are.  Review your medicines with your doctor. Some medicines can make you feel dizzy. This can increase your chance of falling. Ask your doctor what other things that you can do to help prevent falls. This information is not intended to replace advice given to you by your health care provider. Make sure you discuss any questions you have with your health care provider. Document Released: 02/16/2009 Document Revised: 09/28/2015 Document Reviewed: 05/27/2014 Elsevier Interactive Patient Education  2017 [employer].

## 2020-02-01 NOTE — Progress Notes (Signed)
PCP notes:  Health Maintenance: Flu- due Pneumococcal 23- due   Abnormal Screenings: none   Patient concerns: Constipation issues   Nurse concerns: None   Next PCP appt.: 02/08/2020  @ 10:30 am

## 2020-02-08 ENCOUNTER — Other Ambulatory Visit: Payer: Self-pay

## 2020-02-08 ENCOUNTER — Ambulatory Visit (INDEPENDENT_AMBULATORY_CARE_PROVIDER_SITE_OTHER): Payer: Medicare PPO | Admitting: Family Medicine

## 2020-02-08 ENCOUNTER — Encounter: Payer: Self-pay | Admitting: Family Medicine

## 2020-02-08 VITALS — BP 130/72 | HR 69 | Temp 97.1°F | Ht 63.25 in | Wt 167.0 lb

## 2020-02-08 DIAGNOSIS — M85859 Other specified disorders of bone density and structure, unspecified thigh: Secondary | ICD-10-CM

## 2020-02-08 DIAGNOSIS — E7800 Pure hypercholesterolemia, unspecified: Secondary | ICD-10-CM

## 2020-02-08 DIAGNOSIS — Z23 Encounter for immunization: Secondary | ICD-10-CM

## 2020-02-08 DIAGNOSIS — Z0000 Encounter for general adult medical examination without abnormal findings: Secondary | ICD-10-CM | POA: Diagnosis not present

## 2020-02-08 MED ORDER — ATORVASTATIN CALCIUM 10 MG PO TABS
10.0000 mg | ORAL_TABLET | Freq: Every day | ORAL | 3 refills | Status: DC
Start: 2020-02-08 — End: 2021-02-15

## 2020-02-08 MED ORDER — FLUTICASONE PROPIONATE 50 MCG/ACT NA SUSP
NASAL | 3 refills | Status: DC
Start: 2020-02-08 — End: 2021-02-15

## 2020-02-08 MED ORDER — MELOXICAM 15 MG PO TABS: 15.0000 mg | ORAL_TABLET | Freq: Every day | ORAL | 3 refills | Status: DC | PRN

## 2020-02-08 NOTE — Assessment & Plan Note (Signed)
Disc goals for lipids and reasons to control them Rev last labs with pt Rev low sat fat diet in detail  HDL is up  Will continue atorvasatin and diet

## 2020-02-08 NOTE — Patient Instructions (Addendum)
Flu and pneumonia vaccines today  Take care of yourself  Get to your regular diet and exercise   Stool softeners and miralax are fine for constipation  Stop the calcium for a week or two and just take vitamin D 2000 iu daily or more   If improved you may need to just get calcium from diet instead of the supplement   If constipation worsens let us know

## 2020-02-08 NOTE — Assessment & Plan Note (Signed)
dexa 9/20 osteopenia of FN No falls or fx  Taking ca and D (may have to stop ca due to constipation) Walking regularly for exercise  Re check 2022

## 2020-02-08 NOTE — Progress Notes (Signed)
Subjective:    Patient ID: Megan Harrison, female    DOB: 03/01/1956, 64 y.o.   MRN: [id number]  This visit occurred during the SARS-CoV-2 public health emergency.  Safety protocols were in place, including screening questions prior to the visit, additional usage of staff PPE, and extensive cleaning of exam room while observing appropriate contact time as indicated for disinfecting solutions.    HPI Here for health maintenance exam and to review chronic medical problems   Wt Readings from Last 3 Encounters:  02/08/20 167 lb (75.8 kg)  05/27/19 171 lb 8 oz (77.8 kg)  01/18/19 167 lb 8 oz (76 kg)  just came back from vacation  29.35 kg/m Doing well overall  Just came back from the [place] for vacation-it was great   Had amw on 9/28 Noted flu and pna vaccines due  Pfizer vaccines for covid in march-plans on a booster   Flu vaccine given today  pna 23 -due for   Has to get the 2nd shingrix  First one gave her side effects    Mammogram 3/21 Self breast exam-no lumps   Colonoscopy 6/20  Brother had colon cancer   Tdap 7/16  dexa 9/20 -osteopenia of [place]- none fx -none  Supplements - vit D and ca  Exercise - walking regularly   BP Readings from Last 3 Encounters:  02/08/20 (!) 148/68  05/27/19 132/80  01/18/19 122/80    Pulse Readings from Last 3 Encounters:  02/08/20 69  05/27/19 78  01/18/19 70   Hyperlipidemia Lab Results  Component Value Date   CHOL 186 02/01/2020   CHOL 156 01/12/2019   CHOL 160 02/23/2018   Lab Results  Component Value Date   HDL 73.30 02/01/2020   HDL 61.40 01/12/2019   HDL 69.50 02/23/2018   Lab Results  Component Value Date   LDLCALC 93 02/01/2020   LDLCALC 85 01/12/2019   LDLCALC 81 02/23/2018   Lab Results  Component Value Date   TRIG 98.0 02/01/2020   TRIG 49.0 01/12/2019   TRIG 47.0 02/23/2018   Lab Results  Component Value Date   CHOLHDL 3 02/01/2020   CHOLHDL 3 01/12/2019   CHOLHDL 2 02/23/2018     Lab Results  Component Value Date   LDLDIRECT 102.9 06/10/2011  atorvastatin 10 mg and diet  Fairly stable, HDL is up   Other labs Results for orders placed or performed in visit on 02/01/20  TSH  Result Value Ref Range   TSH 3.41 0.35 - 4.50 uIU/mL  Lipid panel  Result Value Ref Range   Cholesterol 186 0 - 200 mg/dL   Triglycerides 98.0 0 - 149 mg/dL   HDL 73.30 >39.00 mg/dL   VLDL 19.6 0.0 - 40.0 mg/dL   LDL Cholesterol 93 0 - 99 mg/dL   Total CHOL/HDL Ratio 3    NonHDL 112.99   Comprehensive metabolic panel  Result Value Ref Range   Sodium 142 135 - 145 mEq/L   Potassium 4.3 3.5 - 5.1 mEq/L   Chloride 105 96 - 112 mEq/L   CO2 29 19 - 32 mEq/L   Glucose, Bld 99 70 - 99 mg/dL   BUN 16 6 - 23 mg/dL   Creatinine, Ser 0.69 0.40 - 1.20 mg/dL   Total Bilirubin 0.3 0.2 - 1.2 mg/dL   Alkaline Phosphatase 73 39 - 117 U/L   AST 22 0 - 37 U/L   ALT 24 0 - 35 U/L   Total Protein 7.1  6.0 - 8.3 g/dL   Albumin 4.3 3.5 - 5.2 g/dL   GFR 85.05 >60.00 mL/min   Calcium 9.8 8.4 - 10.5 mg/dL  CBC with Differential/Platelet  Result Value Ref Range   WBC 8.1 4.0 - 10.5 K/uL   RBC 3.83 (L) 3.87 - 5.11 Mil/uL   Hemoglobin 12.1 12.0 - 15.0 g/dL   HCT 36.3 36 - 46 %   MCV 94.8 78.0 - 100.0 fl   MCHC 33.2 30.0 - 36.0 g/dL   RDW 14.0 11.5 - 15.5 %   Platelets 336.0 150 - 400 K/uL   Neutrophils Relative % 60.7 43 - 77 %   Lymphocytes Relative 25.3 12 - 46 %   Monocytes Relative 10.5 3 - 12 %   Eosinophils Relative 3.0 0 - 5 %   Basophils Relative 0.5 0 - 3 %   Neutro Abs 4.9 1.4 - 7.7 K/uL   Lymphs Abs 2.0 0.7 - 4.0 K/uL   Monocytes Absolute 0.8 0 - 1 K/uL   Eosinophils Absolute 0.2 0 - 0 K/uL   Basophils Absolute 0.0 0 - 0 K/uL    Patient Active Problem List   Diagnosis Date Noted   Thoracic myofascial strain 05/27/2019   Osteopenia 01/31/2019   Estrogen deficiency 01/18/2019   Welcome to Medicare preventive visit 01/11/2019   Family history of colon cancer - brother in  early 60's 10/21/2018   Encounter for screening mammogram for breast cancer 05/19/2017   Hyperlipidemia 12/10/2016   Neck pain 04/05/2016   Encounter for routine gynecological examination 11/04/2014   Hemorrhoids, internal 04/02/2013   Routine general medical examination at a health care facility 06/09/2011   ALLERGIC RHINITIS 03/10/2009   History of shingles 08/28/2006   Past Medical History:  Diagnosis Date   Anemia    Cutaneous sarcoidosis    Degenerative disc disease    Dysplastic nevus 11/19/2017   L mid back 6.0 cm lat to spine   Foot pain    Hyperlipidemia    Migraine    Zoster    Opthy (regular checks)   Past Surgical History:  Procedure Laterality Date   2D Echo  1999   Negative   ABDOMINAL HYSTERECTOMY  11/99   APPENDECTOMY     Arm lesion, cutaneous sarcoid  12/2005   with normal CXR and ANA   CESAREAN SECTION     two times   COLONOSCOPY  11/0/2009,08/18/13   diverticulosis and hemorrhoids   DIAGNOSTIC LAPAROSCOPY     EYE SURGERY  10/13/2018   had shingles in left eye, had to have surgery, has contact lens in now.   LAPAROSCOPIC APPENDECTOMY N/A 02/18/2016   Procedure: APPENDECTOMY LAPAROSCOPIC;  Surgeon: Ralene Ok, MD;  Location: [facility];  Service: General;  Laterality: N/A;   LUMBAR LAMINECTOMY  10/2017   OOPHORECTOMY     ROTATOR CUFF REPAIR     TONSILLECTOMY     UPPER GASTROINTESTINAL ENDOSCOPY  11/15/2010   non-erosive gastritis, H. pylori negative   Social History   Tobacco Use   Smoking status: Never Smoker   Smokeless tobacco: Never Used  Vaping Use   Vaping Use: Never used  Substance Use Topics   Alcohol use: No    Alcohol/week: 0.0 standard drinks   Drug use: No   Family History  Problem Relation Age of Onset   Colon cancer Brother        early 33's   Stomach cancer Brother    Heart disease Mother  CAD   Alcohol abuse Mother    Lung cancer Father    Heart disease Brother        CAD    Throat cancer Brother    Heart disease Brother        CAD, pre diabetic, obese   Colon polyps Sister    Breast cancer Neg Hx    Rectal cancer Neg Hx    Allergies  Allergen Reactions   Codeine     REACTION: syncope   Sumatriptan     REACTION: choking sensation   Current Outpatient Medications on File Prior to Visit  Medication Sig Dispense Refill   aspirin 81 MG tablet Take 81 mg by mouth daily.      Calcium Carbonate (CALCIUM 500 PO) Take 1 capsule by mouth daily.       ferrous sulfate 325 (65 FE) MG tablet Take 325 mg by mouth daily.     Multiple Vitamins-Minerals (ONE-A-DAY WOMENS 50 PLUS PO) Take 1 tablet by mouth daily.       prednisoLONE acetate (PRED FORTE) 1 % ophthalmic suspension Place 1 drop into the left eye 3 (three) times daily.      Timolol Maleate 0.5 % (DAILY) SOLN      No current facility-administered medications on file prior to visit.      Review of Systems  Constitutional: Negative for activity change, appetite change, fatigue, fever and unexpected weight change.  HENT: Negative for congestion, ear pain, rhinorrhea, sinus pressure and sore throat.   Eyes: Negative for pain, redness and visual disturbance.  Respiratory: Negative for cough, shortness of breath and wheezing.   Cardiovascular: Negative for chest pain and palpitations.  Gastrointestinal: Negative for abdominal pain, blood in stool, constipation and diarrhea.  Endocrine: Negative for polydipsia and polyuria.  Genitourinary: Negative for dysuria, frequency and urgency.  Musculoskeletal: Negative for arthralgias, back pain and myalgias.  Skin: Negative for pallor and rash.  Allergic/Immunologic: Negative for environmental allergies.  Neurological: Negative for dizziness, syncope and headaches.  Hematological: Negative for adenopathy. Does not bruise/bleed easily.  Psychiatric/Behavioral: Negative for decreased concentration and dysphoric mood. The patient is not nervous/anxious.          Objective:   Physical Exam Constitutional:      General: She is not in acute distress.    Appearance: Normal appearance. She is well-developed. She is obese. She is not ill-appearing or diaphoretic.  HENT:     Head: Normocephalic and atraumatic.     Right Ear: Tympanic membrane, ear canal and external ear normal.     Left Ear: Tympanic membrane, ear canal and external ear normal.     Nose: Nose normal. No congestion.     Mouth/Throat:     Mouth: Mucous membranes are moist.     Pharynx: Oropharynx is clear. No posterior oropharyngeal erythema.  Eyes:     General: No scleral icterus.    Extraocular Movements: Extraocular movements intact.     Conjunctiva/sclera: Conjunctivae normal.     Pupils: Pupils are equal, round, and reactive to light.  Neck:     Thyroid: No thyromegaly.     Vascular: No carotid bruit or JVD.  Cardiovascular:     Rate and Rhythm: Normal rate and regular rhythm.     Pulses: Normal pulses.     Heart sounds: Normal heart sounds. No gallop.   Pulmonary:     Effort: Pulmonary effort is normal. No respiratory distress.     Breath sounds: Normal breath sounds. No wheezing.  Comments: Good air exch Chest:     Chest wall: No tenderness.  Abdominal:     General: Bowel sounds are normal. There is no distension or abdominal bruit.     Palpations: Abdomen is soft. There is no mass.     Tenderness: There is no abdominal tenderness.     Hernia: No hernia is present.  Genitourinary:    Comments: Breast exam: No mass, nodules, thickening, tenderness, bulging, retraction, inflamation, nipple discharge or skin changes noted.  No axillary or clavicular LA.     Musculoskeletal:        General: No tenderness. Normal range of motion.     Cervical back: Normal range of motion and neck supple. No rigidity. No muscular tenderness.     Right lower leg: No edema.     Left lower leg: No edema.  Lymphadenopathy:     Cervical: No cervical adenopathy.  Skin:     General: Skin is warm and dry.     Coloration: Skin is not pale.     Findings: No erythema or rash.  Neurological:     Mental Status: She is alert. Mental status is at baseline.     Cranial Nerves: No cranial nerve deficit.     Motor: No abnormal muscle tone.     Coordination: Coordination normal.     Gait: Gait normal.     Deep Tendon Reflexes: Reflexes are normal and symmetric. Reflexes normal.  Psychiatric:        Mood and Affect: Mood normal.        Cognition and Memory: Cognition and memory normal.           Assessment & Plan:   Problem List Items Addressed This Visit      Musculoskeletal and Integument   Osteopenia    dexa 9/20 osteopenia of FN No falls or fx  Taking ca and D (may have to stop ca due to constipation) Walking regularly for exercise  Re check 2022        Other   Routine general medical examination at a health care facility - Primary    Reviewed health habits including diet and exercise and skin cancer prevention Reviewed appropriate screening tests for age  Also reviewed health mt list, fam hx and immunization status , as well as social and family history   See HPI amw reviewed  Labs reviewed  Flu and pna 23 vaccines today  Plans to get 2nd shingrix shot  covid immunized and plans on a booster        Hyperlipidemia    Disc goals for lipids and reasons to control them Rev last labs with pt Rev low sat fat diet in detail  HDL is up  Will continue atorvasatin and diet       Relevant Medications   atorvastatin (LIPITOR) 10 MG tablet    Other Visit Diagnoses    Need for influenza vaccination       Relevant Orders   Flu Vaccine QUAD High Dose(Fluad) (Completed)   Need for pneumococcal vaccination       Relevant Orders   Pneumococcal polysaccharide vaccine 23-valent greater than or equal to 2yo subcutaneous/IM (Completed)

## 2020-02-08 NOTE — Assessment & Plan Note (Signed)
Reviewed health habits including diet and exercise and skin cancer prevention Reviewed appropriate screening tests for age  Also reviewed health mt list, fam hx and immunization status , as well as social and family history   See HPI amw reviewed  Labs reviewed  Flu and pna 23 vaccines today  Plans to get 2nd shingrix shot  covid immunized and plans on a booster

## 2020-05-09 DIAGNOSIS — H4062X1 Glaucoma secondary to drugs, left eye, mild stage: Secondary | ICD-10-CM | POA: Diagnosis not present

## 2020-05-30 DIAGNOSIS — H4062X1 Glaucoma secondary to drugs, left eye, mild stage: Secondary | ICD-10-CM | POA: Diagnosis not present

## 2020-07-12 ENCOUNTER — Other Ambulatory Visit: Payer: Self-pay | Admitting: Family Medicine

## 2020-07-12 DIAGNOSIS — Z1231 Encounter for screening mammogram for malignant neoplasm of breast: Secondary | ICD-10-CM

## 2020-07-27 ENCOUNTER — Encounter: Payer: Self-pay | Admitting: Family Medicine

## 2020-07-27 ENCOUNTER — Other Ambulatory Visit: Payer: Self-pay | Admitting: Family Medicine

## 2020-07-27 ENCOUNTER — Other Ambulatory Visit (INDEPENDENT_AMBULATORY_CARE_PROVIDER_SITE_OTHER): Payer: Medicare PPO

## 2020-07-27 ENCOUNTER — Other Ambulatory Visit: Payer: Self-pay

## 2020-07-27 ENCOUNTER — Telehealth: Payer: Self-pay | Admitting: Family Medicine

## 2020-07-27 ENCOUNTER — Telehealth (INDEPENDENT_AMBULATORY_CARE_PROVIDER_SITE_OTHER): Payer: Medicare PPO | Admitting: Family Medicine

## 2020-07-27 DIAGNOSIS — J069 Acute upper respiratory infection, unspecified: Secondary | ICD-10-CM | POA: Insufficient documentation

## 2020-07-27 LAB — POC INFLUENZA A&B (BINAX/QUICKVUE)
Influenza A, POC: NEGATIVE
Influenza B, POC: NEGATIVE

## 2020-07-27 MED ORDER — BENZONATATE 200 MG PO CAPS: 200.0000 mg | ORAL_CAPSULE | Freq: Three times a day (TID) | ORAL | 1 refills | Status: DC | PRN

## 2020-07-27 MED ORDER — ALBUTEROL SULFATE HFA 108 (90 BASE) MCG/ACT IN AERS: 2.0000 | INHALATION_SPRAY | RESPIRATORY_TRACT | 0 refills | Status: DC | PRN

## 2020-07-27 NOTE — Patient Instructions (Signed)
Drink fluids and rest  mucinex DM is good for cough and congestion  (or day quil/nyquil) Nasal saline for congestion as needed  Tessalon for cough as needed, albuterol inhaler for wheezing- sent to your pharmacy Tylenol for fever or pain or headache  Please alert [place] if symptoms worsen (if severe or short of breath please go to the ER)   The office will call you to set up flu and covid testing this afternoon Continue to isolate yourself until we get results

## 2020-07-27 NOTE — Progress Notes (Signed)
Virtual Visit via Video Note  I connected with Megan Harrison on 07/27/20 at  9:00 AM EDT by a video enabled telemedicine application and verified that I am speaking with the correct person using two identifiers.  Location: Patient: home Provider: office   I discussed the limitations of evaluation and management by telemedicine and the availability of in person appointments. The patient expressed understanding and agreed to proceed.  Parties involved in encounter  Patient: Megan Harrison  Provider:  Loura Pardon MD   History of Present Illness: Pt presents with nasal congestion and ear fullness  Sunday started scratchy throat Fever/chills Monday some sweats  ST , congestion   A lot of cough- all night  A little phlegm Greenish/yellow mucous  Some headache-worse with cough , some sinus pressure also   Some wheezing  Hoarse voice (comes and goes)   Some loose stool yesterday once No n/v No appetite  Son has sinus and ear infection  (neg covid and flu test)   covid immunized with booster  Has not covid tested    otc nyquil  Tylenol  flonase   Does not have an inhaler   Patient Active Problem List   Diagnosis Date Noted  . Viral URI with cough 07/27/2020  . Thoracic myofascial strain 05/27/2019  . Osteopenia 01/31/2019  . Estrogen deficiency 01/18/2019  . Welcome to Medicare preventive visit 01/11/2019  . Family history of colon cancer - brother in early 17's 10/21/2018  . Encounter for screening mammogram for breast cancer 05/19/2017  . Hyperlipidemia 12/10/2016  . Neck pain 04/05/2016  . Encounter for routine gynecological examination 11/04/2014  . Hemorrhoids, internal 04/02/2013  . Routine general medical examination at a health care facility 06/09/2011  . ALLERGIC RHINITIS 03/10/2009  . History of shingles 08/28/2006   Past Medical History:  Diagnosis Date  . Anemia   . Cutaneous sarcoidosis   . Degenerative disc disease   . Dysplastic nevus  11/19/2017   L mid back 6.0 cm lat to spine  . Foot pain   . Hyperlipidemia   . Migraine   . Zoster    Opthy (regular checks)   Past Surgical History:  Procedure Laterality Date  . 2D Echo  1999   Negative  . ABDOMINAL HYSTERECTOMY  11/99  . APPENDECTOMY    . Arm lesion, cutaneous sarcoid  12/2005   with normal CXR and ANA  . CESAREAN SECTION     two times  . COLONOSCOPY  11/0/2009,08/18/13   diverticulosis and hemorrhoids  . DIAGNOSTIC LAPAROSCOPY    . EYE SURGERY  10/13/2018   had shingles in left eye, had to have surgery, has contact lens in now.  Marland Kitchen LAPAROSCOPIC APPENDECTOMY N/A 02/18/2016   Procedure: APPENDECTOMY LAPAROSCOPIC;  Surgeon: Ralene Ok, MD;  Location: [facility];  Service: General;  Laterality: N/A;  . LUMBAR LAMINECTOMY  10/2017  . OOPHORECTOMY    . ROTATOR CUFF REPAIR    . TONSILLECTOMY    . UPPER GASTROINTESTINAL ENDOSCOPY  11/15/2010   non-erosive gastritis, H. pylori negative   Social History   Tobacco Use  . Smoking status: Never Smoker  . Smokeless tobacco: Never Used  Vaping Use  . Vaping Use: Never used  Substance Use Topics  . Alcohol use: No    Alcohol/week: 0.0 standard drinks  . Drug use: No   Family History  Problem Relation Age of Onset  . Colon cancer Brother        early 19's  . Stomach  cancer Brother   . Heart disease Mother        CAD  . Alcohol abuse Mother   . Lung cancer Father   . Heart disease Brother        CAD  . Throat cancer Brother   . Heart disease Brother        CAD, pre diabetic, obese  . Colon polyps Sister   . Breast cancer Neg Hx   . Rectal cancer Neg Hx    Allergies  Allergen Reactions  . Codeine     REACTION: syncope  . Sumatriptan     REACTION: choking sensation   Current Outpatient Medications on File Prior to Visit  Medication Sig Dispense Refill  . aspirin 81 MG tablet Take 81 mg by mouth daily.     Marland Kitchen atorvastatin (LIPITOR) 10 MG tablet Take 1 tablet (10 mg total) by mouth daily. 90 tablet  3  . Cholecalciferol (VITAMIN D3) 50 MCG (2000 UT) TABS Take 1 tablet by mouth daily.    . ferrous sulfate 325 (65 FE) MG tablet Take 325 mg by mouth daily.    . fluticasone (FLONASE) 50 MCG/ACT nasal spray USE ONE SPRAY(S) IN EACH NOSTRIL ONCE DAILY AS DIRECTED 48 g 3  . meloxicam (MOBIC) 15 MG tablet Take 1 tablet (15 mg total) by mouth daily as needed for pain. 90 tablet 3  . Multiple Vitamins-Minerals (ONE-A-DAY WOMENS 50 PLUS PO) Take 1 tablet by mouth daily.    . prednisoLONE acetate (PRED FORTE) 1 % ophthalmic suspension Place 1 drop into the left eye every other day.    . Timolol Maleate 0.5 % (DAILY) SOLN      No current facility-administered medications on file prior to visit.   Review of Systems  Constitutional: Positive for chills, fever and malaise/fatigue.  HENT: Positive for congestion, ear pain and sore throat. Negative for sinus pain.   Eyes: Negative for blurred vision, discharge and redness.  Respiratory: Positive for cough, sputum production and wheezing. Negative for shortness of breath and stridor.   Cardiovascular: Negative for chest pain, palpitations and leg swelling.  Gastrointestinal: Negative for abdominal pain, diarrhea, nausea and vomiting.  Musculoskeletal: Negative for myalgias.  Skin: Negative for rash.  Neurological: Positive for headaches. Negative for dizziness.    Observations/Objective: Patient appears well, in no distress Weight is baseline  No facial swelling or asymmetry Voice is hoarse, pt clears throat frequently No obvious tremor or mobility impairment Moving neck and UEs normally Able to hear the call well  Cough sounds productive, no audible wheeze and not sob with conversation  Talkative and mentally sharp with no cognitive changes No skin changes on face or neck , no rash or pallor Affect is normal    Assessment and Plan: Problem List Items Addressed This Visit      Respiratory   Viral URI with cough    Testing set up for covid  and flu this afternoon Adv to isolate until then Fluids/rest Symptom care and ER prec discussed  Tessalon px for cough  Albuterol for wheeze (consider prednisone if worse)  Update if not starting to improve in a week or if worsening            Follow Up Instructions: Drink fluids and rest  mucinex DM is good for cough and congestion  (or day quil/nyquil) Nasal saline for congestion as needed  Tessalon for cough as needed, albuterol inhaler for wheezing- sent to your pharmacy Tylenol for fever or pain  or headache  Please alert [place] if symptoms worsen (if severe or short of breath please go to the ER)   The office will call you to set up flu and covid testing this afternoon Continue to isolate yourself until we get results    I discussed the assessment and treatment plan with the patient. The patient was provided an opportunity to ask questions and all were answered. The patient agreed with the plan and demonstrated an understanding of the instructions.   The patient was advised to call back or seek an in-person evaluation if the symptoms worsen or if the condition fails to improve as anticipated.     Loura Pardon, MD

## 2020-07-27 NOTE — Telephone Encounter (Signed)
Pt called in wanted to know about getting her prescription picked up. Looked at the prescriptions and she stated that she needed to get the liquid and not the pill  and it was suppose to be sent to [facility] [place] PH: [phone]

## 2020-07-27 NOTE — Assessment & Plan Note (Signed)
Testing set up for covid and flu this afternoon Adv to isolate until then Fluids/rest Symptom care and ER prec discussed  Tessalon px for cough  Albuterol for wheeze (consider prednisone if worse)  Update if not starting to improve in a week or if worsening

## 2020-07-27 NOTE — Telephone Encounter (Signed)
Error it was meant for another patient.

## 2020-07-27 NOTE — Addendum Note (Signed)
Addended by: Randall An on: 07/27/2020 03:24 PM   Modules accepted: Orders

## 2020-07-28 LAB — NOVEL CORONAVIRUS, NAA: SARS-CoV-2, NAA: NOT DETECTED

## 2020-07-28 LAB — SARS-COV-2, NAA 2 DAY TAT

## 2020-07-31 ENCOUNTER — Encounter: Payer: Self-pay | Admitting: Family Medicine

## 2020-07-31 DIAGNOSIS — J069 Acute upper respiratory infection, unspecified: Secondary | ICD-10-CM

## 2020-08-01 ENCOUNTER — Ambulatory Visit
Admission: RE | Admit: 2020-08-01 | Discharge: 2020-08-01 | Disposition: A | Discharge status: Home / Self Care | Payer: Medicare PPO | Source: Ambulatory Visit | Attending: Family Medicine | Admitting: Family Medicine

## 2020-08-01 ENCOUNTER — Ambulatory Visit
Admission: RE | Admit: 2020-08-01 | Discharge: 2020-08-01 | Disposition: A | Discharge status: Home / Self Care | Payer: Medicare PPO | Attending: Family Medicine | Admitting: Family Medicine

## 2020-08-01 DIAGNOSIS — R059 Cough, unspecified: Secondary | ICD-10-CM | POA: Diagnosis not present

## 2020-08-01 DIAGNOSIS — J069 Acute upper respiratory infection, unspecified: Secondary | ICD-10-CM | POA: Insufficient documentation

## 2020-08-01 NOTE — Telephone Encounter (Signed)
See pt's response via mychart regarding her sxs and wanting a chest xray

## 2020-08-01 NOTE — Telephone Encounter (Signed)
I ordered a cxr for [facility]  Please call her to set that up

## 2020-08-18 ENCOUNTER — Ambulatory Visit
Admission: RE | Admit: 2020-08-18 | Discharge: 2020-08-18 | Disposition: A | Discharge status: Home / Self Care | Payer: Medicare PPO | Source: Ambulatory Visit | Attending: Family Medicine | Admitting: Family Medicine

## 2020-08-18 ENCOUNTER — Other Ambulatory Visit: Payer: Self-pay

## 2020-08-18 DIAGNOSIS — Z1231 Encounter for screening mammogram for malignant neoplasm of breast: Secondary | ICD-10-CM | POA: Diagnosis not present

## 2020-09-27 DIAGNOSIS — H18422 Band keratopathy, left eye: Secondary | ICD-10-CM | POA: Diagnosis not present

## 2020-11-01 ENCOUNTER — Ambulatory Visit: Payer: Medicare PPO | Admitting: Podiatry

## 2020-11-01 ENCOUNTER — Other Ambulatory Visit: Payer: Self-pay

## 2020-11-01 ENCOUNTER — Ambulatory Visit: Payer: Medicare PPO

## 2020-11-01 ENCOUNTER — Encounter: Payer: Self-pay | Admitting: Podiatry

## 2020-11-01 DIAGNOSIS — M778 Other enthesopathies, not elsewhere classified: Secondary | ICD-10-CM | POA: Diagnosis not present

## 2020-11-01 MED ORDER — TRIAMCINOLONE ACETONIDE 40 MG/ML IJ SUSP
20.0000 mg | Freq: Once | INTRAMUSCULAR | Status: AC
  Administered 2020-11-01: 20 mg

## 2020-11-01 NOTE — Progress Notes (Signed)
She presents today for follow-up of capsulitis sesamoiditis of the left foot.  States that it feels like is the same thing and never quit hurting I just never came back because that shot hurts I tried many of the orthotics but never really helped.  Objective: Vital signs are stable alert oriented x3.  Pulses are palpable.  She no longer has pain on palpation of the sesamoids.  Does retain pain on palpation of the second metatarsophalangeal joint.  Radiographs do not demonstrate any type of osseous abnormalities previously.  Assessment: Capsulitis of the second metatarsal phalangeal joint.  Plan: At this point I injected 10 mg of Kenalog to the second interdigital space.  Tolerated procedure well.  Discussed appropriate shoe gear stretching exercises ice therapy and shoe gear modifications.  I will follow-up with her in a few weeks if not improved, due to its chronicity, may need to consider MRI directly.

## 2020-11-30 ENCOUNTER — Ambulatory Visit: Payer: Medicare PPO | Admitting: Dermatology

## 2020-11-30 ENCOUNTER — Other Ambulatory Visit: Payer: Self-pay

## 2020-11-30 DIAGNOSIS — L814 Other melanin hyperpigmentation: Secondary | ICD-10-CM

## 2020-11-30 DIAGNOSIS — D229 Melanocytic nevi, unspecified: Secondary | ICD-10-CM

## 2020-11-30 DIAGNOSIS — I781 Nevus, non-neoplastic: Secondary | ICD-10-CM | POA: Diagnosis not present

## 2020-11-30 DIAGNOSIS — Z1283 Encounter for screening for malignant neoplasm of skin: Secondary | ICD-10-CM

## 2020-11-30 DIAGNOSIS — D1800 Hemangioma unspecified site: Secondary | ICD-10-CM | POA: Diagnosis not present

## 2020-11-30 DIAGNOSIS — L578 Other skin changes due to chronic exposure to nonionizing radiation: Secondary | ICD-10-CM

## 2020-11-30 DIAGNOSIS — Z86018 Personal history of other benign neoplasm: Secondary | ICD-10-CM | POA: Diagnosis not present

## 2020-11-30 DIAGNOSIS — L821 Other seborrheic keratosis: Secondary | ICD-10-CM

## 2020-11-30 NOTE — Progress Notes (Signed)
   Follow-Up Visit   Subjective  Megan Harrison is a 65 y.o. female who presents for the following: Total body skin exam (Hx of dysplastic nevus L mid back 6.0cm lat to spine). The patient presents for Total-Body Skin Exam (TBSE) for skin cancer screening and mole check.  The following portions of the chart were reviewed this encounter and updated as appropriate:   Tobacco  Allergies  Meds  Problems  Med Hx  Surg Hx  Fam Hx     Review of Systems:  No other skin or systemic complaints except as noted in HPI or Assessment and Plan.  Objective  Well appearing patient in no apparent distress; mood and affect are within normal limits.  A full examination was performed including scalp, head, eyes, ears, nose, lips, neck, chest, axillae, abdomen, back, buttocks, bilateral upper extremities, bilateral lower extremities, hands, feet, fingers, toes, fingernails, and toenails. All findings within normal limits unless otherwise noted below.  L mid back 6.0cm lat to spine Scar with no evidence of recurrence.   bil legs Telangiectasias bil legs   Assessment & Plan   Lentigines - Scattered tan macules - Due to sun exposure - Benign-appering, observe - Recommend daily broad spectrum sunscreen SPF 30+ to sun-exposed areas, reapply every 2 hours as needed. - Call for any changes  Seborrheic Keratoses - Stuck-on, waxy, tan-brown papules and/or plaques  - Benign-appearing - Discussed benign etiology and prognosis. - Observe - Call for any changes  Melanocytic Nevi - Tan-brown and/or pink-flesh-colored symmetric macules and papules - Benign appearing on exam today - Observation - Call clinic for new or changing moles - Recommend daily use of broad spectrum spf 30+ sunscreen to sun-exposed areas.   Hemangiomas - Red papules - Discussed benign nature - Observe - Call for any changes  Actinic Damage - Chronic condition, secondary to cumulative UV/sun exposure - diffuse scaly  erythematous macules with underlying dyspigmentation - Recommend daily broad spectrum sunscreen SPF 30+ to sun-exposed areas, reapply every 2 hours as needed.  - Staying in the shade or wearing long sleeves, sun glasses (UVA+UVB protection) and wide brim hats (4-inch brim around the entire circumference of the hat) are also recommended for sun protection.  - Call for new or changing lesions.  Skin cancer screening performed today.  History of dysplastic nevus L mid back 6.0cm lat to spine  Clear. Observe for recurrence. Call clinic for new or changing lesions.  Recommend regular skin exams, daily broad-spectrum spf 30+ sunscreen use, and photoprotection.    Spider veins bil legs  Benign, discussed Sclerotherapy, $350 per treatment, several treatments for best results.  Return in about 2 years (around 12/01/2022) for TBSE, Hx of Dysplastic nevi.  I, Othelia Pulling, RMA, am acting as scribe for Sarina Ser, MD . Documentation: I have reviewed the above documentation for accuracy and completeness, and I agree with the above.  Sarina Ser, MD

## 2020-11-30 NOTE — Patient Instructions (Signed)

## 2020-12-04 ENCOUNTER — Ambulatory Visit: Payer: Medicare Other | Admitting: Podiatry

## 2020-12-05 ENCOUNTER — Encounter: Payer: Self-pay | Admitting: Dermatology

## 2020-12-13 ENCOUNTER — Ambulatory Visit: Payer: Medicare PPO | Admitting: Podiatry

## 2020-12-13 ENCOUNTER — Encounter: Payer: Self-pay | Admitting: Podiatry

## 2020-12-13 ENCOUNTER — Other Ambulatory Visit: Payer: Self-pay

## 2020-12-13 DIAGNOSIS — M778 Other enthesopathies, not elsewhere classified: Secondary | ICD-10-CM

## 2020-12-13 DIAGNOSIS — S99922A Unspecified injury of left foot, initial encounter: Secondary | ICD-10-CM

## 2020-12-13 DIAGNOSIS — S92812A Other fracture of left foot, initial encounter for closed fracture: Secondary | ICD-10-CM

## 2020-12-13 NOTE — Progress Notes (Signed)
She presents today for follow-up of her capsulitis first and second metatarsal phalangeal joints of her left foot.  States that it really has not gotten any better at all.  Objective: Vital signs are stable alert and oriented x3.  Pulses are palpable.  She still has pain on palpation of the sesamoids first metatarsophalangeal joint left with pain on end range of motion with dorsiflexion and plantarflexion particularly of the second metatarsal phalangeal joint.  There is warm to the touch but demonstrates no erythema.  Assessment: Capsulitis chronic in nature first and second metatarsals of the left foot at the metatarsophalangeal joint.  Plan: At this point I will request an MRI for differential diagnosis and surgical options.

## 2020-12-22 ENCOUNTER — Ambulatory Visit
Admission: RE | Admit: 2020-12-22 | Discharge: 2020-12-22 | Disposition: A | Discharge status: Home / Self Care | Payer: Medicare PPO | Source: Ambulatory Visit | Attending: Podiatry | Admitting: Podiatry

## 2020-12-22 ENCOUNTER — Other Ambulatory Visit: Payer: Self-pay

## 2020-12-22 DIAGNOSIS — S99922A Unspecified injury of left foot, initial encounter: Secondary | ICD-10-CM

## 2020-12-22 DIAGNOSIS — M25475 Effusion, left foot: Secondary | ICD-10-CM | POA: Diagnosis not present

## 2020-12-22 DIAGNOSIS — S92812A Other fracture of left foot, initial encounter for closed fracture: Secondary | ICD-10-CM

## 2020-12-22 DIAGNOSIS — M19072 Primary osteoarthritis, left ankle and foot: Secondary | ICD-10-CM | POA: Diagnosis not present

## 2020-12-27 ENCOUNTER — Other Ambulatory Visit: Payer: Medicare PPO

## 2021-01-03 ENCOUNTER — Ambulatory Visit: Payer: Medicare PPO | Admitting: Podiatry

## 2021-01-03 ENCOUNTER — Encounter: Payer: Self-pay | Admitting: Podiatry

## 2021-01-03 ENCOUNTER — Other Ambulatory Visit: Payer: Self-pay

## 2021-01-03 DIAGNOSIS — G5782 Other specified mononeuropathies of left lower limb: Secondary | ICD-10-CM

## 2021-01-03 DIAGNOSIS — G5762 Lesion of plantar nerve, left lower limb: Secondary | ICD-10-CM | POA: Diagnosis not present

## 2021-01-03 MED ORDER — TRIAMCINOLONE ACETONIDE 40 MG/ML IJ SUSP
20.0000 mg | Freq: Once | INTRAMUSCULAR | Status: AC
  Administered 2021-01-03: 20 mg

## 2021-01-03 NOTE — Progress Notes (Signed)
She presents today for follow-up of her MRI results.  States that her foot is really no better.  States that it hurts more over and here as she refers to the second and third metatarsal phalangeal joint area.  Objective: Vital signs are stable alert oriented x3 examination of the foot does demonstrate a plantarflexed third metatarsal which was moderately tender on palpation no longer she tender around the first and second metatarsophalangeal joints.  She does not demonstrate moderate findings of a neuroma to the third interdigital space of the left foot.  There is a palpable Mulder's click that is present as well.  MRI discusses only findings consistent with osteoarthritic change and sesamoiditis of the first metatarsophalangeal joint.  Assessment plantarflexed third metatarsal with capsulitis with neuroma third interdigital space left foot.  Plan: This point we injected 10 mg Kenalog 5 mg Marcaine to the point of maximal tenderness I made an appointment for her to follow-up with me in 1 month so we could discuss surgical intervention if necessary.

## 2021-02-07 ENCOUNTER — Other Ambulatory Visit: Payer: Self-pay

## 2021-02-07 ENCOUNTER — Encounter: Payer: Self-pay | Admitting: Podiatry

## 2021-02-07 ENCOUNTER — Ambulatory Visit: Payer: Medicare PPO | Admitting: Podiatry

## 2021-02-07 DIAGNOSIS — G5782 Other specified mononeuropathies of left lower limb: Secondary | ICD-10-CM

## 2021-02-07 NOTE — Progress Notes (Signed)
She presents today for follow-up of her neuroma third interspace of the left foot.  States that is a little better not quite as sore.  Objective: Vital signs are stable she is alert and oriented x3 palpable Mulder's click third interdigital space of the left foot with reproducible pain.  Assessment: Neuroma third interspace left.  Plan: Offered her dehydrated alcohol injections or surgery.  She would like to try dehydrated alcohol injections and we proceeded with the first 1 today.  I will follow-up with her in 1 month for her second dose.

## 2021-02-09 ENCOUNTER — Ambulatory Visit: Payer: Medicare PPO

## 2021-02-11 ENCOUNTER — Telehealth: Payer: Self-pay | Admitting: Family Medicine

## 2021-02-11 DIAGNOSIS — E7800 Pure hypercholesterolemia, unspecified: Secondary | ICD-10-CM

## 2021-02-11 DIAGNOSIS — Z0000 Encounter for general adult medical examination without abnormal findings: Secondary | ICD-10-CM

## 2021-02-11 NOTE — Telephone Encounter (Signed)
-----   Message from Ellamae Sia sent at 01/25/2021  8:06 AM EDT ----- Regarding: Lab orders for Monday, 10.10.22 Patient is scheduled for CPX labs, please order future labs, Thanks , Karna Christmas

## 2021-02-12 ENCOUNTER — Other Ambulatory Visit: Payer: Medicare PPO

## 2021-02-15 ENCOUNTER — Ambulatory Visit (INDEPENDENT_AMBULATORY_CARE_PROVIDER_SITE_OTHER): Payer: Medicare PPO | Admitting: Family Medicine

## 2021-02-15 ENCOUNTER — Other Ambulatory Visit: Payer: Self-pay

## 2021-02-15 ENCOUNTER — Encounter: Payer: Self-pay | Admitting: Family Medicine

## 2021-02-15 VITALS — BP 138/80 | HR 61 | Temp 97.6°F | Ht 63.25 in | Wt 155.0 lb

## 2021-02-15 DIAGNOSIS — E2839 Other primary ovarian failure: Secondary | ICD-10-CM

## 2021-02-15 DIAGNOSIS — Z800 Family history of malignant neoplasm of digestive organs: Secondary | ICD-10-CM

## 2021-02-15 DIAGNOSIS — M85859 Other specified disorders of bone density and structure, unspecified thigh: Secondary | ICD-10-CM

## 2021-02-15 DIAGNOSIS — E7800 Pure hypercholesterolemia, unspecified: Secondary | ICD-10-CM

## 2021-02-15 DIAGNOSIS — H6983 Other specified disorders of Eustachian tube, bilateral: Secondary | ICD-10-CM

## 2021-02-15 DIAGNOSIS — Z0000 Encounter for general adult medical examination without abnormal findings: Secondary | ICD-10-CM | POA: Insufficient documentation

## 2021-02-15 LAB — CBC WITH DIFFERENTIAL/PLATELET
Basophils Absolute: 0 10*3/uL (ref 0.0–0.1)
Basophils Relative: 0.4 % (ref 0.0–3.0)
Eosinophils Absolute: 0.2 10*3/uL (ref 0.0–0.7)
Eosinophils Relative: 2.6 % (ref 0.0–5.0)
HCT: 36.8 % (ref 36.0–46.0)
Hemoglobin: 12 g/dL (ref 12.0–15.0)
Lymphocytes Relative: 23.9 % (ref 12.0–46.0)
Lymphs Abs: 1.9 10*3/uL (ref 0.7–4.0)
MCHC: 32.6 g/dL (ref 30.0–36.0)
MCV: 93.8 fl (ref 78.0–100.0)
Monocytes Absolute: 0.7 10*3/uL (ref 0.1–1.0)
Monocytes Relative: 9.2 % (ref 3.0–12.0)
Neutro Abs: 5.1 10*3/uL (ref 1.4–7.7)
Neutrophils Relative %: 63.9 % (ref 43.0–77.0)
Platelets: 389 10*3/uL (ref 150.0–400.0)
RBC: 3.92 Mil/uL (ref 3.87–5.11)
RDW: 14 % (ref 11.5–15.5)
WBC: 8 10*3/uL (ref 4.0–10.5)

## 2021-02-15 LAB — LIPID PANEL
Cholesterol: 176 mg/dL (ref 0–200)
HDL: 75.3 mg/dL (ref >39.00)
LDL Cholesterol: 87 mg/dL (ref 0–99)
NonHDL: 101.07
Total CHOL/HDL Ratio: 2
Triglycerides: 69 mg/dL (ref 0.0–149.0)
VLDL: 13.8 mg/dL (ref 0.0–40.0)

## 2021-02-15 LAB — COMPREHENSIVE METABOLIC PANEL
ALT: 17 U/L (ref 0–35)
AST: 20 U/L (ref 0–37)
Albumin: 4.3 g/dL (ref 3.5–5.2)
Alkaline Phosphatase: 88 U/L (ref 39–117)
BUN: 17 mg/dL (ref 6–23)
CO2: 30 mEq/L (ref 19–32)
Calcium: 9.6 mg/dL (ref 8.4–10.5)
Chloride: 104 mEq/L (ref 96–112)
Creatinine, Ser: 0.58 mg/dL (ref 0.40–1.20)
GFR: 93.7 mL/min (ref >60.00)
Glucose, Bld: 92 mg/dL (ref 70–99)
Potassium: 4.6 mEq/L (ref 3.5–5.1)
Sodium: 140 mEq/L (ref 135–145)
Total Bilirubin: 0.5 mg/dL (ref 0.2–1.2)
Total Protein: 7 g/dL (ref 6.0–8.3)

## 2021-02-15 LAB — TSH: TSH: 1.68 u[IU]/mL (ref 0.35–5.50)

## 2021-02-15 MED ORDER — FLUTICASONE PROPIONATE 50 MCG/ACT NA SUSP: NASAL | 3 refills | Status: DC

## 2021-02-15 MED ORDER — MELOXICAM 15 MG PO TABS: 15.0000 mg | ORAL_TABLET | Freq: Every day | ORAL | 3 refills | Status: DC | PRN

## 2021-02-15 MED ORDER — ATORVASTATIN CALCIUM 10 MG PO TABS: 10.0000 mg | ORAL_TABLET | Freq: Every day | ORAL | 3 refills | Status: DC

## 2021-02-15 NOTE — Progress Notes (Signed)
Subjective:    Patient ID: Megan Harrison, female    DOB: February 17, 1956, 65 y.o.   MRN: [id number]  This visit occurred during the SARS-CoV-2 public health emergency.  Safety protocols were in place, including screening questions prior to the visit, additional usage of staff PPE, and extensive cleaning of exam room while observing appropriate contact time as indicated for disinfecting solutions.   HPI Pt presents for amw and health mt visit  I have personally reviewed the Medicare Annual Wellness questionnaire and have noted 1. The patient's medical and social history 2. Their use of alcohol, tobacco or illicit drugs 3. Their current medications and supplements 4. The patient's functional ability including ADL's, fall risks, home safety risks and hearing or visual             impairment. 5. Diet and physical activities 6. Evidence for depression or mood disorders  The patients weight, height, BMI have been recorded in the chart and visual acuity is per eye clinic.  I have made referrals, counseling and provided education to the patient based review of the above and I have provided the pt with a written personalized care plan for preventive services. Reviewed and updated provider list, see scanned forms.  See scanned forms.  Routine anticipatory guidance given to patient.  See health maintenance. Colon cancer screening  10/2018 with 5 y recall  brother had colon cancer in 109s Breast cancer screening  mammogram 4/22 Self breast exam-no lumps  Flu vaccine- had that last month  Tetanus vaccine  Tdap 2016 Pneumovax up to date Zoster vaccine shingrix one /still has one more to do (it made her feel sick)  Dexa 9/20 osteopenia of FN Falls- none  Fractures-none  Supplements-ca and D Exercise - walking /10,000 steps daily  Getting tx for neuroma in L foot  Injections, Dr Milinda Pointer    Advance directive-up to date  Cognitive function addressed- see scanned forms- and if abnormal then  additional documentation follows.   No problems at all  Handles own affairs   PMH and SH reviewed  Meds, vitals, and allergies reviewed.   ROS: See HPI.  Otherwise negative.    Weight : Wt Readings from Last 3 Encounters:  02/15/21 155 lb (70.3 kg)  02/08/20 167 lb (75.8 kg)  05/27/19 171 lb 8 oz (77.8 kg)   27.24 kg/m Has worked hard on weight loss  Proud of that   Hearing/vision: Hearing Screening   500Hz  1000Hz  2000Hz  4000Hz   Right ear 40 40 25 40  Left ear 40 0 25 25   Right ear feels full after a cold Other ear-fine   Eye exam, is up to date and sees Dr Lonzo Cloud frequently (after zoster in eye)  Keeps up with that, glasses  Distance R eye 20/20 and left eye not as good   PHQ: Depression screen [facility] 2/9 02/15/2021 02/01/2020 01/18/2019 01/12/2018 12/10/2016  Decreased Interest 0 0 0 0 0  Down, Depressed, Hopeless 0 0 0 0 0  PHQ - 2 Score 0 0 0 0 0  Altered sleeping - 0 - - 0  Tired, decreased energy - 0 - - 0  Change in appetite - 0 - - 0  Feeling bad or failure about yourself  - 0 - - 0  Trouble concentrating - 0 - - 0  Moving slowly or fidgety/restless - 0 - - 0  Suicidal thoughts - 0 - - 0  PHQ-9 Score - 0 - - 0  Difficult doing work/chores -  Not difficult at all - - Not difficult at all   Mood is good   ADLs : no problems at all   Functionality: very good   Care team : Gertrude Tarbet-pcp [facility]-podiat[facility] Kowalski-derm  Doing well overall  Has been traveling out of the country  Some snorkeling  Has some fluid in her R ear and a head cold and diarrhea (got better)   BP Readings from Last 3 Encounters:  02/15/21 138/80  02/08/20 130/72  05/27/19 132/80   Pulse Readings from Last 3 Encounters:  02/15/21 61  02/08/20 69  05/27/19 78    Hyperlipidemia Lab Results  Component Value Date   CHOL 186 02/01/2020   HDL 73.30 02/01/2020   LDLCALC 93 02/01/2020   LDLDIRECT 102.9 06/10/2011   TRIG 98.0 02/01/2020   CHOLHDL 3 02/01/2020   Due for  labs Eating bad on vacation so unsure if it will look as good  Atorvastatin 10 mg daily   Patient Active Problem List   Diagnosis Date Noted   Medicare annual wellness visit, subsequent 02/15/2021   Osteopenia 01/31/2019   Estrogen deficiency 01/18/2019   Welcome to Medicare preventive visit 01/11/2019   Family history of colon cancer - brother in early 60's 10/21/2018   Encounter for screening mammogram for breast cancer 05/19/2017   Hyperlipidemia 12/10/2016   ETD (eustachian tube dysfunction) 11/27/2015   Encounter for routine gynecological examination 11/04/2014   Hemorrhoids, internal 04/02/2013   Routine general medical examination at a health care facility 06/09/2011   ALLERGIC RHINITIS 03/10/2009   History of shingles 08/28/2006   Past Medical History:  Diagnosis Date   Anemia    Cutaneous sarcoidosis    Degenerative disc disease    Dysplastic nevus 11/19/2017   L mid back 6.0 cm lat to spine   Foot pain    Hyperlipidemia    Migraine    Zoster    Opthy (regular checks)   Past Surgical History:  Procedure Laterality Date   2D Echo  1999   Negative   ABDOMINAL HYSTERECTOMY  11/99   APPENDECTOMY     Arm lesion, cutaneous sarcoid  12/2005   with normal CXR and ANA   CESAREAN SECTION     two times   COLONOSCOPY  11/0/2009,08/18/13   diverticulosis and hemorrhoids   DIAGNOSTIC LAPAROSCOPY     EYE SURGERY  10/13/2018   had shingles in left eye, had to have surgery, has contact lens in now.   LAPAROSCOPIC APPENDECTOMY N/A 02/18/2016   Procedure: APPENDECTOMY LAPAROSCOPIC;  Surgeon: Ralene Ok, MD;  Location: [place];  Service: General;  Laterality: N/A;   LUMBAR LAMINECTOMY  10/2017   OOPHORECTOMY     ROTATOR CUFF REPAIR     TONSILLECTOMY     UPPER GASTROINTESTINAL ENDOSCOPY  11/15/2010   non-erosive gastritis, H. pylori negative   Social History   Tobacco Use   Smoking status: Never   Smokeless tobacco: Never  Vaping Use   Vaping Use: Never used   Substance Use Topics   Alcohol use: No    Alcohol/week: 0.0 standard drinks   Drug use: No   Family History  Problem Relation Age of Onset   Colon cancer Brother        early 24's   Stomach cancer Brother    Heart disease Mother        CAD   Alcohol abuse Mother    Lung cancer Father    Heart disease Brother  CAD   Throat cancer Brother    Heart disease Brother        CAD, pre diabetic, obese   Colon polyps Sister    Breast cancer Neg Hx    Rectal cancer Neg Hx    Allergies  Allergen Reactions   Codeine     REACTION: syncope   Sumatriptan     REACTION: choking sensation   Current Outpatient Medications on File Prior to Visit  Medication Sig Dispense Refill   aspirin 81 MG tablet Take 81 mg by mouth daily.      Cholecalciferol (VITAMIN D3) 50 MCG (2000 UT) TABS Take 1 tablet by mouth daily.     ferrous sulfate 325 (65 FE) MG tablet Take 325 mg by mouth daily.     Multiple Vitamins-Minerals (ONE-A-DAY WOMENS 50 PLUS PO) Take 1 tablet by mouth daily.     prednisoLONE acetate (PRED FORTE) 1 % ophthalmic suspension Place 1 drop into the left eye every other day.     Timolol Maleate 0.5 % (DAILY) SOLN      No current facility-administered medications on file prior to visit.    Review of Systems  Constitutional:  Negative for activity change, appetite change, fatigue, fever and unexpected weight change.  HENT:  Negative for congestion, ear pain, rhinorrhea, sinus pressure and sore throat.        Right ear pressure  Eyes:  Negative for pain, redness and visual disturbance.  Respiratory:  Negative for cough, shortness of breath and wheezing.   Cardiovascular:  Negative for chest pain and palpitations.  Gastrointestinal:  Negative for abdominal pain, blood in stool, constipation and diarrhea.  Endocrine: Negative for polydipsia and polyuria.  Genitourinary:  Negative for dysuria, frequency and urgency.  Musculoskeletal:  Negative for arthralgias, back pain and  myalgias.  Skin:  Negative for pallor and rash.  Allergic/Immunologic: Negative for environmental allergies.  Neurological:  Negative for dizziness, syncope and headaches.  Hematological:  Negative for adenopathy. Does not bruise/bleed easily.  Psychiatric/Behavioral:  Negative for decreased concentration and dysphoric mood. The patient is not nervous/anxious.       Objective:   Physical Exam Constitutional:      General: She is not in acute distress.    Appearance: Normal appearance. She is well-developed and normal weight. She is not ill-appearing or diaphoretic.  HENT:     Head: Normocephalic and atraumatic.     Right Ear: Tympanic membrane, ear canal and external ear normal.     Left Ear: Tympanic membrane, ear canal and external ear normal.     Nose: Nose normal. No congestion.     Mouth/Throat:     Mouth: Mucous membranes are moist.     Pharynx: Oropharynx is clear. No posterior oropharyngeal erythema.  Eyes:     General: No scleral icterus.    Extraocular Movements: Extraocular movements intact.     Conjunctiva/sclera: Conjunctivae normal.     Pupils: Pupils are equal, round, and reactive to light.  Neck:     Thyroid: No thyromegaly.     Vascular: No carotid bruit or JVD.  Cardiovascular:     Rate and Rhythm: Normal rate and regular rhythm.     Pulses: Normal pulses.     Heart sounds: Normal heart sounds.    No gallop.  Pulmonary:     Effort: Pulmonary effort is normal. No respiratory distress.     Breath sounds: Normal breath sounds. No wheezing.     Comments: Good air exch Chest:  Chest wall: No tenderness.  Abdominal:     General: Bowel sounds are normal. There is no distension or abdominal bruit.     Palpations: Abdomen is soft. There is no mass.     Tenderness: There is no abdominal tenderness.     Hernia: No hernia is present.  Genitourinary:    Comments: Breast exam: No mass, nodules, thickening, tenderness, bulging, retraction, inflamation, nipple  discharge or skin changes noted.  No axillary or clavicular LA.     Musculoskeletal:        General: No tenderness. Normal range of motion.     Cervical back: Normal range of motion and neck supple. No rigidity. No muscular tenderness.     Right lower leg: No edema.     Left lower leg: No edema.     Comments: No kyphosis   Lymphadenopathy:     Cervical: No cervical adenopathy.  Skin:    General: Skin is warm and dry.     Coloration: Skin is not pale.     Findings: No erythema or rash.     Comments: Mildly tanned Solar lentigines diffusely   Neurological:     Mental Status: She is alert. Mental status is at baseline.     Cranial Nerves: No cranial nerve deficit.     Motor: No abnormal muscle tone.     Coordination: Coordination normal.     Gait: Gait normal.     Deep Tendon Reflexes: Reflexes are normal and symmetric. Reflexes normal.  Psychiatric:        Mood and Affect: Mood normal.        Cognition and Memory: Cognition and memory normal.          Assessment & Plan:   Problem List Items Addressed This Visit       Nervous and Auditory   ETD (eustachian tube dysfunction)    Right side after traveling  Adv inc flonase to 2 sp per nostril for 2 wk and update if not improved Watch for pain or signs of infection         Musculoskeletal and Integument   Osteopenia    Due for 2 y dexa Order done and pt will schedule  No falls or fractures Plan to continue ca, D and exercise        Other   Routine general medical examination at a health care facility    Reviewed health habits including diet and exercise and skin cancer prevention Reviewed appropriate screening tests for age  Also reviewed health mt list, fam hx and immunization status , as well as social and family history   See HPI Labs reviewed  Colonoscopy utd Mammogram utd dexa ordered, no falls or fractures  Pending 2nd shingrix  covid vaccinated  Good exercise  Advance directive utd  No cognitive  concerns Hearing screen reviewed  utd eye /vision care (h/o post herp eye _ phq of 0 No problems with ADLs      Hyperlipidemia    Labs ordered  Disc goals for lipids and reasons to control them Rev last labs with pt Rev low sat fat diet in detail Diet was not as good on vacation  Plan to continue atorvastatin 10 mg daily      Relevant Medications   atorvastatin (LIPITOR) 10 MG tablet   Family history of colon cancer - brother in early 20's   Estrogen deficiency   Relevant Orders   DG Bone Density   Medicare annual wellness visit, subsequent -  Primary    Reviewed health habits including diet and exercise and skin cancer prevention Reviewed appropriate screening tests for age  Also reviewed health mt list, fam hx and immunization status , as well as social and family history   See HPI Labs reviewed  Colonoscopy utd Mammogram utd dexa ordered, no falls or fractures  Pending 2nd shingrix  covid vaccinated  Good exercise  Advance directive utd  No cognitive concerns Hearing screen reviewed  utd eye /vision care (h/o post herp eye _ phq of 0 No problems with ADLs

## 2021-02-15 NOTE — Assessment & Plan Note (Signed)
Reviewed health habits including diet and exercise and skin cancer prevention Reviewed appropriate screening tests for age  Also reviewed health mt list, fam hx and immunization status , as well as social and family history   See HPI Labs reviewed  Colonoscopy utd Mammogram utd dexa ordered, no falls or fractures  Pending 2nd shingrix  covid vaccinated  Good exercise  Advance directive utd  No cognitive concerns Hearing screen reviewed  utd eye /vision care (h/o post herp eye _ phq of 0 No problems with ADLs

## 2021-02-15 NOTE — Patient Instructions (Addendum)
To help ear, use 2 sprays in each nostril instead of 2 for about 2 weeks  If not improved let me know  Call and schedule your bone density test Keep walking   Labs today

## 2021-02-15 NOTE — Assessment & Plan Note (Signed)
Labs ordered  Disc goals for lipids and reasons to control them Rev last labs with pt Rev low sat fat diet in detail Diet was not as good on vacation  Plan to continue atorvastatin 10 mg daily

## 2021-02-15 NOTE — Assessment & Plan Note (Signed)
Due for 2 y dexa Order done and pt will schedule  No falls or fractures Plan to continue ca, D and exercise

## 2021-02-15 NOTE — Assessment & Plan Note (Signed)
Right side after traveling  Adv inc flonase to 2 sp per nostril for 2 wk and update if not improved Watch for pain or signs of infection

## 2021-03-12 ENCOUNTER — Other Ambulatory Visit: Payer: Self-pay

## 2021-03-12 ENCOUNTER — Ambulatory Visit: Payer: Medicare PPO | Admitting: Podiatry

## 2021-03-12 ENCOUNTER — Encounter: Payer: Self-pay | Admitting: Podiatry

## 2021-03-12 DIAGNOSIS — G5782 Other specified mononeuropathies of left lower limb: Secondary | ICD-10-CM | POA: Diagnosis not present

## 2021-03-12 NOTE — Progress Notes (Signed)
She presents today for follow-up of her neuroma after her first dose of dehydrated alcohol was injected in the third interdigital space of the left foot.  She states that it really feels about the same has not improved or worsened.  Objective: Vital signs are stable alert oriented x3 palpable Mulder's click third interdigital space of the left foot.  Assessment: Neuroma third interspace left foot.  Plan: Injection of dehydrated alcohol second dose left foot third interdigital space.  Follow-up with her in 3 to 4 weeks

## 2021-04-11 ENCOUNTER — Ambulatory Visit: Payer: Medicare PPO | Admitting: Podiatry

## 2021-04-18 ENCOUNTER — Ambulatory Visit: Payer: Medicare PPO | Admitting: Podiatry

## 2021-04-18 ENCOUNTER — Encounter: Payer: Self-pay | Admitting: Podiatry

## 2021-04-18 ENCOUNTER — Other Ambulatory Visit: Payer: Self-pay

## 2021-04-18 DIAGNOSIS — G5782 Other specified mononeuropathies of left lower limb: Secondary | ICD-10-CM

## 2021-04-18 NOTE — Progress Notes (Signed)
She presents today for follow-up of her neuroma third interspace left foot.  States that there has been absolutely no improvement with the injection of the third interdigital space with the  2 doses of dehydrated alcohol.  Objective: Palpable Mulder's click third interdigital space of the left foot.  Pain on palpation of this area as well.  Assessment: Neuroma third interspace.  Plan: Injected the third dose of dehydrated alcohol to the left foot today hopefully this will start to alleviate her symptoms.  If not we will consider surgical intervention regarding this.

## 2021-05-06 HISTORY — PX: BREAST LUMPECTOMY: SHX2

## 2021-05-16 ENCOUNTER — Encounter: Payer: Self-pay | Admitting: Podiatry

## 2021-05-16 ENCOUNTER — Ambulatory Visit: Payer: Medicare PPO | Admitting: Podiatry

## 2021-05-16 ENCOUNTER — Other Ambulatory Visit: Payer: Self-pay

## 2021-05-16 DIAGNOSIS — G5782 Other specified mononeuropathies of left lower limb: Secondary | ICD-10-CM

## 2021-05-16 NOTE — Progress Notes (Signed)
She presents today for follow-up of her third interdigital space neuroma left foot.  States that the injections have not helped she would prefer surgery at this point.  Objective: Vital signs are stable she is alert and oriented x3.  Pulses are palpable.  She has pain on palpation to the third interdigital space of the left foot.  Assessment: Neuroma third interspace left foot.  Plan: Discussed in great detail today once consented her for a neurectomy third interspace of the left foot she understands this is amenable to it discussed possible postop complications which may include but not limited to postop pain bleeding swell infection recurrence need for further surgery overcorrection under correction loss of digit loss of limb loss of life.

## 2021-05-29 ENCOUNTER — Telehealth: Payer: Self-pay | Admitting: Urology

## 2021-05-29 NOTE — Telephone Encounter (Signed)
DOS - 06/29/21  NEURECTOMY 3 LEFT --- 67011  [employer] EFFECTIVE DATE - 05/07/19  PER [facility] [employer] The following codes do not require a pre-authorization Created on 05/29/2021 Service info 28080 Excision, interdigital Eden Lathe) neuroma, single, each

## 2021-05-30 ENCOUNTER — Ambulatory Visit
Admission: RE | Admit: 2021-05-30 | Discharge: 2021-05-30 | Disposition: A | Discharge status: Home / Self Care | Payer: Medicare PPO | Source: Ambulatory Visit | Attending: Family Medicine | Admitting: Family Medicine

## 2021-05-30 ENCOUNTER — Other Ambulatory Visit: Payer: Self-pay

## 2021-05-30 DIAGNOSIS — M8589 Other specified disorders of bone density and structure, multiple sites: Secondary | ICD-10-CM | POA: Diagnosis not present

## 2021-05-30 DIAGNOSIS — E2839 Other primary ovarian failure: Secondary | ICD-10-CM | POA: Diagnosis not present

## 2021-05-30 DIAGNOSIS — Z780 Asymptomatic menopausal state: Secondary | ICD-10-CM | POA: Diagnosis not present

## 2021-06-27 ENCOUNTER — Encounter: Payer: Self-pay | Admitting: Podiatry

## 2021-06-27 ENCOUNTER — Other Ambulatory Visit: Payer: Self-pay | Admitting: Podiatry

## 2021-06-27 MED ORDER — ONDANSETRON HCL 4 MG PO TABS: 4.0000 mg | ORAL_TABLET | Freq: Three times a day (TID) | ORAL | 0 refills | Status: DC | PRN

## 2021-06-27 MED ORDER — GABAPENTIN 300 MG PO CAPS: 300.0000 mg | ORAL_CAPSULE | Freq: Three times a day (TID) | ORAL | 3 refills | Status: DC

## 2021-06-27 MED ORDER — CEPHALEXIN 500 MG PO CAPS: 500.0000 mg | ORAL_CAPSULE | Freq: Three times a day (TID) | ORAL | 0 refills | Status: DC

## 2021-06-27 NOTE — Telephone Encounter (Signed)
Please advise 

## 2021-06-29 DIAGNOSIS — G5762 Lesion of plantar nerve, left lower limb: Secondary | ICD-10-CM | POA: Diagnosis not present

## 2021-06-29 DIAGNOSIS — M25572 Pain in left ankle and joints of left foot: Secondary | ICD-10-CM | POA: Diagnosis not present

## 2021-07-04 ENCOUNTER — Encounter: Payer: Self-pay | Admitting: Podiatry

## 2021-07-04 ENCOUNTER — Ambulatory Visit (INDEPENDENT_AMBULATORY_CARE_PROVIDER_SITE_OTHER): Payer: Medicare PPO | Admitting: Podiatry

## 2021-07-04 ENCOUNTER — Other Ambulatory Visit: Payer: Self-pay

## 2021-07-04 DIAGNOSIS — Z9889 Other specified postprocedural states: Secondary | ICD-10-CM

## 2021-07-04 DIAGNOSIS — G5782 Other specified mononeuropathies of left lower limb: Secondary | ICD-10-CM

## 2021-07-04 NOTE — Progress Notes (Signed)
She presents today for her first postop visit date of surgery 06/29/2021 excision neuroma third interdigital space of the left foot she states that she really has no pain with the surgery at all still has some tenderness underneath the foot around the third metatarsal head she states that unready get out of this boot and tired sleeping in it.  Denies fever chills nausea vomit muscle aches pains calf pain back pain chest pain shortness of breath. ? ?Objective: Dry sterile dressing intact ambulating with full weight in the cam walker.  Once removed demonstrates no erythema edema cellulitis drainage or odor no bleeding on the bandage.  Incision is intact margins are well coapting. ? ?Assessment: Well-healing surgical foot. ? ?Plan: Redressed today dressed a compressive dressing to back in her cam walker follow-up with me in 1 week ?

## 2021-07-11 ENCOUNTER — Other Ambulatory Visit: Payer: Self-pay

## 2021-07-11 ENCOUNTER — Encounter: Payer: Self-pay | Admitting: Podiatry

## 2021-07-11 ENCOUNTER — Ambulatory Visit: Payer: Medicare PPO | Admitting: Podiatry

## 2021-07-11 DIAGNOSIS — Z9889 Other specified postprocedural states: Secondary | ICD-10-CM

## 2021-07-11 DIAGNOSIS — G5782 Other specified mononeuropathies of left lower limb: Secondary | ICD-10-CM

## 2021-07-11 NOTE — Progress Notes (Signed)
She presents today for her postop visit date of surgery June 29, 2021.  She denies fever chills nausea vomiting muscle aches pains calf pain back pain chest pain shortness of breath.  States that she was taking a shower yesterday and her foot got wet and she had to redress it. ? ?Objective: Vital signs are stable she is alert and oriented x3 she presents in her cam walker today once removed demonstrates dressing intact that she put on.  It looks good.  No erythema edema cellulitis drainage or odor no signs of infection.  Sutures are intact margins are questionable in a couple of small spots but I do think that for the most part it is healed deep. ? ?Assessment well-healing excision neuroma third interspace left. ? ?Plan: I am going to place a light dressing across the wound today with Silvadene cream I also placed her in a Darco shoe and I will follow-up with her in 2 weeks. ?

## 2021-07-25 ENCOUNTER — Encounter: Payer: Self-pay | Admitting: Podiatry

## 2021-07-25 ENCOUNTER — Ambulatory Visit (INDEPENDENT_AMBULATORY_CARE_PROVIDER_SITE_OTHER): Payer: Medicare PPO | Admitting: Podiatry

## 2021-07-25 ENCOUNTER — Other Ambulatory Visit: Payer: Self-pay

## 2021-07-25 DIAGNOSIS — Z9889 Other specified postprocedural states: Secondary | ICD-10-CM

## 2021-07-25 DIAGNOSIS — G5782 Other specified mononeuropathies of left lower limb: Secondary | ICD-10-CM

## 2021-07-25 NOTE — Progress Notes (Signed)
Megan Harrison presents today for follow-up of her neuroma third interdigital space left foot and its excision.  Megan Harrison states that it feels okay just Megan Harrison states that Megan Harrison still gets a quickie pain feeling very similar to the point Megan Harrison was feeling prior to the surgery. ? ?Objective: Vital signs are stable Megan Harrison is alert and oriented x3 incision site is gone on to heal uneventfully.  Sutures were removed today with no dehiscence of the wound.  Megan Harrison has some numbness and tingling and Megan Harrison has substantial scarring through the foot to the plantar aspect. ? ?Assessment: Well-healing surgical foot.  Megan Harrison still has some retention quick sharp pain to the plantar aspect of the foot. ? ?Plan: We discussed the possible need for orthotics to help offload the metatarsal heads so we probably will schedule her for that at next visit and then unguinal allow her to get back into her tennis shoes now. ?

## 2021-08-08 ENCOUNTER — Encounter: Payer: Medicare PPO | Admitting: Podiatry

## 2021-08-22 ENCOUNTER — Ambulatory Visit (INDEPENDENT_AMBULATORY_CARE_PROVIDER_SITE_OTHER): Payer: Medicare PPO | Admitting: Podiatry

## 2021-08-22 ENCOUNTER — Encounter: Payer: Self-pay | Admitting: Podiatry

## 2021-08-22 DIAGNOSIS — M778 Other enthesopathies, not elsewhere classified: Secondary | ICD-10-CM | POA: Diagnosis not present

## 2021-08-22 DIAGNOSIS — Z9889 Other specified postprocedural states: Secondary | ICD-10-CM | POA: Diagnosis not present

## 2021-08-22 DIAGNOSIS — G5782 Other specified mononeuropathies of left lower limb: Secondary | ICD-10-CM | POA: Diagnosis not present

## 2021-08-22 MED ORDER — TRIAMCINOLONE ACETONIDE 40 MG/ML IJ SUSP
20.0000 mg | Freq: Once | INTRAMUSCULAR | Status: AC
  Administered 2021-08-22: 20 mg

## 2021-08-22 NOTE — Progress Notes (Signed)
Megan Harrison presents today for her fourth postop visit excision neuroma third interspace of the left foot.  She states that it really feels like it has not made any difference she says it feels likely going backwards instead of getting better.  She states that the surgery really did not address what seems to be hurting. ? ?Objective: Vital signs are stable she is alert and oriented x3 years incision site for the excision of the neuroma third interspace of the left foot has gone very well and is healing nicely though he does have some deep scar tissue present.  The majority of her tenderness is still beneath the third metatarsal head of the left foot.  She really does not have a lot of pain on end range of motion she does not have a palpable bursa nor does she have inflammation beneath the third metatarsal head.  There is no callus development. ? ?Assessment: Well-healing surgical foot but still has pain beneath the third metatarsal head of the left foot. ? ?Plan: At this point we did an intra-articular injection of dexamethasone local anesthetic after alcohol skin prep.  She tolerated the procedure just fine I like to follow-up with her in about a month to see how she is doing with this.  We also discussed cutting a hole in a insole to help alleviate the pressure beneath the third metatarsal head.  She understands that we will try. ?

## 2021-09-21 ENCOUNTER — Other Ambulatory Visit: Payer: Self-pay | Admitting: Family Medicine

## 2021-09-21 DIAGNOSIS — Z1231 Encounter for screening mammogram for malignant neoplasm of breast: Secondary | ICD-10-CM

## 2021-09-24 ENCOUNTER — Encounter: Payer: Self-pay | Admitting: Podiatry

## 2021-09-24 ENCOUNTER — Ambulatory Visit (INDEPENDENT_AMBULATORY_CARE_PROVIDER_SITE_OTHER): Payer: Medicare PPO | Admitting: Podiatry

## 2021-09-24 DIAGNOSIS — Z9889 Other specified postprocedural states: Secondary | ICD-10-CM

## 2021-09-24 DIAGNOSIS — G5782 Other specified mononeuropathies of left lower limb: Secondary | ICD-10-CM

## 2021-09-24 NOTE — Progress Notes (Signed)
She presents today for follow-up of her neuroma excision she states that part is gone to get better but the metatarsal pain is doing just a little bit better than it was particularly with the cut out in the shoe.  She also goes on to say that the shot lasted only for about an hour or 2 while the local anesthetic was present and then it returned to the same pain.  Objective: Vital signs are stable alert oriented x3.  With no change in the metatarsal with the's steroid injection in most likely this is metatarsalgia or and edema in the metatarsal bone itself.  Assessment probable bony edema to the third metatarsal of the left foot.  Well-healing neurectomy.  Plan: Discussed etiology pathology conservative surgical therapies at this point time I recommend that she continue all current therapies and follow-up with me on an as-needed basis.

## 2021-09-25 ENCOUNTER — Ambulatory Visit
Admission: RE | Admit: 2021-09-25 | Discharge: 2021-09-25 | Disposition: A | Discharge status: Home / Self Care | Payer: Medicare PPO | Source: Ambulatory Visit | Attending: Family Medicine | Admitting: Family Medicine

## 2021-09-25 DIAGNOSIS — Z1231 Encounter for screening mammogram for malignant neoplasm of breast: Secondary | ICD-10-CM | POA: Diagnosis not present

## 2021-09-26 ENCOUNTER — Other Ambulatory Visit: Payer: Self-pay | Admitting: Family Medicine

## 2021-09-26 DIAGNOSIS — N6489 Other specified disorders of breast: Secondary | ICD-10-CM

## 2021-09-26 DIAGNOSIS — R928 Other abnormal and inconclusive findings on diagnostic imaging of breast: Secondary | ICD-10-CM

## 2021-10-10 ENCOUNTER — Ambulatory Visit
Admission: RE | Admit: 2021-10-10 | Discharge: 2021-10-10 | Disposition: A | Discharge status: Home / Self Care | Payer: Medicare PPO | Source: Ambulatory Visit | Attending: Family Medicine | Admitting: Family Medicine

## 2021-10-10 ENCOUNTER — Other Ambulatory Visit: Payer: Self-pay | Admitting: Family Medicine

## 2021-10-10 DIAGNOSIS — R928 Other abnormal and inconclusive findings on diagnostic imaging of breast: Secondary | ICD-10-CM

## 2021-10-10 DIAGNOSIS — N6489 Other specified disorders of breast: Secondary | ICD-10-CM | POA: Insufficient documentation

## 2021-10-10 DIAGNOSIS — N63 Unspecified lump in unspecified breast: Secondary | ICD-10-CM

## 2021-10-10 DIAGNOSIS — N6321 Unspecified lump in the left breast, upper outer quadrant: Secondary | ICD-10-CM | POA: Diagnosis not present

## 2021-10-23 ENCOUNTER — Ambulatory Visit
Admission: RE | Admit: 2021-10-23 | Discharge: 2021-10-23 | Disposition: A | Discharge status: Home / Self Care | Payer: Medicare PPO | Source: Ambulatory Visit | Attending: Family Medicine | Admitting: Family Medicine

## 2021-10-23 DIAGNOSIS — C50412 Malignant neoplasm of upper-outer quadrant of left female breast: Secondary | ICD-10-CM | POA: Diagnosis not present

## 2021-10-23 DIAGNOSIS — R928 Other abnormal and inconclusive findings on diagnostic imaging of breast: Secondary | ICD-10-CM | POA: Diagnosis not present

## 2021-10-23 DIAGNOSIS — N63 Unspecified lump in unspecified breast: Secondary | ICD-10-CM

## 2021-10-23 HISTORY — PX: BREAST BIOPSY: SHX20

## 2021-10-24 ENCOUNTER — Encounter: Payer: Self-pay | Admitting: Family Medicine

## 2021-10-24 ENCOUNTER — Encounter: Payer: Self-pay | Admitting: *Deleted

## 2021-10-24 DIAGNOSIS — C50919 Malignant neoplasm of unspecified site of unspecified female breast: Secondary | ICD-10-CM

## 2021-10-24 NOTE — Progress Notes (Signed)
Received referral for newly diagnosed breast cancer from [facility] Radiology.  Navigation initiated.  Med onc scheduled with Dr. Janese Banks for Monday 10/29/21.  Referral sent to [facility] [facility] per patient request, that office will call her with the appt.

## 2021-10-25 ENCOUNTER — Other Ambulatory Visit: Payer: Self-pay | Admitting: Anatomic Pathology & Clinical Pathology

## 2021-10-25 LAB — SURGICAL PATHOLOGY

## 2021-10-29 ENCOUNTER — Encounter: Payer: Self-pay | Admitting: *Deleted

## 2021-10-29 ENCOUNTER — Encounter: Payer: Self-pay | Admitting: Oncology

## 2021-10-29 ENCOUNTER — Inpatient Hospital Stay: Payer: Medicare PPO | Attending: Oncology | Admitting: Oncology

## 2021-10-29 ENCOUNTER — Inpatient Hospital Stay: Payer: Medicare PPO

## 2021-10-29 VITALS — BP 155/75 | HR 69 | Temp 98.3°F | Resp 18 | Ht 63.5 in | Wt 183.6 lb

## 2021-10-29 DIAGNOSIS — Z8371 Family history of colonic polyps: Secondary | ICD-10-CM | POA: Insufficient documentation

## 2021-10-29 DIAGNOSIS — Z800 Family history of malignant neoplasm of digestive organs: Secondary | ICD-10-CM | POA: Diagnosis not present

## 2021-10-29 DIAGNOSIS — Z801 Family history of malignant neoplasm of trachea, bronchus and lung: Secondary | ICD-10-CM | POA: Diagnosis not present

## 2021-10-29 DIAGNOSIS — C50412 Malignant neoplasm of upper-outer quadrant of left female breast: Secondary | ICD-10-CM | POA: Diagnosis not present

## 2021-10-29 DIAGNOSIS — Z79899 Other long term (current) drug therapy: Secondary | ICD-10-CM | POA: Diagnosis not present

## 2021-10-29 DIAGNOSIS — Z171 Estrogen receptor negative status [ER-]: Secondary | ICD-10-CM | POA: Diagnosis not present

## 2021-10-29 DIAGNOSIS — C50919 Malignant neoplasm of unspecified site of unspecified female breast: Secondary | ICD-10-CM

## 2021-10-29 DIAGNOSIS — Z7189 Other specified counseling: Secondary | ICD-10-CM | POA: Diagnosis not present

## 2021-10-30 DIAGNOSIS — H18422 Band keratopathy, left eye: Secondary | ICD-10-CM | POA: Diagnosis not present

## 2021-11-04 ENCOUNTER — Encounter: Payer: Self-pay | Admitting: Oncology

## 2021-11-04 DIAGNOSIS — C50412 Malignant neoplasm of upper-outer quadrant of left female breast: Secondary | ICD-10-CM

## 2021-11-04 DIAGNOSIS — Z171 Estrogen receptor negative status [ER-]: Secondary | ICD-10-CM

## 2021-11-04 HISTORY — DX: Estrogen receptor negative status (ER-): C50.412

## 2021-11-04 HISTORY — DX: Malignant neoplasm of upper-outer quadrant of left female breast: Z17.1

## 2021-11-04 NOTE — Progress Notes (Signed)
Hematology/Oncology Consult note [facility] Telephone:([phone] [phone]  Patient Care Team: Tower, Wynelle Fanny, MD as PCP - General Daiva Huge, RN as Oncology Nurse Navigator   Name of the patient: Megan Harrison  [id number]  [birth year]    Reason for referral- new diagnosis of breast cancer   Referring physician- Dr. Glori Bickers  Date of visit: 11/04/21   History of presenting illness- Patient is a 66 year old female who underwent a screening bilateral mammogram in May 2023 which showed a possible asymmetry in the left breast.  This was followed by diagnostic mammogram and ultrasound which showed a hypoechoic mass in the left breast 3 x 4 x 6 mm.  No abnormality noted in the left axillary lymph nodes.  This was biopsied and was consistent with invasive mammary carcinoma 4 mm grade 3 ER negative less than 1%, PR negative less than 1% and HER2 negative.  Patient is here to see surgery.  She is doing well overall and denies any specific complaints at this time.  No prior history of any abnormal mammograms or breast biopsies.  Family history significant for colon cancer in her brother.  No family history of breast cancer.  She is G1, P1.  ECOG PS- 0  Pain scale- 0   Review of systems- Review of Systems  Constitutional:  Negative for chills, fever, malaise/fatigue and weight loss.  HENT:  Negative for congestion, ear discharge and nosebleeds.   Eyes:  Negative for blurred vision.  Respiratory:  Negative for cough, hemoptysis, sputum production, shortness of breath and wheezing.   Cardiovascular:  Negative for chest pain, palpitations, orthopnea and claudication.  Gastrointestinal:  Negative for abdominal pain, blood in stool, constipation, diarrhea, heartburn, melena, nausea and vomiting.  Genitourinary:  Negative for dysuria, flank pain, frequency, hematuria and urgency.  Musculoskeletal:  Negative for back pain, joint pain and myalgias.  Skin:  Negative  for rash.  Neurological:  Negative for dizziness, tingling, focal weakness, seizures, weakness and headaches.  Endo/Heme/Allergies:  Does not bruise/bleed easily.  Psychiatric/Behavioral:  Negative for depression and suicidal ideas. The patient does not have insomnia.     Allergies  Allergen Reactions   Codeine     REACTION: syncope   Sumatriptan     REACTION: choking sensation    Patient Active Problem List   Diagnosis Date Noted   Medicare annual wellness visit, subsequent 02/15/2021   Osteopenia 01/31/2019   Estrogen deficiency 01/18/2019   Welcome to Medicare preventive visit 01/11/2019   Family history of colon cancer - brother in early 60's 10/21/2018   Encounter for screening mammogram for breast cancer 05/19/2017   Hyperlipidemia 12/10/2016   ETD (eustachian tube dysfunction) 11/27/2015   Encounter for routine gynecological examination 11/04/2014   Hemorrhoids, internal 04/02/2013   Routine general medical examination at a health care facility 06/09/2011   ALLERGIC RHINITIS 03/10/2009   History of shingles 08/28/2006     Past Medical History:  Diagnosis Date   Anemia    Cutaneous sarcoidosis    Degenerative disc disease    Dysplastic nevus 11/19/2017   L mid back 6.0 cm lat to spine   Foot pain    Hyperlipidemia    Migraine    Zoster    Opthy (regular checks)     Past Surgical History:  Procedure Laterality Date   2D Echo  1999   Negative   ABDOMINAL HYSTERECTOMY  03/1998   APPENDECTOMY     Arm lesion, cutaneous sarcoid  12/2005  with normal CXR and ANA   BREAST BIOPSY Left 10/23/2021   US biopsy/ heart clip/ path pending   CESAREAN SECTION     two times   COLONOSCOPY  11/0/2009,08/18/13   diverticulosis and hemorrhoids   DIAGNOSTIC LAPAROSCOPY     EYE SURGERY  10/13/2018   had shingles in left eye, had to have surgery, has contact lens in now.   LAPAROSCOPIC APPENDECTOMY N/A 02/18/2016   Procedure: APPENDECTOMY LAPAROSCOPIC;  Surgeon: Ralene Ok, MD;  Location: [facility];  Service: General;  Laterality: N/A;   LUMBAR LAMINECTOMY  10/2017   OOPHORECTOMY     ROTATOR CUFF REPAIR     TONSILLECTOMY     UPPER GASTROINTESTINAL ENDOSCOPY  11/15/2010   non-erosive gastritis, H. pylori negative    Social History   Socioeconomic History   Marital status: Married    Spouse name: Not on file   Number of children: Not on file   Years of education: Not on file   Highest education level: Not on file  Occupational History   Occupation: Works for [employer]    Employer: RETIRED  Tobacco Use   Smoking status: Never   Smokeless tobacco: Never  Vaping Use   Vaping Use: Never used  Substance and Sexual Activity   Alcohol use: No    Alcohol/week: 0.0 standard drinks of alcohol   Drug use: No   Sexual activity: Yes  Other Topics Concern   Not on file  Social History Narrative   Regular exercise:  Curves, elliptical, walking.   Social Determinants of Health   Financial Resource Strain: Low Risk  (02/01/2020)   Overall Financial Resource Strain (CARDIA)    Difficulty of Paying Living Expenses: Not hard at all  Food Insecurity: No Food Insecurity (02/01/2020)   Hunger Vital Sign    Worried About Running Out of Food in the Last Year: Never true    Ran Out of Food in the Last Year: Never true  Transportation Needs: No Transportation Needs (02/01/2020)   PRAPARE - Hydrologist (Medical): No    Lack of Transportation (Non-Medical): No  Physical Activity: Insufficiently Active (02/01/2020)   Exercise Vital Sign    Days of Exercise per Week: 4 days    Minutes of Exercise per Session: 30 min  Stress: No Stress Concern Present (02/01/2020)   [place]    Feeling of Stress : Not at all  Social Connections: Not on file  Intimate Partner Violence: Not At Risk (02/01/2020)   Humiliation, Afraid, Rape, and Kick questionnaire    Fear of Current  or Ex-Partner: No    Emotionally Abused: No    Physically Abused: No    Sexually Abused: No     Family History  Problem Relation Age of Onset   Colon cancer Brother        early 47's   Stomach cancer Brother    Heart disease Mother        CAD   Alcohol abuse Mother    Lung cancer Father    Heart disease Brother        CAD   Throat cancer Brother    Heart disease Brother        CAD, pre diabetic, obese   Colon polyps Sister    Breast cancer Neg Hx    Rectal cancer Neg Hx      Current Outpatient Medications:    aspirin 81 MG  tablet, Take 81 mg by mouth daily. , Disp: , Rfl:    atorvastatin (LIPITOR) 10 MG tablet, Take 1 tablet (10 mg total) by mouth daily., Disp: 90 tablet, Rfl: 3   Brinzolamide-Brimonidine (SIMBRINZA) 1-0.2 % SUSP, 1 drop twice a day by ophthalmic route., Disp: , Rfl:    Cholecalciferol (VITAMIN D3) 50 MCG (2000 UT) TABS, Take 1 tablet by mouth daily., Disp: , Rfl:    diazepam (VALIUM) 5 MG tablet, , Disp: , Rfl:    ferrous sulfate 325 (65 FE) MG tablet, Take 325 mg by mouth daily., Disp: , Rfl:    fluticasone (FLONASE) 50 MCG/ACT nasal spray, USE ONE SPRAY(S) IN EACH NOSTRIL ONCE DAILY AS DIRECTED, Disp: 48 g, Rfl: 3   gabapentin (NEURONTIN) 300 MG capsule, Take 300 mg by mouth daily after supper., Disp: , Rfl:    meloxicam (MOBIC) 15 MG tablet, Take 1 tablet (15 mg total) by mouth daily as needed for pain., Disp: 90 tablet, Rfl: 3   Multiple Vitamins-Minerals (ONE-A-DAY WOMENS 50 PLUS PO), Take 1 tablet by mouth daily., Disp: , Rfl:    prednisoLONE acetate (PRED FORTE) 1 % ophthalmic suspension, Place 1 drop into the left eye every other day., Disp: , Rfl:    Timolol Maleate 0.5 % (DAILY) SOLN, , Disp: , Rfl:    Physical exam:  Vitals:   10/29/21 1321  BP: (!) 155/75  Pulse: 69  Resp: 18  Temp: 98.3 F (36.8 C)  SpO2: 97%  Weight: 183 lb 9.6 oz (83.3 kg)  Height: 5' 3.5" (1.613 m)   Physical Exam Cardiovascular:     Rate and Rhythm: Normal rate  and regular rhythm.     Heart sounds: Normal heart sounds.  Pulmonary:     Effort: Pulmonary effort is normal.     Breath sounds: Normal breath sounds.  Abdominal:     General: Bowel sounds are normal.     Palpations: Abdomen is soft.  Skin:    General: Skin is warm and dry.  Neurological:     Mental Status: She is alert and oriented to person, place, and time.   Breast exam.  No palpable masses in bilateral breast.  No palpable bilateral axillary adenopathy    Latest Ref Rng & Units 02/15/2021   11:23 AM  CMP  Glucose 70 - 99 mg/dL 92   BUN 6 - 23 mg/dL 17   Creatinine 0.40 - 1.20 mg/dL 0.58   Sodium 135 - 145 mEq/L 140   Potassium 3.5 - 5.1 mEq/L 4.6   Chloride 96 - 112 mEq/L 104   CO2 19 - 32 mEq/L 30   Calcium 8.4 - 10.5 mg/dL 9.6   Total Protein 6.0 - 8.3 g/dL 7.0   Total Bilirubin 0.2 - 1.2 mg/dL 0.5   Alkaline Phos 39 - 117 U/L 88   AST 0 - 37 U/L 20   ALT 0 - 35 U/L 17       Latest Ref Rng & Units 02/15/2021   11:23 AM  CBC  WBC 4.0 - 10.5 K/uL 8.0   Hemoglobin 12.0 - 15.0 g/dL 12.0   Hematocrit 36.0 - 46.0 % 36.8   Platelets 150.0 - 400.0 K/uL 389.0     No images are attached to the encounter.  [place] LT BREAST BX W LOC DEV 1ST LESION IMG BX SPEC US GUIDE  Addendum Date: 10/24/2021   ADDENDUM REPORT: 10/24/2021 12:25 ADDENDUM: Pathology revealed: GRADE 3 INVASIVE MAMMARY CARCINOMA, NO SPECIAL TYPE of the  LEFT breast, 2 o'clock, 8 cmfn; Ultrasound guide core biopsy, (heart clip). This was found to be concordant by Dr. Peggye Fothergill. Pathology results were discussed with the patient by telephone. The patient reported doing well after the biopsy with tenderness at the site. Post biopsy instructions and care were reviewed and questions were answered. The patient was encouraged to call [facility] for any additional concerns. A surgical and medical oncologist referral will be arranged by Casper Harrison RN, Oncology Nurse  Navigator of [facility]. Pathology results reported by Stacie Acres RN on 10/24/2021. Electronically Signed   By: Evangeline Dakin M.D.   On: 10/24/2021 12:25   Result Date: 10/24/2021 CLINICAL DATA:  66 year old with a new indeterminate 0.6 cm mass involving the UPPER OUTER QUADRANT of the LEFT breast at 2 o'clock 8 cm from the nipple. EXAM: ULTRASOUND GUIDED LEFT BREAST CORE NEEDLE BIOPSY COMPARISON:  Previous exams. PROCEDURE: I met with the patient and we discussed the procedure of ultrasound-guided biopsy, including benefits and alternatives. We discussed the high likelihood of a successful procedure. We discussed the risks of the procedure, including infection, bleeding, tissue injury, clip migration, and inadequate sampling. Informed written consent was given. The usual time-out protocol was performed immediately prior to the procedure. Lesion quadrant: UPPER OUTER QUADRANT. Using sterile technique with chlorhexidine as skin antisepsis, 1% lidocaine and 1% lidocaine with epinephrine as local anesthetic, under direct ultrasound visualization, a 12 gauge Bard Marquee core needle device placed through an 11 gauge introducer needle was used to perform biopsy of the mass in the [place] using a lateral approach. At the conclusion of the procedure, a heart shaped tissue marker clip was deployed into the biopsy cavity. Follow up 2 view mammogram was performed and dictated separately. IMPRESSION: Ultrasound guided biopsy of a new indeterminate 0.6 cm mass involving the UPPER OUTER QUADRANT of the LEFT breast. No apparent complications. Electronically Signed: By: Evangeline Dakin M.D. On: 10/23/2021 09:31   MM CLIP PLACEMENT LEFT  Result Date: 10/23/2021 CLINICAL DATA:  Confirmation of clip placement after ultrasound-guided core needle biopsy of a new indeterminate mass involving the UPPER OUTER QUADRANT of the LEFT breast at 2 o'clock 8 cm nipple. EXAM: 2D and 3D  DIAGNOSTIC LEFT MAMMOGRAM POST ULTRASOUND BIOPSY COMPARISON:  Previous exam(s). FINDINGS: 2D and 3D full field CC and mediolateral images were obtained following ultrasound guided biopsy of a mass involving the UPPER OUTER QUADRANT of the LEFT breast. The heart shaped tissue marking clip is appropriately positioned at the posterosuperior margin of the biopsied mass in the [place] at middle to posterior depth. Expected post biopsy changes are present without evidence of hematoma. IMPRESSION: Appropriate positioning of the heart shaped biopsy marking clip at the posterosuperior margin of the biopsied mass in the UPPER OUTER QUADRANT of the LEFT breast at middle to posterior depth. Final Assessment: Post Procedure Mammograms for Marker Placement Electronically Signed   By: Evangeline Dakin M.D.   On: 10/23/2021 09:31  MM DIAG BREAST TOMO UNI LEFT  Result Date: 10/10/2021 CLINICAL DATA:  Recall from screening mammogram for an asymmetry in the left breast. EXAM: DIGITAL DIAGNOSTIC UNILATERAL LEFT MAMMOGRAM WITH TOMOSYNTHESIS AND CAD; ULTRASOUND LEFT BREAST LIMITED TECHNIQUE: Left digital diagnostic mammography and breast tomosynthesis was performed. The images were evaluated with computer-aided detection.; Targeted ultrasound examination of the left breast was performed. COMPARISON:  Previous exam(s). ACR Breast Density Category b: There are scattered  areas of fibroglandular density. FINDINGS: Additional views including spot compression demonstrate an obscured 4 mm mass in the upper-outer left breast. Targeted left breast ultrasound was performed. At 2 o'clock 8 cm from the nipple an indistinct hypoechoic mass measures 3 x 4 x 6 mm. This corresponds to the mass seen on the mammogram. No abnormally enlarged left axillary lymph node is identified. IMPRESSION: Indistinct hypoechoic mass in the left breast is suspicious for malignancy. RECOMMENDATION: Recommend ultrasound-guided left breast biopsy. I have  discussed the findings and recommendations with the patient. If applicable, a reminder letter will be sent to the patient regarding the next appointment. BI-RADS CATEGORY  4: Suspicious. Electronically Signed   By: Zerita Boers M.D.   On: 10/10/2021 14:45  US BREAST LTD UNI LEFT INC AXILLA  Result Date: 10/10/2021 CLINICAL DATA:  Recall from screening mammogram for an asymmetry in the left breast. EXAM: DIGITAL DIAGNOSTIC UNILATERAL LEFT MAMMOGRAM WITH TOMOSYNTHESIS AND CAD; ULTRASOUND LEFT BREAST LIMITED TECHNIQUE: Left digital diagnostic mammography and breast tomosynthesis was performed. The images were evaluated with computer-aided detection.; Targeted ultrasound examination of the left breast was performed. COMPARISON:  Previous exam(s). ACR Breast Density Category b: There are scattered areas of fibroglandular density. FINDINGS: Additional views including spot compression demonstrate an obscured 4 mm mass in the upper-outer left breast. Targeted left breast ultrasound was performed. At 2 o'clock 8 cm from the nipple an indistinct hypoechoic mass measures 3 x 4 x 6 mm. This corresponds to the mass seen on the mammogram. No abnormally enlarged left axillary lymph node is identified. IMPRESSION: Indistinct hypoechoic mass in the left breast is suspicious for malignancy. RECOMMENDATION: Recommend ultrasound-guided left breast biopsy. I have discussed the findings and recommendations with the patient. If applicable, a reminder letter will be sent to the patient regarding the next appointment. BI-RADS CATEGORY  4: Suspicious. Electronically Signed   By: Zerita Boers M.D.   On: 10/10/2021 14:45   Assessment and plan- Patient is a 66 y.o. female referred for newly diagnosed clinical prognostic stage I invasive mammary carcinoma of the left breast triple negative  Discussed with the patient results of mammogram ultrasound and final pathology with the patient in detail.  Based on mammogram and ultrasound she  has a 6 mm hypoechoic mass in the left breast which was biopsy-proven Triple negative breast cancer.  Size of the carcinoma on her biopsy specimen was 4 mm.  It remains to be seen as to how much of invasive cancer is still remaining in the final pathology  Given that her size of primary breast mass is overall small on the mammogram upfront lumpectomy and sentinel lymph node biopsy is what I would recommend.  I will defer any decisions for preoperative MRI to surgery.  I will await final pathology results.  Typically in triple negative breast cancers which are more than 5 mm in size adjuvant chemotherapy is recommended.  Chemotherapy somewhat controversial for tumors which are between 4 to 5 mm which is where the patient stands.  If there is more residual tumor noted on final pathology I would be inclined to offer adjuvant chemotherapy.    If there is no nodal involvement I would recommend 4 cycles of Taxotere and Cytoxan given every 3 weeks for 4 cycles.  Discussed risks and benefits of chemotherapy including all but not limited to nausea, vomiting, low blood counts, risk of infections and hospitalizations.  Treatment will be given with a curative intent.  Patient does not require to have a  port in place and if the decision is made to offer chemotherapy we can do this without a port.  Patient would also benefit from adjuvant radiation therapy after chemotherapy is completed.  There would be no role for adjuvant endocrine therapy and triple negative breast cancer. Patient understands and agrees to proceed as planned.   Cancer Staging  Malignant neoplasm of upper-outer quadrant of left breast in female, estrogen receptor negative ([place]) Staging form: Breast, AJCC 8th Edition - Clinical stage from 11/04/2021: Stage IB (cT1b, cN0, cM0, G3, ER-, PR-, HER2-) - Signed by Sindy Guadeloupe, MD on 11/04/2021 Histologic grading system: 3 grade system     Thank you for this kind referral and the opportunity to  participate in the care of this patient   Visit Diagnosis 1. Malignant neoplasm of upper-outer quadrant of left breast in female, estrogen receptor negative ([place])   2. Goals of care, counseling/discussion     Dr. Randa Evens, MD, MPH [facility] [phone] 11/04/2021 6:39 PM

## 2021-11-07 ENCOUNTER — Encounter: Payer: Self-pay | Admitting: Surgery

## 2021-11-07 ENCOUNTER — Ambulatory Visit (INDEPENDENT_AMBULATORY_CARE_PROVIDER_SITE_OTHER): Payer: Medicare PPO | Admitting: Surgery

## 2021-11-07 ENCOUNTER — Other Ambulatory Visit: Payer: Self-pay

## 2021-11-07 VITALS — BP 162/88 | HR 70 | Temp 98.5°F | Ht 63.5 in | Wt 183.6 lb

## 2021-11-07 DIAGNOSIS — Z171 Estrogen receptor negative status [ER-]: Secondary | ICD-10-CM

## 2021-11-07 DIAGNOSIS — C50412 Malignant neoplasm of upper-outer quadrant of left female breast: Secondary | ICD-10-CM | POA: Diagnosis not present

## 2021-11-07 NOTE — Progress Notes (Signed)
11/07/2021  Reason for Visit: Left breast cancer  Requesting Provider: Casper Harrison, RN  History of Present Illness: Megan Harrison is a 66 y.o. female presenting for evaluation of newly diagnosed left breast cancer.  The patient had an initial screening bilateral mammogram on 09/25/2021 which showed an area of asymmetry in the left breast.  She had left breast diagnostic mammogram as well as ultrasound on 10/10/2021 which showed a 4 mm mass in the upper outer left breast at 2 o'clock position, 8 cm from the nipple.  Ultrasound measurements were 3 x 4 x 6 mm.  Lymph nodes were normal-appearing.  She had a biopsy of this mass on 10/23/2021 and results showed invasive mammary carcinoma of no special type, grade 3, ER/PR/HER2 negative.  She recently had appointment with Dr. Janese Banks on 10/29/2021 who recommended upfront lumpectomy and sentinel lymph node biopsy given that the size of the primary breast mass is overall small.  She does not have a history of breast cancer in the family.  Her menstrual cycle started at age 69 she has had 2 pregnancies, the first 1 being at age 70.  She has been on both birth control and hormone therapy.  She does have a surgical history of a hysterectomy in 2000.  Overall she did not feel this mass and was a complete surprise to her as part of her screening process.  Past Medical History: Past Medical History:  Diagnosis Date   Anemia    Cutaneous sarcoidosis    Degenerative disc disease    Dysplastic nevus 11/19/2017   L mid back 6.0 cm lat to spine   Foot pain    Hyperlipidemia    Malignant neoplasm of upper-outer quadrant of left breast in female, estrogen receptor negative ([facility]) 11/04/2021   Migraine    Zoster    Opthy (regular checks)     Past Surgical History: Past Surgical History:  Procedure Laterality Date   2D Echo  1999   Negative   ABDOMINAL HYSTERECTOMY  03/1998   APPENDECTOMY     Arm lesion, cutaneous sarcoid  12/2005   with normal CXR and ANA   BREAST  BIOPSY Left 10/23/2021   US biopsy/ heart clip/ path pending   CESAREAN SECTION     two times   COLONOSCOPY  11/0/2009,08/18/13   diverticulosis and hemorrhoids   DIAGNOSTIC LAPAROSCOPY     EYE SURGERY  10/13/2018   had shingles in left eye, had to have surgery, has contact lens in now.   LAPAROSCOPIC APPENDECTOMY N/A 02/18/2016   Procedure: APPENDECTOMY LAPAROSCOPIC;  Surgeon: Ralene Ok, MD;  [facility];  Service: General;  Laterality: N/A;   LUMBAR LAMINECTOMY  10/2017   OOPHORECTOMY     ROTATOR CUFF REPAIR     TONSILLECTOMY     UPPER GASTROINTESTINAL ENDOSCOPY  11/15/2010   non-erosive gastritis, H. pylori negative    Home Medications: Prior to Admission medications   Medication Sig Start Date End Date Taking? Authorizing Provider  aspirin 81 MG tablet Take 81 mg by mouth daily.    Yes [provider]  atorvastatin (LIPITOR) 10 MG tablet Take 1 tablet (10 mg total) by mouth daily. 02/15/21  Yes Tower, Wynelle Fanny, MD  Cholecalciferol (VITAMIN D3) 50 MCG (2000 UT) TABS Take 1 tablet by mouth daily.   Yes [provider]  ferrous sulfate 325 (65 FE) MG tablet Take 325 mg by mouth daily.   Yes [provider]  fluticasone (FLONASE) 50 MCG/ACT nasal spray  USE ONE SPRAY(S) IN EACH NOSTRIL ONCE DAILY AS DIRECTED 02/15/21  Yes Tower, Wynelle Fanny, MD  gabapentin (NEURONTIN) 300 MG capsule Take 300 mg by mouth daily after supper.   Yes [provider]  meloxicam (MOBIC) 15 MG tablet Take 1 tablet (15 mg total) by mouth daily as needed for pain. 02/15/21  Yes Tower, Wynelle Fanny, MD  Multiple Vitamins-Minerals (ONE-A-DAY WOMENS 50 PLUS PO) Take 1 tablet by mouth daily.   Yes [provider]  prednisoLONE acetate (PRED FORTE) 1 % ophthalmic suspension Place 1 drop into the left eye every other day.   Yes [provider]  Timolol Maleate 0.5 % (DAILY) SOLN  08/16/16  Yes [provider]    Allergies: Allergies  Allergen Reactions    Codeine     REACTION: syncope   Sumatriptan     REACTION: choking sensation    Social History:  reports that she has never smoked. She has never used smokeless tobacco. She reports that she does not drink alcohol and does not use drugs.   Family History: Family History  Problem Relation Age of Onset   Colon cancer Brother        early 39's   Stomach cancer Brother    Heart disease Mother        CAD   Alcohol abuse Mother    Lung cancer Father    Heart disease Brother        CAD   Throat cancer Brother    Heart disease Brother        CAD, pre diabetic, obese   Colon polyps Sister    Breast cancer Neg Hx    Rectal cancer Neg Hx     Review of Systems: Review of Systems  Constitutional:  Negative for chills and fever.  HENT:  Negative for hearing loss.   Respiratory:  Negative for shortness of breath.   Cardiovascular:  Negative for chest pain.  Gastrointestinal:  Negative for abdominal pain, nausea and vomiting.  Genitourinary:  Negative for dysuria.  Musculoskeletal:  Negative for myalgias.  Skin:  Negative for rash.  Neurological:  Negative for dizziness.  Psychiatric/Behavioral:  Negative for depression.     Physical Exam BP (!) 162/88   Pulse 70   Temp 98.5 F (36.9 C) (Oral)   Ht 5' 3.5" (1.613 m)   Wt 183 lb 9.6 oz (83.3 kg)   SpO2 98%   BMI 32.01 kg/m  CONSTITUTIONAL: No acute distress, well-nourished HEENT:  Normocephalic, atraumatic, extraocular motion intact. NECK: Trachea is midline, and there is no jugular venous distension.  RESPIRATORY:  Lungs are clear, and breath sounds are equal bilaterally. Normal respiratory effort without pathologic use of accessory muscles. CARDIOVASCULAR: Heart is regular without murmurs, gallops, or rubs. BREAST: Left breast status post biopsy in the lateral approach with mild soreness in that area.  Overall no bruising.  No real palpable masses, other skin changes, or nipple changes.  No left axillary lymphadenopathy.   Right breast without any palpable masses, skin changes, or nipple changes.  No right axillary lymphadenopathy. MUSCULOSKELETAL:  Normal muscle strength and tone in all four extremities.  No peripheral edema or cyanosis. SKIN: Skin turgor is normal. There are no pathologic skin lesions.  NEUROLOGIC:  Motor and sensation is grossly normal.  Cranial nerves are grossly intact. PSYCH:  Alert and oriented to person, place and time. Affect is normal.  Laboratory Analysis: Left breast biopsy on 10/23/2021: DIAGNOSIS:  A. BREAST, LEFT, 2 O'CLOCK 8  CM FROM NIPPLE; ULTRASOUND-GUIDED CORE BIOPSY:  - INVASIVE MAMMARY CARCINOMA, NO SPECIAL TYPE.  Size of invasive carcinoma: 4 mm in this sample  Histologic grade of invasive carcinoma: Grade 3                       Glandular/tubular differentiation score: 3                       Nuclear pleomorphism score: 2                       Mitotic rate score: 3                       Total score: 8  Ductal carcinoma in situ: Not identified  Lymphovascular invasion: Not identified  Estrogen Receptor (ER) Status: NEGATIVE (LESS THAN 1%)  Progesterone Receptor (PgR) Status: NEGATIVE (LESS THAN 1%)          Internal control cells NEGATIVE (LESS THAN 1%)          Internal control cells present  HER2 (by immunohistochemistry): NEGATIVE (Score 0)  Ki-67: Not performed   Imaging: Left mammogram and ultrasound on 10/10/2021: FINDINGS: Additional views including spot compression demonstrate an obscured 4 mm mass in the upper-outer left breast.   Targeted left breast ultrasound was performed.   At 2 o'clock 8 cm from the nipple an indistinct hypoechoic mass measures 3 x 4 x 6 mm. This corresponds to the mass seen on the mammogram.   No abnormally enlarged left axillary lymph node is identified.   IMPRESSION: Indistinct hypoechoic mass in the left breast is suspicious for malignancy.   RECOMMENDATION: Recommend ultrasound-guided left breast biopsy.   I have  discussed the findings and recommendations with the patient. If applicable, a reminder letter will be sent to the patient regarding the next appointment.   BI-RADS CATEGORY  4: Suspicious.    Assessment and Plan: [name] with newly diagnosed left breast cancer of the upper outer quadrant.  - Discussed with the patient again the findings on her mammogram and the results from the biopsy.  She does have left breast cancer which is ER/PR/HER2 negative.  The patient has met with Dr. Janese Banks and she recommends upfront lumpectomy and sentinel lymph node biopsy given the small size of this mass.  Discussed with the patient the different steps in order to proceed with lumpectomy and sentinel lymph node biopsy.  First discussed the need for a radiofrequency tag placement to help [place] localize this mass better given the small size.  This will help [place] identify the correct area and the shortest distance from the skin in order to make our incision more accurate.  Then, discussed with her the day of surgery and how she would first proceed to nuclear medicine for injection of radioactive tracer for the sentinel node biopsy followed by surgery itself which would include 2 incisions, 1 for the axilla and 1 for the breast.  Discussed with her the use of methylene blue dye in order to more accurately evaluate and identify the sentinel lymph node.  Overall discussed with her the surgery at length including the risks of bleeding, infection, injury to surrounding structures, lymphedema, that this an outpatient surgery, postoperative pain control, the use of a breast binder postoperatively, and she is willing to proceed. - We will schedule the patient for surgery on 11/20/2021. - The patient  currently takes aspirin 81 mg daily, and discussed that the last dose for aspirin will be on 11/14/2021.  We will also send for medical clearance to her PCP. -All of her questions have been answered.  I spent 80 minutes dedicated  to the care of this patient on the date of this encounter to include pre-visit review of records, face-to-face time with the patient discussing diagnosis and management, and any post-visit coordination of care.   Melvyn Neth, [facility]

## 2021-11-07 NOTE — Patient Instructions (Addendum)
Stop the Aspirin 11/14/21. We will let you know when to restart the Aspirin.   Medical Clearance sent to [facility]. Please call their office to see if you need an office visit prior to the Medical Clearance.  Our surgery scheduler will call you within 24-48 hours to schedule your surgery. Please have the [facility] surgery sheet available when speaking with her.    Lumpectomy  A lumpectomy, sometimes called a partial mastectomy, is surgery to remove a cancerous tumor or mass (the lump) from a breast. It is a form of breast-conserving or breast-preservation surgery. This means that the cancerous tissue is removed but the breast remains intact. During a lumpectomy, the portion of the breast that contains the tumor is removed. Some normal tissue around the lump may be taken out to make sure that all of the tumor has been removed. Lymph nodes under your arm may also be removed and tested to find out if the cancer has spread. Lymph nodes are part of the body's disease-fighting system (immune system) and are usually the first place where breast cancer spreads. Tell a health care provider about: Any allergies you have. All medicines you are taking, including vitamins, herbs, eye drops, creams, and over-the-counter medicines. Any problems you or family members have had with anesthetic medicines. Any blood disorders you have. Any surgeries you have had. Any medical conditions you have. Whether you are pregnant or may be pregnant. What are the risks? Generally, this is a safe procedure. However, problems may occur, including: Bleeding. Infection. Allergic reaction to medicines. Pain, swelling, weakness, or numbness in the arm on the side of your surgery. Temporary swelling. Change in the shape of the breast, particularly if a large portion is removed. Scar tissue that forms at the surgical site and feels hard to the touch. Blood clots. What happens before the procedure? Staying hydrated Follow  instructions from your health care provider about hydration, which may include: Up to 2 hours before the procedure - you may continue to drink clear liquids, such as water, clear fruit juice, black coffee, and plain tea.  Eating and drinking restrictions Follow instructions from your health care provider about eating and drinking, which may include: 8 hours before the procedure - stop eating heavy meals or foods, such as meat, fried foods, or fatty foods. 6 hours before the procedure - stop eating light meals or foods, such as toast or cereal. 6 hours before the procedure - stop drinking milk or drinks that contain milk. 2 hours before the procedure - stop drinking clear liquids. Medicines Ask your health care provider about: Changing or stopping your regular medicines. This is especially important if you are taking diabetes medicines or blood thinners. Taking medicines such as aspirin and ibuprofen. These medicines can thin your blood. Do not take these medicines unless your health care provider tells you to take them. Taking over-the-counter medicines, vitamins, herbs, and supplements. General instructions Prior to surgery, your health care provider may do a procedure to locate and mark the tumor area in your breast (localization). This will help guide your surgeon to where the incision will be made. This may be done with: Imaging, such as a mammogram, ultrasound, or MRI. Insertion of a small wire, clip, or seed, or an implant that will reflect a radar signal. You may have screening tests or exams to get baseline measurements of your arm. These can be compared to measurements done after surgery to monitor for swelling (lymphedema) that can develop after having lymph  nodes removed. Ask your health care provider: How your surgery site will be marked. What steps will be taken to help prevent infection. These may include: Washing skin with a germ-killing soap. Taking antibiotic medicine. Plan  to have someone take you home from the hospital or clinic. Plan to have a responsible adult care for you for at least 24 hours after you leave the hospital or clinic. This is important. What happens during the procedure?  An IV will be inserted into one of your veins. You will be given one or more of the following: A medicine to help you relax (sedative). A medicine to numb the area (local anesthetic). A medicine to make you fall asleep (general anesthetic). Your health care provider will use a kind of electric scalpel that uses heat to reduce bleeding (electrocautery knife). A curved incision that follows the natural curve of your breast will be made. This type of incision will allow for minimal scarring and better healing. The tumor will be removed along with some of the tissue around it. This will be sent to the lab for testing. Your health care provider may also remove lymph nodes at this time if needed. If the tumor is close to the muscles over your chest, some muscle tissue may also be removed. A small drain tube may be inserted into your breast area or armpit to collect fluid that may build up after surgery. This tube will be connected to a suction bulb on the outside of your body to remove the fluid. The incision will be closed with stitches (sutures). A bandage (dressing) may be placed over the incision. The procedure may vary among health care providers and hospitals. What happens after the procedure? Your blood pressure, heart rate, breathing rate, and blood oxygen level will be monitored until you leave the hospital or clinic. You will be given medicine for pain as needed. Your IV will be removed when you are able to eat and drink by mouth. You will be encouraged to get up and walk as soon as you can. This is important to improve blood flow and breathing. Ask for help if you feel weak or unsteady. You may have: A drain tube in place for 2-3 days to prevent a collection of blood  (hematoma) from developing in the breast. You will be given instructions about caring for the drain before you go home. A pressure bandage applied for 1-2 days to prevent bleeding or swelling. Your pressure bandage may look like a thick piece of fabric or an elastic wrap. Ask your health care provider how to care for your bandage at home. You may be given a tight sleeve to wear over your arm on the side of your surgery. You should wear this sleeve as told by your health care provider. Do not drive for 24 hours if you were given a sedative during your procedure. Summary A lumpectomy, sometimes called a partial mastectomy, is surgery to remove a cancerous tumor or mass (the lump) from a breast. During a lumpectomy, the portion of the breast that contains the tumor is removed. Lymph nodes under your arm may also be removed and tested to find out if the cancer has spread. Plan to have someone take you home from the hospital or clinic. You may have a drain tube in place for 2-3 days to prevent a collection of blood (hematoma) from developing in the breast. You will be given instructions about caring for the drain before you go home. This information  is not intended to replace advice given to you by your health care provider. Make sure you discuss any questions you have with your health care provider. Document Revised: 10/26/2018 Document Reviewed: 10/26/2018 Elsevier Patient Education  [place].

## 2021-11-07 NOTE — H&P (View-Only) (Signed)
11/07/2021  Reason for Visit: Left breast cancer  Requesting Provider: Casper Harrison, RN  History of Present Illness: Megan Harrison is a 66 y.o. female presenting for evaluation of newly diagnosed left breast cancer.  The patient had an initial screening bilateral mammogram on 09/25/2021 which showed an area of asymmetry in the left breast.  She had left breast diagnostic mammogram as well as ultrasound on 10/10/2021 which showed a 4 mm mass in the upper outer left breast at 2 o'clock position, 8 cm from the nipple.  Ultrasound measurements were 3 x 4 x 6 mm.  Lymph nodes were normal-appearing.  She had a biopsy of this mass on 10/23/2021 and results showed invasive mammary carcinoma of no special type, grade 3, ER/PR/HER2 negative.  She recently had appointment with Dr. Janese Banks on 10/29/2021 who recommended upfront lumpectomy and sentinel lymph node biopsy given that the size of the primary breast mass is overall small.  She does not have a history of breast cancer in the family.  Her menstrual cycle started at age 8 she has had 2 pregnancies, the first 1 being at age 2.  She has been on both birth control and hormone therapy.  She does have a surgical history of a hysterectomy in 2000.  Overall she did not feel this mass and was a complete surprise to her as part of her screening process.  Past Medical History: Past Medical History:  Diagnosis Date   Anemia    Cutaneous sarcoidosis    Degenerative disc disease    Dysplastic nevus 11/19/2017   L mid back 6.0 cm lat to spine   Foot pain    Hyperlipidemia    Malignant neoplasm of upper-outer quadrant of left breast in female, estrogen receptor negative ([place]) 11/04/2021   Migraine    Zoster    Opthy (regular checks)     Past Surgical History: Past Surgical History:  Procedure Laterality Date   2D Echo  1999   Negative   ABDOMINAL HYSTERECTOMY  03/1998   APPENDECTOMY     Arm lesion, cutaneous sarcoid  12/2005   with normal CXR and ANA   BREAST  BIOPSY Left 10/23/2021   US biopsy/ heart clip/ path pending   CESAREAN SECTION     two times   COLONOSCOPY  11/0/2009,08/18/13   diverticulosis and hemorrhoids   DIAGNOSTIC LAPAROSCOPY     EYE SURGERY  10/13/2018   had shingles in left eye, had to have surgery, has contact lens in now.   LAPAROSCOPIC APPENDECTOMY N/A 02/18/2016   Procedure: APPENDECTOMY LAPAROSCOPIC;  Surgeon: Ralene Ok, MD;  Location: [facility];  Service: General;  Laterality: N/A;   LUMBAR LAMINECTOMY  10/2017   OOPHORECTOMY     ROTATOR CUFF REPAIR     TONSILLECTOMY     UPPER GASTROINTESTINAL ENDOSCOPY  11/15/2010   non-erosive gastritis, H. pylori negative    Home Medications: Prior to Admission medications   Medication Sig Start Date End Date Taking? Authorizing Provider  aspirin 81 MG tablet Take 81 mg by mouth daily.    Yes [provider]  atorvastatin (LIPITOR) 10 MG tablet Take 1 tablet (10 mg total) by mouth daily. 02/15/21  Yes Tower, Wynelle Fanny, MD  Cholecalciferol (VITAMIN D3) 50 MCG (2000 UT) TABS Take 1 tablet by mouth daily.   Yes [provider]  ferrous sulfate 325 (65 FE) MG tablet Take 325 mg by mouth daily.   Yes [provider]  fluticasone (FLONASE) 50 MCG/ACT nasal spray  USE ONE SPRAY(S) IN EACH NOSTRIL ONCE DAILY AS DIRECTED 02/15/21  Yes Tower, Wynelle Fanny, MD  gabapentin (NEURONTIN) 300 MG capsule Take 300 mg by mouth daily after supper.   Yes [provider]  meloxicam (MOBIC) 15 MG tablet Take 1 tablet (15 mg total) by mouth daily as needed for pain. 02/15/21  Yes Tower, Wynelle Fanny, MD  Multiple Vitamins-Minerals (ONE-A-DAY WOMENS 50 PLUS PO) Take 1 tablet by mouth daily.   Yes [provider]  prednisoLONE acetate (PRED FORTE) 1 % ophthalmic suspension Place 1 drop into the left eye every other day.   Yes [provider]  Timolol Maleate 0.5 % (DAILY) SOLN  08/16/16  Yes [provider]    Allergies: Allergies  Allergen Reactions    Codeine     REACTION: syncope   Sumatriptan     REACTION: choking sensation    Social History:  reports that she has never smoked. She has never used smokeless tobacco. She reports that she does not drink alcohol and does not use drugs.   Family History: Family History  Problem Relation Age of Onset   Colon cancer Brother        early 81's   Stomach cancer Brother    Heart disease Mother        CAD   Alcohol abuse Mother    Lung cancer Father    Heart disease Brother        CAD   Throat cancer Brother    Heart disease Brother        CAD, pre diabetic, obese   Colon polyps Sister    Breast cancer Neg Hx    Rectal cancer Neg Hx     Review of Systems: Review of Systems  Constitutional:  Negative for chills and fever.  HENT:  Negative for hearing loss.   Respiratory:  Negative for shortness of breath.   Cardiovascular:  Negative for chest pain.  Gastrointestinal:  Negative for abdominal pain, nausea and vomiting.  Genitourinary:  Negative for dysuria.  Musculoskeletal:  Negative for myalgias.  Skin:  Negative for rash.  Neurological:  Negative for dizziness.  Psychiatric/Behavioral:  Negative for depression.     Physical Exam BP (!) 162/88   Pulse 70   Temp 98.5 F (36.9 C) (Oral)   Ht 5' 3.5" (1.613 m)   Wt 183 lb 9.6 oz (83.3 kg)   SpO2 98%   BMI 32.01 kg/m  CONSTITUTIONAL: No acute distress, well-nourished HEENT:  Normocephalic, atraumatic, extraocular motion intact. NECK: Trachea is midline, and there is no jugular venous distension.  RESPIRATORY:  Lungs are clear, and breath sounds are equal bilaterally. Normal respiratory effort without pathologic use of accessory muscles. CARDIOVASCULAR: Heart is regular without murmurs, gallops, or rubs. BREAST: Left breast status post biopsy in the lateral approach with mild soreness in that area.  Overall no bruising.  No real palpable masses, other skin changes, or nipple changes.  No left axillary lymphadenopathy.   Right breast without any palpable masses, skin changes, or nipple changes.  No right axillary lymphadenopathy. MUSCULOSKELETAL:  Normal muscle strength and tone in all four extremities.  No peripheral edema or cyanosis. SKIN: Skin turgor is normal. There are no pathologic skin lesions.  NEUROLOGIC:  Motor and sensation is grossly normal.  Cranial nerves are grossly intact. PSYCH:  Alert and oriented to person, place and time. Affect is normal.  Laboratory Analysis: Left breast biopsy on 10/23/2021: DIAGNOSIS:  A. BREAST, LEFT, 2 O'CLOCK 8  CM FROM NIPPLE; ULTRASOUND-GUIDED CORE BIOPSY:  - INVASIVE MAMMARY CARCINOMA, NO SPECIAL TYPE.  Size of invasive carcinoma: 4 mm in this sample  Histologic grade of invasive carcinoma: Grade 3                       Glandular/tubular differentiation score: 3                       Nuclear pleomorphism score: 2                       Mitotic rate score: 3                       Total score: 8  Ductal carcinoma in situ: Not identified  Lymphovascular invasion: Not identified  Estrogen Receptor (ER) Status: NEGATIVE (LESS THAN 1%)  Progesterone Receptor (PgR) Status: NEGATIVE (LESS THAN 1%)          Internal control cells NEGATIVE (LESS THAN 1%)          Internal control cells present  HER2 (by immunohistochemistry): NEGATIVE (Score 0)  Ki-67: Not performed   Imaging: Left mammogram and ultrasound on 10/10/2021: FINDINGS: Additional views including spot compression demonstrate an obscured 4 mm mass in the upper-outer left breast.   Targeted left breast ultrasound was performed.   At 2 o'clock 8 cm from the nipple an indistinct hypoechoic mass measures 3 x 4 x 6 mm. This corresponds to the mass seen on the mammogram.   No abnormally enlarged left axillary lymph node is identified.   IMPRESSION: Indistinct hypoechoic mass in the left breast is suspicious for malignancy.   RECOMMENDATION: Recommend ultrasound-guided left breast biopsy.   I have  discussed the findings and recommendations with the patient. If applicable, a reminder letter will be sent to the patient regarding the next appointment.   BI-RADS CATEGORY  4: Suspicious.    Assessment and Plan: [name] with newly diagnosed left breast cancer of the upper outer quadrant.  - Discussed with the patient again the findings on her mammogram and the results from the biopsy.  She does have left breast cancer which is ER/PR/HER2 negative.  The patient has met with Dr. Rao and she recommends upfront lumpectomy and sentinel lymph node biopsy given the small size of this mass.  Discussed with the patient the different steps in order to proceed with lumpectomy and sentinel lymph node biopsy.  First discussed the need for a radiofrequency tag placement to help us localize this mass better given the small size.  This will help us identify the correct area and the shortest distance from the skin in order to make our incision more accurate.  Then, discussed with her the day of surgery and how she would first proceed to nuclear medicine for injection of radioactive tracer for the sentinel node biopsy followed by surgery itself which would include 2 incisions, 1 for the axilla and 1 for the breast.  Discussed with her the use of methylene blue dye in order to more accurately evaluate and identify the sentinel lymph node.  Overall discussed with her the surgery at length including the risks of bleeding, infection, injury to surrounding structures, lymphedema, that this an outpatient surgery, postoperative pain control, the use of a breast binder postoperatively, and she is willing to proceed. - We will schedule the patient for surgery on 11/20/2021. - The patient   currently takes aspirin 81 mg daily, and discussed that the last dose for aspirin will be on 11/14/2021.  We will also send for medical clearance to her PCP. -All of her questions have been answered.  I spent 80 minutes dedicated  to the care of this patient on the date of this encounter to include pre-visit review of records, face-to-face time with the patient discussing diagnosis and management, and any post-visit coordination of care.   Ansh Fauble Luis Maximiliano Cromartie, MD [facility]    

## 2021-11-08 ENCOUNTER — Other Ambulatory Visit: Payer: Self-pay | Admitting: Surgery

## 2021-11-08 ENCOUNTER — Encounter: Payer: Self-pay | Admitting: *Deleted

## 2021-11-08 ENCOUNTER — Other Ambulatory Visit: Payer: Self-pay

## 2021-11-08 DIAGNOSIS — R928 Other abnormal and inconclusive findings on diagnostic imaging of breast: Secondary | ICD-10-CM

## 2021-11-08 DIAGNOSIS — C50412 Malignant neoplasm of upper-outer quadrant of left female breast: Secondary | ICD-10-CM

## 2021-11-08 NOTE — Progress Notes (Signed)
Megan Harrison met with Dr. Hampton Abbot yesterday.   Her surgery is scheduled for 11/20/21.   Dr. Elroy Channel appointment moved out to 11/30/21.  Patient is aware of appt. Dates and times.   No questions or concerns.

## 2021-11-09 ENCOUNTER — Telehealth: Payer: Self-pay | Admitting: Surgery

## 2021-11-09 NOTE — Telephone Encounter (Signed)
Patient has been advised of Pre-Admission date/time, and Surgery date.  Surgery Date: 11/20/21 arrive at 8:30 am for SLN bx injection to be done prior to surgery.  Preadmission Testing Date: 11/15/21 (phone 8a-1p)  Patient also reminded of RF tag placement with [facility] on 11/13/21.   Patient verbalized understanding.

## 2021-11-13 ENCOUNTER — Ambulatory Visit
Admission: RE | Admit: 2021-11-13 | Discharge: 2021-11-13 | Disposition: A | Discharge status: Home / Self Care | Payer: Medicare PPO | Source: Ambulatory Visit | Attending: Surgery | Admitting: Surgery

## 2021-11-13 DIAGNOSIS — R928 Other abnormal and inconclusive findings on diagnostic imaging of breast: Secondary | ICD-10-CM | POA: Insufficient documentation

## 2021-11-13 DIAGNOSIS — C50912 Malignant neoplasm of unspecified site of left female breast: Secondary | ICD-10-CM | POA: Diagnosis not present

## 2021-11-13 HISTORY — PX: BREAST LUMPECTOMY WITH RADIOFREQUENCY TAG IDENTIFICATION: SHX6884

## 2021-11-15 ENCOUNTER — Encounter
Admission: RE | Admit: 2021-11-15 | Discharge: 2021-11-15 | Disposition: A | Discharge status: Home / Self Care | Payer: Medicare PPO | Source: Ambulatory Visit | Attending: Surgery | Admitting: Surgery

## 2021-11-15 DIAGNOSIS — D649 Anemia, unspecified: Secondary | ICD-10-CM

## 2021-11-15 DIAGNOSIS — Z01818 Encounter for other preprocedural examination: Secondary | ICD-10-CM

## 2021-11-15 HISTORY — DX: Diverticulosis of intestine, part unspecified, without perforation or abscess without bleeding: K57.90

## 2021-11-15 HISTORY — DX: Gastro-esophageal reflux disease without esophagitis: K21.9

## 2021-11-15 HISTORY — DX: Zoster without complications: B02.9

## 2021-11-15 NOTE — Patient Instructions (Signed)
Your procedure is scheduled on:11-20-21 Tuesday Report to the Registration Desk on the 1st floor of the [facility]. Then proceed to the Radiology Desk (2nd desk on right) Arrive at 8:30 am  REMEMBER: Instructions that are not followed completely may result in serious medical risk, up to and including death; or upon the discretion of your surgeon and anesthesiologist your surgery may need to be rescheduled.  Do not eat food after midnight the night before surgery.  No gum chewing, lozengers or hard candies.  You may however, drink CLEAR liquids up to 2 hours before you are scheduled to arrive for your surgery. Do not drink anything within 2 hours of your scheduled arrival time.  Clear liquids include: - water  - apple juice without pulp - gatorade (not RED colors) - black coffee or tea (Do NOT add milk or creamers to the coffee or tea) Do NOT drink anything that is not on this list.  Do NOT take any medication the day of surgery  Last dose of 81 mg Aspirin was on 11-14-21 as instructed by Dr Hampton Abbot  One week prior to surgery: Stop Anti-inflammatories (NSAIDS) such as meloxicam (MOBIC), Advil, Aleve, Ibuprofen, Motrin, Naproxen, Naprosyn and Aspirin based products such as Excedrin, Goodys Powder, BC Powder.You may however, take Tylenol if needed for pain up until the day of surgery.  Stop ANY OVER THE COUNTER supplements/vitamins NOW (11-15-21) until after surgery (vitamin d3, ferrous sulfate and multivitamin)  No Alcohol for 24 hours before or after surgery.  No Smoking including e-cigarettes for 24 hours prior to surgery.  No chewable tobacco products for at least 6 hours prior to surgery.  No nicotine patches on the day of surgery.  Do not use any "recreational" drugs for at least a week prior to your surgery.  Please be advised that the combination of cocaine and anesthesia may have negative outcomes, up to and including death. If you test positive for cocaine, your surgery will  be cancelled.  On the morning of surgery brush your teeth with toothpaste and water, you may rinse your mouth with mouthwash if you wish. Do not swallow any toothpaste or mouthwash.  Use CHG Soap as directed on instruction sheet.  Do not wear jewelry, make-up, hairpins, clips or nail polish.  Do not wear lotions, powders, or perfumes.   Do not shave body from the neck down 48 hours prior to surgery just in case you cut yourself which could leave a site for infection.  Also, freshly shaved skin may become irritated if using the CHG soap.  Contact lenses, hearing aids and dentures may not be worn into surgery.  Do not bring valuables to the hospital. [facility] is not responsible for any missing/lost belongings or valuables.   Notify your doctor if there is any change in your medical condition (cold, fever, infection).  Wear comfortable clothing (specific to your surgery type) to the hospital.  After surgery, you can help prevent lung complications by doing breathing exercises.  Take deep breaths and cough every 1-2 hours. Your doctor may order a device called an Incentive Spirometer to help you take deep breaths. When coughing or sneezing, hold a pillow firmly against your incision with both hands. This is called "splinting." Doing this helps protect your incision. It also decreases belly discomfort.  If you are being admitted to the hospital overnight, leave your suitcase in the car. After surgery it may be brought to your room.  If you are being discharged the day  of surgery, you will not be allowed to drive home. You will need a responsible adult (18 years or older) to drive you home and stay with you that night.   If you are taking public transportation, you will need to have a responsible adult (18 years or older) with you. Please confirm with your physician that it is acceptable to use public transportation.   Please call the [facility] at [phone] if  you have any questions about these instructions.  Surgery Visitation Policy:  Patients undergoing a surgery or procedure may have two family members or support persons with them as long as the person is not COVID-19 positive or experiencing its symptoms.

## 2021-11-16 ENCOUNTER — Encounter
Admission: RE | Admit: 2021-11-16 | Discharge: 2021-11-16 | Disposition: A | Discharge status: Home / Self Care | Payer: Medicare PPO | Source: Ambulatory Visit | Attending: Surgery | Admitting: Surgery

## 2021-11-16 DIAGNOSIS — D649 Anemia, unspecified: Secondary | ICD-10-CM | POA: Diagnosis not present

## 2021-11-16 DIAGNOSIS — Z01818 Encounter for other preprocedural examination: Secondary | ICD-10-CM | POA: Insufficient documentation

## 2021-11-16 DIAGNOSIS — Z01810 Encounter for preprocedural cardiovascular examination: Secondary | ICD-10-CM | POA: Diagnosis not present

## 2021-11-16 LAB — BASIC METABOLIC PANEL
Anion gap: 7 (ref 5–15)
BUN: 20 mg/dL (ref 8–23)
CO2: 28 mmol/L (ref 22–32)
Calcium: 9.5 mg/dL (ref 8.9–10.3)
Chloride: 106 mmol/L (ref 98–111)
Creatinine, Ser: 0.77 mg/dL (ref 0.44–1.00)
GFR, Estimated: >60 mL/min (ref >60)
Glucose, Bld: 115 mg/dL — ABNORMAL HIGH (ref 70–99)
Potassium: 3.9 mmol/L (ref 3.5–5.1)
Sodium: 141 mmol/L (ref 135–145)

## 2021-11-16 LAB — CBC
HCT: 37.3 % (ref 36.0–46.0)
Hemoglobin: 12.2 g/dL (ref 12.0–15.0)
MCH: 30.1 pg (ref 26.0–34.0)
MCHC: 32.7 g/dL (ref 30.0–36.0)
MCV: 92.1 fL (ref 80.0–100.0)
Platelets: 317 10*3/uL (ref 150–400)
RBC: 4.05 MIL/uL (ref 3.87–5.11)
RDW: 13 % (ref 11.5–15.5)
WBC: 8.9 10*3/uL (ref 4.0–10.5)
nRBC: 0 % (ref 0.0–0.2)

## 2021-11-19 ENCOUNTER — Ambulatory Visit: Payer: Medicare PPO | Admitting: Oncology

## 2021-11-20 ENCOUNTER — Ambulatory Visit
Admission: RE | Admit: 2021-11-20 | Discharge: 2021-11-20 | Disposition: A | Discharge status: Home / Self Care | Payer: Medicare PPO | Source: Ambulatory Visit | Attending: Surgery | Admitting: Surgery

## 2021-11-20 ENCOUNTER — Ambulatory Visit: Payer: Medicare PPO | Admitting: Anesthesiology

## 2021-11-20 ENCOUNTER — Other Ambulatory Visit: Payer: Self-pay

## 2021-11-20 ENCOUNTER — Ambulatory Visit
Admission: RE | Admit: 2021-11-20 | Discharge: 2021-11-20 | Disposition: A | Discharge status: Home / Self Care | Payer: Medicare PPO | Attending: Surgery | Admitting: Surgery

## 2021-11-20 ENCOUNTER — Encounter
Admission: RE | Disposition: A | Discharge status: Home / Self Care | Payer: Self-pay | Source: Home / Self Care | Attending: Surgery

## 2021-11-20 DIAGNOSIS — C50912 Malignant neoplasm of unspecified site of left female breast: Secondary | ICD-10-CM | POA: Diagnosis not present

## 2021-11-20 DIAGNOSIS — Z7982 Long term (current) use of aspirin: Secondary | ICD-10-CM | POA: Insufficient documentation

## 2021-11-20 DIAGNOSIS — D863 Sarcoidosis of skin: Secondary | ICD-10-CM | POA: Diagnosis not present

## 2021-11-20 DIAGNOSIS — Z90710 Acquired absence of both cervix and uterus: Secondary | ICD-10-CM | POA: Diagnosis not present

## 2021-11-20 DIAGNOSIS — D649 Anemia, unspecified: Secondary | ICD-10-CM | POA: Insufficient documentation

## 2021-11-20 DIAGNOSIS — R928 Other abnormal and inconclusive findings on diagnostic imaging of breast: Secondary | ICD-10-CM

## 2021-11-20 DIAGNOSIS — Z171 Estrogen receptor negative status [ER-]: Secondary | ICD-10-CM | POA: Insufficient documentation

## 2021-11-20 DIAGNOSIS — C50412 Malignant neoplasm of upper-outer quadrant of left female breast: Secondary | ICD-10-CM | POA: Insufficient documentation

## 2021-11-20 DIAGNOSIS — E785 Hyperlipidemia, unspecified: Secondary | ICD-10-CM | POA: Diagnosis not present

## 2021-11-20 HISTORY — PX: BREAST LUMPECTOMY,RADIO FREQ LOCALIZER,AXILLARY SENTINEL LYMPH NODE BIOPSY: SHX6900

## 2021-11-20 SURGERY — BREAST LUMPECTOMY,RADIO FREQ LOCALIZER,AXILLARY SENTINEL LYMPH NODE BIOPSY
Anesthesia: General | Site: Breast | Laterality: Left

## 2021-11-20 MED ORDER — KETOROLAC TROMETHAMINE 30 MG/ML IJ SOLN
INTRAMUSCULAR | Status: DC | PRN
  Administered 2021-11-20: 15 mg via INTRAVENOUS

## 2021-11-20 MED ORDER — LACTATED RINGERS IV SOLN: INTRAVENOUS | Status: DC

## 2021-11-20 MED ORDER — EPHEDRINE SULFATE (PRESSORS) 50 MG/ML IJ SOLN
INTRAMUSCULAR | Status: DC | PRN
  Administered 2021-11-20 (×2): 2.5 mg via INTRAVENOUS
  Administered 2021-11-20: 5 mg via INTRAVENOUS

## 2021-11-20 MED ORDER — CHLORHEXIDINE GLUCONATE CLOTH 2 % EX PADS
6.0000 | MEDICATED_PAD | Freq: Once | CUTANEOUS | Status: AC
  Administered 2021-11-20: 6 via TOPICAL

## 2021-11-20 MED ORDER — FENTANYL CITRATE (PF) 100 MCG/2ML IJ SOLN
INTRAMUSCULAR | Status: AC
  Filled 2021-11-20: qty 2

## 2021-11-20 MED ORDER — SODIUM CHLORIDE FLUSH 0.9 % IV SOLN
INTRAVENOUS | Status: AC
  Filled 2021-11-20: qty 10

## 2021-11-20 MED ORDER — OXYCODONE HCL 5 MG/5ML PO SOLN: 5.0000 mg | Freq: Once | ORAL | Status: AC | PRN

## 2021-11-20 MED ORDER — LIDOCAINE HCL (CARDIAC) PF 100 MG/5ML IV SOSY
PREFILLED_SYRINGE | INTRAVENOUS | Status: DC | PRN
  Administered 2021-11-20: 80 mg via INTRAVENOUS

## 2021-11-20 MED ORDER — IBUPROFEN 800 MG PO TABS: 800.0000 mg | ORAL_TABLET | Freq: Three times a day (TID) | ORAL | 1 refills | Status: DC | PRN

## 2021-11-20 MED ORDER — CEFAZOLIN SODIUM-DEXTROSE 2-4 GM/100ML-% IV SOLN
INTRAVENOUS | Status: AC
  Filled 2021-11-20: qty 100

## 2021-11-20 MED ORDER — CHLORHEXIDINE GLUCONATE 0.12 % MT SOLN
OROMUCOSAL | Status: AC
  Administered 2021-11-20: 15 mL via OROMUCOSAL
  Filled 2021-11-20: qty 15

## 2021-11-20 MED ORDER — ONDANSETRON HCL 4 MG/2ML IJ SOLN
INTRAMUSCULAR | Status: AC
  Filled 2021-11-20: qty 2

## 2021-11-20 MED ORDER — LIDOCAINE HCL (PF) 2 % IJ SOLN
INTRAMUSCULAR | Status: AC
  Filled 2021-11-20: qty 5

## 2021-11-20 MED ORDER — CEFAZOLIN SODIUM-DEXTROSE 2-4 GM/100ML-% IV SOLN
2.0000 g | INTRAVENOUS | Status: AC
  Administered 2021-11-20: 2 g via INTRAVENOUS

## 2021-11-20 MED ORDER — GABAPENTIN 300 MG PO CAPS: 300.0000 mg | ORAL_CAPSULE | ORAL | Status: AC

## 2021-11-20 MED ORDER — ACETAMINOPHEN 500 MG PO TABS
ORAL_TABLET | ORAL | Status: AC
  Administered 2021-11-20: 1000 mg via ORAL
  Filled 2021-11-20: qty 2

## 2021-11-20 MED ORDER — ORAL CARE MOUTH RINSE: 15.0000 mL | Freq: Once | OROMUCOSAL | Status: AC

## 2021-11-20 MED ORDER — OXYCODONE HCL 5 MG PO TABS: 5.0000 mg | ORAL_TABLET | ORAL | 0 refills | Status: DC | PRN

## 2021-11-20 MED ORDER — BUPIVACAINE LIPOSOME 1.3 % IJ SUSP: 20.0000 mL | Freq: Once | INTRAMUSCULAR | Status: DC

## 2021-11-20 MED ORDER — OXYCODONE HCL 5 MG PO TABS
5.0000 mg | ORAL_TABLET | Freq: Once | ORAL | Status: AC | PRN
  Administered 2021-11-20: 5 mg via ORAL

## 2021-11-20 MED ORDER — FAMOTIDINE 20 MG PO TABS: 20.0000 mg | ORAL_TABLET | Freq: Once | ORAL | Status: AC

## 2021-11-20 MED ORDER — MIDAZOLAM HCL 2 MG/2ML IJ SOLN
INTRAMUSCULAR | Status: AC
  Filled 2021-11-20: qty 2

## 2021-11-20 MED ORDER — PROPOFOL 10 MG/ML IV BOLUS
INTRAVENOUS | Status: DC | PRN
  Administered 2021-11-20: 20 mg via INTRAVENOUS
  Administered 2021-11-20: 130 mg via INTRAVENOUS

## 2021-11-20 MED ORDER — FAMOTIDINE 20 MG PO TABS
ORAL_TABLET | ORAL | Status: AC
  Administered 2021-11-20: 20 mg via ORAL
  Filled 2021-11-20: qty 1

## 2021-11-20 MED ORDER — OXYCODONE HCL 5 MG PO TABS
ORAL_TABLET | ORAL | Status: AC
  Filled 2021-11-20: qty 1

## 2021-11-20 MED ORDER — CHLORHEXIDINE GLUCONATE 0.12 % MT SOLN: 15.0000 mL | Freq: Once | OROMUCOSAL | Status: AC

## 2021-11-20 MED ORDER — PROPOFOL 10 MG/ML IV BOLUS
INTRAVENOUS | Status: AC
  Filled 2021-11-20: qty 20

## 2021-11-20 MED ORDER — GABAPENTIN 300 MG PO CAPS
ORAL_CAPSULE | ORAL | Status: AC
  Administered 2021-11-20: 300 mg via ORAL
  Filled 2021-11-20: qty 1

## 2021-11-20 MED ORDER — FENTANYL CITRATE (PF) 100 MCG/2ML IJ SOLN: 25.0000 ug | INTRAMUSCULAR | Status: DC | PRN

## 2021-11-20 MED ORDER — MIDAZOLAM HCL 2 MG/2ML IJ SOLN
INTRAMUSCULAR | Status: DC | PRN
  Administered 2021-11-20: 1 mg via INTRAVENOUS

## 2021-11-20 MED ORDER — ACETAMINOPHEN 500 MG PO TABS: 1000.0000 mg | ORAL_TABLET | ORAL | Status: AC

## 2021-11-20 MED ORDER — TECHNETIUM TC 99M TILMANOCEPT KIT
1.0000 | PACK | Freq: Once | INTRAVENOUS | Status: AC | PRN
  Administered 2021-11-20: 1 via INTRADERMAL

## 2021-11-20 MED ORDER — DEXAMETHASONE SODIUM PHOSPHATE 10 MG/ML IJ SOLN
INTRAMUSCULAR | Status: DC | PRN
  Administered 2021-11-20: 10 mg via INTRAVENOUS

## 2021-11-20 MED ORDER — ACETAMINOPHEN 500 MG PO TABS: 1000.0000 mg | ORAL_TABLET | Freq: Four times a day (QID) | ORAL | Status: AC | PRN

## 2021-11-20 MED ORDER — DEXAMETHASONE SODIUM PHOSPHATE 10 MG/ML IJ SOLN
INTRAMUSCULAR | Status: AC
  Filled 2021-11-20: qty 1

## 2021-11-20 MED ORDER — PROPOFOL 500 MG/50ML IV EMUL
INTRAVENOUS | Status: DC | PRN
  Administered 2021-11-20: 25 ug/kg/min via INTRAVENOUS

## 2021-11-20 MED ORDER — SODIUM CHLORIDE (PF) 0.9 % IJ SOLN: INTRAVENOUS | Status: DC | PRN

## 2021-11-20 MED ORDER — SODIUM CHLORIDE (PF) 0.9 % IJ SOLN
INTRAVENOUS | Status: DC | PRN
  Administered 2021-11-20: 5 mL

## 2021-11-20 MED ORDER — PHENYLEPHRINE HCL-NACL 20-0.9 MG/250ML-% IV SOLN
INTRAVENOUS | Status: DC | PRN
  Administered 2021-11-20: 25 ug/min via INTRAVENOUS

## 2021-11-20 MED ORDER — EPHEDRINE 5 MG/ML INJ
INTRAVENOUS | Status: AC
  Filled 2021-11-20: qty 5

## 2021-11-20 MED ORDER — METHYLENE BLUE 1 % INJ SOLN
INTRAVENOUS | Status: AC
  Filled 2021-11-20: qty 10

## 2021-11-20 MED ORDER — PHENYLEPHRINE 80 MCG/ML (10ML) SYRINGE FOR IV PUSH (FOR BLOOD PRESSURE SUPPORT)
PREFILLED_SYRINGE | INTRAVENOUS | Status: DC | PRN
  Administered 2021-11-20: 80 ug via INTRAVENOUS

## 2021-11-20 MED ORDER — BUPIVACAINE-EPINEPHRINE 0.5% -1:200000 IJ SOLN
INTRAMUSCULAR | Status: DC | PRN
  Administered 2021-11-20: 30 mL

## 2021-11-20 MED ORDER — FENTANYL CITRATE (PF) 100 MCG/2ML IJ SOLN
INTRAMUSCULAR | Status: DC | PRN
  Administered 2021-11-20 (×5): 25 ug via INTRAVENOUS

## 2021-11-20 MED ORDER — BUPIVACAINE-EPINEPHRINE (PF) 0.5% -1:200000 IJ SOLN
INTRAMUSCULAR | Status: AC
  Filled 2021-11-20: qty 30

## 2021-11-20 MED ORDER — STERILE WATER FOR IRRIGATION IR SOLN
Status: DC | PRN
  Administered 2021-11-20: 1000 mL

## 2021-11-20 MED ORDER — ONDANSETRON HCL 4 MG/2ML IJ SOLN
INTRAMUSCULAR | Status: DC | PRN
  Administered 2021-11-20: 4 mg via INTRAVENOUS

## 2021-11-20 SURGICAL SUPPLY — 59 items
ADH SKN CLS APL DERMABOND .7 (GAUZE/BANDAGES/DRESSINGS) ×1
APL PRP STRL LF DISP 70% ISPRP (MISCELLANEOUS) ×1
APPLIER CLIP 9.375 SM OPEN (CLIP)
APR CLP SM 9.3 20 MLT OPN (CLIP)
BINDER BREAST LRG (GAUZE/BANDAGES/DRESSINGS) IMPLANT
BINDER BREAST MEDIUM (GAUZE/BANDAGES/DRESSINGS) IMPLANT
BLADE PHOTON ILLUMINATED (MISCELLANEOUS) ×2 IMPLANT
BLADE SURG 15 STRL LF DISP TIS (BLADE) ×2 IMPLANT
BLADE SURG 15 STRL SS (BLADE) ×4
CHLORAPREP W/TINT 26 (MISCELLANEOUS) ×2 IMPLANT
CLIP APPLIE 9.375 SM OPEN (CLIP) IMPLANT
CNTNR SPEC 2.5X3XGRAD LEK (MISCELLANEOUS) ×1
CONT SPEC 4OZ STER OR WHT (MISCELLANEOUS) ×2
CONT SPEC 4OZ STRL OR WHT (MISCELLANEOUS) ×1
CONTAINER SPEC 2.5X3XGRAD LEK (MISCELLANEOUS) ×1 IMPLANT
COVER PROBE FLX POLY STRL (MISCELLANEOUS) ×2 IMPLANT
COVER PROBE GAMMA FINDER SLV (MISCELLANEOUS) IMPLANT
DERMABOND ADVANCED (GAUZE/BANDAGES/DRESSINGS) ×2
DERMABOND ADVANCED .7 DNX12 (GAUZE/BANDAGES/DRESSINGS) ×1 IMPLANT
DEVICE DUBIN SPECIMEN MAMMOGRA (MISCELLANEOUS) ×2 IMPLANT
DRAPE LAPAROTOMY 100X77 ABD (DRAPES) ×2 IMPLANT
DRSG GAUZE FLUFF 36X18 (GAUZE/BANDAGES/DRESSINGS) ×2 IMPLANT
ELECT CAUTERY BLADE TIP 2.5 (TIP) ×2
ELECT REM PT RETURN 9FT ADLT (ELECTROSURGICAL) ×2
ELECTRODE CAUTERY BLDE TIP 2.5 (TIP) ×1 IMPLANT
ELECTRODE REM PT RTRN 9FT ADLT (ELECTROSURGICAL) ×1 IMPLANT
GAUZE 4X4 16PLY ~~LOC~~+RFID DBL (SPONGE) ×2 IMPLANT
GLOVE SURG SYN 7.0 (GLOVE) ×2 IMPLANT
GLOVE SURG SYN 7.0 PF PI (GLOVE) ×1 IMPLANT
GLOVE SURG SYN 7.5  E (GLOVE) ×2
GLOVE SURG SYN 7.5 E (GLOVE) ×1 IMPLANT
GLOVE SURG SYN 7.5 PF PI (GLOVE) ×1 IMPLANT
GOWN STRL REUS W/ TWL LRG LVL3 (GOWN DISPOSABLE) ×2 IMPLANT
GOWN STRL REUS W/TWL LRG LVL3 (GOWN DISPOSABLE) ×4
KIT MARKER MARGIN INK (KITS) IMPLANT
KIT TURNOVER KIT A (KITS) ×2 IMPLANT
LABEL OR SOLS (LABEL) ×2 IMPLANT
MANIFOLD NEPTUNE II (INSTRUMENTS) ×2 IMPLANT
MARGIN MAP 10MM (MISCELLANEOUS) IMPLANT
MARKER MARGIN CORRECT CLIP (MARKER) IMPLANT
NDL HYPO 25X1 1.5 SAFETY (NEEDLE) ×1 IMPLANT
NEEDLE HYPO 22GX1.5 SAFETY (NEEDLE) ×2 IMPLANT
NEEDLE HYPO 25X1 1.5 SAFETY (NEEDLE) ×2 IMPLANT
PACK BASIN MINOR ARMC (MISCELLANEOUS) ×2 IMPLANT
SET LOCALIZER 20 PROBE US (MISCELLANEOUS) ×2 IMPLANT
SPONGE T-LAP 18X18 ~~LOC~~+RFID (SPONGE) ×1 IMPLANT
SUT MNCRL 4-0 (SUTURE) ×2
SUT MNCRL 4-0 27XMFL (SUTURE) ×1
SUT SILK 2-0 (SUTURE) ×1 IMPLANT
SUT SILK 3 0 SH 30 (SUTURE) ×2 IMPLANT
SUT VIC AB 3-0 SH 27 (SUTURE) ×2
SUT VIC AB 3-0 SH 27X BRD (SUTURE) ×1 IMPLANT
SUTURE MNCRL 4-0 27XMF (SUTURE) ×1 IMPLANT
SYR 10ML LL (SYRINGE) ×2 IMPLANT
SYR BULB IRRIG 60ML STRL (SYRINGE) ×2 IMPLANT
TAPE TRANSPORE STRL 2 31045 (GAUZE/BANDAGES/DRESSINGS) ×1 IMPLANT
TRAP NEPTUNE SPECIMEN COLLECT (MISCELLANEOUS) ×2 IMPLANT
WATER STERILE IRR 1000ML POUR (IV SOLUTION) ×2 IMPLANT
WATER STERILE IRR 500ML POUR (IV SOLUTION) ×2 IMPLANT

## 2021-11-20 NOTE — Interval H&P Note (Signed)
History and Physical Interval Note:  11/20/2021 10:37 AM  Megan Harrison  has presented today for surgery, with the diagnosis of Left breast cancer.  The various methods of treatment have been discussed with the patient and family. After consideration of risks, benefits and other options for treatment, the patient has consented to  Procedure(s): [place] (Left) as a surgical intervention.  The patient's history has been reviewed, patient examined, no change in status, stable for surgery.  I have reviewed the patient's chart and labs.  Questions were answered to the patient's satisfaction.     Rexann Lueras

## 2021-11-20 NOTE — Anesthesia Preprocedure Evaluation (Addendum)
Anesthesia Evaluation   Patient awake    Reviewed: Allergy & Precautions, NPO status , Patient's Chart, lab work & pertinent test results  History of Anesthesia Complications Negative for: history of anesthetic complications  Airway Mallampati: II  TM Distance: >3 FB Neck ROM: Full    Dental  (+) Teeth Intact, Dental Advisory Given   Pulmonary neg pulmonary ROS,    Pulmonary exam normal breath sounds clear to auscultation       Cardiovascular negative cardio ROS Normal cardiovascular exam Rhythm:Regular Rate:Normal     Neuro/Psych negative neurological ROS  negative psych ROS   GI/Hepatic negative GI ROS, Neg liver ROS, GERD  ,  Endo/Other  negative endocrine ROS  Renal/GU      Musculoskeletal   Abdominal   Peds  Hematology negative hematology ROS (+)   Anesthesia Other Findings Past Medical History: No date: Anemia No date: Cutaneous sarcoidosis No date: Degenerative disc disease No date: Diverticulosis 11/19/2017: Dysplastic nevus     Comment:  L mid back 6.0 cm lat to spine No date: Foot pain No date: GERD (gastroesophageal reflux disease) No date: Hyperlipidemia 11/04/2021: Malignant neoplasm of upper-outer quadrant of left breast  in female, estrogen receptor negative ([place]) No date: Migraine No date: Shingles     Comment:  eyes  Past Surgical History: 1999: 2D Echo     Comment:  Negative 03/1998: ABDOMINAL HYSTERECTOMY No date: APPENDECTOMY 12/2005: Arm lesion, cutaneous sarcoid     Comment:  with normal CXR and ANA 10/23/2021: BREAST BIOPSY; Left     Comment:  US biopsy/ heart clip/ [facility] 11/13/2021: BREAST LUMPECTOMY WITH RADIOFREQUENCY TAG IDENTIFICATION;  Left     Comment:  lt br rf tag placement No date: CESAREAN SECTION     Comment:  x2 11/0/2009,08/18/13: COLONOSCOPY     Comment:  diverticulosis and hemorrhoids No date: DIAGNOSTIC LAPAROSCOPY 10/13/2018: EYE SURGERY     Comment:   had shingles in left eye, had to have surgery, has               contact lens in now. No date: FOOT NEUROMA SURGERY; Left 02/18/2016: LAPAROSCOPIC APPENDECTOMY; N/A     Comment:  Procedure: APPENDECTOMY LAPAROSCOPIC;  Surgeon: Ralene Ok, MD;  Location: [facility];  Service: General;                Laterality: N/A; 10/2017: LUMBAR LAMINECTOMY No date: ROTATOR CUFF REPAIR; Left No date: TONSILLECTOMY 11/15/2010: UPPER GASTROINTESTINAL ENDOSCOPY     Comment:  non-erosive gastritis, H. pylori negative  BMI    Body Mass Index: 32.01 kg/m      Reproductive/Obstetrics negative OB ROS                            Anesthesia Physical  Anesthesia Plan  ASA: 2  Anesthesia Plan: General   Post-op Pain Management:    Induction: Intravenous, Rapid sequence and Cricoid pressure planned  PONV Risk Score and Plan: 3 and Ondansetron, Dexamethasone, Midazolam and Treatment may vary due to age or medical condition  Airway Management Planned: LMA  Additional Equipment:   Intra-op Plan:   Post-operative Plan: Extubation in OR  Informed Consent: I have reviewed the patients History and Physical, chart, labs and discussed the procedure including the risks, benefits and alternatives for the proposed anesthesia with the patient or authorized representative who has indicated his/her understanding and acceptance.  Dental advisory given  Plan Discussed with: CRNA, Anesthesiologist and Surgeon  Anesthesia Plan Comments: (Patient consented for risks of anesthesia including but not limited to:  - adverse reactions to medications - damage to eyes, teeth, lips or other oral mucosa - nerve damage due to positioning  - sore throat or hoarseness - Damage to heart, brain, nerves, lungs, other parts of body or loss of life  Patient voiced understanding.)       Anesthesia Quick Evaluation

## 2021-11-20 NOTE — Transfer of Care (Signed)
Immediate Anesthesia Transfer of Care Note  Patient: Megan Harrison  Procedure(s) Performed: BREAST LUMPECTOMY,RADIO FREQ LOCALIZER,AXILLARY SENTINEL LYMPH NODE BIOPSY (Left: Breast)  Patient Location: PACU  Anesthesia Type:General  Level of Consciousness: awake  Airway & Oxygen Therapy: Patient Spontanous Breathing and Patient connected to face mask  Post-op Assessment: Report given to RN and Post -op Vital signs reviewed and stable  Post vital signs: Reviewed and stable  Last Vitals:  Vitals Value Taken Time  BP 141/67 11/20/21 1346  Temp    Pulse 100 11/20/21 1347  Resp 15 11/20/21 1347  SpO2 99 % 11/20/21 1347  Vitals shown include unvalidated device data.  Last Pain:  Vitals:   11/20/21 0936  TempSrc: Oral         Complications: No notable events documented.

## 2021-11-20 NOTE — Anesthesia Procedure Notes (Signed)
Procedure Name: LMA Insertion Date/Time: 11/20/2021 11:05 AM  Performed by: Loletha Grayer, CRNAPre-anesthesia Checklist: Patient identified, Patient being monitored, Timeout performed, Emergency Drugs available and Suction available Patient Re-evaluated:Patient Re-evaluated prior to induction Oxygen Delivery Method: Circle system utilized Preoxygenation: Pre-oxygenation with 100% oxygen Induction Type: IV induction Ventilation: Mask ventilation without difficulty LMA: LMA inserted Laryngoscope Size: 4 Number of attempts: 1 Placement Confirmation: positive ETCO2 and breath sounds checked- equal and bilateral Tube secured with: Tape Dental Injury: Teeth and Oropharynx as per pre-operative assessment

## 2021-11-20 NOTE — Op Note (Signed)
Procedure Date:  11/20/2021  Pre-operative Diagnosis:  Left breast cancer  Post-operative Diagnosis: Left breast cancer  Procedure:  Left breast RF tag-localized lumpectomy and sentinel lymph node biopsy  Surgeon:  Melvyn Neth, MD  Anesthesia:  General endotracheal  Estimated Blood Loss:  20 ml  Specimens:   Sentinel lymph node #1 -- count 771 Lymph node #1 -- no count Left breast mass  Complications:  None  Operation performed with curative intent:Yes  Tracer(s) used to identify sentinel nodes in the upfront surgery (non-neoadjuvant) setting (select all that apply):Dye and Radioactive Tracer  All nodes (colored or non-colored) present at the end of a dye-filled lymphatic channel were removed:Yes   All significantly radioactive nodes were removed:Yes  All palpable suspicious nodes were removed:N/A  Biopsy-proven positive nodes marked with clips prior to chemotherapy were identified and removed:N/A  Indications for Procedure:  [name] who presents with new diagnosis of left breast cancer, triple negative.  The risks of bleeding, infection, injury to surrounding structures, hematoma, seroma, open wound, cosmetic deformity, and the need for further surgery were all discussed with the patient and was willing to proceed.  Prior to this procedure, the patient had undergone RF tag localization and sentinel lymphoscintigraphy.  Description of Procedure: The patient was correctly identified in the preoperative area and brought into the operating room.  The patient was placed supine with VTE prophylaxis in place.  Appropriate time-outs were performed.  Anesthesia was induced and the patient was intubated.  Appropriate antibiotics were infused.  A visual dye was injected in the left periareolar region under aseptic conditions. The left chest and axilla were prepped and draped in usual sterile fashion.  Then using the hand-held probe an area of high counts was  identified in the axilla, and a 5 cm incision was made.  Cautery was used to dissect down the subcutaneous tissue and the hand-held probe was used to guide dissection. A hot and blue lymph node was identified and resected.  This had a count of 771.  No other hot or blue lymph nodes were identified.  The wound bed had a count of 19.  As a precaution, I was able to palpate another lymph node in the axilla and this was excised and sent as lymph node #2.  This did not have a blue color nor a significant count, and did not feel abnormal on palpation.  The cavity was irrigated and hemostasis was assured with electrocautery.  Local anesthetic was infiltrated into the skin and subcutaneous tissue of the cavity.  The wound was then closed in two layers with 3-0 Vicryl and 4-0 Monocryl and sealed with DermaBond.  Attention was turned to the RF tag localization site in the left upper outer quadrant which was determined using the Hologic probe.  An incision was made overlying the tag and clip.  Hologic probe was used to guide our dissection using electrocautery, and a partial mastectomy was performed with adequate margins.  MarginMarker was used to ink each of the sides of the specimen. The specimen was then imaged to confirm that the area of concern, biopsy clip, and RF tag were included in the excision.  This was then sent to pathology.  The cavity was irrigated and hemostasis was assured with electrocautery.  Local anesthetic was infiltrated into the skin and subcutaneous tissue of the cavity.  The wound was then closed in multiple layers with 3-0 Vicryl and 4-0 Monocryl and sealed with DermaBond.  The patient  was emerged from anesthesia and extubated and brought to the recovery room for further management.  The patient tolerated the procedure well and all counts were correct at the end of the case.   Melvyn Neth, MD

## 2021-11-20 NOTE — Discharge Instructions (Signed)
AMBULATORY SURGERY  ?DISCHARGE INSTRUCTIONS ? ? ?The drugs that you were given will stay in your system until tomorrow so for the next 24 hours you should not: ? ?Drive an automobile ?Make any legal decisions ?Drink any alcoholic beverage ? ? ?You may resume regular meals tomorrow.  Today it is better to start with liquids and gradually work up to solid foods. ? ?You may eat anything you prefer, but it is better to start with liquids, then soup and crackers, and gradually work up to solid foods. ? ? ?Please notify your doctor immediately if you have any unusual bleeding, trouble breathing, redness and pain at the surgery site, drainage, fever, or pain not relieved by medication. ? ? ? ?Additional Instructions: ? ? ? ?Please contact your physician with any problems or Same Day Surgery at [phone], Monday through Friday 6 am to 4 pm, or [place] [facility] number at [phone].  ?

## 2021-11-21 ENCOUNTER — Encounter: Payer: Self-pay | Admitting: Surgery

## 2021-11-21 NOTE — Anesthesia Postprocedure Evaluation (Signed)
Anesthesia Post Note  Patient: Megan Harrison  Procedure(s) Performed: BREAST LUMPECTOMY,RADIO FREQ LOCALIZER,AXILLARY SENTINEL LYMPH NODE BIOPSY (Left: Breast)  Patient location during evaluation: PACU Anesthesia Type: General Level of consciousness: awake and alert Pain management: pain level controlled Vital Signs Assessment: post-procedure vital signs reviewed and stable Respiratory status: spontaneous breathing, nonlabored ventilation, respiratory function stable and patient connected to nasal cannula oxygen Cardiovascular status: blood pressure returned to baseline and stable Postop Assessment: no apparent nausea or vomiting Anesthetic complications: no   No notable events documented.   Last Vitals:  Vitals:   11/20/21 1440 11/20/21 1515  BP: (!) 148/78 (!) 157/89  Pulse: 91 87  Resp: 16 16  Temp: (!) 36.2 C (!) 35.9 C  SpO2: 98% 98%    Last Pain:  Vitals:   11/20/21 1515  TempSrc: Temporal  PainSc:                  Dimas Millin

## 2021-11-22 ENCOUNTER — Other Ambulatory Visit: Payer: Self-pay | Admitting: Anatomic Pathology & Clinical Pathology

## 2021-11-22 LAB — SURGICAL PATHOLOGY

## 2021-11-22 NOTE — Progress Notes (Signed)
11/22/21  Called patient to discuss pathology results.  Two lymph nodes excised were negative for cancer.  The breast mass contained invasive breast cancer and DCIS, both with clear margins.  Patient has appointment with Dr. Janese Banks next week and with me the week after.  Overall patient is recovering well and only reports some soreness that is controlled with Tylenol/Ibuprofen.  Megan Ree, MD

## 2021-11-23 ENCOUNTER — Ambulatory Visit: Payer: Medicare PPO | Admitting: Oncology

## 2021-11-26 ENCOUNTER — Encounter: Payer: Self-pay | Admitting: *Deleted

## 2021-11-27 ENCOUNTER — Inpatient Hospital Stay: Payer: Medicare PPO | Attending: Oncology | Admitting: Licensed Clinical Social Worker

## 2021-11-27 ENCOUNTER — Inpatient Hospital Stay: Payer: Medicare PPO

## 2021-11-27 DIAGNOSIS — Z171 Estrogen receptor negative status [ER-]: Secondary | ICD-10-CM | POA: Insufficient documentation

## 2021-11-27 DIAGNOSIS — C50412 Malignant neoplasm of upper-outer quadrant of left female breast: Secondary | ICD-10-CM | POA: Insufficient documentation

## 2021-11-27 DIAGNOSIS — Z800 Family history of malignant neoplasm of digestive organs: Secondary | ICD-10-CM | POA: Diagnosis not present

## 2021-11-27 DIAGNOSIS — Z801 Family history of malignant neoplasm of trachea, bronchus and lung: Secondary | ICD-10-CM

## 2021-11-27 NOTE — Progress Notes (Signed)
REFERRING PROVIDER: Sindy Guadeloupe, MD [place],  [place] [address]  PRIMARY PROVIDER:  Tower, Wynelle Fanny, MD  PRIMARY REASON FOR VISIT:  1. Malignant neoplasm of upper-outer quadrant of left breast in female, estrogen receptor negative ([place])   2. Family history of colon cancer   3. Family history of throat cancer   4. Family history of lung cancer      HISTORY OF PRESENT ILLNESS:   Ms. Megan Harrison, a 66 y.o. female, was seen for a [facility] cancer genetics consultation at the request of Dr. Janese Harrison due to a personal and family history of cancer.  Ms. Megan Harrison presents to clinic today to discuss the possibility of a hereditary predisposition to cancer, genetic testing, and to further clarify her future cancer risks, as well as potential cancer risks for family members.   In 2023, at the age of 12, Ms. Megan Harrison was diagnosed with invasive mammary carcinoma of the left breast, triple negative. The treatment plan includes lumpectomy (completed 7/18), remainder of treatment plan still being determined. Ms. Megan Harrison sees Dr. Janese Harrison this Friday 7/28.   CANCER HISTORY:  Oncology History  Malignant neoplasm of upper-outer quadrant of left breast in female, estrogen receptor negative ([place])  11/04/2021 Initial Diagnosis   Malignant neoplasm of upper-outer quadrant of left breast in female, estrogen receptor negative ([place])   11/04/2021 Cancer Staging   Staging form: Breast, AJCC 8th Edition - Clinical stage from 11/04/2021: Stage IB (cT1b, cN0, cM0, G3, ER-, PR-, HER2-) - Signed by Megan Guadeloupe, MD on 11/04/2021 Histologic grading system: 3 grade system     RISK FACTORS:  Menarche was at age 85-15.  First live birth at age 89.  OCP use for approximately 4 years.  Ovaries intact: no.  Hysterectomy: yes.  Menopausal status: postmenopausal.  HRT use: 5 - 8 years. Colonoscopy: yes;  polyps, reports less than 10 . Mammogram within the last year: yes. Number of breast biopsies: 1. Any excessive  radiation exposure in the past: no  Past Medical History:  Diagnosis Date   Anemia    Cutaneous sarcoidosis    Degenerative disc disease    Diverticulosis    Dysplastic nevus 11/19/2017   L mid back 6.0 cm lat to spine   Foot pain    GERD (gastroesophageal reflux disease)    Hyperlipidemia    Malignant neoplasm of upper-outer quadrant of left breast in female, estrogen receptor negative ([facility]) 11/04/2021   Migraine    Shingles    eyes    Past Surgical History:  Procedure Laterality Date   2D Echo  1999   Negative   ABDOMINAL HYSTERECTOMY  03/1998   APPENDECTOMY     Arm lesion, cutaneous sarcoid  12/2005   with normal CXR and ANA   BREAST BIOPSY Left 10/23/2021   US biopsy/ heart clip/ [facility]   BREAST LUMPECTOMY WITH RADIOFREQUENCY TAG IDENTIFICATION Left 32/95/1884   lt br rf tag placement   BREAST LUMPECTOMY,RADIO FREQ LOCALIZER,AXILLARY SENTINEL LYMPH NODE BIOPSY Left 11/20/2021   Procedure: BREAST LUMPECTOMY,RADIO FREQ LOCALIZER,AXILLARY SENTINEL LYMPH NODE BIOPSY;  Surgeon: Olean Ree, MD;  Location: [facility] ORS;  Service: General;  Laterality: Left;   CESAREAN SECTION     x2   COLONOSCOPY  11/0/2009,08/18/13   diverticulosis and hemorrhoids   DIAGNOSTIC LAPAROSCOPY     EYE SURGERY  10/13/2018   had shingles in left eye, had to have surgery, has contact lens in now.   FOOT NEUROMA SURGERY Left  LAPAROSCOPIC APPENDECTOMY N/A 02/18/2016   Procedure: APPENDECTOMY LAPAROSCOPIC;  Surgeon: Ralene Ok, MD;  Location: [facility];  Service: General;  Laterality: N/A;   LUMBAR LAMINECTOMY  10/2017   ROTATOR CUFF REPAIR Left    TONSILLECTOMY     UPPER GASTROINTESTINAL ENDOSCOPY  11/15/2010   non-erosive gastritis, H. pylori negative    FAMILY HISTORY:  We obtained a detailed, 4-generation family history.  Significant diagnoses are listed below: Family History  Problem Relation Age of Onset   Colon cancer Brother        early 45's   Stomach cancer Brother    Heart disease  Mother        CAD   Alcohol abuse Mother    Lung cancer Father    Heart disease Brother        CAD   Throat cancer Brother    Heart disease Brother        CAD, pre diabetic, obese   Colon polyps Sister    Breast cancer Neg Hx    Rectal cancer Neg Hx    Megan Harrison has 2 sons, 63 and 16. She has 1 sister and had 3 brothers, all brothers have passed. One brother had colon or stomach cancer and passed at 82. Another brother had throat cancer and passed at 87.   Megan Harrison mother passed at 38, no known cancers on her side of the family.  Megan Harrison father had lung cancer and passed at 49, history of smoking. Patient had 1 paternal uncle, he also passed of lung cancer in his 40s, history of smoking. No other known cancers on this side of the family.  Megan Harrison is unaware of previous family history of genetic testing for hereditary cancer risks. There is no reported Ashkenazi Jewish ancestry. There is no known consanguinity.    GENETIC COUNSELING ASSESSMENT: Megan Harrison is a 66 y.o. female with a personal history of triple negative breast cancer which is somewhat suggestive of a hereditary cancer syndrome and predisposition to cancer. We, therefore, discussed and recommended the following at today's visit.   DISCUSSION: We discussed that approximately 10% of cancer in general is hereditary, and approximately 15% of triple negative breast cancer is hereditary. Most cases of hereditary breast cancer are associated with BRCA1/BRCA2 genes, although there are other genes associated with hereditary cancer as well. Cancers and risks are gene specific. We discussed that testing is beneficial for several reasons including knowing about cancer risks, identifying potential screening and risk-reduction options that may be appropriate, and to understand if other family members could be at risk for cancer and allow them to undergo genetic testing.   We reviewed the characteristics, features and  inheritance patterns of hereditary cancer syndromes. We also discussed genetic testing, including the appropriate family members to test, the process of testing, insurance coverage and turn-around-time for results. We discussed the implications of a negative, positive and/or variant of uncertain significant result. We recommended Ms. Stiggers pursue genetic testing for the [employer] Common Hereditary Cancers+RNA gene panel.   Based on Ms. Galvao's personal history of cancer, she meets medical criteria for genetic testing. Despite that she meets criteria, she may still have an out of pocket cost. We discussed that if her out of pocket cost for testing is over $100, the laboratory will call and confirm whether she wants to proceed with testing.  If the out of pocket cost of testing is less than $100 she will be billed by the genetic testing laboratory.  PLAN: After considering the risks, benefits, and limitations, Ms. Mazzoni did not wish to pursue genetic testing at today's visit. We understand this decision and remain available to coordinate genetic testing at any time in the future. We, therefore, recommend Ms. Kinzie continue to follow the cancer screening guidelines given by her primary healthcare provider.  Ms. Liebig's questions were answered to her satisfaction today. Our contact information was provided should additional questions or concerns arise. Thank you for the referral and allowing [place] to share in the care of your patient.   Faith Rogue, MS, [facility] Genetic Counselor [email or web address] Phone: [phone]  The patient was seen for a total of 25 minutes in face-to-face genetic counseling.  Dr. Grayland Ormond was available for discussion regarding this case.   _______________________________________________________________________ For Office Staff:  Number of people involved in session: 1 Was an Intern/ student involved with case: no

## 2021-11-30 ENCOUNTER — Inpatient Hospital Stay: Payer: Medicare PPO | Admitting: Oncology

## 2021-11-30 ENCOUNTER — Encounter: Payer: Self-pay | Admitting: Oncology

## 2021-11-30 ENCOUNTER — Encounter: Payer: Self-pay | Admitting: *Deleted

## 2021-11-30 VITALS — BP 135/80 | HR 67 | Temp 97.4°F | Resp 18 | Wt 182.7 lb

## 2021-11-30 DIAGNOSIS — Z7189 Other specified counseling: Secondary | ICD-10-CM | POA: Diagnosis not present

## 2021-11-30 DIAGNOSIS — Z171 Estrogen receptor negative status [ER-]: Secondary | ICD-10-CM | POA: Diagnosis not present

## 2021-11-30 DIAGNOSIS — C50412 Malignant neoplasm of upper-outer quadrant of left female breast: Secondary | ICD-10-CM

## 2021-11-30 MED ORDER — ONDANSETRON HCL 8 MG PO TABS: 8.0000 mg | ORAL_TABLET | Freq: Two times a day (BID) | ORAL | 1 refills | Status: DC | PRN

## 2021-11-30 MED ORDER — DEXAMETHASONE 4 MG PO TABS: 8.0000 mg | ORAL_TABLET | Freq: Two times a day (BID) | ORAL | 1 refills | Status: DC

## 2021-11-30 MED ORDER — LIDOCAINE-PRILOCAINE 2.5-2.5 % EX CREA: TOPICAL_CREAM | CUTANEOUS | 3 refills | Status: DC

## 2021-11-30 MED ORDER — LORAZEPAM 0.5 MG PO TABS: 0.5000 mg | ORAL_TABLET | Freq: Four times a day (QID) | ORAL | 0 refills | Status: DC | PRN

## 2021-11-30 MED ORDER — PROCHLORPERAZINE MALEATE 10 MG PO TABS: 10.0000 mg | ORAL_TABLET | Freq: Four times a day (QID) | ORAL | 1 refills | Status: DC | PRN

## 2021-11-30 NOTE — Progress Notes (Signed)
Hematology/Oncology Consult note [facility]  [phone] [phone]  Patient Care Team: Tower, Wynelle Fanny, MD as PCP - General Daiva Huge, RN as Oncology Nurse Navigator   Name of the patient: Megan Harrison  [id number]  Jul 15, 1955   Date of visit: 11/30/21  Diagnosis-pathological prognostic stage Ia invasive mammary carcinoma of the left breast pT1b N0 M0 ER/PR HER2 negative  Chief complaint/ Reason for visit-discuss final pathology results and further management  Heme/Onc history: Patient is a 66 year old female who underwent a screening bilateral mammogram in May 2023 which showed a possible asymmetry in the left breast.  This was followed by diagnostic mammogram and ultrasound which showed a hypoechoic mass in the left breast 3 x 4 x 6 mm.  No abnormality noted in the left axillary lymph nodes.  This was biopsied and was consistent with invasive mammary carcinoma 4 mm grade 3 ER negative less than 1%, PR negative less than 1% and HER2 negative.  Final pathology showed 8 mm grade 3 triple negative tumor with negative margins.3 sentinel lymph nodes negative for malignancy.  Interval history-she is recovering well from her lumpectomy and denies any specific complaints at this time  ECOG PS- 0 Pain scale- 0   Review of systems- Review of Systems  Constitutional:  Negative for chills, fever, malaise/fatigue and weight loss.  HENT:  Negative for congestion, ear discharge and nosebleeds.   Eyes:  Negative for blurred vision.  Respiratory:  Negative for cough, hemoptysis, sputum production, shortness of breath and wheezing.   Cardiovascular:  Negative for chest pain, palpitations, orthopnea and claudication.  Gastrointestinal:  Negative for abdominal pain, blood in stool, constipation, diarrhea, heartburn, melena, nausea and vomiting.  Genitourinary:  Negative for dysuria, flank pain, frequency, hematuria and urgency.  Musculoskeletal:   Negative for back pain, joint pain and myalgias.  Skin:  Negative for rash.  Neurological:  Negative for dizziness, tingling, focal weakness, seizures, weakness and headaches.  Endo/Heme/Allergies:  Does not bruise/bleed easily.  Psychiatric/Behavioral:  Negative for depression and suicidal ideas. The patient does not have insomnia.       Allergies  Allergen Reactions   Codeine     REACTION: syncope   Sumatriptan     REACTION: choking sensation     Past Medical History:  Diagnosis Date   Anemia    Cutaneous sarcoidosis    Degenerative disc disease    Diverticulosis    Dysplastic nevus 11/19/2017   L mid back 6.0 cm lat to spine   Foot pain    GERD (gastroesophageal reflux disease)    Hyperlipidemia    Malignant neoplasm of upper-outer quadrant of left breast in female, estrogen receptor negative ([place]) 11/04/2021   Migraine    Shingles    eyes     Past Surgical History:  Procedure Laterality Date   2D Echo  1999   Negative   ABDOMINAL HYSTERECTOMY  03/1998   APPENDECTOMY     Arm lesion, cutaneous sarcoid  12/2005   with normal CXR and ANA   BREAST BIOPSY Left 10/23/2021   US biopsy/ heart clip/ [facility]   BREAST LUMPECTOMY WITH RADIOFREQUENCY TAG IDENTIFICATION Left 62/13/0865   lt br rf tag placement   BREAST LUMPECTOMY,RADIO FREQ LOCALIZER,AXILLARY SENTINEL LYMPH NODE BIOPSY Left 11/20/2021   Procedure: BREAST LUMPECTOMY,RADIO FREQ LOCALIZER,AXILLARY SENTINEL LYMPH NODE BIOPSY;  Surgeon: Olean Ree, MD;  Location: [facility] ORS;  Service: General;  Laterality: Left;   CESAREAN SECTION     x2  COLONOSCOPY  11/0/2009,08/18/13   diverticulosis and hemorrhoids   DIAGNOSTIC LAPAROSCOPY     EYE SURGERY  10/13/2018   had shingles in left eye, had to have surgery, has contact lens in now.   FOOT NEUROMA SURGERY Left    LAPAROSCOPIC APPENDECTOMY N/A 02/18/2016   Procedure: APPENDECTOMY LAPAROSCOPIC;  Surgeon: Ralene Ok, MD;  Location: [address];  Service: General;   Laterality: N/A;   LUMBAR LAMINECTOMY  10/2017   ROTATOR CUFF REPAIR Left    TONSILLECTOMY     UPPER GASTROINTESTINAL ENDOSCOPY  11/15/2010   non-erosive gastritis, H. pylori negative    Social History   Socioeconomic History   Marital status: Married    Spouse name: Not on file   Number of children: Not on file   Years of education: Not on file   Highest education level: Not on file  Occupational History   Occupation: Works for [employer]    Employer: RETIRED  Tobacco Use   Smoking status: Never   Smokeless tobacco: Never  Vaping Use   Vaping Use: Never used  Substance and Sexual Activity   Alcohol use: No    Alcohol/week: 0.0 standard drinks of alcohol   Drug use: No   Sexual activity: Yes  Other Topics Concern   Not on file  Social History Narrative   Regular exercise:  Curves, elliptical, walking.   Social Determinants of Health   Financial Resource Strain: Low Risk  (02/01/2020)   Overall Financial Resource Strain (CARDIA)    Difficulty of Paying Living Expenses: Not hard at all  Food Insecurity: No Food Insecurity (02/01/2020)   Hunger Vital Sign    Worried About Running Out of Food in the Last Year: Never true    Ran Out of Food in the Last Year: Never true  Transportation Needs: No Transportation Needs (02/01/2020)   PRAPARE - Hydrologist (Medical): No    Lack of Transportation (Non-Medical): No  Physical Activity: Insufficiently Active (02/01/2020)   Exercise Vital Sign    Days of Exercise per Week: 4 days    Minutes of Exercise per Session: 30 min  Stress: No Stress Concern Present (02/01/2020)   [place]    Feeling of Stress : Not at all  Social Connections: Not on file  Intimate Partner Violence: Not At Risk (02/01/2020)   Humiliation, Afraid, Rape, and Kick questionnaire    Fear of Current or Ex-Partner: No    Emotionally Abused: No    Physically  Abused: No    Sexually Abused: No    Family History  Problem Relation Age of Onset   Colon cancer Brother        early 63's   Stomach cancer Brother    Heart disease Mother        CAD   Alcohol abuse Mother    Lung cancer Father    Heart disease Brother        CAD   Throat cancer Brother    Heart disease Brother        CAD, pre diabetic, obese   Colon polyps Sister    Breast cancer Neg Hx    Rectal cancer Neg Hx      Current Outpatient Medications:    acetaminophen (TYLENOL) 500 MG tablet, Take 2 tablets (1,000 mg total) by mouth every 6 (six) hours as needed for mild pain., Disp: , Rfl:    aspirin 81 MG tablet, Take  81 mg by mouth daily. , Disp: , Rfl:    atorvastatin (LIPITOR) 10 MG tablet, Take 1 tablet (10 mg total) by mouth daily. (Patient taking differently: Take 10 mg by mouth at bedtime.), Disp: 90 tablet, Rfl: 3   calcium carbonate (TUMS - DOSED IN MG ELEMENTAL CALCIUM) 500 MG chewable tablet, Chew 1 tablet by mouth as needed for indigestion or heartburn., Disp: , Rfl:    Cholecalciferol (VITAMIN D3) 50 MCG (2000 UT) TABS, Take 1 tablet by mouth daily., Disp: , Rfl:    ferrous sulfate 325 (65 FE) MG tablet, Take 325 mg by mouth daily., Disp: , Rfl:    fluticasone (FLONASE) 50 MCG/ACT nasal spray, USE ONE SPRAY(S) IN EACH NOSTRIL ONCE DAILY AS DIRECTED (Patient taking differently: 1 spray at bedtime. USE ONE SPRAY(S) IN EACH NOSTRIL ONCE DAILY AS DIRECTED), Disp: 48 g, Rfl: 3   gabapentin (NEURONTIN) 300 MG capsule, Take 300 mg by mouth daily after supper., Disp: , Rfl:    ibuprofen (ADVIL) 800 MG tablet, Take 1 tablet (800 mg total) by mouth every 8 (eight) hours as needed for moderate pain., Disp: 60 tablet, Rfl: 1   Magnesium Hydroxide (DULCOLAX PO), Take 1 tablet by mouth 2 (two) times daily., Disp: , Rfl:    meloxicam (MOBIC) 15 MG tablet, Take 1 tablet (15 mg total) by mouth daily as needed for pain. (Patient taking differently: Take 15 mg by mouth daily.), Disp: 90  tablet, Rfl: 3   Multiple Vitamins-Minerals (ONE-A-DAY WOMENS 50 PLUS PO), Take 1 tablet by mouth daily., Disp: , Rfl:    prednisoLONE acetate (PRED FORTE) 1 % ophthalmic suspension, Place 1 drop into the left eye every other day., Disp: , Rfl:    Timolol Maleate 0.5 % (DAILY) SOLN, Place 1 drop into both eyes daily at 6 (six) AM., Disp: , Rfl:    dexamethasone (DECADRON) 4 MG tablet, Take 2 tablets (8 mg total) by mouth 2 (two) times daily. Start the day before Taxotere. Then again the day after chemo for 3 days., Disp: 30 tablet, Rfl: 1   lidocaine-prilocaine (EMLA) cream, Apply to affected area once, Disp: 30 g, Rfl: 3   LORazepam (ATIVAN) 0.5 MG tablet, Take 1 tablet (0.5 mg total) by mouth every 6 (six) hours as needed (Nausea or vomiting)., Disp: 30 tablet, Rfl: 0   ondansetron (ZOFRAN) 8 MG tablet, Take 1 tablet (8 mg total) by mouth 2 (two) times daily as needed for refractory nausea / vomiting. Start on day 3 after chemo., Disp: 30 tablet, Rfl: 1   prochlorperazine (COMPAZINE) 10 MG tablet, Take 1 tablet (10 mg total) by mouth every 6 (six) hours as needed (Nausea or vomiting)., Disp: 30 tablet, Rfl: 1  Physical exam:  Vitals:   11/30/21 0955  BP: 135/80  Pulse: 67  Resp: 18  Temp: (!) 97.4 F (36.3 C)  SpO2: 99%  Weight: 182 lb 11.2 oz (82.9 kg)   Physical Exam Constitutional:      General: She is not in acute distress. Cardiovascular:     Rate and Rhythm: Normal rate and regular rhythm.     Heart sounds: Normal heart sounds.  Pulmonary:     Effort: Pulmonary effort is normal.     Breath sounds: Normal breath sounds.  Abdominal:     General: Bowel sounds are normal.     Palpations: Abdomen is soft.  Skin:    General: Skin is warm and dry.  Neurological:     Mental Status: She  is alert and oriented to person, place, and time.         Latest Ref Rng & Units 11/16/2021    2:52 PM  CMP  Glucose 70 - 99 mg/dL 115   BUN 8 - 23 mg/dL 20   Creatinine 0.44 - 1.00 mg/dL  0.77   Sodium 135 - 145 mmol/L 141   Potassium 3.5 - 5.1 mmol/L 3.9   Chloride 98 - 111 mmol/L 106   CO2 22 - 32 mmol/L 28   Calcium 8.9 - 10.3 mg/dL 9.5       Latest Ref Rng & Units 11/16/2021    2:52 PM  CBC  WBC 4.0 - 10.5 K/uL 8.9   Hemoglobin 12.0 - 15.0 g/dL 12.2   Hematocrit 36.0 - 46.0 % 37.3   Platelets 150 - 400 K/uL 317       MM Breast Surgical Specimen  Result Date: 11/20/2021 CLINICAL DATA:  Status post RF ID tag localized LEFT breast lumpectomy. Heart clip at site invasive mammary carcinoma. EXAM: SPECIMEN RADIOGRAPH OF THE LEFT BREAST COMPARISON:  Previous exam(s). FINDINGS: Status post excision of the LEFT breast. The RF ID tag and heart shaped clip are present within the specimen. IMPRESSION: Specimen radiograph of the LEFT breast. Electronically Signed   By: Valentino Saxon M.D.   On: 11/20/2021 13:48  NM Sentinel Node Inj-No Rpt (Breast)  Result Date: 11/20/2021 Sulfur Colloid was injected by the Nuclear Medicine Technologist for sentinel lymph node localization.   MM LT RADIO FREQUENCY TAG LOC MAMMO GUIDE  Result Date: 11/13/2021 CLINICAL DATA:  66 year old female presenting for localization of the left breast prior to lumpectomy. Patient has a recent diagnosis of left breast invasive mammary carcinoma EXAM: MAMMOGRAPHIC GUIDED RADIOFREQUENCY DEVICE LOCALIZATION OF THE LEFT BREAST COMPARISON:  Previous exam(s). FINDINGS: Patient presents for radiofrequency device localization prior to left breast lumpectomy. I met with the patient and we discussed the procedure of radiofrequency device localization including benefits and alternatives. We discussed the high likelihood of a successful procedure. We discussed the risks of the procedure including infection, bleeding, tissue injury and further surgery. Informed, written consent was given. The usual time-out protocol was performed immediately prior to the procedure. Using mammographic guidance, sterile technique, 1%  lidocaine as local anesthesia, a radiofrequency tag was used to localize the heart shaped clip in the upper outer left breast using a lateral approach. The follow-up mammogram images confirm that the RF device is in the expected location and are marked for Dr. Hampton Abbot Follow-up survey of the patient confirms the presence of the RF device The patient tolerated the procedure well and was released from the [place]. IMPRESSION: Radiofrequency device localization of the LEFT breast. No apparent complications. Electronically Signed   By: Audie Pinto M.D.   On: 11/13/2021 16:27    Assessment and plan- Patient is a 66 y.o. female with stage I triple negative left breast cancer pT1b N0 M0 here to discuss further management  Discussed results of final pathology which showed an 8 mm grade 3 triple negative breast cancer with negative margins.  3 sentinel lymph nodes negative for malignancy.  Given that she has triple negative breast cancer that is more than 5 mm in size I would recommend adjuvant chemotherapy for her.  The tumor less than 1 cm in size I would recommend unknown anthracycline regimen for her.  I would recommend Taxotere along with Cytoxan given IV every 3 weeks for 4 cycles.  Discussed risks and benefits of  chemotherapy including all but not limited to nausea, vomiting, low blood counts, risk of infections and hospitalizations.  Risk of hearing loss as well as peripheral neuropathy associated with Taxotere.  Treatment will be given with a curative intent.  Patient understands and agrees to proceed as planned.  Did discuss role of cryotherapy to prevent chemo-induced peripheral neuropathy in her hands and feet.  We do not have digni-cap to prevent chemotherapy-induced hair loss but I did discuss scalp cooling techniques if she would be interested.  I will reach out to Dr. Hampton Abbot to get a port placed for chemotherapy sometime next week.  We will also get chemo teach in place and tentatively  plan to start cycle 1 of TC chemotherapy on 12/12/2021.  She will be seen by covering MD or NP on that day and I will see her subsequently for cycle 2 of chemotherapy.  We will also be doing Udenyca on day 3 for growth factor support.  Following completion of adjuvant chemotherapy patient will go on to receive adjuvant radiation therapy and I will refer her to radiation oncology down the line  There would be no role for endocrine therapy and triple negative disease.  Patient verbalized understanding of the plan   Cancer Staging  Malignant neoplasm of upper-outer quadrant of left breast in female, estrogen receptor negative ([place]) Staging form: Breast, AJCC 8th Edition - Clinical stage from 11/04/2021: Stage IB (cT1b, cN0, cM0, G3, ER-, PR-, HER2-) - Signed by Sindy Guadeloupe, MD on 11/04/2021 Histologic grading system: 3 grade system - Pathologic stage from 11/30/2021: Stage IB (pT1b, pN0(sn), cM0, G3, ER-, PR-, HER2-) - Signed by Sindy Guadeloupe, MD on 11/30/2021 Method of lymph node assessment: Sentinel lymph node biopsy Multigene prognostic tests performed: None Histologic grading system: 3 grade system     Visit Diagnosis 1. Malignant neoplasm of upper-outer quadrant of left breast in female, estrogen receptor negative ([place])   2. Goals of care, counseling/discussion      Dr. Randa Evens, MD, MPH [facility] at [facility] [phone] 11/30/2021 3:47 PM

## 2021-11-30 NOTE — Progress Notes (Signed)
Accompanied patient and husband to medical oncology appt. To review pathology results and upcoming plan.

## 2021-11-30 NOTE — Progress Notes (Signed)
START ON PATHWAY REGIMEN - Breast     A cycle is every 21 days:     Docetaxel      Cyclophosphamide   **Always confirm dose/schedule in your pharmacy ordering system**  Patient Characteristics: Postoperative without Neoadjuvant Therapy (Pathologic Staging), Invasive Disease, Adjuvant Therapy, HER2 Negative/Unknown/Equivocal, ER Negative/Unknown, Node Negative, pT1a-b, N0, Chemotherapy Indicated Therapeutic Status: Postoperative without Neoadjuvant Therapy (Pathologic Staging) AJCC Grade: G3 AJCC N Category: pN0 AJCC M Category: cM0 ER Status: Negative (-) AJCC 8 Stage Grouping: IB HER2 Status: Negative (-) Oncotype Dx Recurrence Score: Not Appropriate AJCC T Category: pT1b PR Status: Negative (-) Adjuvant Therapy Status: No Adjuvant Therapy Received Yet or Changing Initial Adjuvant Regimen due to Tolerance Intervention Indicated: Chemotherapy Intent of Therapy: Curative Intent, Discussed with Patient 

## 2021-12-01 ENCOUNTER — Other Ambulatory Visit: Payer: Self-pay

## 2021-12-03 ENCOUNTER — Ambulatory Visit (INDEPENDENT_AMBULATORY_CARE_PROVIDER_SITE_OTHER): Payer: Medicare PPO | Admitting: Surgery

## 2021-12-03 ENCOUNTER — Telehealth: Payer: Self-pay | Admitting: Surgery

## 2021-12-03 ENCOUNTER — Encounter: Payer: Self-pay | Admitting: Surgery

## 2021-12-03 VITALS — BP 140/86 | HR 69 | Temp 98.5°F | Wt 182.4 lb

## 2021-12-03 DIAGNOSIS — C50412 Malignant neoplasm of upper-outer quadrant of left female breast: Secondary | ICD-10-CM

## 2021-12-03 DIAGNOSIS — Z171 Estrogen receptor negative status [ER-]: Secondary | ICD-10-CM

## 2021-12-03 NOTE — H&P (View-Only) (Signed)
12/03/2021  History of Present Illness: Megan Harrison is a 66 y.o. female with a history of left breast cancer, s/p left breast lumpectomy and sentinel lymph node biopsy on 11/20/21.  Pathology showed an 8 mm grade 3 ER/PR/Her2 negative cancer.  Lymph nodes were negative.  However, given the size > 5 mm and triple negative, the final recommendation is for chemotherapy.  She reports that she's doing well after her surgery and denies any worsening pain.  She did have some mild pain yesterday in the medial portion of left breast, but that resolved on its own.  She has met with Dr. Janese Banks and discussed starting chemotherapy on 12/12/21.  She now needs a port-a-cath.  Past Medical History: Past Medical History:  Diagnosis Date   Anemia    Cutaneous sarcoidosis    Degenerative disc disease    Diverticulosis    Dysplastic nevus 11/19/2017   L mid back 6.0 cm lat to spine   Foot pain    GERD (gastroesophageal reflux disease)    Hyperlipidemia    Malignant neoplasm of upper-outer quadrant of left breast in female, estrogen receptor negative ([place]) 11/04/2021   Migraine    Shingles    eyes     Past Surgical History: Past Surgical History:  Procedure Laterality Date   2D Echo  1999   Negative   ABDOMINAL HYSTERECTOMY  03/1998   APPENDECTOMY     Arm lesion, cutaneous sarcoid  12/2005   with normal CXR and ANA   BREAST BIOPSY Left 10/23/2021   [facility] clip/ [facility]   BREAST LUMPECTOMY WITH RADIOFREQUENCY TAG IDENTIFICATION Left 70/62/3762   lt br rf tag placement   BREAST LUMPECTOMY,RADIO FREQ [place] BIOPSY Left 11/20/2021   Procedure: BREAST LUMPECTOMY,RADIO FREQ LOCALIZER,AXILLARY SENTINEL LYMPH NODE BIOPSY;  Surgeon: Olean Ree, MD;  Location: [facility];  Service: General;  Laterality: Left;   CESAREAN SECTION     x2   COLONOSCOPY  11/0/2009,08/18/13   diverticulosis and hemorrhoids   DIAGNOSTIC LAPAROSCOPY     EYE SURGERY  10/13/2018   had shingles  in left eye, had to have surgery, has contact lens in now.   FOOT NEUROMA SURGERY Left    LAPAROSCOPIC APPENDECTOMY N/A 02/18/2016   Procedure: APPENDECTOMY LAPAROSCOPIC;  Surgeon: Ralene Ok, MD;  Location: [place];  Service: General;  Laterality: N/A;   LUMBAR LAMINECTOMY  10/2017   ROTATOR CUFF REPAIR Left    TONSILLECTOMY     UPPER GASTROINTESTINAL ENDOSCOPY  11/15/2010   non-erosive gastritis, H. pylori negative    Home Medications: Prior to Admission medications   Medication Sig Start Date End Date Taking? Authorizing Provider  acetaminophen (TYLENOL) 500 MG tablet Take 2 tablets (1,000 mg total) by mouth every 6 (six) hours as needed for mild pain. 11/20/21  Yes Latima Hamza, Jacqulyn Bath, MD  aspirin 81 MG tablet Take 81 mg by mouth daily.    Yes [provider]  atorvastatin (LIPITOR) 10 MG tablet Take 1 tablet (10 mg total) by mouth daily. Patient taking differently: Take 10 mg by mouth at bedtime. 02/15/21  Yes Tower, Wynelle Fanny, MD  calcium carbonate (TUMS - DOSED IN MG ELEMENTAL CALCIUM) 500 MG chewable tablet Chew 1 tablet by mouth as needed for indigestion or heartburn.   Yes [provider]  Cholecalciferol (VITAMIN D3) 50 MCG (2000 UT) TABS Take 1 tablet by mouth daily.   Yes [provider]  dexamethasone (DECADRON) 4 MG tablet Take 2 tablets (8 mg  total) by mouth 2 (two) times daily. Start the day before Taxotere. Then again the day after chemo for 3 days. 11/30/21  Yes Rao, Archana C, MD  ferrous sulfate 325 (65 FE) MG tablet Take 325 mg by mouth daily.   Yes [provider]  fluticasone (FLONASE) 50 MCG/ACT nasal spray USE ONE SPRAY(S) IN EACH NOSTRIL ONCE DAILY AS DIRECTED Patient taking differently: 1 spray at bedtime. USE ONE SPRAY(S) IN EACH NOSTRIL ONCE DAILY AS DIRECTED 02/15/21  Yes Tower, Marne A, MD  gabapentin (NEURONTIN) 300 MG capsule Take 300 mg by mouth daily after supper.   Yes [provider]  ibuprofen (ADVIL) 800 MG tablet  Take 1 tablet (800 mg total) by mouth every 8 (eight) hours as needed for moderate pain. 11/20/21  Yes Amayrany Cafaro, MD  lidocaine-prilocaine (EMLA) cream Apply to affected area once 11/30/21  Yes Rao, Archana C, MD  LORazepam (ATIVAN) 0.5 MG tablet Take 1 tablet (0.5 mg total) by mouth every 6 (six) hours as needed (Nausea or vomiting). 11/30/21  Yes Rao, Archana C, MD  Magnesium Hydroxide (DULCOLAX PO) Take 1 tablet by mouth 2 (two) times daily.   Yes [provider]  meloxicam (MOBIC) 15 MG tablet Take 1 tablet (15 mg total) by mouth daily as needed for pain. Patient taking differently: Take 15 mg by mouth daily. 02/15/21  Yes Tower, Marne A, MD  Multiple Vitamins-Minerals (ONE-A-DAY WOMENS 50 PLUS PO) Take 1 tablet by mouth daily.   Yes [provider]  ondansetron (ZOFRAN) 8 MG tablet Take 1 tablet (8 mg total) by mouth 2 (two) times daily as needed for refractory nausea / vomiting. Start on day 3 after chemo. 11/30/21  Yes Rao, Archana C, MD  prednisoLONE acetate (PRED FORTE) 1 % ophthalmic suspension Place 1 drop into the left eye every other day.   Yes [provider]  prochlorperazine (COMPAZINE) 10 MG tablet Take 1 tablet (10 mg total) by mouth every 6 (six) hours as needed (Nausea or vomiting). 11/30/21  Yes Rao, Archana C, MD  Timolol Maleate 0.5 % (DAILY) SOLN Place 1 drop into both eyes daily at 6 (six) AM. 08/16/16  Yes [provider]    Allergies: Allergies  Allergen Reactions   Codeine     REACTION: syncope   Sumatriptan     REACTION: choking sensation    Review of Systems: Review of Systems  Constitutional:  Negative for chills and fever.  HENT:  Negative for hearing loss.   Respiratory:  Negative for shortness of breath.   Cardiovascular:  Negative for chest pain.  Gastrointestinal:  Negative for abdominal pain, nausea and vomiting.  Genitourinary:  Negative for dysuria.  Musculoskeletal:  Negative for myalgias.  Skin:  Negative for  rash.       Ecchymosis at incision sites.  Neurological:  Negative for dizziness.  Psychiatric/Behavioral:  Negative for depression.     Physical Exam BP 140/86   Pulse 69   Temp 98.5 F (36.9 C) (Oral)   Wt 182 lb 6.4 oz (82.7 kg)   SpO2 98%   BMI 31.80 kg/m  CONSTITUTIONAL: No acute distress, well nourished. HEENT:  Normocephalic, atraumatic, extraocular motion intact. NECK: Trachea is midline, no jugular venous distention RESPIRATORY:  Normal respiratory effort without pathologic use of accessory muscles. CARDIOVASCULAR: Regular rhythm and rate. BREAST:  Left breast s/p lumpectomy.  Scar healing well with some surrounding ecchymosis.  There is still residual methylene blue and skin reaction in the lateral periareolar   area.  No skin necrosis and no evidence of seroma.  Left axilla s/p sentinel node biopsy, with incision also healing well and also with some mild ecchymosis.  No significant tenderness on either site. MUSCULOSKELETAL:  Normal gait, no peripheral edema. NEUROLOGIC:  Motor and sensation is grossly normal.  Cranial nerves are grossly intact. PSYCH:  Alert and oriented to person, place and time. Affect is normal.   Assessment and Plan: [name] with left breast cancer ER/PR/Her2 negative.  --Patient requires chemotherapy given the final pathology results.  Discussed with her the reason for port-a-cath placement as we need access to a more central vein for infusion.  Showed her a sample port-a-cath and how the catheter would go into the vein and the port would be subcutaneous over the upper right chest.  Discussed the possibilities of either using subclavian vein or jugular vein depending on accessibility.   --Reviewed the surgery at length with her including  the incisions, risks the bleeding, infection, injury to surrounding structures, post-op recovery, eventual plan for excision of the catheter, and she's willing to proceeed. --Will schedule the patient  for 12/05/21.  All of her questions have been answered.  I spent 40 minutes dedicated to the care of this patient on the date of this encounter to include pre-visit review of records, face-to-face time with the patient discussing diagnosis and management, and any post-visit coordination of care.   Melvyn Neth, [facility]

## 2021-12-03 NOTE — Patient Instructions (Signed)
If you have any concerns or questions, please feel free to call our office.   Implanted Port Home Guide An implanted port is a device that is placed under the skin. It is usually placed in the chest. The device may vary based on the need. Implanted ports can be used to give IV medicine, to take blood, or to give fluids. You may have an implanted port if: You need IV medicine that would be irritating to the small veins in your hands or arms. You need IV medicines, such as chemotherapy, for a long period of time. You need IV nutrition for a long period of time. You may have fewer limitations when using a port than you would if you used other types of long-term IVs. You will also likely be able to return to normal activities after your incision heals. An implanted port has two main parts: Reservoir. The reservoir is the part where a needle is inserted to give medicines or draw blood. The reservoir is round. After the port is placed, it appears as a small, raised area under your skin. Catheter. The catheter is a small, thin tube that connects the reservoir to a vein. Medicine that is inserted into the reservoir goes into the catheter and then into the vein. How is my port accessed? To access your port: A numbing cream may be placed on the skin over the port site. Your health care provider will put on a mask and sterile gloves. The skin over your port will be cleaned carefully with a germ-killing soap and allowed to dry. Your health care provider will gently pinch the port and insert a needle into it. Your health care provider will check for a blood return to make sure the port is in the vein and is still working (patent). If your port needs to remain accessed to get medicine continuously (constant infusion), your health care provider will place a clear bandage (dressing) over the needle site. The dressing and needle will need to be changed every week, or as told by your health care provider. What is  flushing? Flushing helps keep the port working. Follow instructions from your health care provider about how and when to flush the port. Ports are usually flushed with saline solution or a medicine called heparin. The need for flushing will depend on how the port is used: If the port is only used from time to time to give medicines or draw blood, the port may need to be flushed: Before and after medicines have been given. Before and after blood has been drawn. As part of routine maintenance. Flushing may be recommended every 4-6 weeks. If a constant infusion is running, the port may not need to be flushed. Throw away any syringes in a disposal container that is meant for sharp items (sharps container). You can buy a sharps container from a pharmacy, or you can make one by using an empty hard plastic bottle with a cover. How long will my port stay implanted? The port can stay in for as long as your health care provider thinks it is needed. When it is time for the port to come out, a surgery will be done to remove it. The surgery will be similar to the procedure that was done to put the port in. Follow these instructions at home: Caring for your port and port site Flush your port as told by your health care provider. If you need an infusion over several days, follow instructions from your health   care provider about how to take care of your port site. Make sure you: Change your dressing as told by your health care provider. Wash your hands with soap and water for at least 20 seconds before and after you change your dressing. If soap and water are not available, use alcohol-based hand sanitizer. Place any used dressings or infusion bags into a plastic bag. Throw that bag in the trash. Keep the dressing that covers the needle clean and dry. Do not get it wet. Do not use scissors or sharp objects near the infusion tubing. Keep any external tubes clamped, unless they are being used. Check your port site  every day for signs of infection. Check for: Redness, swelling, or pain. Fluid or blood. Warmth. Pus or a bad smell. Protect the skin around the port site. Avoid wearing bra straps that rub or irritate the site. Protect the skin around your port from seat belts. Place a soft pad over your chest if needed. Bathe or shower as told by your health care provider. The site may get wet as long as you are not actively receiving an infusion. General instructions  Return to your normal activities as told by your health care provider. Ask your health care provider what activities are safe for you. Carry a medical alert card or wear a medical alert bracelet at all times. This will let health care providers know that you have an implanted port in case of an emergency. Where to find more information American Cancer Society: www.cancer.org American Society of Clinical Oncology: www.cancer.net Contact a health care provider if: You have a fever or chills. You have redness, swelling, or pain at the port site. You have fluid or blood coming from your port site. Your incision feels warm to the touch. You have pus or a bad smell coming from the port site. Summary Implanted ports are usually placed in the chest for long-term IV access. Follow instructions from your health care provider about flushing the port and changing bandages (dressings). Take care of the area around your port by avoiding clothing that puts pressure on the area, and by watching for signs of infection. Protect the skin around your port from seat belts. Place a soft pad over your chest if needed. Contact a health care provider if you have a fever or you have redness, swelling, pain, fluid, or a bad smell at the port site. This information is not intended to replace advice given to you by your health care provider. Make sure you discuss any questions you have with your health care provider. Document Revised: 10/24/2020 Document Reviewed:  10/24/2020 Elsevier Patient Education  2023 Elsevier Inc.  

## 2021-12-03 NOTE — Telephone Encounter (Signed)
Pt has been advised of Pre-Admission date/time, and Surgery date in person.  Surgery Date: 12/05/21 Preadmission Testing Date: 12/04/21 (phone 8a-1p)  Patient has been made aware to call [phone], between 1-3:00pm the day before surgery, to find out what time to arrive for surgery.

## 2021-12-03 NOTE — Progress Notes (Signed)
12/03/2021  History of Present Illness: ERICHA Harrison is a 66 y.o. female with a history of left breast cancer, s/p left breast lumpectomy and sentinel lymph node biopsy on 11/20/21.  Pathology showed an 8 mm grade 3 ER/PR/Her2 negative cancer.  Lymph nodes were negative.  However, given the size > 5 mm and triple negative, the final recommendation is for chemotherapy.  She reports that she's doing well after her surgery and denies any worsening pain.  She did have some mild pain yesterday in the medial portion of left breast, but that resolved on its own.  She has met with Dr. Janese Banks and discussed starting chemotherapy on 12/12/21.  She now needs a port-a-cath.  Past Medical History: Past Medical History:  Diagnosis Date   Anemia    Cutaneous sarcoidosis    Degenerative disc disease    Diverticulosis    Dysplastic nevus 11/19/2017   L mid back 6.0 cm lat to spine   Foot pain    GERD (gastroesophageal reflux disease)    Hyperlipidemia    Malignant neoplasm of upper-outer quadrant of left breast in female, estrogen receptor negative ([place]) 11/04/2021   Migraine    Shingles    eyes     Past Surgical History: Past Surgical History:  Procedure Laterality Date   2D Echo  1999   Negative   ABDOMINAL HYSTERECTOMY  03/1998   APPENDECTOMY     Arm lesion, cutaneous sarcoid  12/2005   with normal CXR and ANA   BREAST BIOPSY Left 10/23/2021   [facility] biopsy/ heart clip/ [facility]   BREAST LUMPECTOMY WITH RADIOFREQUENCY TAG IDENTIFICATION Left 70/62/3762   lt br rf tag placement   BREAST LUMPECTOMY,RADIO FREQ [place] BIOPSY Left 11/20/2021   Procedure: BREAST LUMPECTOMY,RADIO FREQ LOCALIZER,AXILLARY SENTINEL LYMPH NODE BIOPSY;  Surgeon: Olean Ree, MD;  Location: [facility] ORS;  Service: General;  Laterality: Left;   CESAREAN SECTION     x2   COLONOSCOPY  11/0/2009,08/18/13   diverticulosis and hemorrhoids   DIAGNOSTIC LAPAROSCOPY     EYE SURGERY  10/13/2018   had shingles  in left eye, had to have surgery, has contact lens in now.   FOOT NEUROMA SURGERY Left    LAPAROSCOPIC APPENDECTOMY N/A 02/18/2016   Procedure: APPENDECTOMY LAPAROSCOPIC;  Surgeon: Ralene Ok, MD;  Location: [place];  Service: General;  Laterality: N/A;   LUMBAR LAMINECTOMY  10/2017   ROTATOR CUFF REPAIR Left    TONSILLECTOMY     UPPER GASTROINTESTINAL ENDOSCOPY  11/15/2010   non-erosive gastritis, H. pylori negative    Home Medications: Prior to Admission medications   Medication Sig Start Date End Date Taking? Authorizing Provider  acetaminophen (TYLENOL) 500 MG tablet Take 2 tablets (1,000 mg total) by mouth every 6 (six) hours as needed for mild pain. 11/20/21  Yes Brekyn Huntoon, Jacqulyn Bath, MD  aspirin 81 MG tablet Take 81 mg by mouth daily.    Yes [provider]  atorvastatin (LIPITOR) 10 MG tablet Take 1 tablet (10 mg total) by mouth daily. Patient taking differently: Take 10 mg by mouth at bedtime. 02/15/21  Yes Tower, Wynelle Fanny, MD  calcium carbonate (TUMS - DOSED IN MG ELEMENTAL CALCIUM) 500 MG chewable tablet Chew 1 tablet by mouth as needed for indigestion or heartburn.   Yes [provider]  Cholecalciferol (VITAMIN D3) 50 MCG (2000 UT) TABS Take 1 tablet by mouth daily.   Yes [provider]  dexamethasone (DECADRON) 4 MG tablet Take 2 tablets (8 mg  total) by mouth 2 (two) times daily. Start the day before Taxotere. Then again the day after chemo for 3 days. 11/30/21  Yes Sindy Guadeloupe, MD  ferrous sulfate 325 (65 FE) MG tablet Take 325 mg by mouth daily.   Yes [provider]  fluticasone (FLONASE) 50 MCG/ACT nasal spray USE ONE SPRAY(S) IN EACH NOSTRIL ONCE DAILY AS DIRECTED Patient taking differently: 1 spray at bedtime. USE ONE SPRAY(S) IN EACH NOSTRIL ONCE DAILY AS DIRECTED 02/15/21  Yes Tower, Wynelle Fanny, MD  gabapentin (NEURONTIN) 300 MG capsule Take 300 mg by mouth daily after supper.   Yes [provider]  ibuprofen (ADVIL) 800 MG tablet  Take 1 tablet (800 mg total) by mouth every 8 (eight) hours as needed for moderate pain. 11/20/21  Yes Willadeen Colantuono, Jacqulyn Bath, MD  lidocaine-prilocaine (EMLA) cream Apply to affected area once 11/30/21  Yes Sindy Guadeloupe, MD  LORazepam (ATIVAN) 0.5 MG tablet Take 1 tablet (0.5 mg total) by mouth every 6 (six) hours as needed (Nausea or vomiting). 11/30/21  Yes Sindy Guadeloupe, MD  Magnesium Hydroxide (DULCOLAX PO) Take 1 tablet by mouth 2 (two) times daily.   Yes [provider]  meloxicam (MOBIC) 15 MG tablet Take 1 tablet (15 mg total) by mouth daily as needed for pain. Patient taking differently: Take 15 mg by mouth daily. 02/15/21  Yes Tower, Wynelle Fanny, MD  Multiple Vitamins-Minerals (ONE-A-DAY WOMENS 50 PLUS PO) Take 1 tablet by mouth daily.   Yes [provider]  ondansetron (ZOFRAN) 8 MG tablet Take 1 tablet (8 mg total) by mouth 2 (two) times daily as needed for refractory nausea / vomiting. Start on day 3 after chemo. 11/30/21  Yes Sindy Guadeloupe, MD  prednisoLONE acetate (PRED FORTE) 1 % ophthalmic suspension Place 1 drop into the left eye every other day.   Yes [provider]  prochlorperazine (COMPAZINE) 10 MG tablet Take 1 tablet (10 mg total) by mouth every 6 (six) hours as needed (Nausea or vomiting). 11/30/21  Yes Sindy Guadeloupe, MD  Timolol Maleate 0.5 % (DAILY) SOLN Place 1 drop into both eyes daily at 6 (six) AM. 08/16/16  Yes [provider]    Allergies: Allergies  Allergen Reactions   Codeine     REACTION: syncope   Sumatriptan     REACTION: choking sensation    Review of Systems: Review of Systems  Constitutional:  Negative for chills and fever.  HENT:  Negative for hearing loss.   Respiratory:  Negative for shortness of breath.   Cardiovascular:  Negative for chest pain.  Gastrointestinal:  Negative for abdominal pain, nausea and vomiting.  Genitourinary:  Negative for dysuria.  Musculoskeletal:  Negative for myalgias.  Skin:  Negative for  rash.       Ecchymosis at incision sites.  Neurological:  Negative for dizziness.  Psychiatric/Behavioral:  Negative for depression.     Physical Exam BP 140/86   Pulse 69   Temp 98.5 F (36.9 C) (Oral)   Wt 182 lb 6.4 oz (82.7 kg)   SpO2 98%   BMI 31.80 kg/m  CONSTITUTIONAL: No acute distress, well nourished. HEENT:  Normocephalic, atraumatic, extraocular motion intact. NECK: Trachea is midline, no jugular venous distention RESPIRATORY:  Normal respiratory effort without pathologic use of accessory muscles. CARDIOVASCULAR: Regular rhythm and rate. BREAST:  Left breast s/p lumpectomy.  Scar healing well with some surrounding ecchymosis.  There is still residual methylene blue and skin reaction in the lateral periareolar  area.  No skin necrosis and no evidence of seroma.  Left axilla s/p sentinel node biopsy, with incision also healing well and also with some mild ecchymosis.  No significant tenderness on either site. MUSCULOSKELETAL:  Normal gait, no peripheral edema. NEUROLOGIC:  Motor and sensation is grossly normal.  Cranial nerves are grossly intact. PSYCH:  Alert and oriented to person, place and time. Affect is normal.   Assessment and Plan: [name] with left breast cancer ER/PR/Her2 negative.  --Patient requires chemotherapy given the final pathology results.  Discussed with her the reason for port-a-cath placement as we need access to a more central vein for infusion.  Showed her a sample port-a-cath and how the catheter would go into the vein and the port would be subcutaneous over the upper right chest.  Discussed the possibilities of either using subclavian vein or jugular vein depending on accessibility.   --Reviewed the surgery at length with her including  the incisions, risks the bleeding, infection, injury to surrounding structures, post-op recovery, eventual plan for excision of the catheter, and she's willing to proceeed. --Will schedule the patient  for 12/05/21.  All of her questions have been answered.  I spent 40 minutes dedicated to the care of this patient on the date of this encounter to include pre-visit review of records, face-to-face time with the patient discussing diagnosis and management, and any post-visit coordination of care.   Melvyn Neth, [facility]

## 2021-12-04 ENCOUNTER — Encounter
Admission: RE | Admit: 2021-12-04 | Discharge: 2021-12-04 | Disposition: A | Discharge status: Home / Self Care | Payer: Medicare PPO | Source: Ambulatory Visit | Attending: Surgery | Admitting: Surgery

## 2021-12-04 MED ORDER — CHLORHEXIDINE GLUCONATE CLOTH 2 % EX PADS: 6.0000 | MEDICATED_PAD | Freq: Once | CUTANEOUS | Status: DC

## 2021-12-04 MED ORDER — LACTATED RINGERS IV SOLN: INTRAVENOUS | Status: DC

## 2021-12-04 MED ORDER — ACETAMINOPHEN 500 MG PO TABS: 1000.0000 mg | ORAL_TABLET | ORAL | Status: AC

## 2021-12-04 MED ORDER — CEFAZOLIN SODIUM-DEXTROSE 2-4 GM/100ML-% IV SOLN
2.0000 g | INTRAVENOUS | Status: AC
  Administered 2021-12-05: 2 g via INTRAVENOUS

## 2021-12-04 MED ORDER — GABAPENTIN 300 MG PO CAPS: 300.0000 mg | ORAL_CAPSULE | ORAL | Status: AC

## 2021-12-04 MED ORDER — CHLORHEXIDINE GLUCONATE 0.12 % MT SOLN: 15.0000 mL | Freq: Once | OROMUCOSAL | Status: AC

## 2021-12-04 MED ORDER — ORAL CARE MOUTH RINSE: 15.0000 mL | Freq: Once | OROMUCOSAL | Status: AC

## 2021-12-04 MED ORDER — CHLORHEXIDINE GLUCONATE CLOTH 2 % EX PADS
6.0000 | MEDICATED_PAD | Freq: Once | CUTANEOUS | Status: AC
  Administered 2021-12-05: 6 via TOPICAL

## 2021-12-04 MED ORDER — FAMOTIDINE 20 MG PO TABS: 20.0000 mg | ORAL_TABLET | Freq: Once | ORAL | Status: AC

## 2021-12-04 NOTE — Patient Instructions (Signed)
Your procedure is scheduled on: 12/05/21 Report to [place]. To find out your arrival time please call [phone] between 1PM - 3PM on 12/04/21.  Remember: Instructions that are not followed completely may result in serious medical risk, up to and including death, or upon the discretion of your surgeon and anesthesiologist your surgery may need to be rescheduled.     _X__ 1. Do not eat food after midnight the night before your procedure.                 No gum chewing or hard candies. You may drink clear liquids up to 2 hours                 before you are scheduled to arrive for your surgery- DO not drink clear                 liquids within 2 hours of the start of your surgery.                 Clear Liquids include:  water, apple juice without pulp, clear carbohydrate                 drink such as Clearfast or Gatorade, Black Coffee or Tea (Do not add                 anything to coffee or tea). Diabetics water only  __X__2.  On the morning of surgery brush your teeth with toothpaste and water, you                 may rinse your mouth with mouthwash if you wish.  Do not swallow any              toothpaste of mouthwash.     _X__ 3.  No Alcohol for 24 hours before or after surgery.   _X__ 4.  Do Not Smoke or use e-cigarettes For 24 Hours Prior to Your Surgery.                 Do not use any chewable tobacco products for at least 6 hours prior to                 surgery.  ____  5.  Bring all medications with you on the day of surgery if instructed.   __X__  6.  Notify your doctor if there is any change in your medical condition      (cold, fever, infections).     Do not wear jewelry, make-up, hairpins, clips or nail polish. Do not wear lotions, powders, or perfumes.  Do not shave 48 hours prior to surgery. Men may shave face and neck. Do not bring valuables to the hospital.    [facility] is not responsible for any belongings or  valuables.  Contacts, dentures/partials or body piercings may not be worn into surgery. Bring a case for your contacts, glasses or hearing aids, a denture cup will be supplied. Leave your suitcase in the car. After surgery it may be brought to your room. For patients admitted to the hospital, discharge time is determined by your treatment team.   Patients discharged the day of surgery will not be allowed to drive home.   Please read over the following fact sheets that you were given:   MRSA Information  __X__ Take these medicines the morning of surgery with A SIP OF WATER:  1. none  2.   3.   4.  5.  6. MAY USE EYE DROPS AND OR NASAL SPRAY  ____ Fleet Enema (as directed)   ____ Use CHG Soap/SAGE wipes as directed  ____ Use inhalers on the day of surgery  ____ Stop metformin/Janumet/Farxiga 2 days prior to surgery    ____ Take 1/2 of usual insulin dose the night before surgery. No insulin the morning          of surgery.   ____ Stop Blood Thinners Coumadin/Plavix/Xarelto/Pleta/Pradaxa/Eliquis/Effient/Aspirin  on   Or contact your Surgeon, Cardiologist or Medical Doctor regarding  ability to stop your blood thinners  ____ Stop Anti-inflammatories 7 days before surgery such as Advil, Ibuprofen, Motrin,  BC or Goodies Powder, Naprosyn, Naproxen, Aleve, Aspirin    ____ Stop all herbal supplements, fish oil or vitamin E until after surgery.    ____ Bring C-Pap to the hospital.    HOLD YOUR MELOXICAM, IRON, MULTIVITAMIN AND VITAMIN D UNTIL AFTER YOUR SURGERY. MAY BE RESUMED ONCE YOU ARE HOME.  SHOWER WITH YOUR REGULAR SOAP BEFORE ARRIVING FOR SURGERY AND DO NOT WEAR ANY DEODORANT.

## 2021-12-04 NOTE — Pre-Procedure Instructions (Signed)
Reviewed with patient medical and surgical history, medication list. Pre surgical instructions given. Patient verbalized understanding. Will call back with any questions or concerns.

## 2021-12-05 ENCOUNTER — Encounter: Payer: Self-pay | Admitting: Surgery

## 2021-12-05 ENCOUNTER — Ambulatory Visit: Payer: Medicare PPO | Admitting: Certified Registered"

## 2021-12-05 ENCOUNTER — Other Ambulatory Visit: Payer: Self-pay

## 2021-12-05 ENCOUNTER — Ambulatory Visit
Admission: RE | Admit: 2021-12-05 | Discharge: 2021-12-05 | Disposition: A | Discharge status: Home / Self Care | Payer: Medicare PPO | Attending: Surgery | Admitting: Surgery

## 2021-12-05 ENCOUNTER — Encounter: Payer: Medicare PPO | Admitting: Surgery

## 2021-12-05 ENCOUNTER — Encounter
Admission: RE | Disposition: A | Discharge status: Home / Self Care | Payer: Self-pay | Source: Home / Self Care | Attending: Surgery

## 2021-12-05 ENCOUNTER — Ambulatory Visit: Payer: Medicare PPO

## 2021-12-05 DIAGNOSIS — Z452 Encounter for adjustment and management of vascular access device: Secondary | ICD-10-CM | POA: Diagnosis not present

## 2021-12-05 DIAGNOSIS — Z79899 Other long term (current) drug therapy: Secondary | ICD-10-CM | POA: Diagnosis not present

## 2021-12-05 DIAGNOSIS — Z171 Estrogen receptor negative status [ER-]: Secondary | ICD-10-CM

## 2021-12-05 DIAGNOSIS — T7840XA Allergy, unspecified, initial encounter: Secondary | ICD-10-CM | POA: Diagnosis not present

## 2021-12-05 DIAGNOSIS — C50912 Malignant neoplasm of unspecified site of left female breast: Secondary | ICD-10-CM | POA: Insufficient documentation

## 2021-12-05 DIAGNOSIS — J984 Other disorders of lung: Secondary | ICD-10-CM | POA: Diagnosis not present

## 2021-12-05 DIAGNOSIS — C50412 Malignant neoplasm of upper-outer quadrant of left female breast: Secondary | ICD-10-CM | POA: Diagnosis not present

## 2021-12-05 HISTORY — PX: PORTACATH PLACEMENT: SHX2246

## 2021-12-05 SURGERY — INSERTION, TUNNELED CENTRAL VENOUS DEVICE, WITH PORT
Anesthesia: Monitor Anesthesia Care | Laterality: Right

## 2021-12-05 MED ORDER — CHLORHEXIDINE GLUCONATE 0.12 % MT SOLN
OROMUCOSAL | Status: AC
  Administered 2021-12-05: 15 mL via OROMUCOSAL
  Filled 2021-12-05: qty 15

## 2021-12-05 MED ORDER — OXYCODONE HCL 5 MG PO TABS: 5.0000 mg | ORAL_TABLET | Freq: Once | ORAL | Status: DC | PRN

## 2021-12-05 MED ORDER — SODIUM CHLORIDE FLUSH 0.9 % IV SOLN
INTRAVENOUS | Status: AC
  Filled 2021-12-05: qty 50

## 2021-12-05 MED ORDER — LIDOCAINE HCL (PF) 1 % IJ SOLN
INTRAMUSCULAR | Status: DC | PRN
  Administered 2021-12-05: 20 mL

## 2021-12-05 MED ORDER — OXYCODONE HCL 5 MG/5ML PO SOLN: 5.0000 mg | Freq: Once | ORAL | Status: DC | PRN

## 2021-12-05 MED ORDER — LIDOCAINE HCL (PF) 1 % IJ SOLN
INTRAMUSCULAR | Status: AC
  Filled 2021-12-05: qty 30

## 2021-12-05 MED ORDER — PROPOFOL 500 MG/50ML IV EMUL: INTRAVENOUS | Status: DC | PRN

## 2021-12-05 MED ORDER — PROPOFOL 10 MG/ML IV BOLUS
INTRAVENOUS | Status: DC | PRN
  Administered 2021-12-05: 150 mg via INTRAVENOUS

## 2021-12-05 MED ORDER — ACETAMINOPHEN 500 MG PO TABS
ORAL_TABLET | ORAL | Status: AC
  Administered 2021-12-05: 1000 mg via ORAL
  Filled 2021-12-05: qty 2

## 2021-12-05 MED ORDER — ONDANSETRON HCL 4 MG/2ML IJ SOLN
INTRAMUSCULAR | Status: DC | PRN
  Administered 2021-12-05: 4 mg via INTRAVENOUS

## 2021-12-05 MED ORDER — DEXAMETHASONE SODIUM PHOSPHATE 10 MG/ML IJ SOLN
INTRAMUSCULAR | Status: DC | PRN
  Administered 2021-12-05: 10 mg via INTRAVENOUS

## 2021-12-05 MED ORDER — EPHEDRINE SULFATE (PRESSORS) 50 MG/ML IJ SOLN
INTRAMUSCULAR | Status: DC | PRN
  Administered 2021-12-05: 20 mg via INTRAVENOUS
  Administered 2021-12-05 (×2): 10 mg via INTRAVENOUS

## 2021-12-05 MED ORDER — FENTANYL CITRATE (PF) 100 MCG/2ML IJ SOLN: 25.0000 ug | INTRAMUSCULAR | Status: DC | PRN

## 2021-12-05 MED ORDER — ACETAMINOPHEN 500 MG PO TABS: 1000.0000 mg | ORAL_TABLET | Freq: Four times a day (QID) | ORAL | Status: DC | PRN

## 2021-12-05 MED ORDER — MIDAZOLAM HCL 2 MG/2ML IJ SOLN
INTRAMUSCULAR | Status: DC | PRN
  Administered 2021-12-05: 2 mg via INTRAVENOUS

## 2021-12-05 MED ORDER — HEPARIN SODIUM (PORCINE) 5000 UNIT/ML IJ SOLN
INTRAMUSCULAR | Status: AC
  Filled 2021-12-05: qty 1

## 2021-12-05 MED ORDER — BUPIVACAINE HCL (PF) 0.5 % IJ SOLN
INTRAMUSCULAR | Status: AC
  Filled 2021-12-05: qty 30

## 2021-12-05 MED ORDER — FENTANYL CITRATE (PF) 100 MCG/2ML IJ SOLN
INTRAMUSCULAR | Status: AC
  Filled 2021-12-05: qty 2

## 2021-12-05 MED ORDER — MIDAZOLAM HCL 2 MG/2ML IJ SOLN
INTRAMUSCULAR | Status: AC
  Filled 2021-12-05: qty 2

## 2021-12-05 MED ORDER — FENTANYL CITRATE (PF) 100 MCG/2ML IJ SOLN
INTRAMUSCULAR | Status: DC | PRN
  Administered 2021-12-05 (×2): 25 ug via INTRAVENOUS

## 2021-12-05 MED ORDER — LIDOCAINE HCL (CARDIAC) PF 100 MG/5ML IV SOSY
PREFILLED_SYRINGE | INTRAVENOUS | Status: DC | PRN
  Administered 2021-12-05: 50 mg via INTRAVENOUS

## 2021-12-05 MED ORDER — GABAPENTIN 300 MG PO CAPS
ORAL_CAPSULE | ORAL | Status: AC
  Administered 2021-12-05: 300 mg via ORAL
  Filled 2021-12-05: qty 1

## 2021-12-05 MED ORDER — FAMOTIDINE 20 MG PO TABS
ORAL_TABLET | ORAL | Status: AC
  Administered 2021-12-05: 20 mg via ORAL
  Filled 2021-12-05: qty 1

## 2021-12-05 MED ORDER — CEFAZOLIN SODIUM-DEXTROSE 2-4 GM/100ML-% IV SOLN
INTRAVENOUS | Status: AC
  Filled 2021-12-05: qty 100

## 2021-12-05 MED ORDER — SODIUM CHLORIDE 0.9 % IV SOLN
INTRAVENOUS | Status: DC | PRN
  Administered 2021-12-05: 20 mL via INTRAMUSCULAR

## 2021-12-05 MED ORDER — OXYCODONE HCL 5 MG PO TABS: 5.0000 mg | ORAL_TABLET | Freq: Four times a day (QID) | ORAL | 0 refills | Status: DC | PRN

## 2021-12-05 SURGICAL SUPPLY — 40 items
ADH SKN CLS APL DERMABOND .7 (GAUZE/BANDAGES/DRESSINGS) ×1
APL PRP STRL LF DISP 70% ISPRP (MISCELLANEOUS) ×1
BAG DECANTER FOR FLEXI CONT (MISCELLANEOUS) ×2 IMPLANT
BLADE SURG SZ11 CARB STEEL (BLADE) ×2 IMPLANT
CHLORAPREP W/TINT 26 (MISCELLANEOUS) ×2 IMPLANT
COVER LIGHT HANDLE STERIS (MISCELLANEOUS) ×4 IMPLANT
DERMABOND ADVANCED (GAUZE/BANDAGES/DRESSINGS) ×1
DERMABOND ADVANCED .7 DNX12 (GAUZE/BANDAGES/DRESSINGS) ×1 IMPLANT
DRAPE C-ARM XRAY 36X54 (DRAPES) ×2 IMPLANT
ELECT CAUTERY BLADE TIP 2.5 (TIP) ×2
ELECT REM PT RETURN 9FT ADLT (ELECTROSURGICAL) ×2
ELECTRODE CAUTERY BLDE TIP 2.5 (TIP) ×1 IMPLANT
ELECTRODE REM PT RTRN 9FT ADLT (ELECTROSURGICAL) ×1 IMPLANT
GAUZE 4X4 16PLY ~~LOC~~+RFID DBL (SPONGE) ×2 IMPLANT
GLOVE SURG SYN 7.0 (GLOVE) ×2 IMPLANT
GLOVE SURG SYN 7.0 PF PI (GLOVE) ×1 IMPLANT
GLOVE SURG SYN 7.5  E (GLOVE) ×2
GLOVE SURG SYN 7.5 E (GLOVE) ×1 IMPLANT
GLOVE SURG SYN 7.5 PF PI (GLOVE) ×1 IMPLANT
GOWN STRL REUS W/ TWL LRG LVL3 (GOWN DISPOSABLE) ×2 IMPLANT
GOWN STRL REUS W/TWL LRG LVL3 (GOWN DISPOSABLE) ×4
IV NS 500ML (IV SOLUTION) ×2
IV NS 500ML BAXH (IV SOLUTION) ×1 IMPLANT
KIT PORT POWER 8FR ISP CVUE (Port) ×2 IMPLANT
KIT TURNOVER KIT A (KITS) ×2 IMPLANT
LABEL OR SOLS (LABEL) ×2 IMPLANT
MANIFOLD NEPTUNE II (INSTRUMENTS) ×2 IMPLANT
NDL FILTER BLUNT 18X1 1/2 (NEEDLE) ×1 IMPLANT
NEEDLE FILTER BLUNT 18X 1/2SAF (NEEDLE) ×1
NEEDLE FILTER BLUNT 18X1 1/2 (NEEDLE) ×1 IMPLANT
NEEDLE HYPO 22GX1.5 SAFETY (NEEDLE) ×2 IMPLANT
PACK PORT-A-CATH (MISCELLANEOUS) ×2 IMPLANT
SPIKE FLUID TRANSFER (MISCELLANEOUS) ×6 IMPLANT
SUT MNCRL AB 4-0 PS2 18 (SUTURE) ×2 IMPLANT
SUT PROLENE 3 0 SH DA (SUTURE) ×2 IMPLANT
SUT VIC AB 3-0 SH 27 (SUTURE) ×2
SUT VIC AB 3-0 SH 27X BRD (SUTURE) ×1 IMPLANT
SYR 10ML LL (SYRINGE) ×2 IMPLANT
SYR 3ML LL SCALE MARK (SYRINGE) ×2 IMPLANT
WATER STERILE IRR 500ML POUR (IV SOLUTION) ×2 IMPLANT

## 2021-12-05 NOTE — Interval H&P Note (Signed)
History and Physical Interval Note:  12/05/2021 10:44 AM  Megan Harrison  has presented today for surgery, with the diagnosis of left breast cancer.  The various methods of treatment have been discussed with the patient and family. After consideration of risks, benefits and other options for treatment, the patient has consented to  Procedure(s): INSERTION PORT-A-CATH (Right) as a surgical intervention.  The patient's history has been reviewed, patient examined, no change in status, stable for surgery.  I have reviewed the patient's chart and labs.  Questions were answered to the patient's satisfaction.     Niomie Englert

## 2021-12-05 NOTE — Anesthesia Preprocedure Evaluation (Addendum)
Anesthesia Evaluation   Patient awake    Reviewed: Allergy & Precautions, NPO status , Patient's Chart, lab work & pertinent test results  History of Anesthesia Complications Negative for: history of anesthetic complications  Airway Mallampati: II  TM Distance: >3 FB Neck ROM: Full    Dental  (+) Teeth Intact, Dental Advisory Given   Pulmonary neg pulmonary ROS, neg shortness of breath, neg recent URI,    Pulmonary exam normal breath sounds clear to auscultation       Cardiovascular (-) hypertension(-) CAD, (-) Past MI and (-) CABG negative cardio ROS Normal cardiovascular exam Rhythm:Regular Rate:Normal     Neuro/Psych negative neurological ROS  negative psych ROS   GI/Hepatic negative GI ROS, Neg liver ROS, GERD  ,  Endo/Other  negative endocrine ROS  Renal/GU      Musculoskeletal   Abdominal   Peds  Hematology negative hematology ROS (+)   Anesthesia Other Findings Past Medical History: No date: Anemia No date: Cutaneous sarcoidosis No date: Degenerative disc disease No date: Diverticulosis 11/19/2017: Dysplastic nevus     Comment:  L mid back 6.0 cm lat to spine No date: Foot pain No date: GERD (gastroesophageal reflux disease) No date: Hyperlipidemia 11/04/2021: Malignant neoplasm of upper-outer quadrant of left breast  in female, estrogen receptor negative ([place]) No date: Migraine No date: Shingles     Comment:  eyes  Past Surgical History: 1999: 2D Echo     Comment:  Negative 03/1998: ABDOMINAL HYSTERECTOMY No date: APPENDECTOMY 12/2005: Arm lesion, cutaneous sarcoid     Comment:  with normal CXR and ANA 10/23/2021: BREAST BIOPSY; Left     Comment:  US biopsy/ heart clip/ [facility] 11/13/2021: BREAST LUMPECTOMY WITH RADIOFREQUENCY TAG IDENTIFICATION;  Left     Comment:  lt br rf tag placement No date: CESAREAN SECTION     Comment:  x2 11/0/2009,08/18/13: COLONOSCOPY     Comment:  diverticulosis  and hemorrhoids No date: DIAGNOSTIC LAPAROSCOPY 10/13/2018: EYE SURGERY     Comment:  had shingles in left eye, had to have surgery, has               contact lens in now. No date: FOOT NEUROMA SURGERY; Left 02/18/2016: LAPAROSCOPIC APPENDECTOMY; N/A     Comment:  Procedure: APPENDECTOMY LAPAROSCOPIC;  Surgeon: Ralene Ok, MD;  Location: [place];  Service: General;                Laterality: N/A; 10/2017: LUMBAR LAMINECTOMY No date: ROTATOR CUFF REPAIR; Left No date: TONSILLECTOMY 11/15/2010: UPPER GASTROINTESTINAL ENDOSCOPY     Comment:  non-erosive gastritis, H. pylori negative  BMI    Body Mass Index: 32.01 kg/m      Reproductive/Obstetrics negative OB ROS                             Anesthesia Physical  Anesthesia Plan  ASA: 2  Anesthesia Plan: MAC   Post-op Pain Management:    Induction: Intravenous, Rapid sequence and Cricoid pressure planned  PONV Risk Score and Plan: 3 and Ondansetron, Midazolam, Propofol infusion, TIVA and Treatment may vary due to age or medical condition  Airway Management Planned: LMA  Additional Equipment:   Intra-op Plan:   Post-operative Plan: Extubation in OR  Informed Consent: I have reviewed the patients History and Physical, chart, labs and discussed the procedure including the risks, benefits  and alternatives for the proposed anesthesia with the patient or authorized representative who has indicated his/her understanding and acceptance.     Dental advisory given  Plan Discussed with: CRNA, Anesthesiologist and Surgeon  Anesthesia Plan Comments: (Patient consented for risks of anesthesia including but not limited to:  - adverse reactions to medications - damage to eyes, teeth, lips or other oral mucosa - nerve damage due to positioning  - sore throat or hoarseness - Damage to heart, brain, nerves, lungs, other parts of body or loss of life  Patient voiced understanding.)        Anesthesia Quick Evaluation

## 2021-12-05 NOTE — Transfer of Care (Signed)
Immediate Anesthesia Transfer of Care Note  Patient: YEVA BISSETTE  Procedure(s) Performed: INSERTION PORT-A-CATH (Right)  Patient Location: PACU  Anesthesia Type:General  Level of Consciousness: awake  Airway & Oxygen Therapy: Patient Spontanous Breathing  Post-op Assessment: Report given to RN and Post -op Vital signs reviewed and stable  Post vital signs: Reviewed and stable  Last Vitals:  Vitals Value Taken Time  BP 144/72 12/05/21 1224  Temp 36.1 C 12/05/21 1224  Pulse 66 12/05/21 1224  Resp 11 12/05/21 1224  SpO2 93 % 12/05/21 1224  Vitals shown include unvalidated device data.  Last Pain:  Vitals:   12/05/21 0900  TempSrc: Temporal  PainSc: 1          Complications: No notable events documented.

## 2021-12-05 NOTE — Discharge Instructions (Signed)

## 2021-12-05 NOTE — Op Note (Signed)
  Procedure Date:  12/05/2021  Pre-operative Diagnosis:  Left breast cancer  Post-operative Diagnosis: Left breast cancer  Procedure:  Right subclavian port-a-cath placement  Surgeon:  Melvyn Neth, MD  Anesthesia:  General endotracheal  Estimated Blood Loss:  5 ml  Specimens:  None  Complications:  None  Indications for Procedure:  [name] who requires a port-a-cath for chemotherapy.  The risks of bleeding, infection, injury to surrounding structures, thrombosis, nonfunction, pneumothorax, hemothorax, and need for further procedures were discussed with the patient and was willing to proceed.  Description of Procedure: The patient was correctly identified in the preoperative area and brought into the operating room.  The patient was placed supine with VTE prophylaxis in place.  Appropriate time-outs were performed.  Anesthesia was induced and the patient was intubated.  Appropriate antibiotics were infused.  The right chest and neck were prepped and draped in usual sterile fashion. The patient was placed in Trendelenburg position and local anesthetic was infiltrated into the skin and subcutaneous tissues in the anterior chest wall. Using ultrasound guidance, the large bore needle was placed into the subclavian vein without difficulty and then the Seldinger wire was advanced. Fluoroscopy was utilized to confirm that the Seldinger wire was in the superior vena cava.  The introducer dilator was placed over the Seldinger wire and the wire was removed. The previously flushed catheter was placed into the introducer dilator and the peel-away sheath was removed.  Fluoroscopy was used to confirm catheter location in the superior vena cava.  An incision was made and a port pocket developed with blunt and electrocautery dissection. The catheter was then tunneled subcutaneously to the port pocket.  The catheter was then cut to appropriate length and attached to the previously flushed  port. The port was placed into the pocket. Fluoroscopy again confirmed appropriate location of the catheter in the superior vena cava and revealed no kinking of the catheter at the insertion site.  The port was secured in place with 3-0 Prolenes and flushed for function and heparin locked.  The wound was closed with interrupted 3-0 Vicryl followed by 4-0 subcuticular Monocryl sutures and sealed with DermaBond.  The patient was emerged from anesthesia and extubated and brought to the recovery room for further management.  A chest x-ray was ordered.  The patient tolerated the procedure well and all counts were correct at the end of the case.   Melvyn Neth, MD

## 2021-12-06 NOTE — Anesthesia Postprocedure Evaluation (Signed)
Anesthesia Post Note  Patient: Megan Harrison  Procedure(s) Performed: INSERTION PORT-A-CATH (Right)  Patient location during evaluation: PACU Anesthesia Type: MAC Level of consciousness: awake and alert Pain management: pain level controlled Vital Signs Assessment: post-procedure vital signs reviewed and stable Respiratory status: spontaneous breathing, nonlabored ventilation, respiratory function stable and patient connected to nasal cannula oxygen Cardiovascular status: blood pressure returned to baseline and stable Postop Assessment: no apparent nausea or vomiting Anesthetic complications: no   No notable events documented.   Last Vitals:  Vitals:   12/05/21 1245 12/05/21 1259  BP: 123/71 (!) 144/64  Pulse: 66 65  Resp: 17 16  Temp: (!) 36.1 C (!) 36.1 C  SpO2: 96% 100%    Last Pain:  Vitals:   12/05/21 1259  TempSrc: Temporal  PainSc: 0-No pain                 Dimas Millin

## 2021-12-07 ENCOUNTER — Encounter: Payer: Self-pay | Admitting: Surgery

## 2021-12-10 ENCOUNTER — Inpatient Hospital Stay: Payer: Medicare PPO | Attending: Oncology

## 2021-12-10 ENCOUNTER — Other Ambulatory Visit: Payer: Self-pay

## 2021-12-10 DIAGNOSIS — Z171 Estrogen receptor negative status [ER-]: Secondary | ICD-10-CM | POA: Insufficient documentation

## 2021-12-10 DIAGNOSIS — E860 Dehydration: Secondary | ICD-10-CM | POA: Insufficient documentation

## 2021-12-10 DIAGNOSIS — Z79899 Other long term (current) drug therapy: Secondary | ICD-10-CM | POA: Insufficient documentation

## 2021-12-10 DIAGNOSIS — R5383 Other fatigue: Secondary | ICD-10-CM | POA: Insufficient documentation

## 2021-12-10 DIAGNOSIS — R519 Headache, unspecified: Secondary | ICD-10-CM | POA: Insufficient documentation

## 2021-12-10 DIAGNOSIS — Z5111 Encounter for antineoplastic chemotherapy: Secondary | ICD-10-CM | POA: Insufficient documentation

## 2021-12-10 DIAGNOSIS — C50412 Malignant neoplasm of upper-outer quadrant of left female breast: Secondary | ICD-10-CM | POA: Insufficient documentation

## 2021-12-10 DIAGNOSIS — Z5189 Encounter for other specified aftercare: Secondary | ICD-10-CM | POA: Insufficient documentation

## 2021-12-10 MED ORDER — CEPHALEXIN 500 MG PO CAPS: 500.0000 mg | ORAL_CAPSULE | Freq: Two times a day (BID) | ORAL | 0 refills | Status: AC

## 2021-12-11 ENCOUNTER — Encounter: Payer: Medicare PPO | Admitting: Physician Assistant

## 2021-12-11 ENCOUNTER — Encounter: Payer: Self-pay | Admitting: Physician Assistant

## 2021-12-11 ENCOUNTER — Ambulatory Visit (INDEPENDENT_AMBULATORY_CARE_PROVIDER_SITE_OTHER): Payer: Medicare PPO | Admitting: Physician Assistant

## 2021-12-11 VITALS — BP 145/88 | HR 66 | Temp 98.7°F | Ht 63.5 in | Wt 181.7 lb

## 2021-12-11 DIAGNOSIS — Z171 Estrogen receptor negative status [ER-]: Secondary | ICD-10-CM

## 2021-12-11 DIAGNOSIS — Z09 Encounter for follow-up examination after completed treatment for conditions other than malignant neoplasm: Secondary | ICD-10-CM

## 2021-12-11 DIAGNOSIS — C50412 Malignant neoplasm of upper-outer quadrant of left female breast: Secondary | ICD-10-CM

## 2021-12-11 MED FILL — Dexamethasone Sodium Phosphate Inj 100 MG/10ML: INTRAMUSCULAR | Qty: 1 | Status: AC

## 2021-12-11 NOTE — Progress Notes (Signed)
[facility] POST-OP OFFICE VISIT  12/11/2021  HPI: Megan Harrison is a 66 y.o. female 6 days s/p right subclavian port-a-cath placement with Dr Hampton Abbot and also 21 days s/p left breast RF tag-localized lumpectomy and sentinel lymph node biopsy also with Dr Hampton Abbot   In regards to her port, she is doing well. Sore. Some ecchymosis. Otherwise doing well. No issues.   She is concerned regarding her lumpectomy incision. On Sunday, she noticed some serous drainage from the lateral corner of her wound. No fever, chills. No erythema. Otherwise doing well. She was started on Keflex on 08/07 by Dr Hampton Abbot as a precaution.   Vital signs: BP (!) 145/88   Pulse 66   Temp 98.7 F (37.1 C) (Oral)   Ht 5' 3.5" (1.613 m)   Wt 181 lb 11.2 oz (82.4 kg)   SpO2 97%   BMI 31.68 kg/m    Physical Exam: Constitutional: Well appearing female, NAD Skin: Megan Harrison CMA present as chaperone. The left axillary incision is well healed; no erythema or drainage. There is a lumpectomy incision to the 1-2 o'clock position of the left breast. On the lateral aspect of this, there is a small <1 cm area of very superficial dehiscence which I suspect was source of her drainage. There is no depth to this. There is no erythema or appreciable drainage. This does not appear infected. Ecchymosis present as expected. Right subclavian port site is CDI with dermabond, no erythema, ecchymotic.   Assessment/Plan: [name] 6 days s/p right subclavian port-a-cath placement with Dr Hampton Abbot and also 21 days s/p left breast RF tag-localized lumpectomy and sentinel lymph node biopsy also with Dr Hampton Abbot    - No evidence of infection to left lumpectomy incisions. I think it is reasonable to continue Abx as prescribe out of precaution. If this interferes with her ability to start chemotherapy, I believe we can stop this given lack of infectious evidence on examination. Otherwise, okay to initiate chemotherapy as  planned.    - Reviewed wound care recommendation  - She can follow up on as needed basis; She understands to call with questions/concerns  -- Edison Simon, PA-C [facility] 12/11/2021, 10:59 AM M-F: 7am - 4pm

## 2021-12-11 NOTE — Patient Instructions (Signed)
If you have any concerns or questions, please feel free to call our office.   Lumpectomy, Care After The following information offers guidance on how to care for yourself after your procedure. Your health care provider may also give you more specific instructions. If you have problems or questions, contact your health care provider. What can I expect after the procedure? After the procedure, it is common to have: Some pain or redness at the incision site. Breast swelling. Breast tenderness. Stiffness in your arm or shoulder. A change in the shape and feel of your breast. Scar tissue that feels hard to the touch in the area where the lump was removed. Follow these instructions at home: Medicines Take over-the-counter and prescription medicines only as told by your health care provider. If you were prescribed an antibiotic, take it as told by your health care provider. Do not stop taking the antibiotic even if you start to feel better. Ask your health care provider if the medicine prescribed to you: Requires you to avoid driving or using machinery. Can cause constipation. You may need to take these actions to prevent or treat constipation: Drink enough fluid to keep your urine pale yellow. Take over-the-counter or prescription medicines. Eat foods that are high in fiber, such as beans, whole grains, and fresh fruits and vegetables. Limit foods that are high in fat and processed sugars, such as fried or sweet foods. Incision care     Follow instructions from your health care provider about how to take care of your incision. Make sure you: Wash your hands with soap and water for at least 20 seconds before and after you change your bandage (dressing). If soap and water are not available, use hand sanitizer. Change your dressing as told by your health care provider. Leave stitches (sutures), skin glue, or adhesive strips in place. These skin closures may need to stay in place for 2 weeks or  longer. If adhesive strip edges start to loosen and curl up, you may trim the loose edges. Do not remove adhesive strips completely unless your health care provider tells you to do that. Check your incision area every day for signs of infection. Check for: More redness, swelling, or pain. Fluid or blood. Warmth. Pus or a bad smell. Keep your dressing clean and dry. If you were sent home with a surgical drain in place, follow instructions from your health care provider about emptying it. Bathing Do not take baths, swim, or use a hot tub until your health care provider approves. Ask your health care provider if you may take showers. You may only be allowed to take sponge baths. Activity Rest as told by your health care provider. Do not sit for a long time without moving. Get up to take short walks every 1-2 hours. This will improve blood flow and breathing. Ask for help if you feel weak or unsteady. Be careful to avoid any activities that could cause an injury to your arm on the side of your surgery. Do not lift anything that is heavier than 10 lb (4.5 kg), or the limit that you are told, until your health care provider says that it is safe. Avoid lifting with the arm that is on the side of your surgery. Do not carry heavy objects on your shoulder on the side of your surgery. Do exercises to keep your shoulder and arm from getting stiff and swollen. Talk with your health care provider about which exercises are safe for you. Return to your   normal activities as told by your health care provider. Ask your health care provider what activities are safe for you. General instructions Wear a supportive bra as told by your health care provider. Raise (elevate) your arm above the level of your heart while you are sitting or lying down. Do not wear tight jewelry on your arm, wrist, or fingers on the side of your surgery. Wear compression stockings as told by your health care provider. These stockings help  to prevent blood clots and reduce swelling in your legs. If you had any lymph nodes removed during your procedure, be sure to tell all of your health care providers. It is important to share this information before you have certain procedures, such as blood tests or blood pressure measurements. Keep all follow-up visits. You may need to be screened for extra fluid around the lymph nodes and swelling in the breast and arm (lymphedema). Contact a health care provider if: You develop a rash. You have a fever. Your pain worsens or pain medicine is not working. You have swelling, weakness, or numbness in your arm that does not improve after a few weeks. You have new swelling in your breast. You have any of these signs of infection: More redness, swelling, or pain in your incision area. Fluid or blood coming from your incision. Warmth coming from the incision area. Pus or a bad smell coming from your incision. Get help right away if: You have very bad pain in your breast or arm. You have swelling in your legs or arms. You have redness, warmth, or pain in your leg or arm. You have chest pain. You have difficulty breathing. These symptoms may be an emergency. Get help right away. Call 911. Do not wait to see if the symptoms will go away. Do not drive yourself to the hospital. Summary After the procedure, it is common to have breast tenderness, swelling in your breast, and stiffness in your arm and shoulder. Follow instructions from your health care provider about how to take care of your incision. Do not lift anything that is heavier than 10 lb (4.5 kg), or the limit that you are told, until your health care provider says that it is safe. Avoid lifting with the arm that is on the side of your surgery. If you had any lymph nodes removed during your procedure, be sure to tell all of your health care providers. This information is not intended to replace advice given to you by your health care  provider. Make sure you discuss any questions you have with your health care provider. Document Revised: 07/01/2021 Document Reviewed: 07/01/2021 [employer] Patient Education  2023 [employer]  

## 2021-12-12 ENCOUNTER — Inpatient Hospital Stay: Payer: Medicare PPO

## 2021-12-12 ENCOUNTER — Inpatient Hospital Stay (HOSPITAL_BASED_OUTPATIENT_CLINIC_OR_DEPARTMENT_OTHER): Payer: Medicare PPO | Admitting: Medical Oncology

## 2021-12-12 ENCOUNTER — Encounter: Payer: Self-pay | Admitting: Medical Oncology

## 2021-12-12 ENCOUNTER — Encounter: Payer: Self-pay | Admitting: *Deleted

## 2021-12-12 VITALS — BP 146/77 | HR 66 | Temp 98.8°F | Resp 16 | Wt 181.9 lb

## 2021-12-12 VITALS — BP 146/71 | HR 66 | Temp 97.0°F | Resp 17

## 2021-12-12 DIAGNOSIS — L989 Disorder of the skin and subcutaneous tissue, unspecified: Secondary | ICD-10-CM

## 2021-12-12 DIAGNOSIS — C50412 Malignant neoplasm of upper-outer quadrant of left female breast: Secondary | ICD-10-CM

## 2021-12-12 DIAGNOSIS — Z5189 Encounter for other specified aftercare: Secondary | ICD-10-CM | POA: Diagnosis not present

## 2021-12-12 DIAGNOSIS — Z171 Estrogen receptor negative status [ER-]: Secondary | ICD-10-CM

## 2021-12-12 DIAGNOSIS — R519 Headache, unspecified: Secondary | ICD-10-CM | POA: Diagnosis not present

## 2021-12-12 DIAGNOSIS — Z5111 Encounter for antineoplastic chemotherapy: Secondary | ICD-10-CM | POA: Diagnosis not present

## 2021-12-12 DIAGNOSIS — R5383 Other fatigue: Secondary | ICD-10-CM | POA: Diagnosis not present

## 2021-12-12 DIAGNOSIS — R238 Other skin changes: Secondary | ICD-10-CM

## 2021-12-12 DIAGNOSIS — Z79899 Other long term (current) drug therapy: Secondary | ICD-10-CM | POA: Diagnosis not present

## 2021-12-12 DIAGNOSIS — E860 Dehydration: Secondary | ICD-10-CM | POA: Diagnosis not present

## 2021-12-12 LAB — COMPREHENSIVE METABOLIC PANEL
ALT: 17 U/L (ref 0–44)
AST: 26 U/L (ref 15–41)
Albumin: 4.1 g/dL (ref 3.5–5.0)
Alkaline Phosphatase: 81 U/L (ref 38–126)
Anion gap: 10 (ref 5–15)
BUN: 23 mg/dL (ref 8–23)
CO2: 24 mmol/L (ref 22–32)
Calcium: 9.8 mg/dL (ref 8.9–10.3)
Chloride: 106 mmol/L (ref 98–111)
Creatinine, Ser: 0.65 mg/dL (ref 0.44–1.00)
GFR, Estimated: >60 mL/min (ref >60)
Glucose, Bld: 150 mg/dL — ABNORMAL HIGH (ref 70–99)
Potassium: 3.9 mmol/L (ref 3.5–5.1)
Sodium: 140 mmol/L (ref 135–145)
Total Bilirubin: 0.3 mg/dL (ref 0.3–1.2)
Total Protein: 7.8 g/dL (ref 6.5–8.1)

## 2021-12-12 LAB — CBC WITH DIFFERENTIAL/PLATELET
Abs Immature Granulocytes: 0.07 10*3/uL (ref 0.00–0.07)
Basophils Absolute: 0 10*3/uL (ref 0.0–0.1)
Basophils Relative: 0 %
Eosinophils Absolute: 0 10*3/uL (ref 0.0–0.5)
Eosinophils Relative: 0 %
HCT: 37.2 % (ref 36.0–46.0)
Hemoglobin: 12.3 g/dL (ref 12.0–15.0)
Immature Granulocytes: 1 %
Lymphocytes Relative: 11 %
Lymphs Abs: 1.5 10*3/uL (ref 0.7–4.0)
MCH: 30.4 pg (ref 26.0–34.0)
MCHC: 33.1 g/dL (ref 30.0–36.0)
MCV: 91.9 fL (ref 80.0–100.0)
Monocytes Absolute: 0.6 10*3/uL (ref 0.1–1.0)
Monocytes Relative: 4 %
Neutro Abs: 11.8 10*3/uL — ABNORMAL HIGH (ref 1.7–7.7)
Neutrophils Relative %: 84 %
Platelets: 360 10*3/uL (ref 150–400)
RBC: 4.05 MIL/uL (ref 3.87–5.11)
RDW: 12.9 % (ref 11.5–15.5)
WBC: 13.9 10*3/uL — ABNORMAL HIGH (ref 4.0–10.5)
nRBC: 0 % (ref 0.0–0.2)

## 2021-12-12 MED ORDER — SODIUM CHLORIDE 0.9 % IV SOLN
Freq: Once | INTRAVENOUS | Status: AC
  Filled 2021-12-12: qty 250

## 2021-12-12 MED ORDER — SODIUM CHLORIDE 0.9 % IV SOLN
10.0000 mg | Freq: Once | INTRAVENOUS | Status: AC
  Administered 2021-12-12: 10 mg via INTRAVENOUS
  Filled 2021-12-12: qty 10

## 2021-12-12 MED ORDER — HEPARIN SOD (PORK) LOCK FLUSH 100 UNIT/ML IV SOLN
INTRAVENOUS | Status: AC
  Filled 2021-12-12: qty 5

## 2021-12-12 MED ORDER — SODIUM CHLORIDE 0.9 % IV SOLN
75.0000 mg/m2 | Freq: Once | INTRAVENOUS | Status: AC
  Administered 2021-12-12: 140 mg via INTRAVENOUS
  Filled 2021-12-12: qty 14

## 2021-12-12 MED ORDER — HEPARIN SOD (PORK) LOCK FLUSH 100 UNIT/ML IV SOLN
500.0000 [IU] | Freq: Once | INTRAVENOUS | Status: AC | PRN
  Administered 2021-12-12: 500 [IU]
  Filled 2021-12-12: qty 5

## 2021-12-12 MED ORDER — PALONOSETRON HCL INJECTION 0.25 MG/5ML
0.2500 mg | Freq: Once | INTRAVENOUS | Status: AC
  Administered 2021-12-12: 0.25 mg via INTRAVENOUS

## 2021-12-12 MED ORDER — SODIUM CHLORIDE 0.9 % IV SOLN
600.0000 mg/m2 | Freq: Once | INTRAVENOUS | Status: AC
  Administered 2021-12-12: 1160 mg via INTRAVENOUS
  Filled 2021-12-12: qty 58

## 2021-12-12 MED ORDER — PALONOSETRON HCL INJECTION 0.25 MG/5ML
INTRAVENOUS | Status: AC
  Filled 2021-12-12: qty 5

## 2021-12-12 NOTE — Patient Instructions (Addendum)
[facility]CER CTR AT [place]  Discharge Instructions: Thank you for choosing [facility] to provide your oncology and hematology care.  If you have a lab appointment with the [facility], please go directly to the [facility] and check in at the registration area.  Wear comfortable clothing and clothing appropriate for easy access to any Portacath or PICC line.   We strive to give you quality time with your provider. You may need to reschedule your appointment if you arrive late (15 or more minutes).  Arriving late affects you and other patients whose appointments are after yours.  Also, if you miss three or more appointments without notifying the office, you may be dismissed from the clinic at the provider's discretion.      For prescription refill requests, have your pharmacy contact our office and allow 72 hours for refills to be completed.    Today you received the following chemotherapy and/or immunotherapy agents Taxotere and Cytoxan        To help prevent nausea and vomiting after your treatment, we encourage you to take your nausea medication as directed.  BELOW ARE SYMPTOMS THAT SHOULD BE REPORTED IMMEDIATELY: *FEVER GREATER THAN 100.4 F (38 C) OR HIGHER *CHILLS OR SWEATING *NAUSEA AND VOMITING THAT IS NOT CONTROLLED WITH YOUR NAUSEA MEDICATION *UNUSUAL SHORTNESS OF BREATH *UNUSUAL BRUISING OR BLEEDING *URINARY PROBLEMS (pain or burning when urinating, or frequent urination) *BOWEL PROBLEMS (unusual diarrhea, constipation, pain near the anus) TENDERNESS IN MOUTH AND THROAT WITH OR WITHOUT PRESENCE OF ULCERS (sore throat, sores in mouth, or a toothache) UNUSUAL RASH, SWELLING OR PAIN  UNUSUAL VAGINAL DISCHARGE OR ITCHING   Items with * indicate a potential emergency and should be followed up as soon as possible or go to the Emergency Department if any problems should occur.  Please show the CHEMOTHERAPY ALERT CARD or IMMUNOTHERAPY ALERT CARD at  check-in to the Emergency Department and triage nurse.  Should you have questions after your visit or need to cancel or reschedule your appointment, please contact [facility]CER [place] AT [place]  [phone] and follow the prompts.  Office hours are 8:00 a.m. to 4:30 p.m. Monday - Friday. Please note that voicemails left after 4:00 p.m. may not be returned until the following business day.  We are closed weekends and major holidays. You have access to a nurse at all times for urgent questions. Please call the main number to the clinic [phone] and follow the prompts.  For any non-urgent questions, you may also contact your provider using MyChart. We now offer e-Visits for anyone 79 and older to request care online for non-urgent symptoms. For details visit [email or web address].   Also download the MyChart app! Go to the app store, search "MyChart", open the app, select [facility], and log in with your MyChart username and password.  Masks are optional in the cancer centers. If you would like for your care team to wear a mask while they are taking care of you, please let them know. For doctor visits, patients may have with them one support person who is at least 68 years old. At this time, visitors are not allowed in the infusion area.   Docetaxel Injection What is this medication? DOCETAXEL (doe se TAX el) treats some types of cancer. It works by slowing down the growth of cancer cells. This medicine may be used for other purposes; ask your health care provider or pharmacist if you have questions. COMMON BRAND NAME(S): Docefrez, Taxotere What  should I tell my care team before I take this medication? They need to know if you have any of these conditions: Kidney disease Liver disease Low white blood cell levels Tingling of the fingers or toes or other nerve disorder An unusual or allergic reaction to docetaxel, polysorbate 80, other medications, foods, dyes, or  preservatives Pregnant or trying to get pregnant Breast-feeding How should I use this medication? This medication is injected into a vein. It is given by your care team in a hospital or clinic setting. Talk to your care team about the use of this medication in children. Special care may be needed. Overdosage: If you think you have taken too much of this medicine contact a poison control center or emergency room at once. NOTE: This medicine is only for you. Do not share this medicine with others. What if I miss a dose? Keep appointments for follow-up doses. It is important not to miss your dose. Call your care team if you are unable to keep an appointment. What may interact with this medication? Do not take this medication with any of the following: Live virus vaccines This medication may also interact with the following: Certain antibiotics, such as clarithromycin, telithromycin Certain antivirals for HIV or hepatitis Certain medications for fungal infections, such as itraconazole, ketoconazole, voriconazole Grapefruit juice Nefazodone Supplements, such as St. John's wort This list may not describe all possible interactions. Give your health care provider a list of all the medicines, herbs, non-prescription drugs, or dietary supplements you use. Also tell them if you smoke, drink alcohol, or use illegal drugs. Some items may interact with your medicine. What should I watch for while using this medication? This medication may make you feel generally unwell. This is not uncommon as chemotherapy can affect healthy cells as well as cancer cells. Report any side effects. Continue your course of treatment even though you feel ill unless your care team tells you to stop. You may need blood work done while you are taking this medication. This medication can cause serious side effects and infusion reactions. To reduce the risk, your care team may give you other medications to take before receiving  this one. Be sure to follow the directions from your care team. This medication may increase your risk of getting an infection. Call your care team for advice if you get a fever, chills, sore throat, or other symptoms of a cold or flu. Do not treat yourself. Try to avoid being around people who are sick. Avoid taking medications that contain aspirin, acetaminophen, ibuprofen, naproxen, or ketoprofen unless instructed by your care team. These medications may hide a fever. Be careful brushing or flossing your teeth or using a toothpick because you may get an infection or bleed more easily. If you have any dental work done, tell your dentist you are receiving this medication. Some products may contain alcohol. Ask your care team if this medication contains alcohol. Be sure to tell all care teams you are taking this medicine. Certain medications, like metronidazole and disulfiram, can cause an unpleasant reaction when taken with alcohol. The reaction includes flushing, headache, nausea, vomiting, sweating, and increased thirst. The reaction can last from 30 minutes to several hours. This medication may affect your coordination, reaction time, or judgement. Do not drive or operate machinery until you know how this medication affects you. Sit up or stand slowly to reduce the risk of dizzy or fainting spells. Drinking alcohol with this medication can increase the risk of these side  effects. Talk to your care team about your risk of cancer. You may be more at risk for certain types of cancer if you take this medication. Talk to your care team if you wish to become pregnant or think you might be pregnant. This medication can cause serious birth defects if taken during pregnancy or if you get pregnant within 2 months after stopping therapy. A negative pregnancy test is required before starting this medication. A reliable form of contraception is recommended while taking this medication and for 2 months after stopping  it. Talk to your care team about reliable forms of contraception. Do not breast-feed while taking this medication and for 1 week after stopping therapy. Use a condom during sex and for 4 months after stopping therapy. Tell your care team right away if you think your partner might be pregnant. This medication can cause serious birth defects. This medication may cause infertility. Talk to your care team if you are concerned about your fertility. What side effects may I notice from receiving this medication? Side effects that you should report to your care team as soon as possible: Allergic reactions--skin rash, itching, hives, swelling of the face, lips, tongue, or throat Change in vision such as blurry vision, seeing halos around lights, vision loss Infection--fever, chills, cough, or sore throat Infusion reactions--chest pain, shortness of breath or trouble breathing, feeling faint or lightheaded Low red blood cell level--unusual weakness or fatigue, dizziness, headache, trouble breathing Pain, tingling, or numbness in the hands or feet Painful swelling, warmth, or redness of the skin, blisters or sores at the infusion site Redness, blistering, peeling, or loosening of the skin, including inside the mouth Sudden or severe stomach pain, bloody diarrhea, fever, nausea, vomiting Swelling of the ankles, hands, or feet Tumor lysis syndrome (TLS)--nausea, vomiting, diarrhea, decrease in the amount of urine, dark urine, unusual weakness or fatigue, confusion, muscle pain or cramps, fast or irregular heartbeat, joint pain Unusual bruising or bleeding Side effects that usually do not require medical attention (report to your care team if they continue or are bothersome): Change in nail shape, thickness, or color Change in taste Hair loss Increased tears This list may not describe all possible side effects. Call your doctor for medical advice about side effects. You may report side effects to FDA at  1-800-FDA-1088. Where should I keep my medication? This medication is given in a hospital or clinic. It will not be stored at home. NOTE: This sheet is a summary. It may not cover all possible information. If you have questions about this medicine, talk to your doctor, pharmacist, or health care provider.  2023 Elsevier/Gold Standard (2021-06-22 00:00:00)    Cyclophosphamide Injection What is this medication? CYCLOPHOSPHAMIDE (sye kloe FOSS fa mide) treats some types of cancer. It works by slowing down the growth of cancer cells. This medicine may be used for other purposes; ask your health care provider or pharmacist if you have questions. COMMON BRAND NAME(S): Cyclophosphamide, Cytoxan, Neosar What should I tell my care team before I take this medication? They need to know if you have any of these conditions: Heart disease Irregular heartbeat or rhythm Infection Kidney problems Liver disease Low blood cell levels (white cells, platelets, or red blood cells) Lung disease Previous radiation Trouble passing urine An unusual or allergic reaction to cyclophosphamide, other medications, foods, dyes, or preservatives Pregnant or trying to get pregnant Breast-feeding How should I use this medication? This medication is injected into a vein. It is given by your  care team in a hospital or clinic setting. Talk to your care team about the use of this medication in children. Special care may be needed. Overdosage: If you think you have taken too much of this medicine contact a poison control center or emergency room at once. NOTE: This medicine is only for you. Do not share this medicine with others. What if I miss a dose? Keep appointments for follow-up doses. It is important not to miss your dose. Call your care team if you are unable to keep an appointment. What may interact with this medication? Amphotericin B Amiodarone Azathioprine Certain antivirals for HIV or hepatitis Certain  medications for blood pressure, such as enalapril, lisinopril, quinapril Cyclosporine Diuretics Etanercept Indomethacin Medications that relax muscles Metronidazole Natalizumab Tamoxifen Warfarin This list may not describe all possible interactions. Give your health care provider a list of all the medicines, herbs, non-prescription drugs, or dietary supplements you use. Also tell them if you smoke, drink alcohol, or use illegal drugs. Some items may interact with your medicine. What should I watch for while using this medication? This medication may make you feel generally unwell. This is not uncommon as chemotherapy can affect healthy cells as well as cancer cells. Report any side effects. Continue your course of treatment even though you feel ill unless your care team tells you to stop. You may need blood work while you are taking this medication. This medication may increase your risk of getting an infection. Call your care team for advice if you get a fever, chills, sore throat, or other symptoms of a cold or flu. Do not treat yourself. Try to avoid being around people who are sick. Avoid taking medications that contain aspirin, acetaminophen, ibuprofen, naproxen, or ketoprofen unless instructed by your care team. These medications may hide a fever. Be careful brushing or flossing your teeth or using a toothpick because you may get an infection or bleed more easily. If you have any dental work done, tell your dentist you are receiving this medication. Drink water or other fluids as directed. Urinate often, even at night. Some products may contain alcohol. Ask your care team if this medication contains alcohol. Be sure to tell all care teams you are taking this medicine. Certain medicines, like metronidazole and disulfiram, can cause an unpleasant reaction when taken with alcohol. The reaction includes flushing, headache, nausea, vomiting, sweating, and increased thirst. The reaction can last  from 30 minutes to several hours. Talk to your care team if you wish to become pregnant or think you might be pregnant. This medication can cause serious birth defects if taken during pregnancy and for 1 year after the last dose. A negative pregnancy test is required before starting this medication. A reliable form of contraception is recommended while taking this medication and for 1 year after the last dose. Talk to your care team about reliable forms of contraception. Do not father a child while taking this medication and for 4 months after the last dose. Use a condom during this time period. Do not breast-feed while taking this medication or for 1 week after the last dose. This medication may cause infertility. Talk to your care team if you are concerned about your fertility. Talk to your care team about your risk of cancer. You may be more at risk for certain types of cancer if you take this medication. What side effects may I notice from receiving this medication? Side effects that you should report to your care team as  soon as possible: Allergic reactions--skin rash, itching, hives, swelling of the face, lips, tongue, or throat Dry cough, shortness of breath or trouble breathing Heart failure--shortness of breath, swelling of the ankles, feet, or hands, sudden weight gain, unusual weakness or fatigue Heart muscle inflammation--unusual weakness or fatigue, shortness of breath, chest pain, fast or irregular heartbeat, dizziness, swelling of the ankles, feet, or hands Heart rhythm changes--fast or irregular heartbeat, dizziness, feeling faint or lightheaded, chest pain, trouble breathing Infection--fever, chills, cough, sore throat, wounds that don't heal, pain or trouble when passing urine, general feeling of discomfort or being unwell Kidney injury--decrease in the amount of urine, swelling of the ankles, hands, or feet Liver injury--right upper belly pain, loss of appetite, nausea, light-colored  stool, dark yellow or brown urine, yellowing skin or eyes, unusual weakness or fatigue Low red blood cell level--unusual weakness or fatigue, dizziness, headache, trouble breathing Low sodium level--muscle weakness, fatigue, dizziness, headache, confusion Red or dark brown urine Unusual bruising or bleeding Side effects that usually do not require medical attention (report to your care team if they continue or are bothersome): Hair loss Irregular menstrual cycles or spotting Loss of appetite Nausea Pain, redness, or swelling with sores inside the mouth or throat Vomiting This list may not describe all possible side effects. Call your doctor for medical advice about side effects. You may report side effects to FDA at 1-800-FDA-1088. Where should I keep my medication? This medication is given in a hospital or clinic. It will not be stored at home. NOTE: This sheet is a summary. It may not cover all possible information. If you have questions about this medicine, talk to your doctor, pharmacist, or health care provider.  2023 Elsevier/Gold Standard (2021-08-01 00:00:00)

## 2021-12-12 NOTE — Progress Notes (Signed)
WBC 13.9, ANC 11.8 Pt currently on course of antibiotics.  Per Nelwyn Salisbury PA okay to proceed with treatment as scheduled today and udenyca on 12/14/21.

## 2021-12-12 NOTE — Progress Notes (Signed)
Hematology/Oncology Consult note [facility]  [phone] [phone]  Patient Care Team: Tower, Wynelle Fanny, MD as PCP - General Daiva Huge, RN as Oncology Nurse Navigator   Name of the patient: Megan Harrison  [id number]  23-May-1955   Date of visit: 12/12/21  Diagnosis-pathological prognostic stage Ia invasive mammary carcinoma of the left breast pT1b N0 M0 ER/PR HER2 negative  Chief complaint/ Reason for visit-discuss final pathology results and further management  Heme/Onc history: Patient is a 66 year old female who underwent a screening bilateral mammogram in May 2023 which showed a possible asymmetry in the left breast.  This was followed by diagnostic mammogram and ultrasound which showed a hypoechoic mass in the left breast 3 x 4 x 6 mm.  No abnormality noted in the left axillary lymph nodes.  This was biopsied and was consistent with invasive mammary carcinoma 4 mm grade 3 ER negative less than 1%, PR negative less than 1% and HER2 negative.  Final pathology showed 8 mm grade 3 triple negative tumor with negative margins.3 sentinel lymph nodes negative for malignancy.  Interval history- Patient presents today with her husband. She reports that she is nervous to get her first chemotherapy treatment completed. She recently had her port inserted on 08/02 and has recovered well from this. She also has been followed by her surgeon for some redness and irritation from her lumpectomy site- secondary to knot of sutures per surgery. She was started on Keflex 2 days ago as precautionary. No fevers, purulent discharge. She denies any fevers, night sweats, dysuria, cough.   ECOG PS- 0 Pain scale- 0   Review of systems- Review of Systems  Constitutional:  Negative for chills, fever, malaise/fatigue and weight loss.  HENT:  Negative for congestion, ear discharge and nosebleeds.   Eyes:  Negative for blurred vision.  Respiratory:  Negative for  cough, hemoptysis, sputum production, shortness of breath and wheezing.   Cardiovascular:  Negative for chest pain, palpitations, orthopnea and claudication.  Gastrointestinal:  Negative for abdominal pain, blood in stool, constipation, diarrhea, heartburn, melena, nausea and vomiting.  Genitourinary:  Negative for dysuria, flank pain, frequency, hematuria and urgency.  Musculoskeletal:  Negative for back pain, joint pain and myalgias.  Skin:  Negative for rash.  Neurological:  Negative for dizziness, tingling, focal weakness, seizures, weakness and headaches.  Endo/Heme/Allergies:  Does not bruise/bleed easily.  Psychiatric/Behavioral:  Negative for depression and suicidal ideas. The patient does not have insomnia.       Allergies  Allergen Reactions   Codeine     REACTION: syncope   Sumatriptan     REACTION: choking sensation     Past Medical History:  Diagnosis Date   Anemia    Cutaneous sarcoidosis    Degenerative disc disease    Diverticulosis    Dysplastic nevus 11/19/2017   L mid back 6.0 cm lat to spine   Foot pain    GERD (gastroesophageal reflux disease)    Hyperlipidemia    Malignant neoplasm of upper-outer quadrant of left breast in female, estrogen receptor negative ([place]) 11/04/2021   Migraine    Shingles    eyes     Past Surgical History:  Procedure Laterality Date   2D Echo  1999   Negative   ABDOMINAL HYSTERECTOMY  03/1998   APPENDECTOMY     Arm lesion, cutaneous sarcoid  12/2005   with normal CXR and ANA   BREAST BIOPSY Left 10/23/2021   US biopsy/ heart  clip/ [facility]   BREAST LUMPECTOMY WITH RADIOFREQUENCY TAG IDENTIFICATION Left 26/41/5830   lt br rf tag placement   BREAST LUMPECTOMY,RADIO FREQ LOCALIZER,AXILLARY SENTINEL LYMPH NODE BIOPSY Left 11/20/2021   Procedure: BREAST LUMPECTOMY,RADIO FREQ LOCALIZER,AXILLARY SENTINEL LYMPH NODE BIOPSY;  Surgeon: Olean Ree, MD;  Location: [facility];  Service: General;  Laterality: Left;   CESAREAN SECTION      x2   COLONOSCOPY  11/0/2009,08/18/13   diverticulosis and hemorrhoids   DIAGNOSTIC LAPAROSCOPY     EYE SURGERY  10/13/2018   had shingles in left eye, had to have surgery, has contact lens in now.   FOOT NEUROMA SURGERY Left    LAPAROSCOPIC APPENDECTOMY N/A 02/18/2016   Procedure: APPENDECTOMY LAPAROSCOPIC;  Surgeon: Ralene Ok, MD;  Location: [facility];  Service: General;  Laterality: N/A;   LUMBAR LAMINECTOMY  10/2017   PORTACATH PLACEMENT Right 12/05/2021   Procedure: INSERTION PORT-A-CATH;  Surgeon: Olean Ree, MD;  Location: [facility];  Service: General;  Laterality: Right;   ROTATOR CUFF REPAIR Left    TONSILLECTOMY     UPPER GASTROINTESTINAL ENDOSCOPY  11/15/2010   non-erosive gastritis, H. pylori negative    Social History   Socioeconomic History   Marital status: Married    Spouse name: Not on file   Number of children: Not on file   Years of education: Not on file   Highest education level: Not on file  Occupational History   Occupation: Works for [employer]    Employer: RETIRED  Tobacco Use   Smoking status: Never   Smokeless tobacco: Never  Vaping Use   Vaping Use: Never used  Substance and Sexual Activity   Alcohol use: No    Alcohol/week: 0.0 standard drinks of alcohol   Drug use: No   Sexual activity: Yes  Other Topics Concern   Not on file  Social History Narrative   Regular exercise:  Curves, elliptical, walking.   Social Determinants of Health   Financial Resource Strain: Low Risk  (02/01/2020)   Overall Financial Resource Strain (CARDIA)    Difficulty of Paying Living Expenses: Not hard at all  Food Insecurity: No Food Insecurity (02/01/2020)   Hunger Vital Sign    Worried About Running Out of Food in the Last Year: Never true    Ran Out of Food in the Last Year: Never true  Transportation Needs: No Transportation Needs (02/01/2020)   PRAPARE - Hydrologist (Medical): No    Lack of Transportation  (Non-Medical): No  Physical Activity: Insufficiently Active (02/01/2020)   Exercise Vital Sign    Days of Exercise per Week: 4 days    Minutes of Exercise per Session: 30 min  Stress: No Stress Concern Present (02/01/2020)   [place]    Feeling of Stress : Not at all  Social Connections: Not on file  Intimate Partner Violence: Not At Risk (02/01/2020)   Humiliation, Afraid, Rape, and Kick questionnaire    Fear of Current or Ex-Partner: No    Emotionally Abused: No    Physically Abused: No    Sexually Abused: No    Family History  Problem Relation Age of Onset   Colon cancer Brother        early 72's   Stomach cancer Brother    Heart disease Mother        CAD   Alcohol abuse Mother    Lung cancer Father    Heart disease  Brother        CAD   Throat cancer Brother    Heart disease Brother        CAD, pre diabetic, obese   Colon polyps Sister    Breast cancer Neg Hx    Rectal cancer Neg Hx      Current Outpatient Medications:    acetaminophen (TYLENOL) 500 MG tablet, Take 2 tablets (1,000 mg total) by mouth every 6 (six) hours as needed for mild pain., Disp: , Rfl:    aspirin 81 MG tablet, Take 81 mg by mouth daily. , Disp: , Rfl:    atorvastatin (LIPITOR) 10 MG tablet, Take 1 tablet (10 mg total) by mouth daily. (Patient taking differently: Take 10 mg by mouth at bedtime.), Disp: 90 tablet, Rfl: 3   calcium carbonate (TUMS - DOSED IN MG ELEMENTAL CALCIUM) 500 MG chewable tablet, Chew 1 tablet by mouth as needed for indigestion or heartburn., Disp: , Rfl:    cephALEXin (KEFLEX) 500 MG capsule, Take 1 capsule (500 mg total) by mouth 2 (two) times daily for 7 days., Disp: 14 capsule, Rfl: 0   Cholecalciferol (VITAMIN D3) 50 MCG (2000 UT) TABS, Take 1 tablet by mouth daily., Disp: , Rfl:    dexamethasone (DECADRON) 4 MG tablet, Take 2 tablets (8 mg total) by mouth 2 (two) times daily. Start the day before Taxotere.  Then again the day after chemo for 3 days., Disp: 30 tablet, Rfl: 1   ferrous sulfate 325 (65 FE) MG tablet, Take 325 mg by mouth daily., Disp: , Rfl:    fluticasone (FLONASE) 50 MCG/ACT nasal spray, USE ONE SPRAY(S) IN EACH NOSTRIL ONCE DAILY AS DIRECTED (Patient taking differently: 1 spray at bedtime. USE ONE SPRAY(S) IN EACH NOSTRIL ONCE DAILY AS DIRECTED), Disp: 48 g, Rfl: 3   gabapentin (NEURONTIN) 300 MG capsule, Take 300 mg by mouth daily after supper., Disp: , Rfl:    lidocaine-prilocaine (EMLA) cream, Apply to affected area once, Disp: 30 g, Rfl: 3   Magnesium Hydroxide (DULCOLAX PO), Take 1 tablet by mouth 2 (two) times daily., Disp: , Rfl:    meloxicam (MOBIC) 15 MG tablet, Take 1 tablet (15 mg total) by mouth daily as needed for pain. (Patient taking differently: Take 15 mg by mouth daily.), Disp: 90 tablet, Rfl: 3   Multiple Vitamins-Minerals (ONE-A-DAY WOMENS 50 PLUS PO), Take 1 tablet by mouth daily., Disp: , Rfl:    prednisoLONE acetate (PRED FORTE) 1 % ophthalmic suspension, Place 1 drop into the left eye every other day., Disp: , Rfl:    Timolol Maleate 0.5 % (DAILY) SOLN, Place 1 drop into the left eye daily at 6 (six) AM., Disp: , Rfl:    LORazepam (ATIVAN) 0.5 MG tablet, Take 1 tablet (0.5 mg total) by mouth every 6 (six) hours as needed (Nausea or vomiting). (Patient not taking: Reported on 12/12/2021), Disp: 30 tablet, Rfl: 0   ondansetron (ZOFRAN) 8 MG tablet, Take 1 tablet (8 mg total) by mouth 2 (two) times daily as needed for refractory nausea / vomiting. Start on day 3 after chemo. (Patient not taking: Reported on 12/12/2021), Disp: 30 tablet, Rfl: 1   prochlorperazine (COMPAZINE) 10 MG tablet, Take 1 tablet (10 mg total) by mouth every 6 (six) hours as needed (Nausea or vomiting). (Patient not taking: Reported on 12/12/2021), Disp: 30 tablet, Rfl: 1 No current facility-administered medications for this visit.  Facility-Administered Medications Ordered in Other Visits:    0.9 %   sodium chloride  infusion, , Intravenous, Once, Sindy Guadeloupe, MD   cyclophosphamide (CYTOXAN) 1,160 mg in sodium chloride 0.9 % 250 mL chemo infusion, 600 mg/m2 (Treatment Plan Recorded), Intravenous, Once, Sindy Guadeloupe, MD   dexamethasone (DECADRON) 10 mg in sodium chloride 0.9 % 50 mL IVPB, 10 mg, Intravenous, Once, Sindy Guadeloupe, MD   DOCEtaxel (TAXOTERE) 140 mg in sodium chloride 0.9 % 250 mL chemo infusion, 75 mg/m2 (Treatment Plan Recorded), Intravenous, Once, Sindy Guadeloupe, MD   heparin lock flush 100 unit/mL, 500 Units, Intracatheter, Once PRN, Sindy Guadeloupe, MD   palonosetron (ALOXI) injection 0.25 mg, 0.25 mg, Intravenous, Once, Sindy Guadeloupe, MD  Physical exam:  Vitals:   12/12/21 0939  BP: (!) 146/77  Pulse: 66  Resp: 16  Temp: 98.8 F (37.1 C)  TempSrc: Tympanic  SpO2: 96%  Weight: 181 lb 14.4 oz (82.5 kg)   Physical Exam Constitutional:      General: She is not in acute distress. Cardiovascular:     Rate and Rhythm: Normal rate and regular rhythm.     Heart sounds: Normal heart sounds.  Pulmonary:     Effort: Pulmonary effort is normal.     Breath sounds: Normal breath sounds.  Abdominal:     General: Bowel sounds are normal.     Palpations: Abdomen is soft.  Skin:    General: Skin is warm and dry.     Comments: Resolving scant erythema that appears inflammatory in nature of the left lateral breast lumpectomy site. No discharge, induration, fluctuance.   Neurological:     Mental Status: She is alert and oriented to person, place, and time.         Latest Ref Rng & Units 12/12/2021    9:07 AM  CMP  Glucose 70 - 99 mg/dL 150   BUN 8 - 23 mg/dL 23   Creatinine 0.44 - 1.00 mg/dL 0.65   Sodium 135 - 145 mmol/L 140   Potassium 3.5 - 5.1 mmol/L 3.9   Chloride 98 - 111 mmol/L 106   CO2 22 - 32 mmol/L 24   Calcium 8.9 - 10.3 mg/dL 9.8   Total Protein 6.5 - 8.1 g/dL 7.8   Total Bilirubin 0.3 - 1.2 mg/dL 0.3   Alkaline Phos 38 - 126 U/L 81   AST 15 - 41  U/L 26   ALT 0 - 44 U/L 17       Latest Ref Rng & Units 12/12/2021    9:07 AM  CBC  WBC 4.0 - 10.5 K/uL 13.9   Hemoglobin 12.0 - 15.0 g/dL 12.3   Hematocrit 36.0 - 46.0 % 37.2   Platelets 150 - 400 K/uL 360       DG Chest Port 1 View  Result Date: 12/05/2021 CLINICAL DATA:  Placement of chest port EXAM: PORTABLE CHEST 1 VIEW COMPARISON:  08/01/2020 FINDINGS: There is placement of right subclavian chest port with its tip in superior vena cava. There is poor inspiration. Transverse diameter of Elnoria Howard is increased. There are no signs of alveolar pulmonary edema or focal pulmonary consolidation. Small linear density in the lateral left lower lung field may suggest scarring. There is no pleural effusion or pneumothorax. IMPRESSION: Tip of chest port is seen in superior vena cava. There is no pneumothorax. Electronically Signed   By: Elmer Picker M.D.   On: 12/05/2021 12:53   DG C-Arm 1-60 Min-No Report  Result Date: 12/05/2021 Fluoroscopy was utilized by the requesting physician.  No radiographic  interpretation.   MM Breast Surgical Specimen  Result Date: 11/20/2021 CLINICAL DATA:  Status post RF ID tag localized LEFT breast lumpectomy. Heart clip at site invasive mammary carcinoma. EXAM: SPECIMEN RADIOGRAPH OF THE LEFT BREAST COMPARISON:  Previous exam(s). FINDINGS: Status post excision of the LEFT breast. The RF ID tag and heart shaped clip are present within the specimen. IMPRESSION: Specimen radiograph of the LEFT breast. Electronically Signed   By: Valentino Saxon M.D.   On: 11/20/2021 13:48  NM Sentinel Node Inj-No Rpt (Breast)  Result Date: 11/20/2021 Sulfur Colloid was injected by the Nuclear Medicine Technologist for sentinel lymph node localization.   MM LT RADIO FREQUENCY TAG LOC MAMMO GUIDE  Result Date: 11/13/2021 CLINICAL DATA:  65 year old female presenting for localization of the left breast prior to lumpectomy. Patient has a recent diagnosis of left breast invasive  mammary carcinoma EXAM: MAMMOGRAPHIC GUIDED RADIOFREQUENCY DEVICE LOCALIZATION OF THE LEFT BREAST COMPARISON:  Previous exam(s). FINDINGS: Patient presents for radiofrequency device localization prior to left breast lumpectomy. I met with the patient and we discussed the procedure of radiofrequency device localization including benefits and alternatives. We discussed the high likelihood of a successful procedure. We discussed the risks of the procedure including infection, bleeding, tissue injury and further surgery. Informed, written consent was given. The usual time-out protocol was performed immediately prior to the procedure. Using mammographic guidance, sterile technique, 1% lidocaine as local anesthesia, a radiofrequency tag was used to localize the heart shaped clip in the upper outer left breast using a lateral approach. The follow-up mammogram images confirm that the RF device is in the expected location and are marked for Dr. Hampton Abbot Follow-up survey of the patient confirms the presence of the RF device The patient tolerated the procedure well and was released from the [place]. IMPRESSION: Radiofrequency device localization of the LEFT breast. No apparent complications. Electronically Signed   By: Audie Pinto M.D.   On: 11/13/2021 16:27    Assessment and plan- Patient is a 66 y.o. female with stage I triple negative left breast cancer pT1b N0 M0 here to discuss further management  Discussed results of final pathology which showed an 8 mm grade 3 triple negative breast cancer with negative margins.  3 sentinel lymph nodes negative for malignancy.  Given that she has triple negative breast cancer that is more than 5 mm in size I would recommend adjuvant chemotherapy for her.  The tumor less than 1 cm in size I would recommend unknown anthracycline regimen for her.  Labs reviewed and acceptable for treatment of Taxotere along with Cytoxan given IV every 3 weeks for 4 cycles. Slight  elevation in WBC count likely secondary to stress and resolving inflammation- no fever or purulent discharge to suggest infection. I think it is reasonable to continue her Keflex as a precaution.   Reviewed common potential side effects from chemotherapy. I will bring her in next week for fluids, labs and personal recheck to ensure she is doing well. Per Dr. Janese Banks " Following completion of adjuvant chemotherapy patient will go on to receive adjuvant radiation therapy and I will refer her to radiation oncology down the line. There would be no role for endocrine therapy and triple negative disease.".   PLAN: Cycle 1 of Docetaxel + Cytoxan  RTC 1 week APP, Labs (CBC, BMP), IVF RTC 3 weeks MD, Labs (CBC, CMP) +- Cycle 2 Docetaxel + Cytoxan    Cancer Staging  Malignant neoplasm of upper-outer quadrant of left breast in female,  estrogen receptor negative ([place]) Staging form: Breast, AJCC 8th Edition - Clinical stage from 11/04/2021: Stage IB (cT1b, cN0, cM0, G3, ER-, PR-, HER2-) - Signed by Sindy Guadeloupe, MD on 11/04/2021 Histologic grading system: 3 grade system - Pathologic stage from 11/30/2021: Stage IB (pT1b, pN0(sn), cM0, G3, ER-, PR-, HER2-) - Signed by Sindy Guadeloupe, MD on 11/30/2021 Method of lymph node assessment: Sentinel lymph node biopsy Multigene prognostic tests performed: None Histologic grading system: 3 grade system     Visit Diagnosis 1. Malignant neoplasm of upper-outer quadrant of left breast in female, estrogen receptor negative ([place])   2. Encounter for antineoplastic chemotherapy   3. Skin irritation      Nelwyn Salisbury PA-C [place] [facility] 12/12/2021 10:08 AM

## 2021-12-12 NOTE — Progress Notes (Signed)
Pt in for follow up and first treatment today.  Pt is anxious and nervous about first chemotherapy treatment.

## 2021-12-13 ENCOUNTER — Telehealth: Payer: Self-pay

## 2021-12-13 NOTE — Telephone Encounter (Signed)
Telephone call to patient for follow up after receiving first infusion.   Patient states infusion went great.  States eating good and drinking plenty of fluids.   Denies any nausea or vomiting.  Encouraged patient to call for any concerns or questions. 

## 2021-12-14 ENCOUNTER — Inpatient Hospital Stay: Payer: Medicare PPO

## 2021-12-14 DIAGNOSIS — C50412 Malignant neoplasm of upper-outer quadrant of left female breast: Secondary | ICD-10-CM | POA: Diagnosis not present

## 2021-12-14 DIAGNOSIS — Z171 Estrogen receptor negative status [ER-]: Secondary | ICD-10-CM | POA: Diagnosis not present

## 2021-12-14 DIAGNOSIS — Z79899 Other long term (current) drug therapy: Secondary | ICD-10-CM | POA: Diagnosis not present

## 2021-12-14 DIAGNOSIS — E860 Dehydration: Secondary | ICD-10-CM | POA: Diagnosis not present

## 2021-12-14 DIAGNOSIS — Z5189 Encounter for other specified aftercare: Secondary | ICD-10-CM | POA: Diagnosis not present

## 2021-12-14 DIAGNOSIS — R5383 Other fatigue: Secondary | ICD-10-CM | POA: Diagnosis not present

## 2021-12-14 DIAGNOSIS — R519 Headache, unspecified: Secondary | ICD-10-CM | POA: Diagnosis not present

## 2021-12-14 DIAGNOSIS — Z5111 Encounter for antineoplastic chemotherapy: Secondary | ICD-10-CM | POA: Diagnosis not present

## 2021-12-14 MED ORDER — PEGFILGRASTIM-CBQV 6 MG/0.6ML ~~LOC~~ SOSY
6.0000 mg | PREFILLED_SYRINGE | Freq: Once | SUBCUTANEOUS | Status: AC
  Administered 2021-12-14: 6 mg via SUBCUTANEOUS
  Filled 2021-12-14: qty 0.6

## 2021-12-19 ENCOUNTER — Inpatient Hospital Stay: Payer: Medicare PPO | Admitting: Medical Oncology

## 2021-12-19 ENCOUNTER — Encounter: Payer: Medicare PPO | Admitting: Surgery

## 2021-12-19 ENCOUNTER — Inpatient Hospital Stay: Payer: Medicare PPO

## 2021-12-19 ENCOUNTER — Encounter: Payer: Self-pay | Admitting: Medical Oncology

## 2021-12-19 VITALS — BP 107/72 | HR 85 | Temp 99.0°F | Resp 17 | Wt 177.8 lb

## 2021-12-19 DIAGNOSIS — R5383 Other fatigue: Secondary | ICD-10-CM | POA: Diagnosis not present

## 2021-12-19 DIAGNOSIS — Z5111 Encounter for antineoplastic chemotherapy: Secondary | ICD-10-CM

## 2021-12-19 DIAGNOSIS — E860 Dehydration: Secondary | ICD-10-CM

## 2021-12-19 DIAGNOSIS — Z79899 Other long term (current) drug therapy: Secondary | ICD-10-CM | POA: Diagnosis not present

## 2021-12-19 DIAGNOSIS — C50412 Malignant neoplasm of upper-outer quadrant of left female breast: Secondary | ICD-10-CM

## 2021-12-19 DIAGNOSIS — Z5189 Encounter for other specified aftercare: Secondary | ICD-10-CM | POA: Diagnosis not present

## 2021-12-19 DIAGNOSIS — Z171 Estrogen receptor negative status [ER-]: Secondary | ICD-10-CM

## 2021-12-19 DIAGNOSIS — R519 Headache, unspecified: Secondary | ICD-10-CM | POA: Diagnosis not present

## 2021-12-19 LAB — CBC WITH DIFFERENTIAL/PLATELET
Abs Immature Granulocytes: 1.63 10*3/uL — ABNORMAL HIGH (ref 0.00–0.07)
Basophils Absolute: 0 10*3/uL (ref 0.0–0.1)
Basophils Relative: 1 %
Eosinophils Absolute: 0.2 10*3/uL (ref 0.0–0.5)
Eosinophils Relative: 2 %
HCT: 37.2 % (ref 36.0–46.0)
Hemoglobin: 12.3 g/dL (ref 12.0–15.0)
Immature Granulocytes: 25 %
Lymphocytes Relative: 29 %
Lymphs Abs: 1.9 10*3/uL (ref 0.7–4.0)
MCH: 30.3 pg (ref 26.0–34.0)
MCHC: 33.1 g/dL (ref 30.0–36.0)
MCV: 91.6 fL (ref 80.0–100.0)
Monocytes Absolute: 2.1 10*3/uL — ABNORMAL HIGH (ref 0.1–1.0)
Monocytes Relative: 31 %
Neutro Abs: 0.8 10*3/uL — ABNORMAL LOW (ref 1.7–7.7)
Neutrophils Relative %: 12 %
Platelets: 236 10*3/uL (ref 150–400)
RBC: 4.06 MIL/uL (ref 3.87–5.11)
RDW: 12.8 % (ref 11.5–15.5)
Smear Review: NORMAL
WBC: 6.6 10*3/uL (ref 4.0–10.5)
nRBC: 0 % (ref 0.0–0.2)

## 2021-12-19 LAB — BASIC METABOLIC PANEL
Anion gap: 9 (ref 5–15)
BUN: 21 mg/dL (ref 8–23)
CO2: 23 mmol/L (ref 22–32)
Calcium: 8.7 mg/dL — ABNORMAL LOW (ref 8.9–10.3)
Chloride: 103 mmol/L (ref 98–111)
Creatinine, Ser: 0.76 mg/dL (ref 0.44–1.00)
GFR, Estimated: >60 mL/min (ref >60)
Glucose, Bld: 121 mg/dL — ABNORMAL HIGH (ref 70–99)
Potassium: 3.8 mmol/L (ref 3.5–5.1)
Sodium: 135 mmol/L (ref 135–145)

## 2021-12-19 MED ORDER — HEPARIN SOD (PORK) LOCK FLUSH 100 UNIT/ML IV SOLN
500.0000 [IU] | Freq: Once | INTRAVENOUS | Status: AC
  Administered 2021-12-19: 500 [IU] via INTRAVENOUS
  Filled 2021-12-19: qty 5

## 2021-12-19 MED ORDER — SODIUM CHLORIDE 0.9% FLUSH
10.0000 mL | Freq: Once | INTRAVENOUS | Status: AC
  Administered 2021-12-19: 10 mL via INTRAVENOUS
  Filled 2021-12-19: qty 10

## 2021-12-19 MED ORDER — SODIUM CHLORIDE 0.9 % IV SOLN
Freq: Once | INTRAVENOUS | Status: AC
  Filled 2021-12-19: qty 250

## 2021-12-19 NOTE — Progress Notes (Signed)
Pt has no taste, no appetite. Had constipation for 4 days took a laxative which then caused diarrhea, bowels back to normal today. Has chronic low back pain, intermittent.

## 2021-12-19 NOTE — Progress Notes (Signed)
Hematology/Oncology Consult note [facility]  [phone] [phone]  Patient Care Team: Tower, Wynelle Fanny, MD as PCP - General Daiva Huge, RN as Oncology Nurse Navigator   Name of the patient: Megan Harrison  [id number]  [birth year]   Date of visit: 12/19/21  Diagnosis-pathological prognostic stage Ia invasive mammary carcinoma of the left breast pT1b N0 M0 ER/PR HER2 negative  Chief complaint/ Reason for visit-Assess first round of chemotherapy tolerance.   Heme/Onc history: Patient is a 66 year old female who underwent a screening bilateral mammogram in May 2023 which showed a possible asymmetry in the left breast.  This was followed by diagnostic mammogram and ultrasound which showed a hypoechoic mass in the left breast 3 x 4 x 6 mm.  No abnormality noted in the left axillary lymph nodes.  This was biopsied and was consistent with invasive mammary carcinoma 4 mm grade 3 ER negative less than 1%, PR negative less than 1% and HER2 negative.  Final pathology showed 8 mm grade 3 triple negative tumor with negative margins.3 sentinel lymph nodes negative for malignancy.  Interval history- She reports that she is feeling "ok".  Had some fatigue, constipation and diarrhea along with some anxiety and headaches following her first round of chemotherapy. GI symptoms have resolved. Still having some fatigue, and headaches. She takes Meloxicam and tylenol for her headaches. Eating and drinking ok- food does not taste good though. She has since finished her keflex for her lumpectomy area which has almost fully resolved.   ECOG PS- 0 Pain scale- 0   Review of systems- Review of Systems  Constitutional:  Negative for chills, fever, malaise/fatigue and weight loss.  HENT:  Negative for congestion, ear discharge and nosebleeds.   Eyes:  Negative for blurred vision.  Respiratory:  Negative for cough, hemoptysis, sputum production, shortness of breath and  wheezing.   Cardiovascular:  Negative for chest pain, palpitations, orthopnea and claudication.  Gastrointestinal:  Negative for abdominal pain, blood in stool, constipation, diarrhea, heartburn, melena, nausea and vomiting.  Genitourinary:  Negative for dysuria, flank pain, frequency, hematuria and urgency.  Musculoskeletal:  Negative for back pain, joint pain and myalgias.  Skin:  Negative for rash.  Neurological:  Negative for dizziness, tingling, focal weakness, seizures, weakness and headaches.  Endo/Heme/Allergies:  Does not bruise/bleed easily.  Psychiatric/Behavioral:  Negative for depression and suicidal ideas. The patient does not have insomnia.       Allergies  Allergen Reactions   Codeine     REACTION: syncope   Sumatriptan     REACTION: choking sensation     Past Medical History:  Diagnosis Date   Anemia    Cutaneous sarcoidosis    Degenerative disc disease    Diverticulosis    Dysplastic nevus 11/19/2017   L mid back 6.0 cm lat to spine   Foot pain    GERD (gastroesophageal reflux disease)    Hyperlipidemia    Malignant neoplasm of upper-outer quadrant of left breast in female, estrogen receptor negative ([place]) 11/04/2021   Migraine    Shingles    eyes     Past Surgical History:  Procedure Laterality Date   2D Echo  1999   Negative   ABDOMINAL HYSTERECTOMY  03/1998   APPENDECTOMY     Arm lesion, cutaneous sarcoid  12/2005   with normal CXR and ANA   BREAST BIOPSY Left 10/23/2021   US biopsy/ heart clip/ [facility]   BREAST LUMPECTOMY WITH RADIOFREQUENCY TAG IDENTIFICATION  Left 11/13/2021   lt br rf tag placement   BREAST LUMPECTOMY,RADIO FREQ LOCALIZER,AXILLARY SENTINEL LYMPH NODE BIOPSY Left 11/20/2021   Procedure: BREAST LUMPECTOMY,RADIO FREQ LOCALIZER,AXILLARY SENTINEL LYMPH NODE BIOPSY;  Surgeon: Olean Ree, MD;  Location: [facility];  Service: General;  Laterality: Left;   CESAREAN SECTION     x2   COLONOSCOPY  11/0/2009,08/18/13   diverticulosis and  hemorrhoids   DIAGNOSTIC LAPAROSCOPY     EYE SURGERY  10/13/2018   had shingles in left eye, had to have surgery, has contact lens in now.   FOOT NEUROMA SURGERY Left    LAPAROSCOPIC APPENDECTOMY N/A 02/18/2016   Procedure: APPENDECTOMY LAPAROSCOPIC;  Surgeon: Ralene Ok, MD;  Location: [facility];  Service: General;  Laterality: N/A;   LUMBAR LAMINECTOMY  10/2017   PORTACATH PLACEMENT Right 12/05/2021   Procedure: INSERTION PORT-A-CATH;  Surgeon: Olean Ree, MD;  Location: [facility];  Service: General;  Laterality: Right;   ROTATOR CUFF REPAIR Left    TONSILLECTOMY     UPPER GASTROINTESTINAL ENDOSCOPY  11/15/2010   non-erosive gastritis, H. pylori negative    Social History   Socioeconomic History   Marital status: Married    Spouse name: Not on file   Number of children: Not on file   Years of education: Not on file   Highest education level: Not on file  Occupational History   Occupation: Works for [employer]    Employer: RETIRED  Tobacco Use   Smoking status: Never   Smokeless tobacco: Never  Vaping Use   Vaping Use: Never used  Substance and Sexual Activity   Alcohol use: No    Alcohol/week: 0.0 standard drinks of alcohol   Drug use: No   Sexual activity: Yes  Other Topics Concern   Not on file  Social History Narrative   Regular exercise:  Curves, elliptical, walking.   Social Determinants of Health   Financial Resource Strain: Low Risk  (02/01/2020)   Overall Financial Resource Strain (CARDIA)    Difficulty of Paying Living Expenses: Not hard at all  Food Insecurity: No Food Insecurity (02/01/2020)   Hunger Vital Sign    Worried About Running Out of Food in the Last Year: Never true    Ran Out of Food in the Last Year: Never true  Transportation Needs: No Transportation Needs (02/01/2020)   PRAPARE - Hydrologist (Medical): No    Lack of Transportation (Non-Medical): No  Physical Activity: Insufficiently Active (02/01/2020)    Exercise Vital Sign    Days of Exercise per Week: 4 days    Minutes of Exercise per Session: 30 min  Stress: No Stress Concern Present (02/01/2020)   [place]    Feeling of Stress : Not at all  Social Connections: Not on file  Intimate Partner Violence: Not At Risk (02/01/2020)   Humiliation, Afraid, Rape, and Kick questionnaire    Fear of Current or Ex-Partner: No    Emotionally Abused: No    Physically Abused: No    Sexually Abused: No    Family History  Problem Relation Age of Onset   Colon cancer Brother        early 41's   Stomach cancer Brother    Heart disease Mother        CAD   Alcohol abuse Mother    Lung cancer Father    Heart disease Brother        CAD  Throat cancer Brother    Heart disease Brother        CAD, pre diabetic, obese   Colon polyps Sister    Breast cancer Neg Hx    Rectal cancer Neg Hx      Current Outpatient Medications:    acetaminophen (TYLENOL) 500 MG tablet, Take 2 tablets (1,000 mg total) by mouth every 6 (six) hours as needed for mild pain., Disp: , Rfl:    aspirin 81 MG tablet, Take 81 mg by mouth daily. , Disp: , Rfl:    atorvastatin (LIPITOR) 10 MG tablet, Take 1 tablet (10 mg total) by mouth daily. (Patient taking differently: Take 10 mg by mouth at bedtime.), Disp: 90 tablet, Rfl: 3   calcium carbonate (TUMS - DOSED IN MG ELEMENTAL CALCIUM) 500 MG chewable tablet, Chew 1 tablet by mouth as needed for indigestion or heartburn., Disp: , Rfl:    Cholecalciferol (VITAMIN D3) 50 MCG (2000 UT) TABS, Take 1 tablet by mouth daily., Disp: , Rfl:    dexamethasone (DECADRON) 4 MG tablet, Take 2 tablets (8 mg total) by mouth 2 (two) times daily. Start the day before Taxotere. Then again the day after chemo for 3 days., Disp: 30 tablet, Rfl: 1   ferrous sulfate 325 (65 FE) MG tablet, Take 325 mg by mouth daily., Disp: , Rfl:    fluticasone (FLONASE) 50 MCG/ACT nasal spray, USE ONE  SPRAY(S) IN EACH NOSTRIL ONCE DAILY AS DIRECTED (Patient taking differently: 1 spray at bedtime. USE ONE SPRAY(S) IN EACH NOSTRIL ONCE DAILY AS DIRECTED), Disp: 48 g, Rfl: 3   gabapentin (NEURONTIN) 300 MG capsule, Take 300 mg by mouth daily after supper., Disp: , Rfl:    lidocaine-prilocaine (EMLA) cream, Apply to affected area once, Disp: 30 g, Rfl: 3   LORazepam (ATIVAN) 0.5 MG tablet, Take 1 tablet (0.5 mg total) by mouth every 6 (six) hours as needed (Nausea or vomiting)., Disp: 30 tablet, Rfl: 0   Magnesium Hydroxide (DULCOLAX PO), Take 1 tablet by mouth 2 (two) times daily., Disp: , Rfl:    meloxicam (MOBIC) 15 MG tablet, Take 1 tablet (15 mg total) by mouth daily as needed for pain. (Patient taking differently: Take 15 mg by mouth daily.), Disp: 90 tablet, Rfl: 3   Multiple Vitamins-Minerals (ONE-A-DAY WOMENS 50 PLUS PO), Take 1 tablet by mouth daily., Disp: , Rfl:    prednisoLONE acetate (PRED FORTE) 1 % ophthalmic suspension, Place 1 drop into the left eye every other day., Disp: , Rfl:    Timolol Maleate 0.5 % (DAILY) SOLN, Place 1 drop into the left eye daily at 6 (six) AM., Disp: , Rfl:    ondansetron (ZOFRAN) 8 MG tablet, Take 1 tablet (8 mg total) by mouth 2 (two) times daily as needed for refractory nausea / vomiting. Start on day 3 after chemo. (Patient not taking: Reported on 12/12/2021), Disp: 30 tablet, Rfl: 1   prochlorperazine (COMPAZINE) 10 MG tablet, Take 1 tablet (10 mg total) by mouth every 6 (six) hours as needed (Nausea or vomiting). (Patient not taking: Reported on 12/12/2021), Disp: 30 tablet, Rfl: 1 No current facility-administered medications for this visit.  Facility-Administered Medications Ordered in Other Visits:    heparin lock flush 100 UNIT/ML injection, , , ,    heparin lock flush 100 unit/mL, 500 Units, Intravenous, Once, Canaan Holzer M, PA-C   palonosetron (ALOXI) 0.25 MG/5ML injection, , , ,   Physical exam:  Vitals:   12/19/21 1012  BP: 107/72  Pulse:  85  Resp: 17  Temp: 99 F (37.2 C)  TempSrc: Tympanic  Weight: 177 lb 12.8 oz (80.6 kg)   Physical Exam Constitutional:      General: She is not in acute distress. Cardiovascular:     Rate and Rhythm: Normal rate and regular rhythm.     Heart sounds: Normal heart sounds.  Pulmonary:     Effort: Pulmonary effort is normal.     Breath sounds: Normal breath sounds.  Abdominal:     General: Bowel sounds are normal.     Palpations: Abdomen is soft.  Skin:    General: Skin is warm and dry.     Comments: Resolving scant erythema that appears inflammatory in nature of the left lateral breast lumpectomy site. No discharge, induration, fluctuance.   Neurological:     Mental Status: She is alert and oriented to person, place, and time.         Latest Ref Rng & Units 12/19/2021   10:10 AM  CMP  Glucose 70 - 99 mg/dL 121   BUN 8 - 23 mg/dL 21   Creatinine 0.44 - 1.00 mg/dL 0.76   Sodium 135 - 145 mmol/L 135   Potassium 3.5 - 5.1 mmol/L 3.8   Chloride 98 - 111 mmol/L 103   CO2 22 - 32 mmol/L 23   Calcium 8.9 - 10.3 mg/dL 8.7       Latest Ref Rng & Units 12/19/2021   10:10 AM  CBC  WBC 4.0 - 10.5 K/uL 6.6   Hemoglobin 12.0 - 15.0 g/dL 12.3   Hematocrit 36.0 - 46.0 % 37.2   Platelets 150 - 400 K/uL 236       DG Chest Port 1 View  Result Date: 12/05/2021 CLINICAL DATA:  Placement of chest port EXAM: PORTABLE CHEST 1 VIEW COMPARISON:  08/01/2020 FINDINGS: There is placement of right subclavian chest port with its tip in superior vena cava. There is poor inspiration. Transverse diameter of Elnoria Howard is increased. There are no signs of alveolar pulmonary edema or focal pulmonary consolidation. Small linear density in the lateral left lower lung field may suggest scarring. There is no pleural effusion or pneumothorax. IMPRESSION: Tip of chest port is seen in superior vena cava. There is no pneumothorax. Electronically Signed   By: Elmer Picker M.D.   On: 12/05/2021 12:53   DG C-Arm  1-60 Min-No Report  Result Date: 12/05/2021 Fluoroscopy was utilized by the requesting physician.  No radiographic interpretation.   MM Breast Surgical Specimen  Result Date: 11/20/2021 CLINICAL DATA:  Status post RF ID tag localized LEFT breast lumpectomy. Heart clip at site invasive mammary carcinoma. EXAM: SPECIMEN RADIOGRAPH OF THE LEFT BREAST COMPARISON:  Previous exam(s). FINDINGS: Status post excision of the LEFT breast. The RF ID tag and heart shaped clip are present within the specimen. IMPRESSION: Specimen radiograph of the LEFT breast. Electronically Signed   By: Valentino Saxon M.D.   On: 11/20/2021 13:48  NM Sentinel Node Inj-No Rpt (Breast)  Result Date: 11/20/2021 Sulfur Colloid was injected by the Nuclear Medicine Technologist for sentinel lymph node localization.     Assessment and plan- Patient is a 67 y.o. female with stage I triple negative left breast cancer pT1b N0 M0 here to discuss further management  She is experiencing some side effects of her chemotherapy. They are being managed and have either resolved or are not significant enough for her to wish to stop treatment. IVF fluids today to see if this will help  with her fatigue and headaches. She will continue eating/drinking well. RTC 2 weeks for consideration of cycle 2.   PLAN: IVF today RTC 2 weeks MD, Labs (CBC, CMP) +- Cycle 2 Docetaxel + Cytoxan    Cancer Staging  Malignant neoplasm of upper-outer quadrant of left breast in female, estrogen receptor negative ([place]) Staging form: Breast, AJCC 8th Edition - Clinical stage from 11/04/2021: Stage IB (cT1b, cN0, cM0, G3, ER-, PR-, HER2-) - Signed by Sindy Guadeloupe, MD on 11/04/2021 Histologic grading system: 3 grade system - Pathologic stage from 11/30/2021: Stage IB (pT1b, pN0(sn), cM0, G3, ER-, PR-, HER2-) - Signed by Sindy Guadeloupe, MD on 11/30/2021 Method of lymph node assessment: Sentinel lymph node biopsy Multigene prognostic tests performed: None Histologic  grading system: 3 grade system     Visit Diagnosis 1. Malignant neoplasm of upper-outer quadrant of left breast in female, estrogen receptor negative ([place])   2. Other fatigue   3. Dehydration      Nelwyn Salisbury PA-C [place] [facility] 12/19/2021 11:41 AM

## 2021-12-21 ENCOUNTER — Encounter: Payer: Self-pay | Admitting: Surgery

## 2021-12-25 ENCOUNTER — Encounter: Payer: Self-pay | Admitting: Family Medicine

## 2022-01-02 ENCOUNTER — Encounter: Payer: Self-pay | Admitting: Oncology

## 2022-01-02 ENCOUNTER — Inpatient Hospital Stay (HOSPITAL_BASED_OUTPATIENT_CLINIC_OR_DEPARTMENT_OTHER): Payer: Medicare PPO | Admitting: Oncology

## 2022-01-02 ENCOUNTER — Inpatient Hospital Stay: Payer: Medicare PPO

## 2022-01-02 VITALS — BP 123/63 | HR 67 | Temp 97.8°F | Resp 16 | Wt 182.7 lb

## 2022-01-02 DIAGNOSIS — Z5111 Encounter for antineoplastic chemotherapy: Secondary | ICD-10-CM | POA: Diagnosis not present

## 2022-01-02 DIAGNOSIS — Z171 Estrogen receptor negative status [ER-]: Secondary | ICD-10-CM

## 2022-01-02 DIAGNOSIS — Z79899 Other long term (current) drug therapy: Secondary | ICD-10-CM | POA: Diagnosis not present

## 2022-01-02 DIAGNOSIS — E860 Dehydration: Secondary | ICD-10-CM | POA: Diagnosis not present

## 2022-01-02 DIAGNOSIS — R5383 Other fatigue: Secondary | ICD-10-CM | POA: Diagnosis not present

## 2022-01-02 DIAGNOSIS — C50412 Malignant neoplasm of upper-outer quadrant of left female breast: Secondary | ICD-10-CM

## 2022-01-02 DIAGNOSIS — R519 Headache, unspecified: Secondary | ICD-10-CM | POA: Diagnosis not present

## 2022-01-02 DIAGNOSIS — Z5189 Encounter for other specified aftercare: Secondary | ICD-10-CM | POA: Diagnosis not present

## 2022-01-02 LAB — CBC WITH DIFFERENTIAL/PLATELET
Abs Immature Granulocytes: 0.08 10*3/uL — ABNORMAL HIGH (ref 0.00–0.07)
Basophils Absolute: 0 10*3/uL (ref 0.0–0.1)
Basophils Relative: 0 %
Eosinophils Absolute: 0 10*3/uL (ref 0.0–0.5)
Eosinophils Relative: 0 %
HCT: 32.9 % — ABNORMAL LOW (ref 36.0–46.0)
Hemoglobin: 11 g/dL — ABNORMAL LOW (ref 12.0–15.0)
Immature Granulocytes: 1 %
Lymphocytes Relative: 7 %
Lymphs Abs: 0.9 10*3/uL (ref 0.7–4.0)
MCH: 30.8 pg (ref 26.0–34.0)
MCHC: 33.4 g/dL (ref 30.0–36.0)
MCV: 92.2 fL (ref 80.0–100.0)
Monocytes Absolute: 0.4 10*3/uL (ref 0.1–1.0)
Monocytes Relative: 3 %
Neutro Abs: 11.6 10*3/uL — ABNORMAL HIGH (ref 1.7–7.7)
Neutrophils Relative %: 89 %
Platelets: 504 10*3/uL — ABNORMAL HIGH (ref 150–400)
RBC: 3.57 MIL/uL — ABNORMAL LOW (ref 3.87–5.11)
RDW: 14.1 % (ref 11.5–15.5)
WBC: 13 10*3/uL — ABNORMAL HIGH (ref 4.0–10.5)
nRBC: 0 % (ref 0.0–0.2)

## 2022-01-02 LAB — COMPREHENSIVE METABOLIC PANEL
ALT: 19 U/L (ref 0–44)
AST: 30 U/L (ref 15–41)
Albumin: 3.7 g/dL (ref 3.5–5.0)
Alkaline Phosphatase: 73 U/L (ref 38–126)
Anion gap: 7 (ref 5–15)
BUN: 20 mg/dL (ref 8–23)
CO2: 22 mmol/L (ref 22–32)
Calcium: 9.3 mg/dL (ref 8.9–10.3)
Chloride: 108 mmol/L (ref 98–111)
Creatinine, Ser: 0.78 mg/dL (ref 0.44–1.00)
GFR, Estimated: >60 mL/min (ref >60)
Glucose, Bld: 190 mg/dL — ABNORMAL HIGH (ref 70–99)
Potassium: 3.8 mmol/L (ref 3.5–5.1)
Sodium: 137 mmol/L (ref 135–145)
Total Bilirubin: 0.3 mg/dL (ref 0.3–1.2)
Total Protein: 6.9 g/dL (ref 6.5–8.1)

## 2022-01-02 MED ORDER — SODIUM CHLORIDE 0.9 % IV SOLN
75.0000 mg/m2 | Freq: Once | INTRAVENOUS | Status: AC
  Administered 2022-01-02: 140 mg via INTRAVENOUS
  Filled 2022-01-02: qty 14

## 2022-01-02 MED ORDER — HEPARIN SOD (PORK) LOCK FLUSH 100 UNIT/ML IV SOLN
INTRAVENOUS | Status: AC
  Administered 2022-01-02: 500 [IU]
  Filled 2022-01-02: qty 5

## 2022-01-02 MED ORDER — SODIUM CHLORIDE 0.9% FLUSH
10.0000 mL | Freq: Once | INTRAVENOUS | Status: AC
  Administered 2022-01-02: 10 mL via INTRAVENOUS
  Filled 2022-01-02: qty 10

## 2022-01-02 MED ORDER — SODIUM CHLORIDE 0.9 % IV SOLN
Freq: Once | INTRAVENOUS | Status: AC
  Filled 2022-01-02: qty 250

## 2022-01-02 MED ORDER — PALONOSETRON HCL INJECTION 0.25 MG/5ML
0.2500 mg | Freq: Once | INTRAVENOUS | Status: AC
  Administered 2022-01-02: 0.25 mg via INTRAVENOUS
  Filled 2022-01-02: qty 5

## 2022-01-02 MED ORDER — SODIUM CHLORIDE 0.9 % IV SOLN
600.0000 mg/m2 | Freq: Once | INTRAVENOUS | Status: AC
  Administered 2022-01-02: 1140 mg via INTRAVENOUS
  Filled 2022-01-02: qty 57

## 2022-01-02 MED ORDER — SODIUM CHLORIDE 0.9 % IV SOLN
10.0000 mg | Freq: Once | INTRAVENOUS | Status: AC
  Administered 2022-01-02: 10 mg via INTRAVENOUS
  Filled 2022-01-02: qty 10

## 2022-01-02 NOTE — Patient Instructions (Signed)
[facility]  Discharge Instructions: Thank you for choosing [facility] to provide your oncology and hematology care.  If you have a lab appointment with the Cancer Center, please go directly to the Cancer Center and check in at the registration area.  Wear comfortable clothing and clothing appropriate for easy access to any Portacath or PICC line.   We strive to give you quality time with your provider. You may need to reschedule your appointment if you arrive late (15 or more minutes).  Arriving late affects you and other patients whose appointments are after yours.  Also, if you miss three or more appointments without notifying the office, you may be dismissed from the clinic at the provider's discretion.      For prescription refill requests, have your pharmacy contact our office and allow 72 hours for refills to be completed.    Today you received the following chemotherapy and/or immunotherapy agents Taxotere and Cytoxan        To help prevent nausea and vomiting after your treatment, we encourage you to take your nausea medication as directed.  BELOW ARE SYMPTOMS THAT SHOULD BE REPORTED IMMEDIATELY: *FEVER GREATER THAN 100.4 F (38 C) OR HIGHER *CHILLS OR SWEATING *NAUSEA AND VOMITING THAT IS NOT CONTROLLED WITH YOUR NAUSEA MEDICATION *UNUSUAL SHORTNESS OF BREATH *UNUSUAL BRUISING OR BLEEDING *URINARY PROBLEMS (pain or burning when urinating, or frequent urination) *BOWEL PROBLEMS (unusual diarrhea, constipation, pain near the anus) TENDERNESS IN MOUTH AND THROAT WITH OR WITHOUT PRESENCE OF ULCERS (sore throat, sores in mouth, or a toothache) UNUSUAL RASH, SWELLING OR PAIN  UNUSUAL VAGINAL DISCHARGE OR ITCHING   Items with * indicate a potential emergency and should be followed up as soon as possible or go to the Emergency Department if any problems should occur.  Please show the CHEMOTHERAPY ALERT CARD or IMMUNOTHERAPY ALERT CARD at  check-in to the Emergency Department and triage nurse.  Should you have questions after your visit or need to cancel or reschedule your appointment, please contact [facility]  [phone] and follow the prompts.  Office hours are 8:00 a.m. to 4:30 p.m. Monday - Friday. Please note that voicemails left after 4:00 p.m. may not be returned until the following business day.  We are closed weekends and major holidays. You have access to a nurse at all times for urgent questions. Please call the main number to the clinic [phone] and follow the prompts.  For any non-urgent questions, you may also contact your provider using MyChart. We now offer e-Visits for anyone 18 and older to request care online for non-urgent symptoms. For details visit [email or web address].   Also download the MyChart app! Go to the app store, search "MyChart", open the app, select [facility], and log in with your MyChart username and password.  Masks are optional in the cancer centers. If you would like for your care team to wear a mask while they are taking care of you, please let them know. For doctor visits, patients may have with them one support person who is at least 68 years old. At this time, visitors are not allowed in the infusion area.   

## 2022-01-02 NOTE — Progress Notes (Signed)
Pt has no new concerns for todays visit. 

## 2022-01-02 NOTE — Progress Notes (Signed)
ON PATHWAY REGIMEN - Breast  No Change  Continue With Treatment as Ordered.  Original Decision Date/Time: 11/30/2021 15:46     A cycle is every 21 days:     Docetaxel      Cyclophosphamide   **Always confirm dose/schedule in your pharmacy ordering system**  Patient Characteristics: Postoperative without Neoadjuvant Therapy (Pathologic Staging), Invasive Disease, Adjuvant Therapy, HER2 Negative/Unknown/Equivocal, ER Negative/Unknown, Node Negative, pT1a-b, N0, Chemotherapy Indicated Therapeutic Status: Postoperative without Neoadjuvant Therapy (Pathologic Staging) AJCC Grade: G3 AJCC N Category: pN0 AJCC M Category: cM0 ER Status: Negative (-) AJCC 8 Stage Grouping: IB HER2 Status: Negative (-) Oncotype Dx Recurrence Score: Not Appropriate AJCC T Category: pT1b PR Status: Negative (-) Adjuvant Therapy Status: No Adjuvant Therapy Received Yet or Changing Initial Adjuvant Regimen due to Tolerance Intervention Indicated: Chemotherapy Intent of Therapy: Curative Intent, Discussed with Patient

## 2022-01-02 NOTE — Progress Notes (Signed)
Hematology/Oncology Consult note [facility]  [phone] [phone]  Patient Care Team: Tower, Wynelle Fanny, MD as PCP - General Daiva Huge, RN as Oncology Nurse Navigator   Name of the patient: Megan Harrison  [id number]  December 26, 1955   Date of visit: 01/02/22  Diagnosis- pathological prognostic stage Ia invasive mammary carcinoma of the left breast pT1b N0 M0 ER/PR HER2 negative  Chief complaint/ Reason for visit-on treatment assessment prior to cycle 2 of adjuvant TC chemotherapy  Heme/Onc history: Patient is a 66 year old female who underwent a screening bilateral mammogram in May 2023 which showed a possible asymmetry in the left breast.  This was followed by diagnostic mammogram and ultrasound which showed a hypoechoic mass in the left breast 3 x 4 x 6 mm.  No abnormality noted in the left axillary lymph nodes.  This was biopsied and was consistent with invasive mammary carcinoma 4 mm grade 3 ER negative less than 1%, PR negative less than 1% and HER2 negative.   Final pathology showed 8 mm grade 3 triple negative tumor with negative margins.3 sentinel lymph nodes negative for malignancy.  Plan is for adjuvant TC chemotherapy x4 cycles followed by radiation  Interval history-patient tolerated chemotherapy well other than ongoing fatigue.  She is trying to keep herself active  ECOG PS- 1 Pain scale- 0 Opioid associated constipation- no  Review of systems- Review of Systems  Constitutional:  Positive for malaise/fatigue. Negative for chills, fever and weight loss.  HENT:  Negative for congestion, ear discharge and nosebleeds.   Eyes:  Negative for blurred vision.  Respiratory:  Negative for cough, hemoptysis, sputum production, shortness of breath and wheezing.   Cardiovascular:  Negative for chest pain, palpitations, orthopnea and claudication.  Gastrointestinal:  Negative for abdominal pain, blood in stool, constipation, diarrhea,  heartburn, melena, nausea and vomiting.  Genitourinary:  Negative for dysuria, flank pain, frequency, hematuria and urgency.  Musculoskeletal:  Negative for back pain, joint pain and myalgias.  Skin:  Negative for rash.  Neurological:  Negative for dizziness, tingling, focal weakness, seizures, weakness and headaches.  Endo/Heme/Allergies:  Does not bruise/bleed easily.  Psychiatric/Behavioral:  Negative for depression and suicidal ideas. The patient does not have insomnia.       Allergies  Allergen Reactions   Codeine     REACTION: syncope   Sumatriptan     REACTION: choking sensation     Past Medical History:  Diagnosis Date   Anemia    Cutaneous sarcoidosis    Degenerative disc disease    Diverticulosis    Dysplastic nevus 11/19/2017   L mid back 6.0 cm lat to spine   Foot pain    GERD (gastroesophageal reflux disease)    Hyperlipidemia    Malignant neoplasm of upper-outer quadrant of left breast in female, estrogen receptor negative ([place]) 11/04/2021   Migraine    Shingles    eyes     Past Surgical History:  Procedure Laterality Date   2D Echo  1999   Negative   ABDOMINAL HYSTERECTOMY  03/1998   APPENDECTOMY     Arm lesion, cutaneous sarcoid  12/2005   with normal CXR and ANA   BREAST BIOPSY Left 10/23/2021   [facility] biopsy/ heart clip/ [facility]   BREAST LUMPECTOMY WITH RADIOFREQUENCY TAG IDENTIFICATION Left 97/35/3299   lt br rf tag placement   BREAST LUMPECTOMY,RADIO FREQ LOCALIZER,AXILLARY SENTINEL LYMPH NODE BIOPSY Left 11/20/2021   Procedure: BREAST LUMPECTOMY,RADIO FREQ LOCALIZER,AXILLARY SENTINEL LYMPH NODE BIOPSY;  Surgeon: Olean Ree, MD;  Location: [facility];  Service: General;  Laterality: Left;   CESAREAN SECTION     x2   COLONOSCOPY  11/0/2009,08/18/13   diverticulosis and hemorrhoids   DIAGNOSTIC LAPAROSCOPY     EYE SURGERY  10/13/2018   had shingles in left eye, had to have surgery, has contact lens in now.   FOOT NEUROMA SURGERY Left    LAPAROSCOPIC  APPENDECTOMY N/A 02/18/2016   Procedure: APPENDECTOMY LAPAROSCOPIC;  Surgeon: Ralene Ok, MD;  Location: [place];  Service: General;  Laterality: N/A;   LUMBAR LAMINECTOMY  10/2017   PORTACATH PLACEMENT Right 12/05/2021   Procedure: INSERTION PORT-A-CATH;  Surgeon: Olean Ree, MD;  Location: [facility];  Service: General;  Laterality: Right;   ROTATOR CUFF REPAIR Left    TONSILLECTOMY     UPPER GASTROINTESTINAL ENDOSCOPY  11/15/2010   non-erosive gastritis, H. pylori negative    Social History   Socioeconomic History   Marital status: Married    Spouse name: Not on file   Number of children: Not on file   Years of education: Not on file   Highest education level: Not on file  Occupational History   Occupation: Works for [employer]    Employer: RETIRED  Tobacco Use   Smoking status: Never   Smokeless tobacco: Never  Vaping Use   Vaping Use: Never used  Substance and Sexual Activity   Alcohol use: No    Alcohol/week: 0.0 standard drinks of alcohol   Drug use: No   Sexual activity: Yes  Other Topics Concern   Not on file  Social History Narrative   Regular exercise:  Curves, elliptical, walking.   Social Determinants of Health   Financial Resource Strain: Low Risk  (02/01/2020)   Overall Financial Resource Strain (CARDIA)    Difficulty of Paying Living Expenses: Not hard at all  Food Insecurity: No Food Insecurity (02/01/2020)   Hunger Vital Sign    Worried About Running Out of Food in the Last Year: Never true    Ran Out of Food in the Last Year: Never true  Transportation Needs: No Transportation Needs (02/01/2020)   PRAPARE - Hydrologist (Medical): No    Lack of Transportation (Non-Medical): No  Physical Activity: Insufficiently Active (02/01/2020)   Exercise Vital Sign    Days of Exercise per Week: 4 days    Minutes of Exercise per Session: 30 min  Stress: No Stress Concern Present (02/01/2020)   [place]    Feeling of Stress : Not at all  Social Connections: Not on file  Intimate Partner Violence: Not At Risk (02/01/2020)   Humiliation, Afraid, Rape, and Kick questionnaire    Fear of Current or Ex-Partner: No    Emotionally Abused: No    Physically Abused: No    Sexually Abused: No    Family History  Problem Relation Age of Onset   Colon cancer Brother        early 94's   Stomach cancer Brother    Heart disease Mother        CAD   Alcohol abuse Mother    Lung cancer Father    Heart disease Brother        CAD   Throat cancer Brother    Heart disease Brother        CAD, pre diabetic, obese   Colon polyps Sister    Breast cancer Neg Hx  Rectal cancer Neg Hx      Current Outpatient Medications:    acetaminophen (TYLENOL) 500 MG tablet, Take 2 tablets (1,000 mg total) by mouth every 6 (six) hours as needed for mild pain., Disp: , Rfl:    aspirin 81 MG tablet, Take 81 mg by mouth daily. , Disp: , Rfl:    atorvastatin (LIPITOR) 10 MG tablet, Take 1 tablet (10 mg total) by mouth daily. (Patient taking differently: Take 10 mg by mouth at bedtime.), Disp: 90 tablet, Rfl: 3   calcium carbonate (TUMS - DOSED IN MG ELEMENTAL CALCIUM) 500 MG chewable tablet, Chew 1 tablet by mouth as needed for indigestion or heartburn., Disp: , Rfl:    Cholecalciferol (VITAMIN D3) 50 MCG (2000 UT) TABS, Take 1 tablet by mouth daily., Disp: , Rfl:    ferrous sulfate 325 (65 FE) MG tablet, Take 325 mg by mouth daily., Disp: , Rfl:    fluticasone (FLONASE) 50 MCG/ACT nasal spray, USE ONE SPRAY(S) IN EACH NOSTRIL ONCE DAILY AS DIRECTED (Patient taking differently: 1 spray at bedtime. USE ONE SPRAY(S) IN EACH NOSTRIL ONCE DAILY AS DIRECTED), Disp: 48 g, Rfl: 3   gabapentin (NEURONTIN) 300 MG capsule, Take 300 mg by mouth daily after supper., Disp: , Rfl:    Magnesium Hydroxide (DULCOLAX PO), Take 1 tablet by mouth 2 (two) times daily., Disp: , Rfl:    meloxicam  (MOBIC) 15 MG tablet, Take 1 tablet (15 mg total) by mouth daily as needed for pain. (Patient taking differently: Take 15 mg by mouth daily.), Disp: 90 tablet, Rfl: 3   Multiple Vitamins-Minerals (ONE-A-DAY WOMENS 50 PLUS PO), Take 1 tablet by mouth daily., Disp: , Rfl:    prednisoLONE acetate (PRED FORTE) 1 % ophthalmic suspension, Place 1 drop into the left eye every other day., Disp: , Rfl:    Timolol Maleate 0.5 % (DAILY) SOLN, Place 1 drop into the left eye daily at 6 (six) AM., Disp: , Rfl:  No current facility-administered medications for this visit.  Facility-Administered Medications Ordered in Other Visits:    heparin lock flush 100 UNIT/ML injection, , , ,    palonosetron (ALOXI) 0.25 MG/5ML injection, , , ,   Physical exam:  Vitals:   01/02/22 0859  BP: 123/63  Pulse: 67  Resp: 16  Temp: 97.8 F (36.6 C)  SpO2: 97%  Weight: 182 lb 11.2 oz (82.9 kg)   Physical Exam Cardiovascular:     Rate and Rhythm: Normal rate and regular rhythm.     Heart sounds: Normal heart sounds.  Pulmonary:     Effort: Pulmonary effort is normal.     Breath sounds: Normal breath sounds.  Abdominal:     General: Bowel sounds are normal.     Palpations: Abdomen is soft.  Skin:    General: Skin is warm and dry.  Neurological:     Mental Status: She is alert and oriented to person, place, and time.         Latest Ref Rng & Units 01/02/2022    8:40 AM  CMP  Glucose 70 - 99 mg/dL 190   BUN 8 - 23 mg/dL 20   Creatinine 0.44 - 1.00 mg/dL 0.78   Sodium 135 - 145 mmol/L 137   Potassium 3.5 - 5.1 mmol/L 3.8   Chloride 98 - 111 mmol/L 108   CO2 22 - 32 mmol/L 22   Calcium 8.9 - 10.3 mg/dL 9.3   Total Protein 6.5 - 8.1 g/dL 6.9  Total Bilirubin 0.3 - 1.2 mg/dL 0.3   Alkaline Phos 38 - 126 U/L 73   AST 15 - 41 U/L 30   ALT 0 - 44 U/L 19       Latest Ref Rng & Units 01/02/2022    8:40 AM  CBC  WBC 4.0 - 10.5 K/uL 13.0   Hemoglobin 12.0 - 15.0 g/dL 11.0   Hematocrit 36.0 - 46.0 % 32.9    Platelets 150 - 400 K/uL 504     No images are attached to the encounter.  DG Chest Port 1 View  Result Date: 12/05/2021 CLINICAL DATA:  Placement of chest port EXAM: PORTABLE CHEST 1 VIEW COMPARISON:  08/01/2020 FINDINGS: There is placement of right subclavian chest port with its tip in superior vena cava. There is poor inspiration. Transverse diameter of Elnoria Howard is increased. There are no signs of alveolar pulmonary edema or focal pulmonary consolidation. Small linear density in the lateral left lower lung field may suggest scarring. There is no pleural effusion or pneumothorax. IMPRESSION: Tip of chest port is seen in superior vena cava. There is no pneumothorax. Electronically Signed   By: Elmer Picker M.D.   On: 12/05/2021 12:53   DG C-Arm 1-60 Min-No Report  Result Date: 12/05/2021 Fluoroscopy was utilized by the requesting physician.  No radiographic interpretation.     Assessment and plan- Patient is a 66 y.o. female with stage I triple negative left breast cancer pT1b N0 M0.  She is here for on treatment assessment prior to cycle 2 of adjuvant TC chemotherapy  Counts okay to proceed with cycle 2 ofAdjuvant TC chemotherapy today with Udenyca on day 2 or day 3.  She has mild leukocytosis with a white count of 13 and a platelet count of 504 which is likely reactive and we will continue to monitor.  I will see her back in 3 weeks for cycle 3.  Chemo-induced fatigue: Encourage physical activity and exercise as tolerated   Visit Diagnosis 1. Malignant neoplasm of upper-outer quadrant of left breast in female, estrogen receptor negative ([place])   2. Encounter for antineoplastic chemotherapy      Dr. Randa Evens, MD, MPH [facility] [place] [phone] 01/02/2022 12:53 PM

## 2022-01-04 ENCOUNTER — Inpatient Hospital Stay: Payer: Medicare PPO | Attending: Oncology

## 2022-01-04 ENCOUNTER — Other Ambulatory Visit: Payer: Self-pay

## 2022-01-04 DIAGNOSIS — Z5111 Encounter for antineoplastic chemotherapy: Secondary | ICD-10-CM | POA: Insufficient documentation

## 2022-01-04 DIAGNOSIS — C50412 Malignant neoplasm of upper-outer quadrant of left female breast: Secondary | ICD-10-CM | POA: Insufficient documentation

## 2022-01-04 DIAGNOSIS — Z171 Estrogen receptor negative status [ER-]: Secondary | ICD-10-CM | POA: Diagnosis not present

## 2022-01-04 DIAGNOSIS — Z79899 Other long term (current) drug therapy: Secondary | ICD-10-CM | POA: Insufficient documentation

## 2022-01-04 DIAGNOSIS — Z5189 Encounter for other specified aftercare: Secondary | ICD-10-CM | POA: Insufficient documentation

## 2022-01-04 MED ORDER — PEGFILGRASTIM-CBQV 6 MG/0.6ML ~~LOC~~ SOSY
6.0000 mg | PREFILLED_SYRINGE | Freq: Once | SUBCUTANEOUS | Status: AC
  Administered 2022-01-04: 6 mg via SUBCUTANEOUS
  Filled 2022-01-04: qty 0.6

## 2022-01-11 ENCOUNTER — Inpatient Hospital Stay: Payer: Medicare PPO

## 2022-01-11 ENCOUNTER — Telehealth: Payer: Self-pay | Admitting: *Deleted

## 2022-01-11 DIAGNOSIS — C50412 Malignant neoplasm of upper-outer quadrant of left female breast: Secondary | ICD-10-CM | POA: Diagnosis not present

## 2022-01-11 DIAGNOSIS — Z5111 Encounter for antineoplastic chemotherapy: Secondary | ICD-10-CM | POA: Diagnosis not present

## 2022-01-11 DIAGNOSIS — Z171 Estrogen receptor negative status [ER-]: Secondary | ICD-10-CM | POA: Diagnosis not present

## 2022-01-11 DIAGNOSIS — Z5189 Encounter for other specified aftercare: Secondary | ICD-10-CM | POA: Diagnosis not present

## 2022-01-11 DIAGNOSIS — R300 Dysuria: Secondary | ICD-10-CM

## 2022-01-11 DIAGNOSIS — Z79899 Other long term (current) drug therapy: Secondary | ICD-10-CM | POA: Diagnosis not present

## 2022-01-11 LAB — URINALYSIS, COMPLETE (UACMP) WITH MICROSCOPIC
Bilirubin Urine: NEGATIVE
Glucose, UA: NEGATIVE mg/dL
Ketones, ur: NEGATIVE mg/dL
Leukocytes,Ua: NEGATIVE
Nitrite: NEGATIVE
Protein, ur: NEGATIVE mg/dL
Specific Gravity, Urine: 1.024 (ref 1.005–1.030)
pH: 5 (ref 5.0–8.0)

## 2022-01-11 MED ORDER — SULFAMETHOXAZOLE-TRIMETHOPRIM 800-160 MG PO TABS: 1.0000 | ORAL_TABLET | Freq: Two times a day (BID) | ORAL | 0 refills | Status: DC

## 2022-01-11 NOTE — Telephone Encounter (Signed)
Called the patient and urine has small amount bacteria. We will wait for the culture to see if it is a UTO but for now dr Janese Banks  said to send in bactrim and it is twice a day for 5 days and if the culture comes back and we need to change the atb we will let her know. Pt . Agreeable to the plan

## 2022-01-11 NOTE — Telephone Encounter (Signed)
Per Dr Janese Banks, patient to bring in urine for UA, C/S order entered patient notified of need for urine and that doctor will call her if she has UTI and order antibiotics at that time. She agrees to come in within the hour. Ab staff notified of add on patient.

## 2022-01-11 NOTE — Telephone Encounter (Signed)
Patient called stating that she feels she has a UTI. When questioned about symptoms, she reports that she is having burning with urination, is only going a small amount about every hour. Denies odor or discoloration, no fevers. States that she is trying to drink more liquids, but everything is giving her heartburn so she is not getting as much as she probably needs. Please advise

## 2022-01-13 ENCOUNTER — Encounter: Payer: Self-pay | Admitting: Oncology

## 2022-01-13 LAB — URINE CULTURE: Culture: 60000 — AB

## 2022-01-14 ENCOUNTER — Inpatient Hospital Stay (HOSPITAL_BASED_OUTPATIENT_CLINIC_OR_DEPARTMENT_OTHER): Payer: Medicare PPO | Admitting: Medical Oncology

## 2022-01-14 ENCOUNTER — Telehealth: Payer: Self-pay | Admitting: *Deleted

## 2022-01-14 ENCOUNTER — Ambulatory Visit
Admission: RE | Admit: 2022-01-14 | Discharge: 2022-01-14 | Disposition: A | Discharge status: Home / Self Care | Payer: Medicare PPO | Source: Ambulatory Visit | Attending: Medical Oncology | Admitting: Medical Oncology

## 2022-01-14 VITALS — BP 139/87 | HR 77 | Temp 97.0°F

## 2022-01-14 DIAGNOSIS — Z171 Estrogen receptor negative status [ER-]: Secondary | ICD-10-CM | POA: Diagnosis not present

## 2022-01-14 DIAGNOSIS — Z95828 Presence of other vascular implants and grafts: Secondary | ICD-10-CM | POA: Diagnosis not present

## 2022-01-14 DIAGNOSIS — I82611 Acute embolism and thrombosis of superficial veins of right upper extremity: Secondary | ICD-10-CM | POA: Diagnosis not present

## 2022-01-14 DIAGNOSIS — Z5189 Encounter for other specified aftercare: Secondary | ICD-10-CM | POA: Diagnosis not present

## 2022-01-14 DIAGNOSIS — I82B11 Acute embolism and thrombosis of right subclavian vein: Secondary | ICD-10-CM | POA: Diagnosis not present

## 2022-01-14 DIAGNOSIS — Z5111 Encounter for antineoplastic chemotherapy: Secondary | ICD-10-CM | POA: Diagnosis not present

## 2022-01-14 DIAGNOSIS — C50412 Malignant neoplasm of upper-outer quadrant of left female breast: Secondary | ICD-10-CM | POA: Insufficient documentation

## 2022-01-14 DIAGNOSIS — R600 Localized edema: Secondary | ICD-10-CM | POA: Diagnosis not present

## 2022-01-14 DIAGNOSIS — I82621 Acute embolism and thrombosis of deep veins of right upper extremity: Secondary | ICD-10-CM

## 2022-01-14 DIAGNOSIS — Z79899 Other long term (current) drug therapy: Secondary | ICD-10-CM | POA: Diagnosis not present

## 2022-01-14 MED ORDER — APIXABAN (ELIQUIS) VTE STARTER PACK (10MG AND 5MG): ORAL_TABLET | ORAL | 0 refills | Status: DC

## 2022-01-14 NOTE — Progress Notes (Signed)
Pt reports swelling to right arm, which she states started Sunday in the forearm, and by Sunday evening had progressed to her shoulder. She denies any injuries. No redness, pain, or streaking noted. States arm feels a little sore and tight.

## 2022-01-14 NOTE — Progress Notes (Addendum)
Symptom Management [place] at [facility] [phone] [phone]  Patient Care Team: Tower, Wynelle Fanny, MD as PCP - General Daiva Huge, RN as Oncology Nurse Navigator   Name of the patient: Megan Harrison  [id number]  1956/02/08   Date of visit: 01/14/22  Reason for Consult: JAGGER BEAHM is a 66 y.o. female with history of stage Ia left breast cancer who presents today for:  Right Arm Swelling: Patient reports to [facility] clinic with concerns of right arm swelling that started yesterday morning. She denies any known injury. Started in her forearm and has progressed down to her hand and up to shoulder. She states that the area does not feel warm. The area is a bit "tight" feeling from the swelling but is not red. She denies any swelling of her neck, face. No SOB, fever, chest pain, calf pain, headache. She has not tried anything for symptoms. Of note, her port is located on the right side. She does not have any known advanced disease.   Denies any neurologic complaints.  Patient offers no further specific complaints today.    PAST MEDICAL HISTORY: Past Medical History:  Diagnosis Date   Anemia    Cutaneous sarcoidosis    Degenerative disc disease    Diverticulosis    Dysplastic nevus 11/19/2017   L mid back 6.0 cm lat to spine   Foot pain    GERD (gastroesophageal reflux disease)    Hyperlipidemia    Malignant neoplasm of upper-outer quadrant of left breast in female, estrogen receptor negative ([place]) 11/04/2021   Migraine    Shingles    eyes    PAST SURGICAL HISTORY:  Past Surgical History:  Procedure Laterality Date   2D Echo  1999   Negative   ABDOMINAL HYSTERECTOMY  03/1998   APPENDECTOMY     Arm lesion, cutaneous sarcoid  12/2005   with normal CXR and ANA   BREAST BIOPSY Left 10/23/2021   [facility] biopsy/ heart clip/ [facility]   BREAST LUMPECTOMY WITH RADIOFREQUENCY TAG IDENTIFICATION Left [id number]/[id number]   lt br rf tag  placement   BREAST LUMPECTOMY,RADIO FREQ LOCALIZER,AXILLARY SENTINEL LYMPH NODE BIOPSY Left 11/20/2021   Procedure: BREAST LUMPECTOMY,RADIO FREQ LOCALIZER,AXILLARY SENTINEL LYMPH NODE BIOPSY;  Surgeon: Olean Ree, MD;  Location: [facility];  Service: General;  Laterality: Left;   CESAREAN SECTION     x2   COLONOSCOPY  11/0/2009,08/18/13   diverticulosis and hemorrhoids   DIAGNOSTIC LAPAROSCOPY     EYE SURGERY  10/13/2018   had shingles in left eye, had to have surgery, has contact lens in now.   FOOT NEUROMA SURGERY Left    LAPAROSCOPIC APPENDECTOMY N/A 02/18/2016   Procedure: APPENDECTOMY LAPAROSCOPIC;  Surgeon: Ralene Ok, MD;  Location: [place];  Service: General;  Laterality: N/A;   LUMBAR LAMINECTOMY  10/2017   PORTACATH PLACEMENT Right 12/05/2021   Procedure: INSERTION PORT-A-CATH;  Surgeon: Olean Ree, MD;  Location: [facility] OR[facility];  Service: General;  Laterality: Right;   ROTATOR CUFF REPAIR Left    TONSILLECTOMY     UPPER GASTROINTESTINAL ENDOSCOPY  11/15/2010   non-erosive gastritis, H. pylori negative    HEMATOLOGY/ONCOLOGY HISTORY:  Oncology History  Malignant neoplasm of upper-outer quadrant of left breast in female, estrogen receptor negative ([place])  11/04/2021 Initial Diagnosis   Malignant neoplasm of upper-outer quadrant of left breast in female, estrogen receptor negative ([place])   11/04/2021 Cancer Staging   Staging form: Breast, AJCC 8th Edition - Clinical stage from  11/04/2021: Stage IB (cT1b, cN0, cM0, G3, ER-, PR-, HER2-) - Signed by Sindy Guadeloupe, MD on 11/04/2021 Histologic grading system: 3 grade system   11/30/2021 Cancer Staging   Staging form: Breast, AJCC 8th Edition - Pathologic stage from 11/30/2021: Stage IB (pT1b, pN0(sn), cM0, G3, ER-, PR-, HER2-) - Signed by Sindy Guadeloupe, MD on 11/30/2021 Method of lymph node assessment: Sentinel lymph node biopsy Multigene prognostic tests performed: None Histologic grading system: 3 grade system   12/12/2021 - 12/14/2021  Chemotherapy   Patient is on Treatment Plan : BREAST TC q21d     01/02/2022 -  Chemotherapy   Patient is on Treatment Plan : BREAST TC q21d       ALLERGIES:  is allergic to codeine and sumatriptan.  MEDICATIONS:  Current Outpatient Medications  Medication Sig Dispense Refill   APIXABAN (ELIQUIS) VTE STARTER PACK (10MG AND 5MG) Take as directed on package: start with two-33m tablets twice daily for 7 days. On day 8, switch to one-551mtablet twice daily. Stop 81 mg aspirin and Mobic while on this medication 1 each 0   acetaminophen (TYLENOL) 500 MG tablet Take 2 tablets (1,000 mg total) by mouth every 6 (six) hours as needed for mild pain.     atorvastatin (LIPITOR) 10 MG tablet Take 1 tablet (10 mg total) by mouth daily. (Patient taking differently: Take 10 mg by mouth at bedtime.) 90 tablet 3   calcium carbonate (TUMS - DOSED IN MG ELEMENTAL CALCIUM) 500 MG chewable tablet Chew 1 tablet by mouth as needed for indigestion or heartburn.     Cholecalciferol (VITAMIN D3) 50 MCG (2000 UT) TABS Take 1 tablet by mouth daily.     ferrous sulfate 325 (65 FE) MG tablet Take 325 mg by mouth daily.     fluticasone (FLONASE) 50 MCG/ACT nasal spray USE ONE SPRAY(S) IN EACH NOSTRIL ONCE DAILY AS DIRECTED (Patient taking differently: 1 spray at bedtime. USE ONE SPRAY(S) IN EACH NOSTRIL ONCE DAILY AS DIRECTED) 48 g 3   gabapentin (NEURONTIN) 300 MG capsule Take 300 mg by mouth daily after supper.     Magnesium Hydroxide (DULCOLAX PO) Take 1 tablet by mouth 2 (two) times daily.     Multiple Vitamins-Minerals (ONE-A-DAY WOMENS 50 PLUS PO) Take 1 tablet by mouth daily.     prednisoLONE acetate (PRED FORTE) 1 % ophthalmic suspension Place 1 drop into the left eye every other day.     sulfamethoxazole-trimethoprim (BACTRIM DS) 800-160 MG tablet Take 1 tablet by mouth 2 (two) times daily. 10 tablet 0   Timolol Maleate 0.5 % (DAILY) SOLN Place 1 drop into the left eye daily at 6 (six) AM.     No current  facility-administered medications for this visit.   Facility-Administered Medications Ordered in Other Visits  Medication Dose Route Frequency Provider Last Rate Last Admin   heparin lock flush 100 UNIT/ML injection            palonosetron (ALOXI) 0.25 MG/5ML injection             VITAL SIGNS: BP 139/87 (BP Location: Right Arm, Patient Position: Sitting)   Pulse 77   Temp (!) 97 F (36.1 C) (Tympanic)   SpO2 99%  There were no vitals filed for this visit.  Estimated body mass index is 31.86 kg/m as calculated from the following:   Height as of 12/11/21: 5' 3.5" (1.613 m).   Weight as of 01/02/22: 182 lb 11.2 oz (82.9 kg).  LABS: CBC:  Component Value Date/Time   WBC 13.0 (H) 01/02/2022 0840   HGB 11.0 (L) 01/02/2022 0840   HCT 32.9 (L) 01/02/2022 0840   PLT 504 (H) 01/02/2022 0840   MCV 92.2 01/02/2022 0840   NEUTROABS 11.6 (H) 01/02/2022 0840   LYMPHSABS 0.9 01/02/2022 0840   MONOABS 0.4 01/02/2022 0840   EOSABS 0.0 01/02/2022 0840   BASOSABS 0.0 01/02/2022 0840   Comprehensive Metabolic Panel:    Component Value Date/Time   NA 137 01/02/2022 0840   K 3.8 01/02/2022 0840   CL 108 01/02/2022 0840   CO2 22 01/02/2022 0840   BUN 20 01/02/2022 0840   CREATININE 0.78 01/02/2022 0840   GLUCOSE 190 (H) 01/02/2022 0840   CALCIUM 9.3 01/02/2022 0840   AST 30 01/02/2022 0840   ALT 19 01/02/2022 0840   ALKPHOS 73 01/02/2022 0840   BILITOT 0.3 01/02/2022 0840   PROT 6.9 01/02/2022 0840   ALBUMIN 3.7 01/02/2022 0840    RADIOGRAPHIC STUDIES: US Venous Img Upper Uni Right  Result Date: 01/14/2022 CLINICAL DATA:  Right upper extremity edema EXAM: Right UPPER EXTREMITY VENOUS DOPPLER ULTRASOUND TECHNIQUE: Gray-scale sonography with graded compression, as well as color Doppler and duplex ultrasound were performed to evaluate the upper extremity deep venous system from the level of the subclavian vein and including the jugular, axillary, basilic, radial, ulnar and upper cephalic  vein. Spectral Doppler was utilized to evaluate flow at rest and with distal augmentation maneuvers. COMPARISON:  None Available. FINDINGS: Contralateral Subclavian Vein: Respiratory phasicity is normal and asymmetric with the symptomatic side. No evidence of thrombus. Normal compressibility. Internal Jugular Vein: No evidence of thrombus. Normal compressibility, respiratory phasicity and response to augmentation. Subclavian Vein: Noncompressible with nearly completely occlusive thrombus. Axillary Vein: Noncompressible with nearly completely occlusive thrombus. Cephalic Vein: Noncompressible in the proximal portion with nearly completely occlusive thrombus. Patent with no evidence of thrombus in the mid and distal portions with normal compressibility, respiratory phasicity, and response to augmentation. Basilic Vein: Noncompressible with nonocclusive thrombus in the proximal portion. Patent in the mid and distal portions Brachial Veins: 1 of the paired brachial veins is noncompressible with occlusive thrombus throughout. Radial Veins: No evidence of thrombus. Normal compressibility, respiratory phasicity and response to augmentation. Ulnar Veins: No evidence of thrombus. Normal compressibility, respiratory phasicity and response to augmentation. Venous Reflux:  None visualized. Other Findings:  None visualized. IMPRESSION: 1. Nearly occlusive DVT within 1 of the paired right brachial veins extending into the right axillary and subclavian veins. 2. Near occlusive thrombi within the proximal cephalic and proximal basilic veins. Critical Value/emergent results were called by telephone at the time of interpretation on 01/14/2022 at 3:19 pm to provider [facility] , who verbally acknowledged these results. Electronically Signed   By: Darrin Nipper M.D.   On: 01/14/2022 15:20    PERFORMANCE STATUS (ECOG) : 1 - Symptomatic but completely ambulatory  Review of Systems Unless otherwise noted, a complete review of systems  is negative.  Physical Exam General: NAD Cardiovascular: regular rate and rhythm. Radial pulse 2+ bilaterally.  Pulmonary: clear ant fields Abdomen: soft, nontender, + bowel sounds GU: no suprapubic tenderness Extremities: Moderate edema of the right arm, no joint deformities, no erythema, increased warmth, rash, sign of infection, dusky or pale discoloration.  Skin: no rashes Neurological: Weakness but otherwise nonfocal     Assessment and Plan- Patient is a 66 y.o. female  Encounter Diagnoses  Name Primary?   Edema of right upper arm Yes   Port-A-Cath  in place    Malignant neoplasm of upper-outer quadrant of left breast in female, estrogen receptor negative ([place])    Acute embolism and thrombosis of deep vein of right upper extremity ([place])    New. 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 Arterial thromboembolism not likely given coloration of arm, intact pulses. STAT US of the right arm. If non-specific will obtain STAT CT imaging. Discussed red flag signs and symptoms warranting immediate ER evaluation. No jewelry needing to be removed at this time. Follow up timing will depend on [place] results.   UPDATE: US Vascular Right Upper shown below  "IMPRESSION: 1. Nearly occlusive DVT within 1 of the paired right brachial veins extending into the right axillary and subclavian veins. 2. Near occlusive thrombi within the proximal cephalic and proximal basilic veins."  Discussed with Eulogio Ditch NP with [facility]. NO role in hospitalization at this time. Will start on Eliquis and submit urgent referral to [facility]. Discussed with Dr. Janese Banks and patient. Reviewed how to take the Eliquis along with red flags, risks/benefits. She is agreeable. She will follow up with my office later this week to ensure she is feeling better and tolerating the medication well.   Patient expressed  understanding and was in agreement with this plan. She also understands that She can call clinic at any time with any questions, concerns, or complaints.   Thank you for allowing me to participate in the care of this very pleasant patient.   Time Total: 40  Visit consisted of counseling and education dealing with the complex and emotionally intense issues of symptom management in the setting of serious illness.Greater than 50%  of this time was spent counseling and coordinating care related to the above assessment and plan.  Signed by: Nelwyn Salisbury, PA-C

## 2022-01-14 NOTE — Telephone Encounter (Signed)
Patient called reporting that she had a sudden onset of swelling in her right forearm which over hours spread into her wrist, hand and up to her shoulder. She reports aching in the joints. She states that it was red in her forearm when it started but not this morning. Her cancer was in her left breast. She denies injury to arm and states that she was not wearing any constricting clothing, The arm is not hot to touch. She is asking what to do, please advise

## 2022-01-14 NOTE — Addendum Note (Signed)
Addended by: Nelwyn Salisbury on: 01/14/2022 03:44 PM   Modules accepted: Orders, Level of Service

## 2022-01-14 NOTE — Telephone Encounter (Signed)
Pt scheduled with [facility]. Pt aware of apt

## 2022-01-15 ENCOUNTER — Encounter (INDEPENDENT_AMBULATORY_CARE_PROVIDER_SITE_OTHER): Payer: Self-pay | Admitting: Vascular Surgery

## 2022-01-15 ENCOUNTER — Telehealth (INDEPENDENT_AMBULATORY_CARE_PROVIDER_SITE_OTHER): Payer: Self-pay

## 2022-01-15 ENCOUNTER — Ambulatory Visit (INDEPENDENT_AMBULATORY_CARE_PROVIDER_SITE_OTHER): Payer: Medicare PPO | Admitting: Vascular Surgery

## 2022-01-15 ENCOUNTER — Telehealth: Payer: Self-pay | Admitting: Medical Oncology

## 2022-01-15 VITALS — BP 136/77 | HR 84 | Resp 16 | Wt 181.8 lb

## 2022-01-15 DIAGNOSIS — Z171 Estrogen receptor negative status [ER-]: Secondary | ICD-10-CM | POA: Diagnosis not present

## 2022-01-15 DIAGNOSIS — Z86718 Personal history of other venous thrombosis and embolism: Secondary | ICD-10-CM | POA: Insufficient documentation

## 2022-01-15 DIAGNOSIS — C50412 Malignant neoplasm of upper-outer quadrant of left female breast: Secondary | ICD-10-CM

## 2022-01-15 DIAGNOSIS — I82A11 Acute embolism and thrombosis of right axillary vein: Secondary | ICD-10-CM

## 2022-01-15 DIAGNOSIS — E7800 Pure hypercholesterolemia, unspecified: Secondary | ICD-10-CM

## 2022-01-15 NOTE — Assessment & Plan Note (Signed)
lipid control important in reducing the progression of atherosclerotic disease. Continue statin therapy  

## 2022-01-15 NOTE — Telephone Encounter (Signed)
Called to ensure patient was feeling better. No worsening of symptoms. She is tolerating the Eliquis well. On her way to her vascular appointment now. She has follow up later this week with our office.

## 2022-01-15 NOTE — Telephone Encounter (Signed)
Spoke with the patient and she is scheduled with Dr. Lucky Cowboy for a right arm thrombectomy on 01/16/22 with a 11:00 am arrival time to the [facility]. Pre-procedure instructions were discussed and patient stated she understood.

## 2022-01-15 NOTE — Assessment & Plan Note (Signed)
Currently getting chemotherapy and has a Port-A-Cath in place.  Only 2 more treatments remaining.

## 2022-01-15 NOTE — Progress Notes (Signed)
Patient ID: Megan Harrison, female   DOB: 26-Sep-1955, 66 y.o.   MRN: [id number]  No chief complaint on file.   HPI Megan Harrison is a 66 y.o. female.  I am asked to see the patient by Nelwyn Salisbury for evaluation of extensive DVT of the right upper extremity.  The patiently is currently undergoing chemotherapy for left breast cancer.  She has 2 more treatments scheduled, 1 next week and 1 3 weeks after that.  2 days ago, she woke up with marked swelling in the right arm.  This was a new finding.  About 6 weeks ago, she had a right subclavian Port-A-Cath placed by general surgeon as she has very difficult venous access and this was required for her chemotherapy.  She underwent an ultrasound which I reviewed which shows the extensive DVT involving the brachial vein, axillary vein and subclavian vein on the right.  She was started on anticoagulation yesterday.  Her swelling remains very pronounced on that right side.  Prior to Sunday, she was not having any symptoms in the right arm.     Past Medical History:  Diagnosis Date   Anemia    Cutaneous sarcoidosis    Degenerative disc disease    Diverticulosis    Dysplastic nevus 11/19/2017   L mid back 6.0 cm lat to spine   Foot pain    GERD (gastroesophageal reflux disease)    Hyperlipidemia    Malignant neoplasm of upper-outer quadrant of left breast in female, estrogen receptor negative ([place]) 11/04/2021   Migraine    Shingles    eyes    Past Surgical History:  Procedure Laterality Date   2D Echo  1999   Negative   ABDOMINAL HYSTERECTOMY  03/1998   APPENDECTOMY     Arm lesion, cutaneous sarcoid  12/2005   with normal CXR and ANA   BREAST BIOPSY Left 10/23/2021   US biopsy/ heart clip/ [facility]   BREAST LUMPECTOMY WITH RADIOFREQUENCY TAG IDENTIFICATION Left 62/83/1517   lt br rf tag placement   BREAST LUMPECTOMY,RADIO FREQ [place] BIOPSY Left 11/20/2021   Procedure: BREAST LUMPECTOMY,RADIO FREQ  LOCALIZER,AXILLARY SENTINEL LYMPH NODE BIOPSY;  Surgeon: Olean Ree, MD;  Location: [facility];  Service: General;  Laterality: Left;   CESAREAN SECTION     x2   COLONOSCOPY  11/0/2009,08/18/13   diverticulosis and hemorrhoids   DIAGNOSTIC LAPAROSCOPY     EYE SURGERY  10/13/2018   had shingles in left eye, had to have surgery, has contact lens in now.   FOOT NEUROMA SURGERY Left    LAPAROSCOPIC APPENDECTOMY N/A 02/18/2016   Procedure: APPENDECTOMY LAPAROSCOPIC;  Surgeon: Ralene Ok, MD;  Location: [place];  Service: General;  Laterality: N/A;   LUMBAR LAMINECTOMY  10/2017   PORTACATH PLACEMENT Right 12/05/2021   Procedure: INSERTION PORT-A-CATH;  Surgeon: Olean Ree, MD;  Location: [facility];  Service: General;  Laterality: Right;   ROTATOR CUFF REPAIR Left    TONSILLECTOMY     UPPER GASTROINTESTINAL ENDOSCOPY  11/15/2010   non-erosive gastritis, H. pylori negative     Family History  Problem Relation Age of Onset   Colon cancer Brother        early 16's   Stomach cancer Brother    Heart disease Mother        CAD   Alcohol abuse Mother    Lung cancer Father    Heart disease Brother        CAD  Throat cancer Brother    Heart disease Brother        CAD, pre diabetic, obese   Colon polyps Sister    Breast cancer Neg Hx    Rectal cancer Neg Hx       Social History   Tobacco Use   Smoking status: Never   Smokeless tobacco: Never  Vaping Use   Vaping Use: Never used  Substance Use Topics   Alcohol use: No    Alcohol/week: 0.0 standard drinks of alcohol   Drug use: No     Allergies  Allergen Reactions   Codeine     REACTION: syncope   Sumatriptan     REACTION: choking sensation    Current Outpatient Medications  Medication Sig Dispense Refill   acetaminophen (TYLENOL) 500 MG tablet Take 2 tablets (1,000 mg total) by mouth every 6 (six) hours as needed for mild pain.     APIXABAN (ELIQUIS) VTE STARTER PACK (10MG AND 5MG) Take as directed on package:  start with two-30m tablets twice daily for 7 days. On day 8, switch to one-529mtablet twice daily. Stop 81 mg aspirin and Mobic while on this medication 1 each 0   atorvastatin (LIPITOR) 10 MG tablet Take 1 tablet (10 mg total) by mouth daily. (Patient taking differently: Take 10 mg by mouth at bedtime.) 90 tablet 3   calcium carbonate (TUMS - DOSED IN MG ELEMENTAL CALCIUM) 500 MG chewable tablet Chew 1 tablet by mouth as needed for indigestion or heartburn.     Cholecalciferol (VITAMIN D3) 50 MCG (2000 UT) TABS Take 1 tablet by mouth daily.     ferrous sulfate 325 (65 FE) MG tablet Take 325 mg by mouth daily.     fluticasone (FLONASE) 50 MCG/ACT nasal spray USE ONE SPRAY(S) IN EACH NOSTRIL ONCE DAILY AS DIRECTED (Patient taking differently: 1 spray at bedtime. USE ONE SPRAY(S) IN EACH NOSTRIL ONCE DAILY AS DIRECTED) 48 g 3   gabapentin (NEURONTIN) 300 MG capsule Take 300 mg by mouth daily after supper.     Magnesium Hydroxide (DULCOLAX PO) Take 1 tablet by mouth 2 (two) times daily.     Multiple Vitamins-Minerals (ONE-A-DAY WOMENS 50 PLUS PO) Take 1 tablet by mouth daily.     prednisoLONE acetate (PRED FORTE) 1 % ophthalmic suspension Place 1 drop into the left eye every other day.     sulfamethoxazole-trimethoprim (BACTRIM DS) 800-160 MG tablet Take 1 tablet by mouth 2 (two) times daily. 10 tablet 0   Timolol Maleate 0.5 % (DAILY) SOLN Place 1 drop into the left eye daily at 6 (six) AM.     No current facility-administered medications for this visit.   Facility-Administered Medications Ordered in Other Visits  Medication Dose Route Frequency Provider Last Rate Last Admin   heparin lock flush 100 UNIT/ML injection            palonosetron (ALOXI) 0.25 MG/5ML injection               REVIEW OF SYSTEMS (Negative unless checked)  Constitutional: [] Weight loss  [] Fever  [] Chills Cardiac: [] Chest pain   [] Chest pressure   [] Palpitations   [] Shortness of breath when laying flat   [] Shortness of  breath at rest   [x] Shortness of breath with exertion. Vascular:  [] Pain in legs with walking   [] Pain in legs at rest   [] Pain in legs when laying flat   [] Claudication   [] Pain in feet when walking  [] Pain in feet at rest  []   Pain in feet when laying flat   [] History of DVT   [x] Phlebitis   [x] Swelling in legs   [] Varicose veins   [] Non-healing ulcers Pulmonary:   [] Uses home oxygen   [] Productive cough   [] Hemoptysis   [] Wheeze  [] COPD   [] Asthma Neurologic:  [] Dizziness  [] Blackouts   [] Seizures   [] History of stroke   [] History of TIA  [] Aphasia   [] Temporary blindness   [] Dysphagia   [] Weakness or numbness in arms   [] Weakness or numbness in legs Musculoskeletal:  [x] Arthritis   [] Joint swelling   [] Joint pain   [] Low back pain Hematologic:  [] Easy bruising  [] Easy bleeding   [] Hypercoagulable state   [x] Anemic  [] Hepatitis Gastrointestinal:  [] Blood in stool   [] Vomiting blood  [x] Gastroesophageal reflux/heartburn   [] Abdominal pain Genitourinary:  [] Chronic kidney disease   [] Difficult urination  [] Frequent urination  [] Burning with urination   [] Hematuria Skin:  [] Rashes   [] Ulcers   [] Wounds Psychological:  [] History of anxiety   []  History of major depression.    Physical Exam BP 136/77 (BP Location: Right Arm)   Pulse 84   Resp 16   Wt 181 lb 12.8 oz (82.5 kg)   BMI 31.70 kg/m  Gen:  WD/WN, NAD Head: [place]/AT, No temporalis wasting.  Ear/Nose/Throat: Hearing grossly intact, nares w/o erythema or drainage, oropharynx w/o Erythema/Exudate Eyes: Conjunctiva clear, sclera non-icteric  Neck: trachea midline.  No JVD.  Pulmonary:  Good air movement, respirations not labored, no use of accessory muscles  Cardiac: RRR, no JVD.  Port in place in the right chest.  No erythema or drainage. Vascular:  Vessel Right Left  Radial Palpable Palpable                                   Gastrointestinal:. No masses, surgical incisions, or scars. Musculoskeletal: M/S 5/5 throughout.   Extremities without ischemic changes.  No deformity or atrophy.  2-3+ right upper extremity edema. Neurologic: Sensation grossly intact in extremities.  Symmetrical.  Speech is fluent. Motor exam as listed above. Psychiatric: Judgment intact, Mood & affect appropriate for pt's clinical situation. Dermatologic: No rashes or ulcers noted.  No cellulitis or open wounds.    Radiology US Venous Img Upper Uni Right  Result Date: 01/14/2022 CLINICAL DATA:  Right upper extremity edema EXAM: Right UPPER EXTREMITY VENOUS DOPPLER ULTRASOUND TECHNIQUE: Gray-scale sonography with graded compression, as well as color Doppler and duplex ultrasound were performed to evaluate the upper extremity deep venous system from the level of the subclavian vein and including the jugular, axillary, basilic, radial, ulnar and upper cephalic vein. Spectral Doppler was utilized to evaluate flow at rest and with distal augmentation maneuvers. COMPARISON:  None Available. FINDINGS: Contralateral Subclavian Vein: Respiratory phasicity is normal and asymmetric with the symptomatic side. No evidence of thrombus. Normal compressibility. Internal Jugular Vein: No evidence of thrombus. Normal compressibility, respiratory phasicity and response to augmentation. Subclavian Vein: Noncompressible with nearly completely occlusive thrombus. Axillary Vein: Noncompressible with nearly completely occlusive thrombus. Cephalic Vein: Noncompressible in the proximal portion with nearly completely occlusive thrombus. Patent with no evidence of thrombus in the mid and distal portions with normal compressibility, respiratory phasicity, and response to augmentation. Basilic Vein: Noncompressible with nonocclusive thrombus in the proximal portion. Patent in the mid and distal portions Brachial Veins: 1 of the paired brachial veins is noncompressible with occlusive thrombus throughout. Radial Veins: No evidence of thrombus. Normal compressibility,  respiratory  phasicity and response to augmentation. Ulnar Veins: No evidence of thrombus. Normal compressibility, respiratory phasicity and response to augmentation. Venous Reflux:  None visualized. Other Findings:  None visualized. IMPRESSION: 1. Nearly occlusive DVT within 1 of the paired right brachial veins extending into the right axillary and subclavian veins. 2. Near occlusive thrombi within the proximal cephalic and proximal basilic veins. Critical Value/emergent results were called by telephone at the time of interpretation on 01/14/2022 at 3:19 pm to provider [facility] , who verbally acknowledged these results. Electronically Signed   By: Darrin Nipper M.D.   On: 01/14/2022 15:20    Labs Recent Results (from the past 2160 hour(s))  Surgical pathology     Status: None   Collection Time: 10/23/21  9:12 AM  Result Value Ref Range   SURGICAL PATHOLOGY      SURGICAL PATHOLOGY * THIS IS AN ADDENDUM REPORT * CASE: [id number] PATIENT: Clotee Lappe Surgical Pathology Report *Addendum *  Reason for Addendum #1:  Breast Biomarker Results  Specimen Submitted: A. Breast, left, 2 o'clock 8 cm from nipple; biopsy  Clinical History: New indeterminate 0.6 cm mass involving the upper outer quadrant of the left breast at 2 o'clock 8 cm from the nipple. Post biopsy mammograms show appropriate positioning of the heart shaped biopsy marking clip at the posterosuperior margin of the biopsied mass.     DIAGNOSIS: A. BREAST, LEFT, 2 O'CLOCK 8 CM FROM NIPPLE; ULTRASOUND-GUIDED CORE BIOPSY: - INVASIVE MAMMARY CARCINOMA, NO SPECIAL TYPE.  Size of invasive carcinoma: 4 mm in this sample Histologic grade of invasive carcinoma: Grade 3                      Glandular/tubular differentiation score: 3                      Nuclear pleomorphism score: 2                      Mitotic rate score: 3                       Total score: 8 Ductal carcinoma in situ: Not identified Lymphovascular invasion: Not  identified  ER/PR/HER2: Immunohistochemistry will be performed on block A1, with reflex to [place] for HER2 2+. The results will be reported in an addendum.  Comment: The definitive grade will be assigned on the excisional specimen.  GROSS DESCRIPTION: A. Labeled: Left breast 2:00 8 cm nipple Received: Formalin Time/date in fixative: Collected and placed in formalin at 9:12 AM on 10/23/2021 Cold ischemic time: Less than 1 minute Total fixation time: Approximately 11 hours Core pieces: 5 cores and multiple additional fragments Size: Range from 0.4-1 cm in length and 0.3 cm in diameter Description: Received are cores and fragments of yellow soft tissue, tan-pink soft tissue and blood clot.  The additional fragments are 0.2 x 0.2 x 0.1 cm in aggregate. Ink color: Green Entirely submitted in cassettes 1-2 with 3 cores in cassette 1 and 2 cores with the remaining fragments in casset te 2.  RB 10/23/2021   Final Diagnosis performed by Bryan Lemma, MD.   Electronically signed 10/24/2021 10:27:07AM The electronic signature indicates that the named Attending Pathologist has evaluated the specimen Technical component performed at [facility], [address], [place], [place] [address] Lab: [phone] Dir: Rush Farmer, MD, MMM  Professional component performed at [facility], [facility], [address] [place], [place]  [address] Lab: [phone] Dir: Kathi Simpers, MD  ADDENDUM: Comment: The mitotic rate in this triple-negative tumor is extremely high. which will probably correlate with rapid increase in tumor size.  CASE SUMMARY: BREAST BIOMARKER TESTS Estrogen Receptor (ER) Status: NEGATIVE (LESS THAN 1%)         Internal control cells present A second pathologist review was performed by Dr. Ronnald Ramp.  Progesterone Receptor (PgR) Status: NEGATIVE (LESS THAN 1%)         Internal control cells NEGATIVE (LESS THAN 1%)         Intern al control cells present  HER2 (by  immunohistochemistry): NEGATIVE (Score 0) Ki-67: Not performed  Cold Ischemia and Fixation Times: Meet requirements specified in latest version of the ASCO/CAP guidelines Testing Performed on Block Number(s): A1  METHODS Fixative: Formalin Estrogen Receptor:  FDA cleared ([place]) Primary Antibody:  SP1 Progesterone Receptor: FDA cleared ([place]) Primary Antibody: 1E2 HER2 (by IHC): FDA approved ([place]) Primary Antibody: 4B5 ([facility]WAY) Immunohistochemistry controls worked appropriately. Slides were prepared by [facility], [place], and interpreted by Dr. Dicie Beam. (v1.5.0.1)    Addendum #1 performed by Bryan Lemma, MD.   Electronically signed 10/25/2021 12:51:25PM The electronic signature indicates that the named Attending Pathologist has evaluated the specimen Technical component performed at [facility], [address] [place], [place] [address] Lab: [phone] Dir: Rush Farmer, MD, MMM  Professional component performed at [facility], [facility], [place], [place], [place] [address] Lab: [phone] Dir: Kathi Simpers, MD   Basic metabolic panel     Status: Abnormal   Collection Time: 11/16/21  2:52 PM  Result Value Ref Range   Sodium 141 135 - 145 mmol/L   Potassium 3.9 3.5 - 5.1 mmol/L   Chloride 106 98 - 111 mmol/L   CO2 28 22 - 32 mmol/L   Glucose, Bld 115 (H) 70 - 99 mg/dL    Comment: Glucose reference range applies only to samples taken after fasting for at least 8 hours.   BUN 20 8 - 23 mg/dL   Creatinine, Ser 0.77 0.44 - 1.00 mg/dL   Calcium 9.5 8.9 - 10.3 mg/dL   GFR, Estimated >60 >60 mL/min    Comment: (NOTE) Calculated using the CKD-EPI Creatinine Equation (2021)    Anion gap 7 5 - 15    Comment: Performed at [facility], [place] [place], [place] [address]  CBC     Status: None   Collection Time: 11/16/21  2:52 PM  Result Value Ref Range   WBC 8.9 4.0 - 10.5 K/uL   RBC 4.05 3.87 - 5.11 MIL/uL   Hemoglobin  12.2 12.0 - 15.0 g/dL   HCT 37.3 36.0 - 46.0 %   MCV 92.1 80.0 - 100.0 fL   MCH 30.1 26.0 - 34.0 pg   MCHC 32.7 30.0 - 36.0 g/dL   RDW 13.0 11.5 - 15.5 %   Platelets 317 150 - 400 K/uL   nRBC 0.0 0.0 - 0.2 %    Comment: Performed at [facility], [address], [place], [place] [address]  Surgical pathology     Status: None   Collection Time: 11/20/21  1:05 PM  Result Value Ref Range   SURGICAL PATHOLOGY      SURGICAL PATHOLOGY CASE: [id number] PATIENT: [facility] Surgical Pathology Report     Specimen Submitted: A. Breast, left B. Sentinel node 1 C. Sentinel node 2  Clinical History: Left breast cancer    DIAGNOSIS: A. BREAST, LEFT;  EXCISION: - INVASIVE MAMMARY CARCINOMA, NO SPECIAL TYPE. - DUCTAL CARCINOMA IN SITU, HIGH-GRADE WITH COMEDONECROSIS. - CLIP, RF TAG, AND BIOPSY SITE IDENTIFIED. - SEE CANCER SUMMARY.  B. LYMPH NODE, LEFT AXILLARY SENTINEL #1 (HOT/BLUE #771); EXCISION: - ONE LYMPH NODE, NEGATIVE FOR MALIGNANCY (0/1).  C. LYMPH NODE, LEFT AXILLARY SENTINEL #2; EXCISION: - ONE LYMPH NODE, NEGATIVE FOR MALIGNANCY (0/1).  CANCER CASE SUMMARY: INVASIVE CARCINOMA OF THE BREAST Standard(s): AJCC-UICC 8  SPECIMEN Procedure: Excision Specimen Laterality: Left  TUMOR Histologic Type: Invasive carcinoma of no special type (ductal) Histologic Grade (Nottingham Histologic Score)      Glandular (Acinar)/Tubular Differentiation: 3      Nuclear Ple omorphism: 3      Mitotic Rate: 3      Overall Grade: Grade 3 Tumor Size: 8 mm Tumor Focality: Single focus of invasive carcinoma Ductal Carcinoma In Situ (DCIS): Present, high-grade with comedonecrosis Tumor Extent: Not applicable Lymphatic and/or Vascular Invasion: Not identified Treatment Effect in the Breast: No known presurgical therapy  MARGINS Margin Status for Invasive Carcinoma: All margins negative for invasive carcinoma      Distance from closest margin: 10 mm      Specify closest  margin: All surgical margins  Margin Status for DCIS: All margins negative for DCIS      Distance from DCIS to closest margin: 2 mm      Specify closest margin: Inferior, posterior  REGIONAL LYMPH NODES Regional Lymph Node Status: All regional lymph nodes negative for tumor      Total Number of Lymph Nodes Examined (sentinel and non-sentinel): 2       Number of Sentinel Nodes Examined: 2  DISTANT METASTASIS Distant Site(s) Involved, if applicable: Not applicable  PATHOLO GIC STAGE CLASSIFICATION (pTNM, AJCC 8th Edition): Modified Classification: Not applicable pT Category: pT1b T Suffix: Not applicable pN Category: pN0 N Suffix: sn pM Category: Not applicable  SPECIAL STUDIES Breast Biomarker Testing Performed on Previous Biopsy: [phone] Estrogen Receptor (ER) Status: NEGATIVE (LESS THAN 1%)  Progesterone Receptor (PgR) Status: NEGATIVE (LESS THAN 1%)  HER2 (by immunohistochemistry): NEGATIVE (Score 0) Ki-67: Not performed  (v4.9.0.0)  GROSS DESCRIPTION: Intraoperative Consultation:     Labeled: Left breast mass     Received: Fresh     Specimen: Breast lumpectomy     Pathologic evaluation performed: Gross margin evaluation     Diagnosis: IOC: Mass with clip.  Margins grossly clear.     Communicated to: Called to Dr. Hampton Abbot at 1:16 PM on 11/20/2021 Quay Burow M.D.     Tissue submitted: None  A. Labeled: Left breast mass Received: Fresh Specimen radiograph image(s) available for review Radiographic findings: A clip  and RF ID tag are present. Time in fixative: Collected at 1:05 PM on 11/20/2021 and placed in formalin at 1:16 PM on 11/20/2021 Cold ischemic time: Less than 30 minutes Total fixation time: Approximately 28 hours Type of procedure: Breast lumpectomy Location / laterality of specimen: Left breast Orientation of specimen: The specimen is received inked. Inking: Anterior = green Inferior = blue Lateral = orange Medial = yellow Posterior =  black Superior = red Size of specimen: 5.8 (medial to lateral) x 5.2 (superior to inferior) x 3.1 (anterior to posterior) cm Skin: None grossly identified.  Biopsy site: There is a biopsy site which contains a heart-shaped clip.  Number of discrete masses: 1 Size of mass(es): 0.9 x 0.8 x 0.6 cm Description of mass(es): The mass is tan-white, firm and focally ill-defined. Distance between masses/clips: The clip  is located within the mass. Margins: Anterior = 0.7 cm, superior = 1 cm, inferior = 0.9 cm, posterior = 1 cm, medial = 0. 7 cm, lateral = 0.7 cm Description of remainder of tissue: The remainder of the specimen is comprised of yellow lobulated adipose tissue admixed with scattered areas of tan fibrous tissue.  The fat to fibrous tissue ratio is 90:10.  Block summary (approximately 30% of the mass remains and approximately 60% of the specimen remains.): 1 - 2 - medial margin, perpendicularly sectioned 3 - medial margin closest to mass 4 - 5 - representative sections of mass      4 - clip site with closest approach to anterior, inferior, and posterior margins      5 - additional anterior margin 6 - additional inferior and posterior margins to mass 7 - closest superior margins to mass 8 - closest lateral margins to mass 9 - 12 - lateral margin, perpendicularly sectioned  B. Labeled: Left sentinel node #1 (hot/blue 771) Received: Fresh Collection time: 11:54 AM on 11/20/2021 Placed into formalin time: 1:54 PM on 11/20/2021 Tissue fragment(s): 1 Size: 1.8 x 1.1 x 0.6 cm Description: Recei ved is a blue dyed lymph node candidate with a scant amount of surrounding adipose tissue.  The specimen is serially sectioned. Entirely submitted in 1 cassette.  C. Labeled: Sentinel node 2 Received: Fresh Collection time: 12:14 PM on 11/20/2021 Placed into formalin time: 1:54 PM on 11/20/2021 Tissue fragment(s): 1 Size: 1.2 x 1 x 0.5 cm Description: Received is a lymph node candidate  with a scant amount of surrounding adipose tissue.  The specimen is bisected revealing central areas of adipose tissue. Entirely submitted in 1 cassette.  RB 11/21/2021   Final Diagnosis performed by Allena Napoleon, MD.   Electronically signed 11/22/2021 2:57:50PM The electronic signature indicates that the named Attending Pathologist has evaluated the specimen Technical component performed at [facility], [address] [place], [place] [address] Lab: [phone] Dir: Rush Farmer, MD, MMM  Professional component performed at [facility], [facility], [place] , [place], [place] [address] Lab: [phone] Dir: Kathi Simpers, MD   CBC with Differential     Status: Abnormal   Collection Time: 12/12/21  9:07 AM  Result Value Ref Range   WBC 13.9 (H) 4.0 - 10.5 K/uL   RBC 4.05 3.87 - 5.11 MIL/uL   Hemoglobin 12.3 12.0 - 15.0 g/dL   HCT 37.2 36.0 - 46.0 %   MCV 91.9 80.0 - 100.0 fL   MCH 30.4 26.0 - 34.0 pg   MCHC 33.1 30.0 - 36.0 g/dL   RDW 12.9 11.5 - 15.5 %   Platelets 360 150 - 400 K/uL   nRBC 0.0 0.0 - 0.2 %   Neutrophils Relative % 84 %   Neutro Abs 11.8 (H) 1.7 - 7.7 K/uL   Lymphocytes Relative 11 %   Lymphs Abs 1.5 0.7 - 4.0 K/uL   Monocytes Relative 4 %   Monocytes Absolute 0.6 0.1 - 1.0 K/uL   Eosinophils Relative 0 %   Eosinophils Absolute 0.0 0.0 - 0.5 K/uL   Basophils Relative 0 %   Basophils Absolute 0.0 0.0 - 0.1 K/uL   Immature Granulocytes 1 %   Abs Immature Granulocytes 0.07 0.00 - 0.07 K/uL    Comment: Performed at [facility], [address], [place], [place] [address]  Comprehensive metabolic panel     Status: Abnormal   Collection Time: 12/12/21  9:07 AM  Result Value  Ref Range   Sodium 140 135 - 145 mmol/L   Potassium 3.9 3.5 - 5.1 mmol/L   Chloride 106 98 - 111 mmol/L   CO2 24 22 - 32 mmol/L   Glucose, Bld 150 (H) 70 - 99 mg/dL    Comment: Glucose reference range applies only to samples taken after fasting for at least 8  hours.   BUN 23 8 - 23 mg/dL   Creatinine, Ser 0.65 0.44 - 1.00 mg/dL   Calcium 9.8 8.9 - 10.3 mg/dL   Total Protein 7.8 6.5 - 8.1 g/dL   Albumin 4.1 3.5 - 5.0 g/dL   AST 26 15 - 41 U/L   ALT 17 0 - 44 U/L   Alkaline Phosphatase 81 38 - 126 U/L   Total Bilirubin 0.3 0.3 - 1.2 mg/dL   GFR, Estimated >60 >60 mL/min    Comment: (NOTE) Calculated using the CKD-EPI Creatinine Equation (2021)    Anion gap 10 5 - 15    Comment: Performed at [facility], [address], [place], [place] [address]  Basic metabolic panel     Status: Abnormal   Collection Time: 12/19/21 10:10 AM  Result Value Ref Range   Sodium 135 135 - 145 mmol/L   Potassium 3.8 3.5 - 5.1 mmol/L   Chloride 103 98 - 111 mmol/L   CO2 23 22 - 32 mmol/L   Glucose, Bld 121 (H) 70 - 99 mg/dL    Comment: Glucose reference range applies only to samples taken after fasting for at least 8 hours.   BUN 21 8 - 23 mg/dL   Creatinine, Ser 0.76 0.44 - 1.00 mg/dL   Calcium 8.7 (L) 8.9 - 10.3 mg/dL   GFR, Estimated >60 >60 mL/min    Comment: (NOTE) Calculated using the CKD-EPI Creatinine Equation (2021)    Anion gap 9 5 - 15    Comment: Performed at [facility], [address] [place], [place] [address]  CBC with Differential     Status: Abnormal   Collection Time: 12/19/21 10:10 AM  Result Value Ref Range   WBC 6.6 4.0 - 10.5 K/uL   RBC 4.06 3.87 - 5.11 MIL/uL   Hemoglobin 12.3 12.0 - 15.0 g/dL   HCT 37.2 36.0 - 46.0 %   MCV 91.6 80.0 - 100.0 fL   MCH 30.3 26.0 - 34.0 pg   MCHC 33.1 30.0 - 36.0 g/dL   RDW 12.8 11.5 - 15.5 %   Platelets 236 150 - 400 K/uL   nRBC 0.0 0.0 - 0.2 %   Neutrophils Relative % 12 %   Neutro Abs 0.8 (L) 1.7 - 7.7 K/uL   Lymphocytes Relative 29 %   Lymphs Abs 1.9 0.7 - 4.0 K/uL   Monocytes Relative 31 %   Monocytes Absolute 2.1 (H) 0.1 - 1.0 K/uL   Eosinophils Relative 2 %   Eosinophils Absolute 0.2 0.0 - 0.5 K/uL   Basophils Relative 1 %   Basophils Absolute 0.0 0.0 - 0.1 K/uL    WBC Morphology      MARKED LEFT SHIFT (>5% METAS,MYELOS AND PROS, OCC BLAST NOTED)    Comment: TOXIC GRANULATION   RBC Morphology MORPHOLOGY UNREMARKABLE    Smear Review Normal platelet morphology     Comment: PLATELETS APPEAR ADEQUATE   Immature Granulocytes 25 %    Comment: Increased IG's, likely caused by Bone Marrow Colony Stimulating Factor received within 30 days. UDENYCA ON 8.11.23    Abs Immature Granulocytes 1.63 (H)  0.00 - 0.07 K/uL    Comment: Performed at [facility], [address] [place], [place] [address]  Comprehensive metabolic panel     Status: Abnormal   Collection Time: 01/02/22  8:40 AM  Result Value Ref Range   Sodium 137 135 - 145 mmol/L   Potassium 3.8 3.5 - 5.1 mmol/L   Chloride 108 98 - 111 mmol/L   CO2 22 22 - 32 mmol/L   Glucose, Bld 190 (H) 70 - 99 mg/dL    Comment: Glucose reference range applies only to samples taken after fasting for at least 8 hours.   BUN 20 8 - 23 mg/dL   Creatinine, Ser 0.78 0.44 - 1.00 mg/dL   Calcium 9.3 8.9 - 10.3 mg/dL   Total Protein 6.9 6.5 - 8.1 g/dL   Albumin 3.7 3.5 - 5.0 g/dL   AST 30 15 - 41 U/L   ALT 19 0 - 44 U/L   Alkaline Phosphatase 73 38 - 126 U/L   Total Bilirubin 0.3 0.3 - 1.2 mg/dL   GFR, Estimated >60 >60 mL/min    Comment: (NOTE) Calculated using the CKD-EPI Creatinine Equation (2021)    Anion gap 7 5 - 15    Comment: Performed at [facility], [address] [place], [place] [address]  CBC with Differential     Status: Abnormal   Collection Time: 01/02/22  8:40 AM  Result Value Ref Range   WBC 13.0 (H) 4.0 - 10.5 K/uL   RBC 3.57 (L) 3.87 - 5.11 MIL/uL   Hemoglobin 11.0 (L) 12.0 - 15.0 g/dL   HCT 32.9 (L) 36.0 - 46.0 %   MCV 92.2 80.0 - 100.0 fL   MCH 30.8 26.0 - 34.0 pg   MCHC 33.4 30.0 - 36.0 g/dL   RDW 14.1 11.5 - 15.5 %   Platelets 504 (H) 150 - 400 K/uL   nRBC 0.0 0.0 - 0.2 %   Neutrophils Relative % 89 %   Neutro Abs 11.6 (H) 1.7 - 7.7 K/uL   Lymphocytes Relative 7  %   Lymphs Abs 0.9 0.7 - 4.0 K/uL   Monocytes Relative 3 %   Monocytes Absolute 0.4 0.1 - 1.0 K/uL   Eosinophils Relative 0 %   Eosinophils Absolute 0.0 0.0 - 0.5 K/uL   Basophils Relative 0 %   Basophils Absolute 0.0 0.0 - 0.1 K/uL   Immature Granulocytes 1 %   Abs Immature Granulocytes 0.08 (H) 0.00 - 0.07 K/uL    Comment: Performed at [facility], [address] [place], [place] [address]  Urinalysis, Complete w Microscopic     Status: Abnormal   Collection Time: 01/11/22 11:11 AM  Result Value Ref Range   Color, Urine YELLOW (A) YELLOW   APPearance HAZY (A) CLEAR   Specific Gravity, Urine 1.024 1.005 - 1.030   pH 5.0 5.0 - 8.0   Glucose, UA NEGATIVE NEGATIVE mg/dL   Hgb urine dipstick SMALL (A) NEGATIVE   Bilirubin Urine NEGATIVE NEGATIVE   Ketones, ur NEGATIVE NEGATIVE mg/dL   Protein, ur NEGATIVE NEGATIVE mg/dL   Nitrite NEGATIVE NEGATIVE   Leukocytes,Ua NEGATIVE NEGATIVE   RBC / HPF 6-10 0 - 5 RBC/hpf   WBC, UA 6-10 0 - 5 WBC/hpf   Bacteria, UA RARE (A) NONE SEEN   Squamous Epithelial / LPF 0-5 0 - 5   Mucus PRESENT    Non Squamous Epithelial PRESENT (A) NONE SEEN    Comment: Performed at [facility],  [place], [place] [address]  Urine culture     Status: Abnormal   Collection Time: 01/11/22 11:11 AM   Specimen: Urine, Clean Catch  Result Value Ref Range   Specimen Description      URINE, CLEAN CATCH Performed at [facility], [address] [place], [place] [address]    Special Requests      NONE Performed at [facility], [place], [place] [address]    Culture 60,000 COLONIES/mL ESCHERICHIA COLI (A)    Report Status 01/13/2022 FINAL    Organism ID, Bacteria ESCHERICHIA COLI (A)       Susceptibility   Escherichia coli - MIC*    AMPICILLIN >=32 RESISTANT Resistant     CEFAZOLIN <=4 SENSITIVE Sensitive     CEFEPIME <=0.12 SENSITIVE Sensitive     CEFTRIAXONE <=0.25 SENSITIVE Sensitive      CIPROFLOXACIN <=0.25 SENSITIVE Sensitive     GENTAMICIN <=1 SENSITIVE Sensitive     IMIPENEM <=0.25 SENSITIVE Sensitive     NITROFURANTOIN <=16 SENSITIVE Sensitive     TRIMETH/SULFA <=20 SENSITIVE Sensitive     AMPICILLIN/SULBACTAM 16 INTERMEDIATE Intermediate     PIP/TAZO <=4 SENSITIVE Sensitive     * 60,000 COLONIES/mL ESCHERICHIA COLI    Assessment/Plan:  Acute deep vein thrombosis (DVT) of axillary vein of right upper extremity (HCC) The patient has a fairly extensive DVT of the right upper extremity associated with a Port-A-Cath with marked arm pain and swelling.  She is almost done using the Port-A-Cath for chemotherapy and has very difficult venous access.  That does complicate the situation but I think it would be appropriate to try to go ahead and perform thrombectomy acutely and we could expect this to markedly debulk the clot.  I would also recommend getting her Port-A-Cath out as soon as possible once she completes her chemotherapy.  Another option would be to take this Port-A-Cath out and move it to another site as well as the thrombectomy but I think since she only has 2 more treatments we can probably get by with anticoagulation for now.  I discussed the risk and benefits of the procedure.  I discussed the reason and rationale for treatment.  She voices her understanding and is agreeable to proceed and we will get this scheduled as soon as we can get insurance approval and get her on the schedule.  Hyperlipidemia lipid control important in reducing the progression of atherosclerotic disease. Continue statin therapy   Malignant neoplasm of upper-outer quadrant of left breast in female, estrogen receptor negative ([place]) Currently getting chemotherapy and has a Port-A-Cath in place.  Only 2 more treatments remaining.      [name] 01/15/2022, 3:22 PM   This note was created with Dragon medical transcription system.  Any errors from dictation are unintentional.

## 2022-01-15 NOTE — Assessment & Plan Note (Signed)
The patient has a fairly extensive DVT of the right upper extremity associated with a Port-A-Cath with marked arm pain and swelling.  She is almost done using the Port-A-Cath for chemotherapy and has very difficult venous access.  That does complicate the situation but I think it would be appropriate to try to go ahead and perform thrombectomy acutely and we could expect this to markedly debulk the clot.  I would also recommend getting her Port-A-Cath out as soon as possible once she completes her chemotherapy.  Another option would be to take this Port-A-Cath out and move it to another site as well as the thrombectomy but I think since she only has 2 more treatments we can probably get by with anticoagulation for now.  I discussed the risk and benefits of the procedure.  I discussed the reason and rationale for treatment.  She voices her understanding and is agreeable to proceed and we will get this scheduled as soon as we can get insurance approval and get her on the schedule.

## 2022-01-16 ENCOUNTER — Ambulatory Visit
Admission: RE | Admit: 2022-01-16 | Discharge: 2022-01-16 | Disposition: A | Discharge status: Home / Self Care | Payer: Medicare PPO | Attending: Vascular Surgery | Admitting: Vascular Surgery

## 2022-01-16 ENCOUNTER — Other Ambulatory Visit: Payer: Self-pay | Admitting: *Deleted

## 2022-01-16 ENCOUNTER — Encounter
Admission: RE | Disposition: A | Discharge status: Home / Self Care | Payer: Self-pay | Source: Home / Self Care | Attending: Vascular Surgery

## 2022-01-16 ENCOUNTER — Telehealth (INDEPENDENT_AMBULATORY_CARE_PROVIDER_SITE_OTHER): Payer: Self-pay | Admitting: Vascular Surgery

## 2022-01-16 ENCOUNTER — Encounter: Payer: Self-pay | Admitting: Vascular Surgery

## 2022-01-16 ENCOUNTER — Other Ambulatory Visit: Payer: Self-pay

## 2022-01-16 ENCOUNTER — Telehealth: Payer: Self-pay | Admitting: Medical Oncology

## 2022-01-16 DIAGNOSIS — Z95828 Presence of other vascular implants and grafts: Secondary | ICD-10-CM

## 2022-01-16 DIAGNOSIS — C50919 Malignant neoplasm of unspecified site of unspecified female breast: Secondary | ICD-10-CM | POA: Diagnosis not present

## 2022-01-16 DIAGNOSIS — I82A11 Acute embolism and thrombosis of right axillary vein: Secondary | ICD-10-CM | POA: Diagnosis not present

## 2022-01-16 DIAGNOSIS — I82621 Acute embolism and thrombosis of deep veins of right upper extremity: Secondary | ICD-10-CM | POA: Diagnosis not present

## 2022-01-16 DIAGNOSIS — Z171 Estrogen receptor negative status [ER-]: Secondary | ICD-10-CM

## 2022-01-16 DIAGNOSIS — I82409 Acute embolism and thrombosis of unspecified deep veins of unspecified lower extremity: Secondary | ICD-10-CM

## 2022-01-16 DIAGNOSIS — I871 Compression of vein: Secondary | ICD-10-CM

## 2022-01-16 DIAGNOSIS — C50912 Malignant neoplasm of unspecified site of left female breast: Secondary | ICD-10-CM

## 2022-01-16 DIAGNOSIS — I82B11 Acute embolism and thrombosis of right subclavian vein: Secondary | ICD-10-CM | POA: Diagnosis not present

## 2022-01-16 DIAGNOSIS — R600 Localized edema: Secondary | ICD-10-CM

## 2022-01-16 HISTORY — PX: PERIPHERAL VASCULAR THROMBECTOMY: CATH118306

## 2022-01-16 LAB — CREATININE, SERUM
Creatinine, Ser: 0.97 mg/dL (ref 0.44–1.00)
GFR, Estimated: >60 mL/min (ref >60)

## 2022-01-16 LAB — BUN: BUN: 21 mg/dL (ref 8–23)

## 2022-01-16 SURGERY — PERIPHERAL VASCULAR THROMBECTOMY
Anesthesia: Moderate Sedation | Laterality: Right

## 2022-01-16 MED ORDER — FENTANYL CITRATE (PF) 100 MCG/2ML IJ SOLN
INTRAMUSCULAR | Status: AC
  Filled 2022-01-16: qty 2

## 2022-01-16 MED ORDER — ONDANSETRON HCL 4 MG/2ML IJ SOLN: 4.0000 mg | Freq: Four times a day (QID) | INTRAMUSCULAR | Status: DC | PRN

## 2022-01-16 MED ORDER — CEFAZOLIN SODIUM-DEXTROSE 2-4 GM/100ML-% IV SOLN: 2.0000 g | INTRAVENOUS | Status: AC

## 2022-01-16 MED ORDER — MIDAZOLAM HCL 5 MG/5ML IJ SOLN
INTRAMUSCULAR | Status: AC
  Filled 2022-01-16: qty 5

## 2022-01-16 MED ORDER — SODIUM CHLORIDE 0.9 % IV SOLN: INTRAVENOUS | Status: DC

## 2022-01-16 MED ORDER — CEFAZOLIN SODIUM-DEXTROSE 2-4 GM/100ML-% IV SOLN
INTRAVENOUS | Status: AC
  Administered 2022-01-16: 2 g via INTRAVENOUS
  Filled 2022-01-16: qty 100

## 2022-01-16 MED ORDER — MIDAZOLAM HCL 2 MG/2ML IJ SOLN
INTRAMUSCULAR | Status: DC | PRN
  Administered 2022-01-16: 2 mg via INTRAVENOUS

## 2022-01-16 MED ORDER — FAMOTIDINE 20 MG PO TABS: 40.0000 mg | ORAL_TABLET | Freq: Once | ORAL | Status: DC | PRN

## 2022-01-16 MED ORDER — MIDAZOLAM HCL 2 MG/ML PO SYRP: 8.0000 mg | ORAL_SOLUTION | Freq: Once | ORAL | Status: DC | PRN

## 2022-01-16 MED ORDER — DIPHENHYDRAMINE HCL 50 MG/ML IJ SOLN: 50.0000 mg | Freq: Once | INTRAMUSCULAR | Status: DC | PRN

## 2022-01-16 MED ORDER — HEPARIN SODIUM (PORCINE) 1000 UNIT/ML IJ SOLN
INTRAMUSCULAR | Status: AC
  Filled 2022-01-16: qty 10

## 2022-01-16 MED ORDER — FENTANYL CITRATE (PF) 100 MCG/2ML IJ SOLN
INTRAMUSCULAR | Status: DC | PRN
  Administered 2022-01-16: 50 ug via INTRAVENOUS

## 2022-01-16 MED ORDER — HYDROMORPHONE HCL 1 MG/ML IJ SOLN: 1.0000 mg | Freq: Once | INTRAMUSCULAR | Status: DC | PRN

## 2022-01-16 MED ORDER — ALTEPLASE 2 MG IJ SOLR
INTRAMUSCULAR | Status: DC | PRN
  Administered 2022-01-16: 6 mg

## 2022-01-16 MED ORDER — METHYLPREDNISOLONE SODIUM SUCC 125 MG IJ SOLR: 125.0000 mg | Freq: Once | INTRAMUSCULAR | Status: DC | PRN

## 2022-01-16 SURGICAL SUPPLY — 14 items
BALLN DORADO 8X60X80 (BALLOONS) ×1
BALLOON DORADO 8X60X80 (BALLOONS) IMPLANT
CANISTER PENUMBRA ENGINE (MISCELLANEOUS) IMPLANT
CANNULA 5F STIFF (CANNULA) IMPLANT
CATH BEACON 5 .035 40 KMP TP (CATHETERS) IMPLANT
CATH BEACON 5 .038 40 KMP TP (CATHETERS) ×1
CATH INDIGO 7D KIT (CATHETERS) IMPLANT
DRAPE BRACHIAL (DRAPES) IMPLANT
GLIDEWIRE ADV .035X180CM (WIRE) IMPLANT
KIT ENCORE 26 ADVANTAGE (KITS) IMPLANT
PACK ANGIOGRAPHY (CUSTOM PROCEDURE TRAY) ×1 IMPLANT
SHEATH BRITE TIP 7FRX11 (SHEATH) IMPLANT
SHEATH BRITE TIP 8FRX11 (SHEATH) IMPLANT
SHEATH PROBE COVER 6X72 (BAG) IMPLANT

## 2022-01-16 NOTE — Discharge Instructions (Signed)
Venogram, Care After This sheet gives you information about how to care for yourself after your procedure. Your health care provider may also give you more specific instructions. If you have problems or questions, contact your health care provider. What can I expect after the procedure? After the procedure, it is common to have: Bruising or mild discomfort in the area where the IV was inserted (insertion site).  Follow these instructions at home: Eating and drinking Follow instructions from your health care provider about eating or drinking restrictions. Drink a lot of fluids for the first several days after the procedure, as directed by your health care provider. This helps to wash (flush) the contrast out of your body. Examples of healthy fluids include water or low-calorie drinks. General instructions Check your right arm venous site  for: Redness, swelling, or pain. Fluid or blood. Warmth. Pus or a bad smell. Take over-the-counter and prescription medicines only as told by your health care provider. Rest and return to your normal activities as told by your health care provider. Ask your health care provider what activities are safe for you. Do not drive for 24 hours if you were given a medicine to help you relax (sedative), or until your health care provider approves. Keep all follow-up visits as told by your health care provider. This is important. You may remove the dressing from your right arm site in 2 days No soaking in tub of water, pool or hot tub for one week to prevent infection. Contact a health care provider if: Your skin becomes itchy or you develop a rash or hives. You have a fever that does not get better with medicine. You feel nauseous. You vomit. You have redness, swelling, or pain around the insertion site. You have fluid or blood coming from the insertion site. Your insertion area feels warm to the touch. You have pus or a bad smell coming from the insertion  site. Get help right away if: You have difficulty breathing or shortness of breath. You develop chest pain. You faint. You feel very dizzy. These symptoms may represent a serious problem that is an emergency. Do not wait to see if the symptoms will go away. Get medical help right away. Call your local emergency services (911 in the [place] Do not drive yourself to the hospital. Summary After your procedure, it is common to have bruising or mild discomfort in the area where the IV was inserted. You should check your IV insertion area every day for signs of infection. Take over-the-counter and prescription medicines only as told by your health care provider. You should drink a lot of fluids for the first several days after the procedure to help flush the contrast from your body. This information is not intended to replace advice given to you by your health care provider. Make sure you discuss any questions you have with your health care provider. Document Released: 02/10/2013 Document Revised: 03/16/2016 Document Reviewed: 03/16/2016 Elsevier Interactive Patient Education  2017 [employer].

## 2022-01-16 NOTE — Op Note (Signed)
[facility]   OPERATIVE NOTE   PRE-OPERATIVE DIAGNOSIS: extensive right upper extremity DVT, left upper extremity breast cancer with right subclavian Port-A-Cath in place  POST-OPERATIVE DIAGNOSIS: same   PROCEDURE: 1.   US guidance for vascular access to right basilic vein 2.   Catheter placement into superior vena cava from right basilic vein  3.   Right upper extremity venogram and superior venacavogram 4.   Catheter directed thrombolysis with 6 mg of tPA to the right axillary and subclavian veins  5.   Mechanical thrombectomy to right axillary and subclavian veins with the penumbra CAT 7D catheter 6.   PTA of right subclavian vein with 8 mm balloon    SURGEON: [name], MD  ASSISTANT(S): none  ANESTHESIA: local with moderate conscious sedation for 34 minutes using 2 mg of Versed and 50 mcg of Fentanyl  ESTIMATED BLOOD LOSS: 200 cc  FINDING(S): 1.  Extensive thrombosis of the right axillary and subclavian veins with high-grade stenosis around the Port-A-Cath in the right subclavian vein.  Superior vena cava and innominate vein were patent.  SPECIMEN(S):  none  INDICATIONS:    Patient is a 66 y.o. female who presents with extensive thrombosis of the right axillary and subclavian vein up to a previously placed Port-A-Cath in the right subclavian vein.  Patient has marked arm swelling and pain.  Venous intervention is performed to reduce the symtpoms and avoid long term postphlebitic symptoms.    DESCRIPTION: After obtaining full informed written consent, the patient was brought back to the vascular suite and placed supine upon the table. Moderate conscious sedation was administered during a face to face encounter with the patient throughout the procedure with my supervision of the RN administering medicines and monitoring the patient's vital signs, pulse oximetry, telemetry and mental status throughout from the start of the procedure until the patient was  taken to the recovery room.  After obtaining adequate anesthesia, the patient was prepped and draped in the standard fashion.    The right basilic vein was found to be patent and was accessed at the antecubital fossa without difficulty with a micropuncture needle and a permanent image was recorded.  A micropuncture wire and sheath were then placed and upsized to a 7 [place] sheath.  Imaging was performed which showed very sluggish flow with thrombosis of the right axillary and subclavian veins.  I was able advance a Kumpe catheter and advantage wire across the thrombosis and into the superior vena cava.  The right innominate vein superior vena cava were patent without thrombosis.  There was clearly a very high-grade stenosis around the Port-A-Cath in the subclavian vein on the right.  I then pulled the Kumpe catheter back in and instilled 6 mg of tPA in the right axillary and subclavian veins.  This was allowed to dwell.  I then brought the penumbra CAT 7D catheter on the field and made multiple passes of the right axillary and subclavian veins with a large amount of thrombus removed.  Imaging showed only a small amount of residual thrombus but there remained a near occlusive stenosis in the right subclavian vein around the catheter.  Leaving the catheter in place for her next 2 chemotherapy treatments as planned so we knew we were not going to stent this area.  I did elect to balloon the area with a moderate sized 8 mm diameter by 6 cm length high-pressure angioplasty balloon inflated to 6 atm in the right subclavian vein.  This  resulted in improvement with decent flow channel now around the catheter.  There still appeared to be a greater than 50% stenosis, but it was not appropriate to stent this area with the port still in place and we are planning on removing this port in a few weeks as soon as she completes chemotherapy.  It could be treated definitively at that time.  I then elected to terminate the procedure.   The sheath was removed and a dressing was placed.  She was taken to the recovery room in stable condition having tolerated the procedure well.    COMPLICATIONS: None  CONDITION: Stable  [name] 01/16/2022 1:39 PM

## 2022-01-16 NOTE — Telephone Encounter (Signed)
Calling to check on patient. Left message stating that I was checking on her and hoped she was feeling better.

## 2022-01-17 ENCOUNTER — Other Ambulatory Visit: Payer: Self-pay

## 2022-01-17 ENCOUNTER — Encounter: Payer: Self-pay | Admitting: Vascular Surgery

## 2022-01-17 ENCOUNTER — Inpatient Hospital Stay: Payer: Medicare PPO | Admitting: Medical Oncology

## 2022-01-17 ENCOUNTER — Inpatient Hospital Stay: Payer: Medicare PPO

## 2022-01-17 VITALS — BP 135/77 | HR 75 | Temp 98.4°F | Resp 20 | Ht 64.0 in | Wt 182.0 lb

## 2022-01-17 DIAGNOSIS — C50412 Malignant neoplasm of upper-outer quadrant of left female breast: Secondary | ICD-10-CM | POA: Diagnosis not present

## 2022-01-17 DIAGNOSIS — I82621 Acute embolism and thrombosis of deep veins of right upper extremity: Secondary | ICD-10-CM

## 2022-01-17 DIAGNOSIS — Z5111 Encounter for antineoplastic chemotherapy: Secondary | ICD-10-CM | POA: Diagnosis not present

## 2022-01-17 DIAGNOSIS — Z171 Estrogen receptor negative status [ER-]: Secondary | ICD-10-CM

## 2022-01-17 DIAGNOSIS — Z79899 Other long term (current) drug therapy: Secondary | ICD-10-CM | POA: Diagnosis not present

## 2022-01-17 DIAGNOSIS — Z95828 Presence of other vascular implants and grafts: Secondary | ICD-10-CM | POA: Diagnosis not present

## 2022-01-17 DIAGNOSIS — C50919 Malignant neoplasm of unspecified site of unspecified female breast: Secondary | ICD-10-CM

## 2022-01-17 DIAGNOSIS — R600 Localized edema: Secondary | ICD-10-CM

## 2022-01-17 DIAGNOSIS — Z5189 Encounter for other specified aftercare: Secondary | ICD-10-CM | POA: Diagnosis not present

## 2022-01-17 LAB — COMPREHENSIVE METABOLIC PANEL
ALT: 26 U/L (ref 0–44)
AST: 29 U/L (ref 15–41)
Albumin: 3.6 g/dL (ref 3.5–5.0)
Alkaline Phosphatase: 100 U/L (ref 38–126)
Anion gap: 5 (ref 5–15)
BUN: 21 mg/dL (ref 8–23)
CO2: 23 mmol/L (ref 22–32)
Calcium: 8.6 mg/dL — ABNORMAL LOW (ref 8.9–10.3)
Chloride: 110 mmol/L (ref 98–111)
Creatinine, Ser: 0.83 mg/dL (ref 0.44–1.00)
GFR, Estimated: >60 mL/min (ref >60)
Glucose, Bld: 101 mg/dL — ABNORMAL HIGH (ref 70–99)
Potassium: 4.4 mmol/L (ref 3.5–5.1)
Sodium: 138 mmol/L (ref 135–145)
Total Bilirubin: 0.3 mg/dL (ref 0.3–1.2)
Total Protein: 6.5 g/dL (ref 6.5–8.1)

## 2022-01-17 LAB — CBC WITH DIFFERENTIAL/PLATELET
Abs Immature Granulocytes: 0.21 10*3/uL — ABNORMAL HIGH (ref 0.00–0.07)
Basophils Absolute: 0.1 10*3/uL (ref 0.0–0.1)
Basophils Relative: 1 %
Eosinophils Absolute: 0 10*3/uL (ref 0.0–0.5)
Eosinophils Relative: 0 %
HCT: 30.9 % — ABNORMAL LOW (ref 36.0–46.0)
Hemoglobin: 10 g/dL — ABNORMAL LOW (ref 12.0–15.0)
Immature Granulocytes: 2 %
Lymphocytes Relative: 12 %
Lymphs Abs: 1.5 10*3/uL (ref 0.7–4.0)
MCH: 30.3 pg (ref 26.0–34.0)
MCHC: 32.4 g/dL (ref 30.0–36.0)
MCV: 93.6 fL (ref 80.0–100.0)
Monocytes Absolute: 0.9 10*3/uL (ref 0.1–1.0)
Monocytes Relative: 7 %
Neutro Abs: 9.9 10*3/uL — ABNORMAL HIGH (ref 1.7–7.7)
Neutrophils Relative %: 78 %
Platelets: 115 10*3/uL — ABNORMAL LOW (ref 150–400)
RBC: 3.3 MIL/uL — ABNORMAL LOW (ref 3.87–5.11)
RDW: 15.5 % (ref 11.5–15.5)
WBC: 12.6 10*3/uL — ABNORMAL HIGH (ref 4.0–10.5)
nRBC: 0.4 % — ABNORMAL HIGH (ref 0.0–0.2)

## 2022-01-17 NOTE — Progress Notes (Signed)
Symptom Management [place] at [facility] [phone] Fax:([phone]) [phone]  Patient Care Team: Tower, Wynelle Fanny, MD as PCP - General Daiva Huge, RN as Oncology Nurse Navigator   Name of the patient: Megan Harrison  [id number]  22-Sep-1955   Date of visit: 01/17/22  Reason for Consult: Megan Harrison is a 66 y.o. female with history of stage Ia left breast cancer who presents today for:  Right Arm Swelling: See on 01/14/2022 for a 1 day history of right arm edema. STAT US showed multiple significant DVT of the right arm extending into axilla and subclavian. Though to be complication of her port. She was started on Eliquis and referred to vascular surgery. She underwent a thrombectomy on 01/16/2022 and presents today to discuss Eliquis tolerance and to assess her symptoms.   She reports that she is overall feeling ok. The right arm is still swollen- as she was told to expect following the surgery). She has a bruise of the incision area but without any active bleeding or wide expansion of bruise. Tingling in hand is slightly better. Arm still heavy and full. No chest pain, SOB, calf pain. Tolerating her Eliquis well without bleeding episodes of side effects.   Denies any neurologic complaints.  Patient offers no further specific complaints today.   PAST MEDICAL HISTORY: Past Medical History:  Diagnosis Date   Anemia    Cutaneous sarcoidosis    Degenerative disc disease    Diverticulosis    Dysplastic nevus 11/19/2017   L mid back 6.0 cm lat to spine   Foot pain    GERD (gastroesophageal reflux disease)    Hyperlipidemia    Malignant neoplasm of upper-outer quadrant of left breast in female, estrogen receptor negative ([place]) 11/04/2021   Migraine    Shingles    eyes    PAST SURGICAL HISTORY:  Past Surgical History:  Procedure Laterality Date   2D Echo  1999   Negative   ABDOMINAL HYSTERECTOMY  03/1998   APPENDECTOMY     Arm  lesion, cutaneous sarcoid  12/2005   with normal CXR and ANA   BREAST BIOPSY Left 10/23/2021   US biopsy/ heart clip/ [facility]   BREAST LUMPECTOMY WITH RADIOFREQUENCY TAG IDENTIFICATION Left 62/13/0865   lt br rf tag placement   BREAST LUMPECTOMY,RADIO FREQ LOCALIZER,AXILLARY SENTINEL LYMPH NODE BIOPSY Left 11/20/2021   Procedure: BREAST LUMPECTOMY,RADIO FREQ LOCALIZER,AXILLARY SENTINEL LYMPH NODE BIOPSY;  Surgeon: Olean Ree, MD;  Location: [facility];  Service: General;  Laterality: Left;   CESAREAN SECTION     x2   COLONOSCOPY  11/0/2009,08/18/13   diverticulosis and hemorrhoids   DIAGNOSTIC LAPAROSCOPY     EYE SURGERY  10/13/2018   had shingles in left eye, had to have surgery, has contact lens in now.   FOOT NEUROMA SURGERY Left    LAPAROSCOPIC APPENDECTOMY N/A 02/18/2016   Procedure: APPENDECTOMY LAPAROSCOPIC;  Surgeon: Ralene Ok, MD;  Location: [place];  Service: General;  Laterality: N/A;   LUMBAR LAMINECTOMY  10/2017   PERIPHERAL VASCULAR THROMBECTOMY Right 01/16/2022   Procedure: PERIPHERAL VASCULAR THROMBECTOMY;  Surgeon: Algernon Huxley, MD;  Location: [place] [facility];  Service: Cardiovascular;  Laterality: Right;   PORTACATH PLACEMENT Right 12/05/2021   Procedure: INSERTION PORT-A-CATH;  Surgeon: Olean Ree, MD;  Location: [facility];  Service: General;  Laterality: Right;   ROTATOR CUFF REPAIR Left    TONSILLECTOMY     UPPER GASTROINTESTINAL ENDOSCOPY  11/15/2010   non-erosive gastritis,  H. pylori negative    HEMATOLOGY/ONCOLOGY HISTORY:  Oncology History  Malignant neoplasm of upper-outer quadrant of left breast in female, estrogen receptor negative ([place])  11/04/2021 Initial Diagnosis   Malignant neoplasm of upper-outer quadrant of left breast in female, estrogen receptor negative ([place])   11/04/2021 Cancer Staging   Staging form: Breast, AJCC 8th Edition - Clinical stage from 11/04/2021: Stage IB (cT1b, cN0, cM0, G3, ER-, PR-, HER2-) - Signed by Sindy Guadeloupe, MD on  11/04/2021 Histologic grading system: 3 grade system   11/30/2021 Cancer Staging   Staging form: Breast, AJCC 8th Edition - Pathologic stage from 11/30/2021: Stage IB (pT1b, pN0(sn), cM0, G3, ER-, PR-, HER2-) - Signed by Sindy Guadeloupe, MD on 11/30/2021 Method of lymph node assessment: Sentinel lymph node biopsy Multigene prognostic tests performed: None Histologic grading system: 3 grade system   12/12/2021 - 12/14/2021 Chemotherapy   Patient is on Treatment Plan : BREAST TC q21d     01/02/2022 -  Chemotherapy   Patient is on Treatment Plan : BREAST TC q21d       ALLERGIES:  is allergic to codeine and sumatriptan.  MEDICATIONS:  Current Outpatient Medications  Medication Sig Dispense Refill   acetaminophen (TYLENOL) 500 MG tablet Take 2 tablets (1,000 mg total) by mouth every 6 (six) hours as needed for mild pain.     APIXABAN (ELIQUIS) VTE STARTER PACK (10MG AND 5MG) Take as directed on package: start with two-45m tablets twice daily for 7 days. On day 8, switch to one-570mtablet twice daily. Stop 81 mg aspirin and Mobic while on this medication 1 each 0   atorvastatin (LIPITOR) 10 MG tablet Take 1 tablet (10 mg total) by mouth daily. 90 tablet 3   calcium carbonate (TUMS - DOSED IN MG ELEMENTAL CALCIUM) 500 MG chewable tablet Chew 1 tablet by mouth as needed for indigestion or heartburn.     Cholecalciferol (VITAMIN D3) 50 MCG (2000 UT) TABS Take 1 tablet by mouth daily.     ferrous sulfate 325 (65 FE) MG tablet Take 325 mg by mouth daily.     fluticasone (FLONASE) 50 MCG/ACT nasal spray USE ONE SPRAY(S) IN EACH NOSTRIL ONCE DAILY AS DIRECTED (Patient taking differently: 1 spray at bedtime. USE ONE SPRAY(S) IN EACH NOSTRIL ONCE DAILY AS DIRECTED) 48 g 3   gabapentin (NEURONTIN) 300 MG capsule Take 300 mg by mouth daily after supper.     Magnesium Hydroxide (DULCOLAX PO) Take 1 tablet by mouth 2 (two) times daily.     Multiple Vitamins-Minerals (ONE-A-DAY WOMENS 50 PLUS PO) Take 1 tablet by  mouth daily.     prednisoLONE acetate (PRED FORTE) 1 % ophthalmic suspension Place 1 drop into the left eye every other day.     Timolol Maleate 0.5 % (DAILY) SOLN Place 1 drop into the left eye daily at 6 (six) AM.     No current facility-administered medications for this visit.   Facility-Administered Medications Ordered in Other Visits  Medication Dose Route Frequency Provider Last Rate Last Admin   heparin lock flush 100 UNIT/ML injection            palonosetron (ALOXI) 0.25 MG/5ML injection             VITAL SIGNS: BP 135/77   Pulse 75   Temp 98.4 F (36.9 C) (Tympanic)   Resp 20   Ht 5' 4"  (1.626 m)   Wt 182 lb (82.6 kg)   BMI 31.24 kg/m  [employer]  01/17/22 1057  Weight: 182 lb (82.6 kg)    Estimated body mass index is 31.24 kg/m as calculated from the following:   Height as of this encounter: 5' 4"  (1.626 m).   Weight as of this encounter: 182 lb (82.6 kg).  LABS: CBC:    Component Value Date/Time   WBC 12.6 (H) 01/17/2022 1032   HGB 10.0 (L) 01/17/2022 1032   HCT 30.9 (L) 01/17/2022 1032   PLT 115 (L) 01/17/2022 1032   MCV 93.6 01/17/2022 1032   NEUTROABS 9.9 (H) 01/17/2022 1032   LYMPHSABS 1.5 01/17/2022 1032   MONOABS 0.9 01/17/2022 1032   EOSABS 0.0 01/17/2022 1032   BASOSABS 0.1 01/17/2022 1032   Comprehensive Metabolic Panel:    Component Value Date/Time   NA 138 01/17/2022 1032   K 4.4 01/17/2022 1032   CL 110 01/17/2022 1032   CO2 23 01/17/2022 1032   BUN 21 01/17/2022 1032   CREATININE 0.83 01/17/2022 1032   GLUCOSE 101 (H) 01/17/2022 1032   CALCIUM 8.6 (L) 01/17/2022 1032   AST 29 01/17/2022 1032   ALT 26 01/17/2022 1032   ALKPHOS 100 01/17/2022 1032   BILITOT 0.3 01/17/2022 1032   PROT 6.5 01/17/2022 1032   ALBUMIN 3.6 01/17/2022 1032    RADIOGRAPHIC STUDIES: PERIPHERAL VASCULAR CATHETERIZATION  Result Date: 01/16/2022 See surgical note for result.  US Venous Img Upper Uni Right  Result Date: 01/14/2022 CLINICAL DATA:   Right upper extremity edema EXAM: Right UPPER EXTREMITY VENOUS DOPPLER ULTRASOUND TECHNIQUE: Gray-scale sonography with graded compression, as well as color Doppler and duplex ultrasound were performed to evaluate the upper extremity deep venous system from the level of the subclavian vein and including the jugular, axillary, basilic, radial, ulnar and upper cephalic vein. Spectral Doppler was utilized to evaluate flow at rest and with distal augmentation maneuvers. COMPARISON:  None Available. FINDINGS: Contralateral Subclavian Vein: Respiratory phasicity is normal and asymmetric with the symptomatic side. No evidence of thrombus. Normal compressibility. Internal Jugular Vein: No evidence of thrombus. Normal compressibility, respiratory phasicity and response to augmentation. Subclavian Vein: Noncompressible with nearly completely occlusive thrombus. Axillary Vein: Noncompressible with nearly completely occlusive thrombus. Cephalic Vein: Noncompressible in the proximal portion with nearly completely occlusive thrombus. Patent with no evidence of thrombus in the mid and distal portions with normal compressibility, respiratory phasicity, and response to augmentation. Basilic Vein: Noncompressible with nonocclusive thrombus in the proximal portion. Patent in the mid and distal portions Brachial Veins: 1 of the paired brachial veins is noncompressible with occlusive thrombus throughout. Radial Veins: No evidence of thrombus. Normal compressibility, respiratory phasicity and response to augmentation. Ulnar Veins: No evidence of thrombus. Normal compressibility, respiratory phasicity and response to augmentation. Venous Reflux:  None visualized. Other Findings:  None visualized. IMPRESSION: 1. Nearly occlusive DVT within 1 of the paired right brachial veins extending into the right axillary and subclavian veins. 2. Near occlusive thrombi within the proximal cephalic and proximal basilic veins. Critical Value/emergent  results were called by telephone at the time of interpretation on 01/14/2022 at 3:19 pm to provider [facility] , who verbally acknowledged these results. Electronically Signed   By: Darrin Nipper M.D.   On: 01/14/2022 15:20    PERFORMANCE STATUS (ECOG) : 1 - Symptomatic but completely ambulatory  Review of Systems Unless otherwise noted, a complete review of systems is negative.  Physical Exam General: NAD Cardiovascular: regular rate and rhythm. Radial pulse 2+ bilaterally.  Pulmonary: clear ant fields Abdomen: soft, nontender, + bowel sounds  GU: no suprapubic tenderness Extremities: Moderate edema of the right arm, no joint deformities, no erythema, increased warmth, rash, sign of infection, dusky or pale discoloration.  Skin: no rashes Neurological: Weakness but otherwise nonfocal   Assessment and Plan- Patient is a 66 y.o. female  Encounter Diagnoses  Name Primary?   Malignant neoplasm of upper-outer quadrant of left breast in female, estrogen receptor negative ([place])    Port-A-Cath in place    Acute embolism and thrombosis of deep vein of right upper extremity ([place]) Yes   She is healing well and tolerating her Eliquis well. Hbg is stable considering she just had vascular surgery yesterday. Reviewed red flag signs and symptoms. She will continue to elevate the arm as she is able. Continue follow up with Vascular. In terms of her Port, the plan is to keep this in place until after her last chemotherapy and then have it removed soon thereafter seeing as she only has two chemotherapies left and has a history of very poor venous access. Continue normal follow up interval with our office.   Thank you for allowing me to participate in the care of this very pleasant patient.   Time Total: 15  Visit consisted of counseling and education dealing with the complex and emotionally intense issues of symptom management in the setting of serious illness.Greater than 50%  of this time was spent  counseling and coordinating care related to the above assessment and plan.  Signed by: Nelwyn Salisbury, PA-C

## 2022-01-22 MED FILL — Dexamethasone Sodium Phosphate Inj 100 MG/10ML: INTRAMUSCULAR | Qty: 1 | Status: AC

## 2022-01-23 ENCOUNTER — Inpatient Hospital Stay: Payer: Medicare PPO

## 2022-01-23 ENCOUNTER — Inpatient Hospital Stay: Payer: Medicare PPO | Admitting: Oncology

## 2022-01-23 ENCOUNTER — Encounter: Payer: Self-pay | Admitting: Oncology

## 2022-01-23 VITALS — BP 123/90 | HR 73 | Temp 97.8°F | Resp 18 | Wt 185.5 lb

## 2022-01-23 DIAGNOSIS — C50412 Malignant neoplasm of upper-outer quadrant of left female breast: Secondary | ICD-10-CM

## 2022-01-23 DIAGNOSIS — Z5111 Encounter for antineoplastic chemotherapy: Secondary | ICD-10-CM | POA: Diagnosis not present

## 2022-01-23 DIAGNOSIS — T451X5A Adverse effect of antineoplastic and immunosuppressive drugs, initial encounter: Secondary | ICD-10-CM | POA: Diagnosis not present

## 2022-01-23 DIAGNOSIS — D6481 Anemia due to antineoplastic chemotherapy: Secondary | ICD-10-CM

## 2022-01-23 DIAGNOSIS — Z171 Estrogen receptor negative status [ER-]: Secondary | ICD-10-CM | POA: Diagnosis not present

## 2022-01-23 DIAGNOSIS — D75839 Thrombocytosis, unspecified: Secondary | ICD-10-CM

## 2022-01-23 DIAGNOSIS — Z5189 Encounter for other specified aftercare: Secondary | ICD-10-CM | POA: Diagnosis not present

## 2022-01-23 DIAGNOSIS — I82621 Acute embolism and thrombosis of deep veins of right upper extremity: Secondary | ICD-10-CM | POA: Diagnosis not present

## 2022-01-23 DIAGNOSIS — Z79899 Other long term (current) drug therapy: Secondary | ICD-10-CM | POA: Diagnosis not present

## 2022-01-23 LAB — CBC WITH DIFFERENTIAL/PLATELET
Abs Immature Granulocytes: 0.06 10*3/uL (ref 0.00–0.07)
Basophils Absolute: 0 10*3/uL (ref 0.0–0.1)
Basophils Relative: 0 %
Eosinophils Absolute: 0 10*3/uL (ref 0.0–0.5)
Eosinophils Relative: 0 %
HCT: 27.8 % — ABNORMAL LOW (ref 36.0–46.0)
Hemoglobin: 9.3 g/dL — ABNORMAL LOW (ref 12.0–15.0)
Immature Granulocytes: 1 %
Lymphocytes Relative: 6 %
Lymphs Abs: 0.6 10*3/uL — ABNORMAL LOW (ref 0.7–4.0)
MCH: 31.4 pg (ref 26.0–34.0)
MCHC: 33.5 g/dL (ref 30.0–36.0)
MCV: 93.9 fL (ref 80.0–100.0)
Monocytes Absolute: 0.4 10*3/uL (ref 0.1–1.0)
Monocytes Relative: 4 %
Neutro Abs: 9.7 10*3/uL — ABNORMAL HIGH (ref 1.7–7.7)
Neutrophils Relative %: 89 %
Platelets: 531 10*3/uL — ABNORMAL HIGH (ref 150–400)
RBC: 2.96 MIL/uL — ABNORMAL LOW (ref 3.87–5.11)
RDW: 16.4 % — ABNORMAL HIGH (ref 11.5–15.5)
WBC: 10.8 10*3/uL — ABNORMAL HIGH (ref 4.0–10.5)
nRBC: 0 % (ref 0.0–0.2)

## 2022-01-23 LAB — COMPREHENSIVE METABOLIC PANEL
ALT: 21 U/L (ref 0–44)
AST: 28 U/L (ref 15–41)
Albumin: 3.5 g/dL (ref 3.5–5.0)
Alkaline Phosphatase: 65 U/L (ref 38–126)
Anion gap: 6 (ref 5–15)
BUN: 18 mg/dL (ref 8–23)
CO2: 23 mmol/L (ref 22–32)
Calcium: 8.9 mg/dL (ref 8.9–10.3)
Chloride: 109 mmol/L (ref 98–111)
Creatinine, Ser: 0.61 mg/dL (ref 0.44–1.00)
GFR, Estimated: >60 mL/min (ref >60)
Glucose, Bld: 189 mg/dL — ABNORMAL HIGH (ref 70–99)
Potassium: 3.7 mmol/L (ref 3.5–5.1)
Sodium: 138 mmol/L (ref 135–145)
Total Bilirubin: 0.3 mg/dL (ref 0.3–1.2)
Total Protein: 6.5 g/dL (ref 6.5–8.1)

## 2022-01-23 MED ORDER — SODIUM CHLORIDE 0.9 % IV SOLN
75.0000 mg/m2 | Freq: Once | INTRAVENOUS | Status: AC
  Administered 2022-01-23: 140 mg via INTRAVENOUS
  Filled 2022-01-23: qty 14

## 2022-01-23 MED ORDER — SODIUM CHLORIDE 0.9 % IV SOLN
600.0000 mg/m2 | Freq: Once | INTRAVENOUS | Status: AC
  Administered 2022-01-23: 1140 mg via INTRAVENOUS
  Filled 2022-01-23: qty 57

## 2022-01-23 MED ORDER — SODIUM CHLORIDE 0.9 % IV SOLN
10.0000 mg | Freq: Once | INTRAVENOUS | Status: AC
  Administered 2022-01-23: 10 mg via INTRAVENOUS
  Filled 2022-01-23: qty 10

## 2022-01-23 MED ORDER — PANTOPRAZOLE SODIUM 20 MG PO TBEC: 20.0000 mg | DELAYED_RELEASE_TABLET | Freq: Every day | ORAL | 2 refills | Status: DC

## 2022-01-23 MED ORDER — SODIUM CHLORIDE 0.9 % IV SOLN
Freq: Once | INTRAVENOUS | Status: AC
  Filled 2022-01-23: qty 250

## 2022-01-23 MED ORDER — SODIUM CHLORIDE 0.9% FLUSH
10.0000 mL | INTRAVENOUS | Status: DC | PRN
  Administered 2022-01-23: 10 mL
  Filled 2022-01-23: qty 10

## 2022-01-23 MED ORDER — HEPARIN SOD (PORK) LOCK FLUSH 100 UNIT/ML IV SOLN
500.0000 [IU] | Freq: Once | INTRAVENOUS | Status: AC | PRN
  Administered 2022-01-23: 500 [IU]
  Filled 2022-01-23: qty 5

## 2022-01-23 MED ORDER — PALONOSETRON HCL INJECTION 0.25 MG/5ML
0.2500 mg | Freq: Once | INTRAVENOUS | Status: AC
  Administered 2022-01-23: 0.25 mg via INTRAVENOUS
  Filled 2022-01-23: qty 5

## 2022-01-23 NOTE — Progress Notes (Signed)
Pt states when going to sleep her rt arm swells up alot and has to sleep with it laying up near her head; saw Judson Roch and was sent for a [place] that showed a blood clot in rt arm.

## 2022-01-23 NOTE — Patient Instructions (Signed)
[facility] [place]  Discharge Instructions: Thank you for choosing [facility] to provide your oncology and hematology care.  If you have a lab appointment with the Cancer Center, please go directly to the Cancer Center and check in at the registration area.  Wear comfortable clothing and clothing appropriate for easy access to any Portacath or PICC line.   We strive to give you quality time with your provider. You may need to reschedule your appointment if you arrive late (15 or more minutes).  Arriving late affects you and other patients whose appointments are after yours.  Also, if you miss three or more appointments without notifying the office, you may be dismissed from the clinic at the provider's discretion.      For prescription refill requests, have your pharmacy contact our office and allow 72 hours for refills to be completed.    Today you received the following chemotherapy and/or immunotherapy agents Taxotere, Cytoxan      To help prevent nausea and vomiting after your treatment, we encourage you to take your nausea medication as directed.  BELOW ARE SYMPTOMS THAT SHOULD BE REPORTED IMMEDIATELY: *FEVER GREATER THAN 100.4 F (38 C) OR HIGHER *CHILLS OR SWEATING *NAUSEA AND VOMITING THAT IS NOT CONTROLLED WITH YOUR NAUSEA MEDICATION *UNUSUAL SHORTNESS OF BREATH *UNUSUAL BRUISING OR BLEEDING *URINARY PROBLEMS (pain or burning when urinating, or frequent urination) *BOWEL PROBLEMS (unusual diarrhea, constipation, pain near the anus) TENDERNESS IN MOUTH AND THROAT WITH OR WITHOUT PRESENCE OF ULCERS (sore throat, sores in mouth, or a toothache) UNUSUAL RASH, SWELLING OR PAIN  UNUSUAL VAGINAL DISCHARGE OR ITCHING   Items with * indicate a potential emergency and should be followed up as soon as possible or go to the Emergency Department if any problems should occur.  Please show the CHEMOTHERAPY ALERT CARD or IMMUNOTHERAPY ALERT CARD at  check-in to the Emergency Department and triage nurse.  Should you have questions after your visit or need to cancel or reschedule your appointment, please contact [facility] [place]  [phone] and follow the prompts.  Office hours are 8:00 a.m. to 4:30 p.m. Monday - Friday. Please note that voicemails left after 4:00 p.m. may not be returned until the following business day.  We are closed weekends and major holidays. You have access to a nurse at all times for urgent questions. Please call the main number to the clinic [phone] and follow the prompts.  For any non-urgent questions, you may also contact your provider using MyChart. We now offer e-Visits for anyone 18 and older to request care online for non-urgent symptoms. For details visit [email or web address].   Also download the MyChart app! Go to the app store, search "MyChart", open the app, select [place], and log in with your MyChart username and password.  Masks are optional in the cancer centers. If you would like for your care team to wear a mask while they are taking care of you, please let them know. For doctor visits, patients may have with them one support person who is at least 68 years old. At this time, visitors are not allowed in the infusion area.    

## 2022-01-23 NOTE — Progress Notes (Signed)
Hematology/Oncology Consult note [facility]  Telephone:([phone] [phone]  Patient Care Team: Tower, Wynelle Fanny, MD as PCP - General Daiva Huge, RN as Oncology Nurse Navigator   Name of the patient: Megan Harrison  [id number]  [birth year]   Date of visit: 01/23/22  Diagnosis- pathological prognostic stage Ia invasive mammary carcinoma of the left breast pT1b N0 M0 ER/PR HER2 negative  Chief complaint/ Reason for visit-on treatment assessment prior to cycle 3 of adjuvant TC chemotherapy  Heme/Onc history:  Patient is a 66 year old female who underwent a screening bilateral mammogram in May 2023 which showed a possible asymmetry in the left breast.  This was followed by diagnostic mammogram and ultrasound which showed a hypoechoic mass in the left breast 3 x 4 x 6 mm.  No abnormality noted in the left axillary lymph nodes.  This was biopsied and was consistent with invasive mammary carcinoma 4 mm grade 3 ER negative less than 1%, PR negative less than 1% and HER2 negative.   Final pathology showed 8 mm grade 3 triple negative tumor with negative margins.3 sentinel lymph nodes negative for malignancy.  Plan is for adjuvant TC chemotherapy x4 cycles followed by radiation  Patient on to have extensive right upper extremity DVT related to port placement after 2 cycles of chemotherapy.  She is currently on Eliquis and also saw vascular surgery and s/p thrombolysis and thrombectomy  Interval history-patient reports that the pain and swelling in her right upper extremity is going down but it is still swollen as compared to the left.  Does complain of significant heartburn  ECOG PS- 1 Pain scale- 0   Review of systems- Review of Systems  Constitutional:  Negative for chills, fever, malaise/fatigue and weight loss.  HENT:  Negative for congestion, ear discharge and nosebleeds.   Eyes:  Negative for blurred vision.  Respiratory:  Negative for cough,  hemoptysis, sputum production, shortness of breath and wheezing.   Cardiovascular:  Negative for chest pain, palpitations, orthopnea and claudication.  Gastrointestinal:  Negative for abdominal pain, blood in stool, constipation, diarrhea, heartburn, melena, nausea and vomiting.  Genitourinary:  Negative for dysuria, flank pain, frequency, hematuria and urgency.  Musculoskeletal:  Negative for back pain, joint pain and myalgias.       Right upper extremity is more swollen than the left  Skin:  Negative for rash.  Neurological:  Negative for dizziness, tingling, focal weakness, seizures, weakness and headaches.  Endo/Heme/Allergies:  Does not bruise/bleed easily.  Psychiatric/Behavioral:  Negative for depression and suicidal ideas. The patient does not have insomnia.       Allergies  Allergen Reactions   Codeine     REACTION: syncope   Sumatriptan     REACTION: choking sensation     Past Medical History:  Diagnosis Date   Anemia    Cutaneous sarcoidosis    Degenerative disc disease    Diverticulosis    Dysplastic nevus 11/19/2017   L mid back 6.0 cm lat to spine   Foot pain    GERD (gastroesophageal reflux disease)    Hyperlipidemia    Malignant neoplasm of upper-outer quadrant of left breast in female, estrogen receptor negative ([place]) 11/04/2021   Migraine    Shingles    eyes     Past Surgical History:  Procedure Laterality Date   2D Echo  1999   Negative   ABDOMINAL HYSTERECTOMY  03/1998   APPENDECTOMY     Arm lesion, cutaneous sarcoid  12/2005   with normal CXR and ANA   BREAST BIOPSY Left 10/23/2021   [facility] biopsy/ heart clip/ [facility]   BREAST LUMPECTOMY WITH RADIOFREQUENCY TAG IDENTIFICATION Left 93/23/5573   lt br rf tag placement   BREAST LUMPECTOMY,RADIO FREQ LOCALIZER,AXILLARY SENTINEL LYMPH NODE BIOPSY Left 11/20/2021   Procedure: BREAST LUMPECTOMY,RADIO FREQ LOCALIZER,AXILLARY SENTINEL LYMPH NODE BIOPSY;  Surgeon: Olean Ree, MD;  Location: [facility];   Service: General;  Laterality: Left;   CESAREAN SECTION     x2   COLONOSCOPY  11/0/2009,08/18/13   diverticulosis and hemorrhoids   DIAGNOSTIC LAPAROSCOPY     EYE SURGERY  10/13/2018   had shingles in left eye, had to have surgery, has contact lens in now.   FOOT NEUROMA SURGERY Left    LAPAROSCOPIC APPENDECTOMY N/A 02/18/2016   Procedure: APPENDECTOMY LAPAROSCOPIC;  Surgeon: Ralene Ok, MD;  Location: [place];  Service: General;  Laterality: N/A;   LUMBAR LAMINECTOMY  10/2017   PERIPHERAL VASCULAR THROMBECTOMY Right 01/16/2022   Procedure: PERIPHERAL VASCULAR THROMBECTOMY;  Surgeon: Algernon Huxley, MD;  Location: [place] [facility];  Service: Cardiovascular;  Laterality: Right;   PORTACATH PLACEMENT Right 12/05/2021   Procedure: INSERTION PORT-A-CATH;  Surgeon: Olean Ree, MD;  Location: [facility];  Service: General;  Laterality: Right;   ROTATOR CUFF REPAIR Left    TONSILLECTOMY     UPPER GASTROINTESTINAL ENDOSCOPY  11/15/2010   non-erosive gastritis, H. pylori negative    Social History   Socioeconomic History   Marital status: Married    Spouse name: Not on file   Number of children: Not on file   Years of education: Not on file   Highest education level: Not on file  Occupational History   Occupation: Works for [employer]    Employer: RETIRED  Tobacco Use   Smoking status: Never   Smokeless tobacco: Never  Vaping Use   Vaping Use: Never used  Substance and Sexual Activity   Alcohol use: No    Alcohol/week: 0.0 standard drinks of alcohol   Drug use: No   Sexual activity: Yes  Other Topics Concern   Not on file  Social History Narrative   Regular exercise:  Curves, elliptical, walking.   Social Determinants of Health   Financial Resource Strain: Low Risk  (02/01/2020)   Overall Financial Resource Strain (CARDIA)    Difficulty of Paying Living Expenses: Not hard at all  Food Insecurity: No Food Insecurity (02/01/2020)   Hunger Vital Sign    Worried About  Running Out of Food in the Last Year: Never true    Ran Out of Food in the Last Year: Never true  Transportation Needs: No Transportation Needs (02/01/2020)   PRAPARE - Hydrologist (Medical): No    Lack of Transportation (Non-Medical): No  Physical Activity: Insufficiently Active (02/01/2020)   Exercise Vital Sign    Days of Exercise per Week: 4 days    Minutes of Exercise per Session: 30 min  Stress: No Stress Concern Present (02/01/2020)   [place]    Feeling of Stress : Not at all  Social Connections: Not on file  Intimate Partner Violence: Not At Risk (02/01/2020)   Humiliation, Afraid, Rape, and Kick questionnaire    Fear of Current or Ex-Partner: No    Emotionally Abused: No    Physically Abused: No    Sexually Abused: No    Family History  Problem Relation Age of  Onset   Colon cancer Brother        early 40's   Stomach cancer Brother    Heart disease Mother        CAD   Alcohol abuse Mother    Lung cancer Father    Heart disease Brother        CAD   Throat cancer Brother    Heart disease Brother        CAD, pre diabetic, obese   Colon polyps Sister    Breast cancer Neg Hx    Rectal cancer Neg Hx      Current Outpatient Medications:    acetaminophen (TYLENOL) 500 MG tablet, Take 2 tablets (1,000 mg total) by mouth every 6 (six) hours as needed for mild pain., Disp: , Rfl:    APIXABAN (ELIQUIS) VTE STARTER PACK (10MG AND 5MG), Take as directed on package: start with two-51m tablets twice daily for 7 days. On day 8, switch to one-532mtablet twice daily. Stop 81 mg aspirin and Mobic while on this medication, Disp: 1 each, Rfl: 0   atorvastatin (LIPITOR) 10 MG tablet, Take 1 tablet (10 mg total) by mouth daily., Disp: 90 tablet, Rfl: 3   calcium carbonate (TUMS - DOSED IN MG ELEMENTAL CALCIUM) 500 MG chewable tablet, Chew 1 tablet by mouth as needed for indigestion or  heartburn., Disp: , Rfl:    Cholecalciferol (VITAMIN D3) 50 MCG (2000 UT) TABS, Take 1 tablet by mouth daily., Disp: , Rfl:    ferrous sulfate 325 (65 FE) MG tablet, Take 325 mg by mouth daily., Disp: , Rfl:    fluticasone (FLONASE) 50 MCG/ACT nasal spray, USE ONE SPRAY(S) IN EACH NOSTRIL ONCE DAILY AS DIRECTED (Patient taking differently: 1 spray at bedtime. USE ONE SPRAY(S) IN EACH NOSTRIL ONCE DAILY AS DIRECTED), Disp: 48 g, Rfl: 3   gabapentin (NEURONTIN) 300 MG capsule, Take 300 mg by mouth daily after supper., Disp: , Rfl:    Magnesium Hydroxide (DULCOLAX PO), Take 1 tablet by mouth 2 (two) times daily., Disp: , Rfl:    Multiple Vitamins-Minerals (ONE-A-DAY WOMENS 50 PLUS PO), Take 1 tablet by mouth daily., Disp: , Rfl:    pantoprazole (PROTONIX) 20 MG tablet, Take 1 tablet (20 mg total) by mouth daily., Disp: 30 tablet, Rfl: 2   prednisoLONE acetate (PRED FORTE) 1 % ophthalmic suspension, Place 1 drop into the left eye every other day., Disp: , Rfl:    Timolol Maleate 0.5 % (DAILY) SOLN, Place 1 drop into the left eye daily at 6 (six) AM., Disp: , Rfl:  No current facility-administered medications for this visit.  Facility-Administered Medications Ordered in Other Visits:    cyclophosphamide (CYTOXAN) 1,140 mg in sodium chloride 0.9 % 250 mL chemo infusion, 600 mg/m2 (Treatment Plan Recorded), Intravenous, Once, RaSindy GuadeloupeMD, Last Rate: 614 mL/hr at 01/23/22 1228, 1,140 mg at 01/23/22 1228   heparin lock flush 100 UNIT/ML injection, , , ,    heparin lock flush 100 unit/mL, 500 Units, Intracatheter, Once PRN, RaSindy GuadeloupeMD   palonosetron (ALOXI) 0.25 MG/5ML injection, , , ,    sodium chloride flush (NS) 0.9 % injection 10 mL, 10 mL, Intracatheter, PRN, RaSindy GuadeloupeMD  Physical exam:  Vitals:   01/23/22 0928  BP: (!) 123/90  Pulse: 73  Resp: 18  Temp: 97.8 F (36.6 C)  SpO2: 99%  Weight: 185 lb 8 oz (84.1 kg)   Physical Exam Cardiovascular:     Rate and  Rhythm:  Normal rate and regular rhythm.     Heart sounds: Normal heart sounds.  Pulmonary:     Effort: Pulmonary effort is normal.     Breath sounds: Normal breath sounds.  Abdominal:     General: Bowel sounds are normal.     Palpations: Abdomen is soft.  Skin:    General: Skin is warm and dry.  Neurological:     Mental Status: She is alert and oriented to person, place, and time.         Latest Ref Rng & Units 01/23/2022    9:05 AM  CMP  Glucose 70 - 99 mg/dL 189   BUN 8 - 23 mg/dL 18   Creatinine 0.44 - 1.00 mg/dL 0.61   Sodium 135 - 145 mmol/L 138   Potassium 3.5 - 5.1 mmol/L 3.7   Chloride 98 - 111 mmol/L 109   CO2 22 - 32 mmol/L 23   Calcium 8.9 - 10.3 mg/dL 8.9   Total Protein 6.5 - 8.1 g/dL 6.5   Total Bilirubin 0.3 - 1.2 mg/dL 0.3   Alkaline Phos 38 - 126 U/L 65   AST 15 - 41 U/L 28   ALT 0 - 44 U/L 21       Latest Ref Rng & Units 01/23/2022    9:05 AM  CBC  WBC 4.0 - 10.5 K/uL 10.8   Hemoglobin 12.0 - 15.0 g/dL 9.3   Hematocrit 36.0 - 46.0 % 27.8   Platelets 150 - 400 K/uL 531     No images are attached to the encounter.  PERIPHERAL VASCULAR CATHETERIZATION  Result Date: 01/16/2022 See surgical note for result.  US Venous Img Upper Uni Right  Result Date: 01/14/2022 CLINICAL DATA:  Right upper extremity edema EXAM: Right UPPER EXTREMITY VENOUS DOPPLER ULTRASOUND TECHNIQUE: Gray-scale sonography with graded compression, as well as color Doppler and duplex ultrasound were performed to evaluate the upper extremity deep venous system from the level of the subclavian vein and including the jugular, axillary, basilic, radial, ulnar and upper cephalic vein. Spectral Doppler was utilized to evaluate flow at rest and with distal augmentation maneuvers. COMPARISON:  None Available. FINDINGS: Contralateral Subclavian Vein: Respiratory phasicity is normal and asymmetric with the symptomatic side. No evidence of thrombus. Normal compressibility. Internal Jugular Vein: No evidence  of thrombus. Normal compressibility, respiratory phasicity and response to augmentation. Subclavian Vein: Noncompressible with nearly completely occlusive thrombus. Axillary Vein: Noncompressible with nearly completely occlusive thrombus. Cephalic Vein: Noncompressible in the proximal portion with nearly completely occlusive thrombus. Patent with no evidence of thrombus in the mid and distal portions with normal compressibility, respiratory phasicity, and response to augmentation. Basilic Vein: Noncompressible with nonocclusive thrombus in the proximal portion. Patent in the mid and distal portions Brachial Veins: 1 of the paired brachial veins is noncompressible with occlusive thrombus throughout. Radial Veins: No evidence of thrombus. Normal compressibility, respiratory phasicity and response to augmentation. Ulnar Veins: No evidence of thrombus. Normal compressibility, respiratory phasicity and response to augmentation. Venous Reflux:  None visualized. Other Findings:  None visualized. IMPRESSION: 1. Nearly occlusive DVT within 1 of the paired right brachial veins extending into the right axillary and subclavian veins. 2. Near occlusive thrombi within the proximal cephalic and proximal basilic veins. Critical Value/emergent results were called by telephone at the time of interpretation on 01/14/2022 at 3:19 pm to provider [facility] , who verbally acknowledged these results. Electronically Signed   By: Darrin Nipper M.D.   On: 01/14/2022 15:20  Assessment and plan- Patient is a 66 y.o. female  with stage I triple negative left breast cancer pT1b N0 M0.  She is here for on treatment assessment prior to cycle 3 of adjuvant TC chemotherapy  Counts okay to proceed with cycle 3 of adjuvant TC chemotherapy today with Udenyca on day 3.  I will see her back in 3 weeks for cycle 4 which would be her last cycle.  I am referring her to radiation oncology at this time for consideration of adjuvant radiation therapy  upon completion of chemo.  Right extremity DVT: Port related.  Port is still in place until she completes chemo and can be taken out during the week of October 23 after neutropenia improves.  Continue Eliquis.  She follows up with vascular surgery  Thrombocytosis: Likely reactive.  Continue to monitor  Chemo-induced anemia: We will check ferritin and iron studies B12 and folate with a neck set of labs  Heartburn: We will start Protonix   Visit Diagnosis 1. Encounter for antineoplastic chemotherapy   2. Malignant neoplasm of upper-outer quadrant of left breast in female, estrogen receptor negative ([place])   3. Acute deep vein thrombosis (DVT) of brachial vein of right upper extremity ([facility])   4. Thrombocytosis   5. Antineoplastic chemotherapy induced anemia      Dr. Randa Evens, MD, MPH [facility] [phone] 01/23/2022 12:42 PM

## 2022-01-25 ENCOUNTER — Inpatient Hospital Stay: Payer: Medicare PPO

## 2022-01-25 DIAGNOSIS — Z5189 Encounter for other specified aftercare: Secondary | ICD-10-CM | POA: Diagnosis not present

## 2022-01-25 DIAGNOSIS — Z171 Estrogen receptor negative status [ER-]: Secondary | ICD-10-CM | POA: Diagnosis not present

## 2022-01-25 DIAGNOSIS — Z79899 Other long term (current) drug therapy: Secondary | ICD-10-CM | POA: Diagnosis not present

## 2022-01-25 DIAGNOSIS — C50412 Malignant neoplasm of upper-outer quadrant of left female breast: Secondary | ICD-10-CM | POA: Diagnosis not present

## 2022-01-25 DIAGNOSIS — Z5111 Encounter for antineoplastic chemotherapy: Secondary | ICD-10-CM | POA: Diagnosis not present

## 2022-01-25 MED ORDER — PEGFILGRASTIM-CBQV 6 MG/0.6ML ~~LOC~~ SOSY
6.0000 mg | PREFILLED_SYRINGE | Freq: Once | SUBCUTANEOUS | Status: AC
  Administered 2022-01-25: 6 mg via SUBCUTANEOUS
  Filled 2022-01-25: qty 0.6

## 2022-01-28 ENCOUNTER — Ambulatory Visit
Admission: RE | Admit: 2022-01-28 | Discharge: 2022-01-28 | Disposition: A | Discharge status: Home / Self Care | Payer: Medicare PPO | Source: Ambulatory Visit | Attending: Radiation Oncology | Admitting: Radiation Oncology

## 2022-01-28 ENCOUNTER — Encounter: Payer: Self-pay | Admitting: Radiation Oncology

## 2022-01-28 VITALS — BP 115/69 | HR 78 | Temp 98.4°F | Resp 18 | Ht 64.0 in | Wt 179.4 lb

## 2022-01-28 DIAGNOSIS — Z86718 Personal history of other venous thrombosis and embolism: Secondary | ICD-10-CM | POA: Insufficient documentation

## 2022-01-28 DIAGNOSIS — C50412 Malignant neoplasm of upper-outer quadrant of left female breast: Secondary | ICD-10-CM | POA: Insufficient documentation

## 2022-01-28 DIAGNOSIS — Z7982 Long term (current) use of aspirin: Secondary | ICD-10-CM | POA: Insufficient documentation

## 2022-01-28 DIAGNOSIS — Z171 Estrogen receptor negative status [ER-]: Secondary | ICD-10-CM | POA: Insufficient documentation

## 2022-01-28 DIAGNOSIS — Z791 Long term (current) use of non-steroidal anti-inflammatories (NSAID): Secondary | ICD-10-CM | POA: Diagnosis not present

## 2022-01-28 DIAGNOSIS — Z79899 Other long term (current) drug therapy: Secondary | ICD-10-CM | POA: Diagnosis not present

## 2022-01-28 DIAGNOSIS — E785 Hyperlipidemia, unspecified: Secondary | ICD-10-CM | POA: Insufficient documentation

## 2022-01-28 DIAGNOSIS — Z800 Family history of malignant neoplasm of digestive organs: Secondary | ICD-10-CM | POA: Insufficient documentation

## 2022-01-28 DIAGNOSIS — K219 Gastro-esophageal reflux disease without esophagitis: Secondary | ICD-10-CM | POA: Insufficient documentation

## 2022-01-28 DIAGNOSIS — Z170 Estrogen receptor positive status [ER+]: Secondary | ICD-10-CM | POA: Diagnosis not present

## 2022-01-28 DIAGNOSIS — Z801 Family history of malignant neoplasm of trachea, bronchus and lung: Secondary | ICD-10-CM | POA: Insufficient documentation

## 2022-01-28 DIAGNOSIS — Z9221 Personal history of antineoplastic chemotherapy: Secondary | ICD-10-CM | POA: Diagnosis not present

## 2022-01-28 DIAGNOSIS — Z7901 Long term (current) use of anticoagulants: Secondary | ICD-10-CM | POA: Diagnosis not present

## 2022-01-28 NOTE — Consult Note (Signed)
NEW PATIENT EVALUATION  Name: Megan Harrison  MRN: [id number]  Date:   01/28/2022     DOB: July 31, 1955   This 66 y.o. female patient presents to the clinic for initial evaluation of stage Ia (pT1b N0 M0) ER/PR and HER2/neu negative invasive mammary carcinoma of the left breast status post wide local excision and sentinel node biopsy and adjuvant chemotherapy.  REFERRING PHYSICIAN: Tower, Wynelle Fanny, MD  CHIEF COMPLAINT:  Chief Complaint  Patient presents with   Breast Cancer    DIAGNOSIS: The encounter diagnosis was Malignant neoplasm of upper-outer quadrant of left breast in female, estrogen receptor negative ([place]).   PREVIOUS INVESTIGATIONS:  Mammograms ultrasound reviewed Pathology reports reviewed Clinical notes reviewed  HPI: Patient is a 66 year old female who presented abnormal mammogram showing a 4 mm mass in the upper outer left breast.  This was confirmed on ultrasound to be hypoechoic mass measuring 3 x 4 x 6 mm 2 o'clock position 8 cm from the nipple.  She underwent targeted biopsy which was positive for invasive mammary carcinoma.  She went on to have a wide local excision and sentinel node biopsy for an 8 mm overall grade 3 triple negative invasive mammary carcinoma.  There was also ductal carcinoma and in situ present high-grade with comedonecrosis.  Margins were clear at 10 mm for the invasive component and 2 mm for the DCIS component.  2 sentinel lymph nodes were negative for metastatic disease.  She is currently completed 3 cycles of adjuvant TC and has 1 more cycle to go.  She is doing well.  Her care was complicated by a right upper extremity DVT which has been treated and she is currently on Eliquis.  She is status post thrombolysis and thrombectomy.  This was related to his prior port placement.  She otherwise specifically denies breast tenderness cough or bone pain.  PLANNED TREATMENT REGIMEN: Left hypofractionated whole breast radiation  PAST MEDICAL HISTORY:  has a  past medical history of Anemia, Cutaneous sarcoidosis, Degenerative disc disease, Diverticulosis, Dysplastic nevus (11/19/2017), Foot pain, GERD (gastroesophageal reflux disease), Hyperlipidemia, Malignant neoplasm of upper-outer quadrant of left breast in female, estrogen receptor negative ([place]) (11/04/2021), Migraine, and Shingles.    PAST SURGICAL HISTORY:  Past Surgical History:  Procedure Laterality Date   2D Echo  1999   Negative   ABDOMINAL HYSTERECTOMY  03/1998   APPENDECTOMY     Arm lesion, cutaneous sarcoid  12/2005   with normal CXR and ANA   BREAST BIOPSY Left 10/23/2021   [facility] biopsy/ heart clip/ [facility]   BREAST LUMPECTOMY WITH RADIOFREQUENCY TAG IDENTIFICATION Left 20/25/4270   lt br rf tag placement   BREAST LUMPECTOMY,RADIO FREQ LOCALIZER,AXILLARY SENTINEL LYMPH NODE BIOPSY Left 11/20/2021   Procedure: BREAST LUMPECTOMY,RADIO FREQ LOCALIZER,AXILLARY SENTINEL LYMPH NODE BIOPSY;  Surgeon: Olean Ree, MD;  Location: [facility];  Service: General;  Laterality: Left;   CESAREAN SECTION     x2   COLONOSCOPY  11/0/2009,08/18/13   diverticulosis and hemorrhoids   DIAGNOSTIC LAPAROSCOPY     EYE SURGERY  10/13/2018   had shingles in left eye, had to have surgery, has contact lens in now.   FOOT NEUROMA SURGERY Left    LAPAROSCOPIC APPENDECTOMY N/A 02/18/2016   Procedure: APPENDECTOMY LAPAROSCOPIC;  Surgeon: Ralene Ok, MD;  Location: [place];  Service: General;  Laterality: N/A;   LUMBAR LAMINECTOMY  10/2017   PERIPHERAL VASCULAR THROMBECTOMY Right 01/16/2022   Procedure: PERIPHERAL VASCULAR THROMBECTOMY;  Surgeon: Algernon Huxley, MD;  Location: [facility]  INVASIVE CV LAB;  Service: Cardiovascular;  Laterality: Right;   PORTACATH PLACEMENT Right 12/05/2021   Procedure: INSERTION PORT-A-CATH;  Surgeon: Olean Ree, MD;  Location: [facility];  Service: General;  Laterality: Right;   ROTATOR CUFF REPAIR Left    TONSILLECTOMY     UPPER GASTROINTESTINAL ENDOSCOPY  11/15/2010   non-erosive  gastritis, H. pylori negative    FAMILY HISTORY: family history includes Alcohol abuse in her mother; Colon cancer in her brother; Colon polyps in her sister; Heart disease in her brother, brother, and mother; Lung cancer in her father; Stomach cancer in her brother; Throat cancer in her brother.  SOCIAL HISTORY:  reports that she has never smoked. She has never used smokeless tobacco. She reports that she does not drink alcohol and does not use drugs.  ALLERGIES: Codeine and Sumatriptan  MEDICATIONS:  Current Outpatient Medications  Medication Sig Dispense Refill   acetaminophen (TYLENOL) 500 MG tablet Take 2 tablets (1,000 mg total) by mouth every 6 (six) hours as needed for mild pain.     APIXABAN (ELIQUIS) VTE STARTER PACK (10MG AND 5MG) Take as directed on package: start with two-16m tablets twice daily for 7 days. On day 8, switch to one-579mtablet twice daily. Stop 81 mg aspirin and Mobic while on this medication 1 each 0   atorvastatin (LIPITOR) 10 MG tablet Take 1 tablet (10 mg total) by mouth daily. 90 tablet 3   calcium carbonate (TUMS - DOSED IN MG ELEMENTAL CALCIUM) 500 MG chewable tablet Chew 1 tablet by mouth as needed for indigestion or heartburn.     Cholecalciferol (VITAMIN D3) 50 MCG (2000 UT) TABS Take 1 tablet by mouth daily.     ferrous sulfate 325 (65 FE) MG tablet Take 325 mg by mouth daily.     fluticasone (FLONASE) 50 MCG/ACT nasal spray USE ONE SPRAY(S) IN EACH NOSTRIL ONCE DAILY AS DIRECTED (Patient taking differently: 1 spray at bedtime. USE ONE SPRAY(S) IN EACH NOSTRIL ONCE DAILY AS DIRECTED) 48 g 3   gabapentin (NEURONTIN) 300 MG capsule Take 300 mg by mouth daily after supper.     Magnesium Hydroxide (DULCOLAX PO) Take 1 tablet by mouth 2 (two) times daily.     Multiple Vitamins-Minerals (ONE-A-DAY WOMENS 50 PLUS PO) Take 1 tablet by mouth daily.     pantoprazole (PROTONIX) 20 MG tablet Take 1 tablet (20 mg total) by mouth daily. 30 tablet 2   prednisoLONE  acetate (PRED FORTE) 1 % ophthalmic suspension Place 1 drop into the left eye every other day.     Timolol Maleate 0.5 % (DAILY) SOLN Place 1 drop into the left eye daily at 6 (six) AM.     No current facility-administered medications for this encounter.   Facility-Administered Medications Ordered in Other Encounters  Medication Dose Route Frequency Provider Last Rate Last Admin   heparin lock flush 100 UNIT/ML injection            palonosetron (ALOXI) 0.25 MG/5ML injection             ECOG PERFORMANCE STATUS:  0 - Asymptomatic  REVIEW OF SYSTEMS: Patient denies any weight loss, fatigue, weakness, fever, chills or night sweats. Patient denies any loss of vision, blurred vision. Patient denies any ringing  of the ears or hearing loss. No irregular heartbeat. Patient denies heart murmur or history of fainting. Patient denies any chest pain or pain radiating to her upper extremities. Patient denies any shortness of breath, difficulty breathing at night, cough or  hemoptysis. Patient denies any swelling in the lower legs. Patient denies any nausea vomiting, vomiting of blood, or coffee ground material in the vomitus. Patient denies any stomach pain. Patient states has had normal bowel movements no significant constipation or diarrhea. Patient denies any dysuria, hematuria or significant nocturia. Patient denies any problems walking, swelling in the joints or loss of balance. Patient denies any skin changes, loss of hair or loss of weight. Patient denies any excessive worrying or anxiety or significant depression. Patient denies any problems with insomnia. Patient denies excessive thirst, polyuria, polydipsia. Patient denies any swollen glands, patient denies easy bruising or easy bleeding. Patient denies any recent infections, allergies or URI. Patient "s visual fields have not changed significantly in recent time.   PHYSICAL EXAM: BP 115/69   Pulse 78   Temp 98.4 F (36.9 C) (Tympanic)   Resp 18    Ht 5' 4"  (1.626 m) Comment: stated Ht.  Wt 179 lb 6.4 oz (81.4 kg)   BMI 30.79 kg/m  She status post wide local excision of the left breast which is healed well no dominant masses noted in either breast no axillary or supraclavicular adenopathy is identified.  Her port is placed in the anterior right chest wall.  Well-developed well-nourished patient in NAD. HEENT reveals PERLA, EOMI, discs not visualized.  Oral cavity is clear. No oral mucosal lesions are identified. Neck is clear without evidence of cervical or supraclavicular adenopathy. Lungs are clear to A&P. Cardiac examination is essentially unremarkable with regular rate and rhythm without murmur rub or thrill. Abdomen is benign with no organomegaly or masses noted. Motor sensory and DTR levels are equal and symmetric in the upper and lower extremities. Cranial nerves II through XII are grossly intact. Proprioception is intact. No peripheral adenopathy or edema is identified. No motor or sensory levels are noted. Crude visual fields are within normal range.  LABORATORY DATA: Pathology reports reviewed    RADIOLOGY RESULTS: Mammogram and ultrasound reviewed compatible with above-stated findings   IMPRESSION: Stage Ia invasive mammary carcinoma of the left breast triple negative status post wide local excision and adjuvant chemotherapy in 66 year old female  PLAN: At this time elect to go ahead with hypofractionated whole breast radiation to her left breast.  Would treat her whole breast over 3 weeks boosting her scar another 1000 cGy using electron beam based on the close margin for DCIS component.  Risks and benefits of treatment including skin reaction fatigue alteration blood counts were reviewed.  We will use breath-holding technique to move her heart from her treatment field and spare any radiation to her cardiac tissue is much as possible.  Patient comprehends my recommendation well.  I would like to take this opportunity to thank you  for allowing me to participate in the care of your patient.Noreene Filbert, MD

## 2022-01-29 ENCOUNTER — Other Ambulatory Visit: Payer: Self-pay

## 2022-02-06 ENCOUNTER — Encounter (INDEPENDENT_AMBULATORY_CARE_PROVIDER_SITE_OTHER): Payer: Self-pay | Admitting: Vascular Surgery

## 2022-02-07 ENCOUNTER — Telehealth (INDEPENDENT_AMBULATORY_CARE_PROVIDER_SITE_OTHER): Payer: Self-pay

## 2022-02-07 NOTE — Telephone Encounter (Signed)
Spoke with the patient and she is scheduled with Dr. Lucky Cowboy on 02/28/22 with a 6:45 am arrival time to the [facility] for a port-a-cath removal. Pre-procedure instructions were discussed and will be mailed.

## 2022-02-11 ENCOUNTER — Ambulatory Visit: Payer: Medicare PPO

## 2022-02-12 ENCOUNTER — Ambulatory Visit (INDEPENDENT_AMBULATORY_CARE_PROVIDER_SITE_OTHER): Payer: Medicare PPO | Admitting: Vascular Surgery

## 2022-02-12 ENCOUNTER — Other Ambulatory Visit: Payer: Self-pay

## 2022-02-12 ENCOUNTER — Encounter (INDEPENDENT_AMBULATORY_CARE_PROVIDER_SITE_OTHER): Payer: Self-pay | Admitting: Vascular Surgery

## 2022-02-12 VITALS — BP 132/79 | HR 80 | Resp 16 | Wt 188.8 lb

## 2022-02-12 DIAGNOSIS — Z171 Estrogen receptor negative status [ER-]: Secondary | ICD-10-CM

## 2022-02-12 DIAGNOSIS — I82A11 Acute embolism and thrombosis of right axillary vein: Secondary | ICD-10-CM | POA: Diagnosis not present

## 2022-02-12 DIAGNOSIS — E7800 Pure hypercholesterolemia, unspecified: Secondary | ICD-10-CM

## 2022-02-12 DIAGNOSIS — C50412 Malignant neoplasm of upper-outer quadrant of left female breast: Secondary | ICD-10-CM | POA: Diagnosis not present

## 2022-02-12 MED FILL — Dexamethasone Sodium Phosphate Inj 100 MG/10ML: INTRAMUSCULAR | Qty: 1 | Status: AC

## 2022-02-12 NOTE — Assessment & Plan Note (Signed)
We again discussed the procedure and we will plan removal of her Port-A-Cath with a venogram and expected thrombectomy and right subclavian intervention on 10/26 at the request of her oncologist.  This would likely involve a brachial vein access as well as potentially rewiring her Port-A-Cath.  Risks and benefits of the procedure were discussed with the patient and she is agreeable to proceed.  She will continue her anticoagulation until that time and does not need to stop it.

## 2022-02-12 NOTE — H&P (View-Only) (Signed)
MRN : [id number]  Megan Harrison is a 66 y.o. (November 18, 1955) female who presents with chief complaint of  Chief Complaint  Patient presents with   Follow-up    Discuss port removal  .  History of Present Illness: Patient returns today in follow up of her right upper extremity DVT with a right subclavian Port-A-Cath in place.  This was placed by her general surgeon after her breast cancer resection on the left.  We performed a thrombectomy about a month ago which did improve her arm swelling a little but it still fairly significant and persistent.  We knew that her arm would not dramatically improved until we remove the port and treated her subclavian stenosis, but she only had 2 chemotherapy treatments left so we were trying to finish those.  She gets her last 1 of those this week and her oncologist wants her to wait until the week of October 23 to remove the port due to possible infectious issues with her diminished immune system after chemotherapy which is very reasonable.  She is tolerating anticoagulation.  She has noticed a little bit of lower extremity swelling too.  She has tolerated chemotherapy reasonably well.  Current Outpatient Medications  Medication Sig Dispense Refill   acetaminophen (TYLENOL) 500 MG tablet Take 2 tablets (1,000 mg total) by mouth every 6 (six) hours as needed for mild pain.     APIXABAN (ELIQUIS) VTE STARTER PACK ($RemoveBefor'10MG'HjDImlFKBUUi$  AND $Re'5MG'VNc$ ) Take as directed on package: start with two-$RemoveBefore'5mg'euYMiIGHyRmTD$  tablets twice daily for 7 days. On day 8, switch to one-$RemoveBefor'5mg'lZfodLAMzqgz$  tablet twice daily. Stop 81 mg aspirin and Mobic while on this medication 1 each 0   atorvastatin (LIPITOR) 10 MG tablet Take 1 tablet (10 mg total) by mouth daily. 90 tablet 3   calcium carbonate (TUMS - DOSED IN MG ELEMENTAL CALCIUM) 500 MG chewable tablet Chew 1 tablet by mouth as needed for indigestion or heartburn.     Cholecalciferol (VITAMIN D3) 50 MCG (2000 UT) TABS Take 1 tablet by mouth daily.     ferrous sulfate 325 (65  FE) MG tablet Take 325 mg by mouth daily.     fluticasone (FLONASE) 50 MCG/ACT nasal spray USE ONE SPRAY(S) IN EACH NOSTRIL ONCE DAILY AS DIRECTED (Patient taking differently: 1 spray at bedtime. USE ONE SPRAY(S) IN EACH NOSTRIL ONCE DAILY AS DIRECTED) 48 g 3   gabapentin (NEURONTIN) 300 MG capsule Take 300 mg by mouth daily after supper.     Magnesium Hydroxide (DULCOLAX PO) Take 1 tablet by mouth 2 (two) times daily.     Multiple Vitamins-Minerals (ONE-A-DAY WOMENS 50 PLUS PO) Take 1 tablet by mouth daily.     pantoprazole (PROTONIX) 20 MG tablet Take 1 tablet (20 mg total) by mouth daily. 30 tablet 2   prednisoLONE acetate (PRED FORTE) 1 % ophthalmic suspension Place 1 drop into the left eye every other day.     Timolol Maleate 0.5 % (DAILY) SOLN Place 1 drop into the left eye daily at 6 (six) AM.     No current facility-administered medications for this visit.   Facility-Administered Medications Ordered in Other Visits  Medication Dose Route Frequency Provider Last Rate Last Admin   heparin lock flush 100 UNIT/ML injection            palonosetron (ALOXI) 0.25 MG/5ML injection             Past Medical History:  Diagnosis Date   Anemia    Cutaneous sarcoidosis  Degenerative disc disease    Diverticulosis    Dysplastic nevus 11/19/2017   L mid back 6.0 cm lat to spine   Foot pain    GERD (gastroesophageal reflux disease)    Hyperlipidemia    Malignant neoplasm of upper-outer quadrant of left breast in female, estrogen receptor negative ([place]) 11/04/2021   Migraine    Shingles    eyes    Past Surgical History:  Procedure Laterality Date   2D Echo  1999   Negative   ABDOMINAL HYSTERECTOMY  03/1998   APPENDECTOMY     Arm lesion, cutaneous sarcoid  12/2005   with normal CXR and ANA   BREAST BIOPSY Left 10/23/2021   US biopsy/ heart clip/ [facility]   BREAST LUMPECTOMY WITH RADIOFREQUENCY TAG IDENTIFICATION Left 29/51/8841   lt br rf tag placement   BREAST LUMPECTOMY,RADIO FREQ  [place] BIOPSY Left 11/20/2021   Procedure: BREAST LUMPECTOMY,RADIO FREQ LOCALIZER,AXILLARY SENTINEL LYMPH NODE BIOPSY;  Surgeon: Olean Ree, MD;  Location: [facility];  Service: General;  Laterality: Left;   CESAREAN SECTION     x2   COLONOSCOPY  11/0/2009,08/18/13   diverticulosis and hemorrhoids   DIAGNOSTIC LAPAROSCOPY     EYE SURGERY  10/13/2018   had shingles in left eye, had to have surgery, has contact lens in now.   FOOT NEUROMA SURGERY Left    LAPAROSCOPIC APPENDECTOMY N/A 02/18/2016   Procedure: APPENDECTOMY LAPAROSCOPIC;  Surgeon: Ralene Ok, MD;  Location: [place];  Service: General;  Laterality: N/A;   LUMBAR LAMINECTOMY  10/2017   PERIPHERAL VASCULAR THROMBECTOMY Right 01/16/2022   Procedure: PERIPHERAL VASCULAR THROMBECTOMY;  Surgeon: Algernon Huxley, MD;  Location: [facility];  Service: Cardiovascular;  Laterality: Right;   PORTACATH PLACEMENT Right 12/05/2021   Procedure: INSERTION PORT-A-CATH;  Surgeon: Olean Ree, MD;  Location: [facility];  Service: General;  Laterality: Right;   ROTATOR CUFF REPAIR Left    TONSILLECTOMY     UPPER GASTROINTESTINAL ENDOSCOPY  11/15/2010   non-erosive gastritis, H. pylori negative     Social History   Tobacco Use   Smoking status: Never   Smokeless tobacco: Never  Vaping Use   Vaping Use: Never used  Substance Use Topics   Alcohol use: No    Alcohol/week: 0.0 standard drinks of alcohol   Drug use: No      Family History  Problem Relation Age of Onset   Colon cancer Brother        early 4's   Stomach cancer Brother    Heart disease Mother        CAD   Alcohol abuse Mother    Lung cancer Father    Heart disease Brother        CAD   Throat cancer Brother    Heart disease Brother        CAD, pre diabetic, obese   Colon polyps Sister    Breast cancer Neg Hx    Rectal cancer Neg Hx      Allergies  Allergen Reactions   Codeine     REACTION: syncope   Sumatriptan      REACTION: choking sensation      REVIEW OF SYSTEMS (Negative unless checked)   Constitutional: $RemoveBeforeDE'[]'mpSxONeBbLMCkuH$ Weight loss  $Rem'[]'bxFS$ Fever  $Remo'[]'nmDYw$ Chills Cardiac: $RemoveBeforeD'[]'EWIFevIxAASfMt$ Chest pain   '[]'$ Chest pressure   '[]'$ Palpitations   '[]'$ Shortness of breath when laying flat   '[]'$ Shortness of breath at rest   '[x]'$ Shortness of breath with exertion. Vascular:  $RemoveBe'[]'MnMhoUwfc$ Pain in legs  with walking   [] Pain in legs at rest   [] Pain in legs when laying flat   [] Claudication   [] Pain in feet when walking  [] Pain in feet at rest  [] Pain in feet when laying flat   [] History of DVT   [x] Phlebitis   [x] Swelling in legs   [] Varicose veins   [] Non-healing ulcers Pulmonary:   [] Uses home oxygen   [] Productive cough   [] Hemoptysis   [] Wheeze  [] COPD   [] Asthma Neurologic:  [] Dizziness  [] Blackouts   [] Seizures   [] History of stroke   [] History of TIA  [] Aphasia   [] Temporary blindness   [] Dysphagia   [] Weakness or numbness in arms   [] Weakness or numbness in legs Musculoskeletal:  [x] Arthritis   [] Joint swelling   [] Joint pain   [] Low back pain Hematologic:  [] Easy bruising  [] Easy bleeding   [] Hypercoagulable state   [x] Anemic  [] Hepatitis Gastrointestinal:  [] Blood in stool   [] Vomiting blood  [x] Gastroesophageal reflux/heartburn   [] Abdominal pain Genitourinary:  [] Chronic kidney disease   [] Difficult urination  [] Frequent urination  [] Burning with urination   [] Hematuria Skin:  [] Rashes   [] Ulcers   [] Wounds Psychological:  [] History of anxiety   []  History of major depression.    Physical Examination  BP 132/79 (BP Location: Right Arm)   Pulse 80   Resp 16   Wt 188 lb 12.8 oz (85.6 kg)   BMI 32.41 kg/m  Gen:  WD/WN, NAD Head: [place]/AT, No temporalis wasting. Ear/Nose/Throat: Hearing grossly intact, nares w/o erythema or drainage Eyes: Conjunctiva clear. Sclera non-icteric Neck: Supple.  Trachea midline Pulmonary:  Good air movement, no use of accessory muscles.  Cardiac: RRR, no JVD.  Port in place in right chest with right arm swelling  present Vascular:  Vessel Right Left  Radial Palpable Palpable                           Musculoskeletal: M/S 5/5 throughout.  No deformity or atrophy.  Trace lower extremity edema, 2+ right upper extremity edema. Neurologic: Sensation grossly intact in extremities.  Symmetrical.  Speech is fluent.  Psychiatric: Judgment intact, Mood & affect appropriate for pt's clinical situation. Dermatologic: No rashes or ulcers noted.  No cellulitis or open wounds.      Labs Recent Results (from the past 2160 hour(s))  Basic metabolic panel     Status: Abnormal   Collection Time: 11/16/21  2:52 PM  Result Value Ref Range   Sodium 141 135 - 145 mmol/L   Potassium 3.9 3.5 - 5.1 mmol/L   Chloride 106 98 - 111 mmol/L   CO2 28 22 - 32 mmol/L   Glucose, Bld 115 (H) 70 - 99 mg/dL    Comment: Glucose reference range applies only to samples taken after fasting for at least 8 hours.   BUN 20 8 - 23 mg/dL   Creatinine, Ser 0.77 0.44 - 1.00 mg/dL   Calcium 9.5 8.9 - 10.3 mg/dL   GFR, Estimated >60 >60 mL/min    Comment: (NOTE) Calculated using the CKD-EPI Creatinine Equation (2021)    Anion gap 7 5 - 15    Comment: Performed at [facility], [address] [place], [place] [address]  CBC     Status: None   Collection Time: 11/16/21  2:52 PM  Result Value Ref Range   WBC 8.9 4.0 - 10.5 K/uL   RBC 4.05 3.87 - 5.11 MIL/uL   Hemoglobin 12.2 12.0 -  15.0 g/dL   HCT 37.3 36.0 - 46.0 %   MCV 92.1 80.0 - 100.0 fL   MCH 30.1 26.0 - 34.0 pg   MCHC 32.7 30.0 - 36.0 g/dL   RDW 13.0 11.5 - 15.5 %   Platelets 317 150 - 400 K/uL   nRBC 0.0 0.0 - 0.2 %    Comment: Performed at [facility], [address] [place], [place] [address]  Surgical pathology     Status: None   Collection Time: 11/20/21  1:05 PM  Result Value Ref Range   SURGICAL PATHOLOGY      SURGICAL PATHOLOGY CASE: [id number] PATIENT: [facility] Surgical Pathology Report     Specimen Submitted: A.  Breast, left B. Sentinel node 1 C. Sentinel node 2  Clinical History: Left breast cancer    DIAGNOSIS: A. BREAST, LEFT; EXCISION: - INVASIVE MAMMARY CARCINOMA, NO SPECIAL TYPE. - DUCTAL CARCINOMA IN SITU, HIGH-GRADE WITH COMEDONECROSIS. - CLIP, RF TAG, AND BIOPSY SITE IDENTIFIED. - SEE CANCER SUMMARY.  B. LYMPH NODE, LEFT AXILLARY SENTINEL #1 (HOT/BLUE #771); EXCISION: - ONE LYMPH NODE, NEGATIVE FOR MALIGNANCY (0/1).  C. LYMPH NODE, LEFT AXILLARY SENTINEL #2; EXCISION: - ONE LYMPH NODE, NEGATIVE FOR MALIGNANCY (0/1).  CANCER CASE SUMMARY: INVASIVE CARCINOMA OF THE BREAST Standard(s): AJCC-UICC 8  SPECIMEN Procedure: Excision Specimen Laterality: Left  TUMOR Histologic Type: Invasive carcinoma of no special type (ductal) Histologic Grade (Nottingham Histologic Score)      Glandular (Acinar)/Tubular Differentiation: 3      Nuclear Ple omorphism: 3      Mitotic Rate: 3      Overall Grade: Grade 3 Tumor Size: 8 mm Tumor Focality: Single focus of invasive carcinoma Ductal Carcinoma In Situ (DCIS): Present, high-grade with comedonecrosis Tumor Extent: Not applicable Lymphatic and/or Vascular Invasion: Not identified Treatment Effect in the Breast: No known presurgical therapy  MARGINS Margin Status for Invasive Carcinoma: All margins negative for invasive carcinoma      Distance from closest margin: 10 mm      Specify closest margin: All surgical margins  Margin Status for DCIS: All margins negative for DCIS      Distance from DCIS to closest margin: 2 mm      Specify closest margin: Inferior, posterior  REGIONAL LYMPH NODES Regional Lymph Node Status: All regional lymph nodes negative for tumor      Total Number of Lymph Nodes Examined (sentinel and non-sentinel): 2       Number of Sentinel Nodes Examined: 2  DISTANT METASTASIS Distant Site(s) Involved, if applicable: Not applicable  PATHOLO GIC STAGE CLASSIFICATION (pTNM, AJCC 8th Edition): Modified  Classification: Not applicable pT Category: pT1b T Suffix: Not applicable pN Category: pN0 N Suffix: sn pM Category: Not applicable  SPECIAL STUDIES Breast Biomarker Testing Performed on Previous Biopsy: [phone] Estrogen Receptor (ER) Status: NEGATIVE (LESS THAN 1%)  Progesterone Receptor (PgR) Status: NEGATIVE (LESS THAN 1%)  HER2 (by immunohistochemistry): NEGATIVE (Score 0) Ki-67: Not performed  (v4.9.0.0)  GROSS DESCRIPTION: Intraoperative Consultation:     Labeled: Left breast mass     Received: Fresh     Specimen: Breast lumpectomy     Pathologic evaluation performed: Gross margin evaluation     Diagnosis: IOC: Mass with clip.  Margins grossly clear.     Communicated to: Called to Dr. Hampton Abbot at 1:16 PM on 11/20/2021 Quay Burow M.D.     Tissue submitted: None  A. Labeled: Left breast mass Received: Fresh Specimen radiograph image(s) available for review Radiographic findings:  A clip  and RF ID tag are present. Time in fixative: Collected at 1:05 PM on 11/20/2021 and placed in formalin at 1:16 PM on 11/20/2021 Cold ischemic time: Less than 30 minutes Total fixation time: Approximately 28 hours Type of procedure: Breast lumpectomy Location / laterality of specimen: Left breast Orientation of specimen: The specimen is received inked. Inking: Anterior = green Inferior = blue Lateral = orange Medial = yellow Posterior = black Superior = red Size of specimen: 5.8 (medial to lateral) x 5.2 (superior to inferior) x 3.1 (anterior to posterior) cm Skin: None grossly identified.  Biopsy site: There is a biopsy site which contains a heart-shaped clip.  Number of discrete masses: 1 Size of mass(es): 0.9 x 0.8 x 0.6 cm Description of mass(es): The mass is tan-white, firm and focally ill-defined. Distance between masses/clips: The clip is located within the mass. Margins: Anterior = 0.7 cm, superior = 1 cm, inferior = 0.9 cm, posterior = 1 cm, medial = 0. 7 cm,  lateral = 0.7 cm Description of remainder of tissue: The remainder of the specimen is comprised of yellow lobulated adipose tissue admixed with scattered areas of tan fibrous tissue.  The fat to fibrous tissue ratio is 90:10.  Block summary (approximately 30% of the mass remains and approximately 60% of the specimen remains.): 1 - 2 - medial margin, perpendicularly sectioned 3 - medial margin closest to mass 4 - 5 - representative sections of mass      4 - clip site with closest approach to anterior, inferior, and posterior margins      5 - additional anterior margin 6 - additional inferior and posterior margins to mass 7 - closest superior margins to mass 8 - closest lateral margins to mass 9 - 12 - lateral margin, perpendicularly sectioned  B. Labeled: Left sentinel node #1 (hot/blue 771) Received: Fresh Collection time: 11:54 AM on 11/20/2021 Placed into formalin time: 1:54 PM on 11/20/2021 Tissue fragment(s): 1 Size: 1.8 x 1.1 x 0.6 cm Description: Recei ved is a blue dyed lymph node candidate with a scant amount of surrounding adipose tissue.  The specimen is serially sectioned. Entirely submitted in 1 cassette.  C. Labeled: Sentinel node 2 Received: Fresh Collection time: 12:14 PM on 11/20/2021 Placed into formalin time: 1:54 PM on 11/20/2021 Tissue fragment(s): 1 Size: 1.2 x 1 x 0.5 cm Description: Received is a lymph node candidate with a scant amount of surrounding adipose tissue.  The specimen is bisected revealing central areas of adipose tissue. Entirely submitted in 1 cassette.  RB 11/21/2021   Final Diagnosis performed by Allena Napoleon, MD.   Electronically signed 11/22/2021 2:57:50PM The electronic signature indicates that the named Attending Pathologist has evaluated the specimen Technical component performed at [facility], [address], [place], [place] [address] Lab: [phone] Dir: Rush Farmer, MD, MMM  Professional component performed at [facility], [facility], [place], [place] , [place], [place] [address] Lab: [phone] Dir: Kathi Simpers, MD   CBC with Differential     Status: Abnormal   Collection Time: 12/12/21  9:07 AM  Result Value Ref Range   WBC 13.9 (H) 4.0 - 10.5 K/uL   RBC 4.05 3.87 - 5.11 MIL/uL   Hemoglobin 12.3 12.0 - 15.0 g/dL   HCT 37.2 36.0 - 46.0 %   MCV 91.9 80.0 - 100.0 fL   MCH 30.4 26.0 - 34.0 pg   MCHC 33.1 30.0 - 36.0 g/dL   RDW 12.9 11.5 - 15.5 %  Platelets 360 150 - 400 K/uL   nRBC 0.0 0.0 - 0.2 %   Neutrophils Relative % 84 %   Neutro Abs 11.8 (H) 1.7 - 7.7 K/uL   Lymphocytes Relative 11 %   Lymphs Abs 1.5 0.7 - 4.0 K/uL   Monocytes Relative 4 %   Monocytes Absolute 0.6 0.1 - 1.0 K/uL   Eosinophils Relative 0 %   Eosinophils Absolute 0.0 0.0 - 0.5 K/uL   Basophils Relative 0 %   Basophils Absolute 0.0 0.0 - 0.1 K/uL   Immature Granulocytes 1 %   Abs Immature Granulocytes 0.07 0.00 - 0.07 K/uL    Comment: Performed at [facility], [address] [place], [place] [address]  Comprehensive metabolic panel     Status: Abnormal   Collection Time: 12/12/21  9:07 AM  Result Value Ref Range   Sodium 140 135 - 145 mmol/L   Potassium 3.9 3.5 - 5.1 mmol/L   Chloride 106 98 - 111 mmol/L   CO2 24 22 - 32 mmol/L   Glucose, Bld 150 (H) 70 - 99 mg/dL    Comment: Glucose reference range applies only to samples taken after fasting for at least 8 hours.   BUN 23 8 - 23 mg/dL   Creatinine, Ser 0.65 0.44 - 1.00 mg/dL   Calcium 9.8 8.9 - 10.3 mg/dL   Total Protein 7.8 6.5 - 8.1 g/dL   Albumin 4.1 3.5 - 5.0 g/dL   AST 26 15 - 41 U/L   ALT 17 0 - 44 U/L   Alkaline Phosphatase 81 38 - 126 U/L   Total Bilirubin 0.3 0.3 - 1.2 mg/dL   GFR, Estimated >60 >60 mL/min    Comment: (NOTE) Calculated using the CKD-EPI Creatinine Equation (2021)    Anion gap 10 5 - 15    Comment: Performed at [facility], [address], [place], [place] [address]  Basic metabolic panel     Status:  Abnormal   Collection Time: 12/19/21 10:10 AM  Result Value Ref Range   Sodium 135 135 - 145 mmol/L   Potassium 3.8 3.5 - 5.1 mmol/L   Chloride 103 98 - 111 mmol/L   CO2 23 22 - 32 mmol/L   Glucose, Bld 121 (H) 70 - 99 mg/dL    Comment: Glucose reference range applies only to samples taken after fasting for at least 8 hours.   BUN 21 8 - 23 mg/dL   Creatinine, Ser 0.76 0.44 - 1.00 mg/dL   Calcium 8.7 (L) 8.9 - 10.3 mg/dL   GFR, Estimated >60 >60 mL/min    Comment: (NOTE) Calculated using the CKD-EPI Creatinine Equation (2021)    Anion gap 9 5 - 15    Comment: Performed at [facility], [address] [place], [place] [address]  CBC with Differential     Status: Abnormal   Collection Time: 12/19/21 10:10 AM  Result Value Ref Range   WBC 6.6 4.0 - 10.5 K/uL   RBC 4.06 3.87 - 5.11 MIL/uL   Hemoglobin 12.3 12.0 - 15.0 g/dL   HCT 37.2 36.0 - 46.0 %   MCV 91.6 80.0 - 100.0 fL   MCH 30.3 26.0 - 34.0 pg   MCHC 33.1 30.0 - 36.0 g/dL   RDW 12.8 11.5 - 15.5 %   Platelets 236 150 - 400 K/uL   nRBC 0.0 0.0 - 0.2 %   Neutrophils Relative % 12 %   Neutro Abs 0.8 (L) 1.7 - 7.7 K/uL  Lymphocytes Relative 29 %   Lymphs Abs 1.9 0.7 - 4.0 K/uL   Monocytes Relative 31 %   Monocytes Absolute 2.1 (H) 0.1 - 1.0 K/uL   Eosinophils Relative 2 %   Eosinophils Absolute 0.2 0.0 - 0.5 K/uL   Basophils Relative 1 %   Basophils Absolute 0.0 0.0 - 0.1 K/uL   WBC Morphology      MARKED LEFT SHIFT (>5% METAS,MYELOS AND PROS, OCC BLAST NOTED)    Comment: TOXIC GRANULATION   RBC Morphology MORPHOLOGY UNREMARKABLE    Smear Review Normal platelet morphology     Comment: PLATELETS APPEAR ADEQUATE   Immature Granulocytes 25 %    Comment: Increased IG's, likely caused by Bone Marrow Colony Stimulating Factor received within 30 days. UDENYCA ON 8.11.23    Abs Immature Granulocytes 1.63 (H) 0.00 - 0.07 K/uL    Comment: Performed at [facility], [address] [place], [place] [address]   Comprehensive metabolic panel     Status: Abnormal   Collection Time: 01/02/22  8:40 AM  Result Value Ref Range   Sodium 137 135 - 145 mmol/L   Potassium 3.8 3.5 - 5.1 mmol/L   Chloride 108 98 - 111 mmol/L   CO2 22 22 - 32 mmol/L   Glucose, Bld 190 (H) 70 - 99 mg/dL    Comment: Glucose reference range applies only to samples taken after fasting for at least 8 hours.   BUN 20 8 - 23 mg/dL   Creatinine, Ser 0.78 0.44 - 1.00 mg/dL   Calcium 9.3 8.9 - 10.3 mg/dL   Total Protein 6.9 6.5 - 8.1 g/dL   Albumin 3.7 3.5 - 5.0 g/dL   AST 30 15 - 41 U/L   ALT 19 0 - 44 U/L   Alkaline Phosphatase 73 38 - 126 U/L   Total Bilirubin 0.3 0.3 - 1.2 mg/dL   GFR, Estimated >60 >60 mL/min    Comment: (NOTE) Calculated using the CKD-EPI Creatinine Equation (2021)    Anion gap 7 5 - 15    Comment: Performed at [facility], [address] [place], [place] [address]  CBC with Differential     Status: Abnormal   Collection Time: 01/02/22  8:40 AM  Result Value Ref Range   WBC 13.0 (H) 4.0 - 10.5 K/uL   RBC 3.57 (L) 3.87 - 5.11 MIL/uL   Hemoglobin 11.0 (L) 12.0 - 15.0 g/dL   HCT 32.9 (L) 36.0 - 46.0 %   MCV 92.2 80.0 - 100.0 fL   MCH 30.8 26.0 - 34.0 pg   MCHC 33.4 30.0 - 36.0 g/dL   RDW 14.1 11.5 - 15.5 %   Platelets 504 (H) 150 - 400 K/uL   nRBC 0.0 0.0 - 0.2 %   Neutrophils Relative % 89 %   Neutro Abs 11.6 (H) 1.7 - 7.7 K/uL   Lymphocytes Relative 7 %   Lymphs Abs 0.9 0.7 - 4.0 K/uL   Monocytes Relative 3 %   Monocytes Absolute 0.4 0.1 - 1.0 K/uL   Eosinophils Relative 0 %   Eosinophils Absolute 0.0 0.0 - 0.5 K/uL   Basophils Relative 0 %   Basophils Absolute 0.0 0.0 - 0.1 K/uL   Immature Granulocytes 1 %   Abs Immature Granulocytes 0.08 (H) 0.00 - 0.07 K/uL    Comment: Performed at [facility], [address] [place], [place] [address]  Urinalysis, Complete w Microscopic     Status: Abnormal   Collection Time: 01/11/22  11:11 AM  Result Value Ref Range   Color, Urine  YELLOW (A) YELLOW   APPearance HAZY (A) CLEAR   Specific Gravity, Urine 1.024 1.005 - 1.030   pH 5.0 5.0 - 8.0   Glucose, UA NEGATIVE NEGATIVE mg/dL   Hgb urine dipstick SMALL (A) NEGATIVE   Bilirubin Urine NEGATIVE NEGATIVE   Ketones, ur NEGATIVE NEGATIVE mg/dL   Protein, ur NEGATIVE NEGATIVE mg/dL   Nitrite NEGATIVE NEGATIVE   Leukocytes,Ua NEGATIVE NEGATIVE   RBC / HPF 6-10 0 - 5 RBC/hpf   WBC, UA 6-10 0 - 5 WBC/hpf   Bacteria, UA RARE (A) NONE SEEN   Squamous Epithelial / LPF 0-5 0 - 5   Mucus PRESENT    Non Squamous Epithelial PRESENT (A) NONE SEEN    Comment: Performed at [facility], [address] [place], [place] [address]  Urine culture     Status: Abnormal   Collection Time: 01/11/22 11:11 AM   Specimen: Urine, Clean Catch  Result Value Ref Range   Specimen Description      URINE, CLEAN CATCH Performed at [facility], [address] [place], [place] [address]    Special Requests      NONE Performed at [facility], [address] [place], [place] [address]    Culture 60,000 COLONIES/mL ESCHERICHIA COLI (A)    Report Status 01/13/2022 FINAL    Organism ID, Bacteria ESCHERICHIA COLI (A)       Susceptibility   Escherichia coli - MIC*    AMPICILLIN >=32 RESISTANT Resistant     CEFAZOLIN <=4 SENSITIVE Sensitive     CEFEPIME <=0.12 SENSITIVE Sensitive     CEFTRIAXONE <=0.25 SENSITIVE Sensitive     CIPROFLOXACIN <=0.25 SENSITIVE Sensitive     GENTAMICIN <=1 SENSITIVE Sensitive     IMIPENEM <=0.25 SENSITIVE Sensitive     NITROFURANTOIN <=16 SENSITIVE Sensitive     TRIMETH/SULFA <=20 SENSITIVE Sensitive     AMPICILLIN/SULBACTAM 16 INTERMEDIATE Intermediate     PIP/TAZO <=4 SENSITIVE Sensitive     * 60,000 COLONIES/mL ESCHERICHIA COLI  BUN     Status: None   Collection Time: 01/16/22 12:36 PM  Result Value Ref Range   BUN 21 8 - 23 mg/dL    Comment: Performed at [facility], [address] [place], [place] [address]   Creatinine, serum     Status: None   Collection Time: 01/16/22 12:36 PM  Result Value Ref Range   Creatinine, Ser 0.97 0.44 - 1.00 mg/dL   GFR, Estimated >60 >60 mL/min    Comment: (NOTE) Calculated using the CKD-EPI Creatinine Equation (2021) Performed at [facility], [address] [place],  [address]   CBC with Differential     Status: Abnormal   Collection Time: 01/17/22 10:32 AM  Result Value Ref Range   WBC 12.6 (H) 4.0 - 10.5 K/uL   RBC 3.30 (L) 3.87 - 5.11 MIL/uL   Hemoglobin 10.0 (L) 12.0 - 15.0 g/dL   HCT 30.9 (L) 36.0 - 46.0 %   MCV 93.6 80.0 - 100.0 fL   MCH 30.3 26.0 - 34.0 pg   MCHC 32.4 30.0 - 36.0 g/dL   RDW 15.5 11.5 - 15.5 %   Platelets 115 (L) 150 - 400 K/uL   nRBC 0.4 (H) 0.0 - 0.2 %   Neutrophils Relative % 78 %   Neutro Abs 9.9 (H) 1.7 - 7.7 K/uL   Lymphocytes Relative 12 %   Lymphs Abs 1.5  0.7 - 4.0 K/uL   Monocytes Relative 7 %   Monocytes Absolute 0.9 0.1 - 1.0 K/uL   Eosinophils Relative 0 %   Eosinophils Absolute 0.0 0.0 - 0.5 K/uL   Basophils Relative 1 %   Basophils Absolute 0.1 0.0 - 0.1 K/uL   Immature Granulocytes 2 %   Abs Immature Granulocytes 0.21 (H) 0.00 - 0.07 K/uL    Comment: Performed at [facility], [address] [place], [place] [address]  Comprehensive metabolic panel     Status: Abnormal   Collection Time: 01/17/22 10:32 AM  Result Value Ref Range   Sodium 138 135 - 145 mmol/L   Potassium 4.4 3.5 - 5.1 mmol/L   Chloride 110 98 - 111 mmol/L   CO2 23 22 - 32 mmol/L   Glucose, Bld 101 (H) 70 - 99 mg/dL    Comment: Glucose reference range applies only to samples taken after fasting for at least 8 hours.   BUN 21 8 - 23 mg/dL   Creatinine, Ser 0.83 0.44 - 1.00 mg/dL   Calcium 8.6 (L) 8.9 - 10.3 mg/dL   Total Protein 6.5 6.5 - 8.1 g/dL   Albumin 3.6 3.5 - 5.0 g/dL   AST 29 15 - 41 U/L   ALT 26 0 - 44 U/L   Alkaline Phosphatase 100 38 - 126 U/L   Total Bilirubin 0.3 0.3 - 1.2 mg/dL   GFR,  Estimated >60 >60 mL/min    Comment: (NOTE) Calculated using the CKD-EPI Creatinine Equation (2021)    Anion gap 5 5 - 15    Comment: Performed at [facility], [address] [place], [place] [address]  Comprehensive metabolic panel     Status: Abnormal   Collection Time: 01/23/22  9:05 AM  Result Value Ref Range   Sodium 138 135 - 145 mmol/L   Potassium 3.7 3.5 - 5.1 mmol/L   Chloride 109 98 - 111 mmol/L   CO2 23 22 - 32 mmol/L   Glucose, Bld 189 (H) 70 - 99 mg/dL    Comment: Glucose reference range applies only to samples taken after fasting for at least 8 hours.   BUN 18 8 - 23 mg/dL   Creatinine, Ser 0.61 0.44 - 1.00 mg/dL   Calcium 8.9 8.9 - 10.3 mg/dL   Total Protein 6.5 6.5 - 8.1 g/dL   Albumin 3.5 3.5 - 5.0 g/dL   AST 28 15 - 41 U/L   ALT 21 0 - 44 U/L   Alkaline Phosphatase 65 38 - 126 U/L   Total Bilirubin 0.3 0.3 - 1.2 mg/dL   GFR, Estimated >60 >60 mL/min    Comment: (NOTE) Calculated using the CKD-EPI Creatinine Equation (2021)    Anion gap 6 5 - 15    Comment: Performed at [facility], [address] [place], [place] [address]  CBC with Differential     Status: Abnormal   Collection Time: 01/23/22  9:05 AM  Result Value Ref Range   WBC 10.8 (H) 4.0 - 10.5 K/uL   RBC 2.96 (L) 3.87 - 5.11 MIL/uL   Hemoglobin 9.3 (L) 12.0 - 15.0 g/dL   HCT 27.8 (L) 36.0 - 46.0 %   MCV 93.9 80.0 - 100.0 fL   MCH 31.4 26.0 - 34.0 pg   MCHC 33.5 30.0 - 36.0 g/dL   RDW 16.4 (H) 11.5 - 15.5 %   Platelets 531 (H) 150 - 400 K/uL   nRBC 0.0 0.0 - 0.2 %  Neutrophils Relative % 89 %   Neutro Abs 9.7 (H) 1.7 - 7.7 K/uL   Lymphocytes Relative 6 %   Lymphs Abs 0.6 (L) 0.7 - 4.0 K/uL   Monocytes Relative 4 %   Monocytes Absolute 0.4 0.1 - 1.0 K/uL   Eosinophils Relative 0 %   Eosinophils Absolute 0.0 0.0 - 0.5 K/uL   Basophils Relative 0 %   Basophils Absolute 0.0 0.0 - 0.1 K/uL   Immature Granulocytes 1 %   Abs Immature Granulocytes 0.06 0.00 - 0.07 K/uL     Comment: Performed at [facility], [address] [place], [place] [address]    Radiology PERIPHERAL VASCULAR CATHETERIZATION  Result Date: 01/16/2022 See surgical note for result.  US Venous Img Upper Uni Right  Result Date: 01/14/2022 CLINICAL DATA:  Right upper extremity edema EXAM: Right UPPER EXTREMITY VENOUS DOPPLER ULTRASOUND TECHNIQUE: Gray-scale sonography with graded compression, as well as color Doppler and duplex ultrasound were performed to evaluate the upper extremity deep venous system from the level of the subclavian vein and including the jugular, axillary, basilic, radial, ulnar and upper cephalic vein. Spectral Doppler was utilized to evaluate flow at rest and with distal augmentation maneuvers. COMPARISON:  None Available. FINDINGS: Contralateral Subclavian Vein: Respiratory phasicity is normal and asymmetric with the symptomatic side. No evidence of thrombus. Normal compressibility. Internal Jugular Vein: No evidence of thrombus. Normal compressibility, respiratory phasicity and response to augmentation. Subclavian Vein: Noncompressible with nearly completely occlusive thrombus. Axillary Vein: Noncompressible with nearly completely occlusive thrombus. Cephalic Vein: Noncompressible in the proximal portion with nearly completely occlusive thrombus. Patent with no evidence of thrombus in the mid and distal portions with normal compressibility, respiratory phasicity, and response to augmentation. Basilic Vein: Noncompressible with nonocclusive thrombus in the proximal portion. Patent in the mid and distal portions Brachial Veins: 1 of the paired brachial veins is noncompressible with occlusive thrombus throughout. Radial Veins: No evidence of thrombus. Normal compressibility, respiratory phasicity and response to augmentation. Ulnar Veins: No evidence of thrombus. Normal compressibility, respiratory phasicity and response to augmentation. Venous Reflux:  None visualized. Other  Findings:  None visualized. IMPRESSION: 1. Nearly occlusive DVT within 1 of the paired right brachial veins extending into the right axillary and subclavian veins. 2. Near occlusive thrombi within the proximal cephalic and proximal basilic veins. Critical Value/emergent results were called by telephone at the time of interpretation on 01/14/2022 at 3:19 pm to provider [facility] , who verbally acknowledged these results. Electronically Signed   By: Darrin Nipper M.D.   On: 01/14/2022 15:20    Assessment/Plan Hyperlipidemia lipid control important in reducing the progression of atherosclerotic disease. Continue statin therapy     Malignant neoplasm of upper-outer quadrant of left breast in female, estrogen receptor negative ([place]) Currently getting chemotherapy and has a Port-A-Cath in place.  Only 2 more treatments remaining.  Acute deep vein thrombosis (DVT) of axillary vein of right upper extremity (HCC) We again discussed the procedure and we will plan removal of her Port-A-Cath with a venogram and expected thrombectomy and right subclavian intervention on 10/26 at the request of her oncologist.  This would likely involve a brachial vein access as well as potentially rewiring her Port-A-Cath.  Risks and benefits of the procedure were discussed with the patient and she is agreeable to proceed.  She will continue her anticoagulation until that time and does not need to stop it.    [name], MD  02/12/2022 10:11 AM    This note  was created with Dragon medical transcription system.  Any errors from dictation are purely unintentional

## 2022-02-12 NOTE — Progress Notes (Signed)
MRN : [id number]  Megan Harrison is a 66 y.o. (Feb 03, 1956) female who presents with chief complaint of  Chief Complaint  Patient presents with   Follow-up    Discuss port removal  .  History of Present Illness: Patient returns today in follow up of her right upper extremity DVT with a right subclavian Port-A-Cath in place.  This was placed by her general surgeon after her breast cancer resection on the left.  We performed a thrombectomy about a month ago which did improve her arm swelling a little but it still fairly significant and persistent.  We knew that her arm would not dramatically improved until we remove the port and treated her subclavian stenosis, but she only had 2 chemotherapy treatments left so we were trying to finish those.  She gets her last 1 of those this week and her oncologist wants her to wait until the week of October 23 to remove the port due to possible infectious issues with her diminished immune system after chemotherapy which is very reasonable.  She is tolerating anticoagulation.  She has noticed a little bit of lower extremity swelling too.  She has tolerated chemotherapy reasonably well.  Current Outpatient Medications  Medication Sig Dispense Refill   acetaminophen (TYLENOL) 500 MG tablet Take 2 tablets (1,000 mg total) by mouth every 6 (six) hours as needed for mild pain.     APIXABAN (ELIQUIS) VTE STARTER PACK ($RemoveBefor'10MG'uFDQdUeAcTeu$  AND $Re'5MG'lmG$ ) Take as directed on package: start with two-$RemoveBefore'5mg'CKaLLFLQBRwIB$  tablets twice daily for 7 days. On day 8, switch to one-$RemoveBefor'5mg'hLtFQlPABNKa$  tablet twice daily. Stop 81 mg aspirin and Mobic while on this medication 1 each 0   atorvastatin (LIPITOR) 10 MG tablet Take 1 tablet (10 mg total) by mouth daily. 90 tablet 3   calcium carbonate (TUMS - DOSED IN MG ELEMENTAL CALCIUM) 500 MG chewable tablet Chew 1 tablet by mouth as needed for indigestion or heartburn.     Cholecalciferol (VITAMIN D3) 50 MCG (2000 UT) TABS Take 1 tablet by mouth daily.     ferrous sulfate 325 (65  FE) MG tablet Take 325 mg by mouth daily.     fluticasone (FLONASE) 50 MCG/ACT nasal spray USE ONE SPRAY(S) IN EACH NOSTRIL ONCE DAILY AS DIRECTED (Patient taking differently: 1 spray at bedtime. USE ONE SPRAY(S) IN EACH NOSTRIL ONCE DAILY AS DIRECTED) 48 g 3   gabapentin (NEURONTIN) 300 MG capsule Take 300 mg by mouth daily after supper.     Magnesium Hydroxide (DULCOLAX PO) Take 1 tablet by mouth 2 (two) times daily.     Multiple Vitamins-Minerals (ONE-A-DAY WOMENS 50 PLUS PO) Take 1 tablet by mouth daily.     pantoprazole (PROTONIX) 20 MG tablet Take 1 tablet (20 mg total) by mouth daily. 30 tablet 2   prednisoLONE acetate (PRED FORTE) 1 % ophthalmic suspension Place 1 drop into the left eye every other day.     Timolol Maleate 0.5 % (DAILY) SOLN Place 1 drop into the left eye daily at 6 (six) AM.     No current facility-administered medications for this visit.   Facility-Administered Medications Ordered in Other Visits  Medication Dose Route Frequency Provider Last Rate Last Admin   heparin lock flush 100 UNIT/ML injection            palonosetron (ALOXI) 0.25 MG/5ML injection             Past Medical History:  Diagnosis Date   Anemia    Cutaneous sarcoidosis  Degenerative disc disease    Diverticulosis    Dysplastic nevus 11/19/2017   L mid back 6.0 cm lat to spine   Foot pain    GERD (gastroesophageal reflux disease)    Hyperlipidemia    Malignant neoplasm of upper-outer quadrant of left breast in female, estrogen receptor negative ([place]) 11/04/2021   Migraine    Shingles    eyes    Past Surgical History:  Procedure Laterality Date   2D Echo  1999   Negative   ABDOMINAL HYSTERECTOMY  03/1998   APPENDECTOMY     Arm lesion, cutaneous sarcoid  12/2005   with normal CXR and ANA   BREAST BIOPSY Left 10/23/2021   US biopsy/ heart clip/ [facility]   BREAST LUMPECTOMY WITH RADIOFREQUENCY TAG IDENTIFICATION Left 50/07/7046   lt br rf tag placement   BREAST LUMPECTOMY,RADIO FREQ  [place] BIOPSY Left 11/20/2021   Procedure: BREAST LUMPECTOMY,RADIO FREQ LOCALIZER,AXILLARY SENTINEL LYMPH NODE BIOPSY;  Surgeon: Olean Ree, MD;  Location: [facility];  Service: General;  Laterality: Left;   CESAREAN SECTION     x2   COLONOSCOPY  11/0/2009,08/18/13   diverticulosis and hemorrhoids   DIAGNOSTIC LAPAROSCOPY     EYE SURGERY  10/13/2018   had shingles in left eye, had to have surgery, has contact lens in now.   FOOT NEUROMA SURGERY Left    LAPAROSCOPIC APPENDECTOMY N/A 02/18/2016   Procedure: APPENDECTOMY LAPAROSCOPIC;  Surgeon: Ralene Ok, MD;  Location: [facility];  Service: General;  Laterality: N/A;   LUMBAR LAMINECTOMY  10/2017   PERIPHERAL VASCULAR THROMBECTOMY Right 01/16/2022   Procedure: PERIPHERAL VASCULAR THROMBECTOMY;  Surgeon: Algernon Huxley, MD;  Location: [facility];  Service: Cardiovascular;  Laterality: Right;   PORTACATH PLACEMENT Right 12/05/2021   Procedure: INSERTION PORT-A-CATH;  Surgeon: Olean Ree, MD;  Location: [facility];  Service: General;  Laterality: Right;   ROTATOR CUFF REPAIR Left    TONSILLECTOMY     UPPER GASTROINTESTINAL ENDOSCOPY  11/15/2010   non-erosive gastritis, H. pylori negative     Social History   Tobacco Use   Smoking status: Never   Smokeless tobacco: Never  Vaping Use   Vaping Use: Never used  Substance Use Topics   Alcohol use: No    Alcohol/week: 0.0 standard drinks of alcohol   Drug use: No      Family History  Problem Relation Age of Onset   Colon cancer Brother        early 62's   Stomach cancer Brother    Heart disease Mother        CAD   Alcohol abuse Mother    Lung cancer Father    Heart disease Brother        CAD   Throat cancer Brother    Heart disease Brother        CAD, pre diabetic, obese   Colon polyps Sister    Breast cancer Neg Hx    Rectal cancer Neg Hx      Allergies  Allergen Reactions   Codeine     REACTION: syncope   Sumatriptan      REACTION: choking sensation      REVIEW OF SYSTEMS (Negative unless checked)   Constitutional: $RemoveBeforeDE'[]'OqTjJeJwfnPmzeV$ Weight loss  $Rem'[]'jPLt$ Fever  $Remo'[]'jfOce$ Chills Cardiac: $RemoveBeforeD'[]'tOFJGFraBebDyB$ Chest pain   '[]'$ Chest pressure   '[]'$ Palpitations   '[]'$ Shortness of breath when laying flat   '[]'$ Shortness of breath at rest   '[x]'$ Shortness of breath with exertion. Vascular:  $RemoveBe'[]'nmnqQewmQ$ Pain in legs  with walking   [] Pain in legs at rest   [] Pain in legs when laying flat   [] Claudication   [] Pain in feet when walking  [] Pain in feet at rest  [] Pain in feet when laying flat   [] History of DVT   [x] Phlebitis   [x] Swelling in legs   [] Varicose veins   [] Non-healing ulcers Pulmonary:   [] Uses home oxygen   [] Productive cough   [] Hemoptysis   [] Wheeze  [] COPD   [] Asthma Neurologic:  [] Dizziness  [] Blackouts   [] Seizures   [] History of stroke   [] History of TIA  [] Aphasia   [] Temporary blindness   [] Dysphagia   [] Weakness or numbness in arms   [] Weakness or numbness in legs Musculoskeletal:  [x] Arthritis   [] Joint swelling   [] Joint pain   [] Low back pain Hematologic:  [] Easy bruising  [] Easy bleeding   [] Hypercoagulable state   [x] Anemic  [] Hepatitis Gastrointestinal:  [] Blood in stool   [] Vomiting blood  [x] Gastroesophageal reflux/heartburn   [] Abdominal pain Genitourinary:  [] Chronic kidney disease   [] Difficult urination  [] Frequent urination  [] Burning with urination   [] Hematuria Skin:  [] Rashes   [] Ulcers   [] Wounds Psychological:  [] History of anxiety   []  History of major depression.    Physical Examination  BP 132/79 (BP Location: Right Arm)   Pulse 80   Resp 16   Wt 188 lb 12.8 oz (85.6 kg)   BMI 32.41 kg/m  Gen:  WD/WN, NAD Head: [place]/AT, No temporalis wasting. Ear/Nose/Throat: Hearing grossly intact, nares w/o erythema or drainage Eyes: Conjunctiva clear. Sclera non-icteric Neck: Supple.  Trachea midline Pulmonary:  Good air movement, no use of accessory muscles.  Cardiac: RRR, no JVD.  Port in place in right chest with right arm swelling  present Vascular:  Vessel Right Left  Radial Palpable Palpable                           Musculoskeletal: M/S 5/5 throughout.  No deformity or atrophy.  Trace lower extremity edema, 2+ right upper extremity edema. Neurologic: Sensation grossly intact in extremities.  Symmetrical.  Speech is fluent.  Psychiatric: Judgment intact, Mood & affect appropriate for pt's clinical situation. Dermatologic: No rashes or ulcers noted.  No cellulitis or open wounds.      Labs Recent Results (from the past 2160 hour(s))  Basic metabolic panel     Status: Abnormal   Collection Time: 11/16/21  2:52 PM  Result Value Ref Range   Sodium 141 135 - 145 mmol/L   Potassium 3.9 3.5 - 5.1 mmol/L   Chloride 106 98 - 111 mmol/L   CO2 28 22 - 32 mmol/L   Glucose, Bld 115 (H) 70 - 99 mg/dL    Comment: Glucose reference range applies only to samples taken after fasting for at least 8 hours.   BUN 20 8 - 23 mg/dL   Creatinine, Ser 0.77 0.44 - 1.00 mg/dL   Calcium 9.5 8.9 - 10.3 mg/dL   GFR, Estimated >60 >60 mL/min    Comment: (NOTE) Calculated using the CKD-EPI Creatinine Equation (2021)    Anion gap 7 5 - 15    Comment: Performed at [facility], [address] [place], [place] [address]  CBC     Status: None   Collection Time: 11/16/21  2:52 PM  Result Value Ref Range   WBC 8.9 4.0 - 10.5 K/uL   RBC 4.05 3.87 - 5.11 MIL/uL   Hemoglobin 12.2 12.0 -  15.0 g/dL   HCT 37.3 36.0 - 46.0 %   MCV 92.1 80.0 - 100.0 fL   MCH 30.1 26.0 - 34.0 pg   MCHC 32.7 30.0 - 36.0 g/dL   RDW 13.0 11.5 - 15.5 %   Platelets 317 150 - 400 K/uL   nRBC 0.0 0.0 - 0.2 %    Comment: Performed at [facility], [address], [place], [place] [address]  Surgical pathology     Status: None   Collection Time: 11/20/21  1:05 PM  Result Value Ref Range   SURGICAL PATHOLOGY      SURGICAL PATHOLOGY CASE: [id number] PATIENT: [facility] Surgical Pathology Report     Specimen Submitted: A.  Breast, left B. Sentinel node 1 C. Sentinel node 2  Clinical History: Left breast cancer    DIAGNOSIS: A. BREAST, LEFT; EXCISION: - INVASIVE MAMMARY CARCINOMA, NO SPECIAL TYPE. - DUCTAL CARCINOMA IN SITU, HIGH-GRADE WITH COMEDONECROSIS. - CLIP, RF TAG, AND BIOPSY SITE IDENTIFIED. - SEE CANCER SUMMARY.  B. LYMPH NODE, LEFT AXILLARY SENTINEL #1 (HOT/BLUE #771); EXCISION: - ONE LYMPH NODE, NEGATIVE FOR MALIGNANCY (0/1).  C. LYMPH NODE, LEFT AXILLARY SENTINEL #2; EXCISION: - ONE LYMPH NODE, NEGATIVE FOR MALIGNANCY (0/1).  CANCER CASE SUMMARY: INVASIVE CARCINOMA OF THE BREAST Standard(s): AJCC-UICC 8  SPECIMEN Procedure: Excision Specimen Laterality: Left  TUMOR Histologic Type: Invasive carcinoma of no special type (ductal) Histologic Grade (Nottingham Histologic Score)      Glandular (Acinar)/Tubular Differentiation: 3      Nuclear Ple omorphism: 3      Mitotic Rate: 3      Overall Grade: Grade 3 Tumor Size: 8 mm Tumor Focality: Single focus of invasive carcinoma Ductal Carcinoma In Situ (DCIS): Present, high-grade with comedonecrosis Tumor Extent: Not applicable Lymphatic and/or Vascular Invasion: Not identified Treatment Effect in the Breast: No known presurgical therapy  MARGINS Margin Status for Invasive Carcinoma: All margins negative for invasive carcinoma      Distance from closest margin: 10 mm      Specify closest margin: All surgical margins  Margin Status for DCIS: All margins negative for DCIS      Distance from DCIS to closest margin: 2 mm      Specify closest margin: Inferior, posterior  REGIONAL LYMPH NODES Regional Lymph Node Status: All regional lymph nodes negative for tumor      Total Number of Lymph Nodes Examined (sentinel and non-sentinel): 2       Number of Sentinel Nodes Examined: 2  DISTANT METASTASIS Distant Site(s) Involved, if applicable: Not applicable  PATHOLO GIC STAGE CLASSIFICATION (pTNM, AJCC 8th Edition): Modified  Classification: Not applicable pT Category: pT1b T Suffix: Not applicable pN Category: pN0 N Suffix: sn pM Category: Not applicable  SPECIAL STUDIES Breast Biomarker Testing Performed on Previous Biopsy: [phone] Estrogen Receptor (ER) Status: NEGATIVE (LESS THAN 1%)  Progesterone Receptor (PgR) Status: NEGATIVE (LESS THAN 1%)  HER2 (by immunohistochemistry): NEGATIVE (Score 0) Ki-67: Not performed  (v4.9.0.0)  GROSS DESCRIPTION: Intraoperative Consultation:     Labeled: Left breast mass     Received: Fresh     Specimen: Breast lumpectomy     Pathologic evaluation performed: Gross margin evaluation     Diagnosis: IOC: Mass with clip.  Margins grossly clear.     Communicated to: Called to Dr. [facility] Abbot at 1:16 PM on 11/20/2021 Quay Burow M.D.     Tissue submitted: None  A. Labeled: Left breast mass Received: Fresh Specimen radiograph image(s) available for review Radiographic findings:  A clip  and RF ID tag are present. Time in fixative: Collected at 1:05 PM on 11/20/2021 and placed in formalin at 1:16 PM on 11/20/2021 Cold ischemic time: Less than 30 minutes Total fixation time: Approximately 28 hours Type of procedure: Breast lumpectomy Location / laterality of specimen: Left breast Orientation of specimen: The specimen is received inked. Inking: Anterior = green Inferior = blue Lateral = orange Medial = yellow Posterior = black Superior = red Size of specimen: 5.8 (medial to lateral) x 5.2 (superior to inferior) x 3.1 (anterior to posterior) cm Skin: None grossly identified.  Biopsy site: There is a biopsy site which contains a heart-shaped clip.  Number of discrete masses: 1 Size of mass(es): 0.9 x 0.8 x 0.6 cm Description of mass(es): The mass is tan-white, firm and focally ill-defined. Distance between masses/clips: The clip is located within the mass. Margins: Anterior = 0.7 cm, superior = 1 cm, inferior = 0.9 cm, posterior = 1 cm, medial = 0. 7 cm,  lateral = 0.7 cm Description of remainder of tissue: The remainder of the specimen is comprised of yellow lobulated adipose tissue admixed with scattered areas of tan fibrous tissue.  The fat to fibrous tissue ratio is 90:10.  Block summary (approximately 30% of the mass remains and approximately 60% of the specimen remains.): 1 - 2 - medial margin, perpendicularly sectioned 3 - medial margin closest to mass 4 - 5 - representative sections of mass      4 - clip site with closest approach to anterior, inferior, and posterior margins      5 - additional anterior margin 6 - additional inferior and posterior margins to mass 7 - closest superior margins to mass 8 - closest lateral margins to mass 9 - 12 - lateral margin, perpendicularly sectioned  B. Labeled: Left sentinel node #1 (hot/blue 771) Received: Fresh Collection time: 11:54 AM on 11/20/2021 Placed into formalin time: 1:54 PM on 11/20/2021 Tissue fragment(s): 1 Size: 1.8 x 1.1 x 0.6 cm Description: Recei ved is a blue dyed lymph node candidate with a scant amount of surrounding adipose tissue.  The specimen is serially sectioned. Entirely submitted in 1 cassette.  C. Labeled: Sentinel node 2 Received: Fresh Collection time: 12:14 PM on 11/20/2021 Placed into formalin time: 1:54 PM on 11/20/2021 Tissue fragment(s): 1 Size: 1.2 x 1 x 0.5 cm Description: Received is a lymph node candidate with a scant amount of surrounding adipose tissue.  The specimen is bisected revealing central areas of adipose tissue. Entirely submitted in 1 cassette.  RB 11/21/2021   Final Diagnosis performed by Allena Napoleon, MD.   Electronically signed 11/22/2021 2:57:50PM The electronic signature indicates that the named Attending Pathologist has evaluated the specimen Technical component performed at [facility], [address], [place], [place] [address] Lab: [phone] Dir: Rush Farmer, MD, MMM  Professional component performed at [facility], [facility], [place] , [place], [place] [address] Lab: [phone] Dir: Kathi Simpers, MD   CBC with Differential     Status: Abnormal   Collection Time: 12/12/21  9:07 AM  Result Value Ref Range   WBC 13.9 (H) 4.0 - 10.5 K/uL   RBC 4.05 3.87 - 5.11 MIL/uL   Hemoglobin 12.3 12.0 - 15.0 g/dL   HCT 37.2 36.0 - 46.0 %   MCV 91.9 80.0 - 100.0 fL   MCH 30.4 26.0 - 34.0 pg   MCHC 33.1 30.0 - 36.0 g/dL   RDW 12.9 11.5 - 15.5 %  Platelets 360 150 - 400 K/uL   nRBC 0.0 0.0 - 0.2 %   Neutrophils Relative % 84 %   Neutro Abs 11.8 (H) 1.7 - 7.7 K/uL   Lymphocytes Relative 11 %   Lymphs Abs 1.5 0.7 - 4.0 K/uL   Monocytes Relative 4 %   Monocytes Absolute 0.6 0.1 - 1.0 K/uL   Eosinophils Relative 0 %   Eosinophils Absolute 0.0 0.0 - 0.5 K/uL   Basophils Relative 0 %   Basophils Absolute 0.0 0.0 - 0.1 K/uL   Immature Granulocytes 1 %   Abs Immature Granulocytes 0.07 0.00 - 0.07 K/uL    Comment: Performed at [facility], [address] [place], [place] [address]  Comprehensive metabolic panel     Status: Abnormal   Collection Time: 12/12/21  9:07 AM  Result Value Ref Range   Sodium 140 135 - 145 mmol/L   Potassium 3.9 3.5 - 5.1 mmol/L   Chloride 106 98 - 111 mmol/L   CO2 24 22 - 32 mmol/L   Glucose, Bld 150 (H) 70 - 99 mg/dL    Comment: Glucose reference range applies only to samples taken after fasting for at least 8 hours.   BUN 23 8 - 23 mg/dL   Creatinine, Ser 0.65 0.44 - 1.00 mg/dL   Calcium 9.8 8.9 - 10.3 mg/dL   Total Protein 7.8 6.5 - 8.1 g/dL   Albumin 4.1 3.5 - 5.0 g/dL   AST 26 15 - 41 U/L   ALT 17 0 - 44 U/L   Alkaline Phosphatase 81 38 - 126 U/L   Total Bilirubin 0.3 0.3 - 1.2 mg/dL   GFR, Estimated >60 >60 mL/min    Comment: (NOTE) Calculated using the CKD-EPI Creatinine Equation (2021)    Anion gap 10 5 - 15    Comment: Performed at [facility], [address], [place], [place] [address]  Basic metabolic panel     Status:  Abnormal   Collection Time: 12/19/21 10:10 AM  Result Value Ref Range   Sodium 135 135 - 145 mmol/L   Potassium 3.8 3.5 - 5.1 mmol/L   Chloride 103 98 - 111 mmol/L   CO2 23 22 - 32 mmol/L   Glucose, Bld 121 (H) 70 - 99 mg/dL    Comment: Glucose reference range applies only to samples taken after fasting for at least 8 hours.   BUN 21 8 - 23 mg/dL   Creatinine, Ser 0.76 0.44 - 1.00 mg/dL   Calcium 8.7 (L) 8.9 - 10.3 mg/dL   GFR, Estimated >60 >60 mL/min    Comment: (NOTE) Calculated using the CKD-EPI Creatinine Equation (2021)    Anion gap 9 5 - 15    Comment: Performed at [facility], [address] [place], [place] [address]  CBC with Differential     Status: Abnormal   Collection Time: 12/19/21 10:10 AM  Result Value Ref Range   WBC 6.6 4.0 - 10.5 K/uL   RBC 4.06 3.87 - 5.11 MIL/uL   Hemoglobin 12.3 12.0 - 15.0 g/dL   HCT 37.2 36.0 - 46.0 %   MCV 91.6 80.0 - 100.0 fL   MCH 30.3 26.0 - 34.0 pg   MCHC 33.1 30.0 - 36.0 g/dL   RDW 12.8 11.5 - 15.5 %   Platelets 236 150 - 400 K/uL   nRBC 0.0 0.0 - 0.2 %   Neutrophils Relative % 12 %   Neutro Abs 0.8 (L) 1.7 - 7.7 K/uL  Lymphocytes Relative 29 %   Lymphs Abs 1.9 0.7 - 4.0 K/uL   Monocytes Relative 31 %   Monocytes Absolute 2.1 (H) 0.1 - 1.0 K/uL   Eosinophils Relative 2 %   Eosinophils Absolute 0.2 0.0 - 0.5 K/uL   Basophils Relative 1 %   Basophils Absolute 0.0 0.0 - 0.1 K/uL   WBC Morphology      MARKED LEFT SHIFT (>5% METAS,MYELOS AND PROS, OCC BLAST NOTED)    Comment: TOXIC GRANULATION   RBC Morphology MORPHOLOGY UNREMARKABLE    Smear Review Normal platelet morphology     Comment: PLATELETS APPEAR ADEQUATE   Immature Granulocytes 25 %    Comment: Increased IG's, likely caused by Bone Marrow Colony Stimulating Factor received within 30 days. UDENYCA ON 8.11.23    Abs Immature Granulocytes 1.63 (H) 0.00 - 0.07 K/uL    Comment: Performed at [facility], [address] [place], [place] [address]   Comprehensive metabolic panel     Status: Abnormal   Collection Time: 01/02/22  8:40 AM  Result Value Ref Range   Sodium 137 135 - 145 mmol/L   Potassium 3.8 3.5 - 5.1 mmol/L   Chloride 108 98 - 111 mmol/L   CO2 22 22 - 32 mmol/L   Glucose, Bld 190 (H) 70 - 99 mg/dL    Comment: Glucose reference range applies only to samples taken after fasting for at least 8 hours.   BUN 20 8 - 23 mg/dL   Creatinine, Ser 0.78 0.44 - 1.00 mg/dL   Calcium 9.3 8.9 - 10.3 mg/dL   Total Protein 6.9 6.5 - 8.1 g/dL   Albumin 3.7 3.5 - 5.0 g/dL   AST 30 15 - 41 U/L   ALT 19 0 - 44 U/L   Alkaline Phosphatase 73 38 - 126 U/L   Total Bilirubin 0.3 0.3 - 1.2 mg/dL   GFR, Estimated >60 >60 mL/min    Comment: (NOTE) Calculated using the CKD-EPI Creatinine Equation (2021)    Anion gap 7 5 - 15    Comment: Performed at [facility], [address] [place], [place] [address]  CBC with Differential     Status: Abnormal   Collection Time: 01/02/22  8:40 AM  Result Value Ref Range   WBC 13.0 (H) 4.0 - 10.5 K/uL   RBC 3.57 (L) 3.87 - 5.11 MIL/uL   Hemoglobin 11.0 (L) 12.0 - 15.0 g/dL   HCT 32.9 (L) 36.0 - 46.0 %   MCV 92.2 80.0 - 100.0 fL   MCH 30.8 26.0 - 34.0 pg   MCHC 33.4 30.0 - 36.0 g/dL   RDW 14.1 11.5 - 15.5 %   Platelets 504 (H) 150 - 400 K/uL   nRBC 0.0 0.0 - 0.2 %   Neutrophils Relative % 89 %   Neutro Abs 11.6 (H) 1.7 - 7.7 K/uL   Lymphocytes Relative 7 %   Lymphs Abs 0.9 0.7 - 4.0 K/uL   Monocytes Relative 3 %   Monocytes Absolute 0.4 0.1 - 1.0 K/uL   Eosinophils Relative 0 %   Eosinophils Absolute 0.0 0.0 - 0.5 K/uL   Basophils Relative 0 %   Basophils Absolute 0.0 0.0 - 0.1 K/uL   Immature Granulocytes 1 %   Abs Immature Granulocytes 0.08 (H) 0.00 - 0.07 K/uL    Comment: Performed at [facility], [address] [place], [place] [address]  Urinalysis, Complete w Microscopic     Status: Abnormal   Collection Time: 01/11/22  11:11 AM  Result Value Ref Range   Color, Urine  YELLOW (A) YELLOW   APPearance HAZY (A) CLEAR   Specific Gravity, Urine 1.024 1.005 - 1.030   pH 5.0 5.0 - 8.0   Glucose, UA NEGATIVE NEGATIVE mg/dL   Hgb urine dipstick SMALL (A) NEGATIVE   Bilirubin Urine NEGATIVE NEGATIVE   Ketones, ur NEGATIVE NEGATIVE mg/dL   Protein, ur NEGATIVE NEGATIVE mg/dL   Nitrite NEGATIVE NEGATIVE   Leukocytes,Ua NEGATIVE NEGATIVE   RBC / HPF 6-10 0 - 5 RBC/hpf   WBC, UA 6-10 0 - 5 WBC/hpf   Bacteria, UA RARE (A) NONE SEEN   Squamous Epithelial / LPF 0-5 0 - 5   Mucus PRESENT    Non Squamous Epithelial PRESENT (A) NONE SEEN    Comment: Performed at [facility], [address] [place], [place] [address]  Urine culture     Status: Abnormal   Collection Time: 01/11/22 11:11 AM   Specimen: Urine, Clean Catch  Result Value Ref Range   Specimen Description      URINE, CLEAN CATCH Performed at [facility], [address] [place], [place] [address]    Special Requests      NONE Performed at [facility], [address] [place], [place] [address]    Culture 60,000 COLONIES/mL ESCHERICHIA COLI (A)    Report Status 01/13/2022 FINAL    Organism ID, Bacteria ESCHERICHIA COLI (A)       Susceptibility   Escherichia coli - MIC*    AMPICILLIN >=32 RESISTANT Resistant     CEFAZOLIN <=4 SENSITIVE Sensitive     CEFEPIME <=0.12 SENSITIVE Sensitive     CEFTRIAXONE <=0.25 SENSITIVE Sensitive     CIPROFLOXACIN <=0.25 SENSITIVE Sensitive     GENTAMICIN <=1 SENSITIVE Sensitive     IMIPENEM <=0.25 SENSITIVE Sensitive     NITROFURANTOIN <=16 SENSITIVE Sensitive     TRIMETH/SULFA <=20 SENSITIVE Sensitive     AMPICILLIN/SULBACTAM 16 INTERMEDIATE Intermediate     PIP/TAZO <=4 SENSITIVE Sensitive     * 60,000 COLONIES/mL ESCHERICHIA COLI  BUN     Status: None   Collection Time: 01/16/22 12:36 PM  Result Value Ref Range   BUN 21 8 - 23 mg/dL    Comment: Performed at [facility], [address] [place], [place] [address]   Creatinine, serum     Status: None   Collection Time: 01/16/22 12:36 PM  Result Value Ref Range   Creatinine, Ser 0.97 0.44 - 1.00 mg/dL   GFR, Estimated >60 >60 mL/min    Comment: (NOTE) Calculated using the CKD-EPI Creatinine Equation (2021) Performed at [facility], [address] [place], [place] [address]   CBC with Differential     Status: Abnormal   Collection Time: 01/17/22 10:32 AM  Result Value Ref Range   WBC 12.6 (H) 4.0 - 10.5 K/uL   RBC 3.30 (L) 3.87 - 5.11 MIL/uL   Hemoglobin 10.0 (L) 12.0 - 15.0 g/dL   HCT 30.9 (L) 36.0 - 46.0 %   MCV 93.6 80.0 - 100.0 fL   MCH 30.3 26.0 - 34.0 pg   MCHC 32.4 30.0 - 36.0 g/dL   RDW 15.5 11.5 - 15.5 %   Platelets 115 (L) 150 - 400 K/uL   nRBC 0.4 (H) 0.0 - 0.2 %   Neutrophils Relative % 78 %   Neutro Abs 9.9 (H) 1.7 - 7.7 K/uL   Lymphocytes Relative 12 %   Lymphs Abs 1.5  0.7 - 4.0 K/uL   Monocytes Relative 7 %   Monocytes Absolute 0.9 0.1 - 1.0 K/uL   Eosinophils Relative 0 %   Eosinophils Absolute 0.0 0.0 - 0.5 K/uL   Basophils Relative 1 %   Basophils Absolute 0.1 0.0 - 0.1 K/uL   Immature Granulocytes 2 %   Abs Immature Granulocytes 0.21 (H) 0.00 - 0.07 K/uL    Comment: Performed at [facility], [address] [place], [place] [address]  Comprehensive metabolic panel     Status: Abnormal   Collection Time: 01/17/22 10:32 AM  Result Value Ref Range   Sodium 138 135 - 145 mmol/L   Potassium 4.4 3.5 - 5.1 mmol/L   Chloride 110 98 - 111 mmol/L   CO2 23 22 - 32 mmol/L   Glucose, Bld 101 (H) 70 - 99 mg/dL    Comment: Glucose reference range applies only to samples taken after fasting for at least 8 hours.   BUN 21 8 - 23 mg/dL   Creatinine, Ser 0.83 0.44 - 1.00 mg/dL   Calcium 8.6 (L) 8.9 - 10.3 mg/dL   Total Protein 6.5 6.5 - 8.1 g/dL   Albumin 3.6 3.5 - 5.0 g/dL   AST 29 15 - 41 U/L   ALT 26 0 - 44 U/L   Alkaline Phosphatase 100 38 - 126 U/L   Total Bilirubin 0.3 0.3 - 1.2 mg/dL   GFR,  Estimated >60 >60 mL/min    Comment: (NOTE) Calculated using the CKD-EPI Creatinine Equation (2021)    Anion gap 5 5 - 15    Comment: Performed at [facility], [address] [place], [place] [address]  Comprehensive metabolic panel     Status: Abnormal   Collection Time: 01/23/22  9:05 AM  Result Value Ref Range   Sodium 138 135 - 145 mmol/L   Potassium 3.7 3.5 - 5.1 mmol/L   Chloride 109 98 - 111 mmol/L   CO2 23 22 - 32 mmol/L   Glucose, Bld 189 (H) 70 - 99 mg/dL    Comment: Glucose reference range applies only to samples taken after fasting for at least 8 hours.   BUN 18 8 - 23 mg/dL   Creatinine, Ser 0.61 0.44 - 1.00 mg/dL   Calcium 8.9 8.9 - 10.3 mg/dL   Total Protein 6.5 6.5 - 8.1 g/dL   Albumin 3.5 3.5 - 5.0 g/dL   AST 28 15 - 41 U/L   ALT 21 0 - 44 U/L   Alkaline Phosphatase 65 38 - 126 U/L   Total Bilirubin 0.3 0.3 - 1.2 mg/dL   GFR, Estimated >60 >60 mL/min    Comment: (NOTE) Calculated using the CKD-EPI Creatinine Equation (2021)    Anion gap 6 5 - 15    Comment: Performed at [facility], [address] [place], [place] [address]  CBC with Differential     Status: Abnormal   Collection Time: 01/23/22  9:05 AM  Result Value Ref Range   WBC 10.8 (H) 4.0 - 10.5 K/uL   RBC 2.96 (L) 3.87 - 5.11 MIL/uL   Hemoglobin 9.3 (L) 12.0 - 15.0 g/dL   HCT 27.8 (L) 36.0 - 46.0 %   MCV 93.9 80.0 - 100.0 fL   MCH 31.4 26.0 - 34.0 pg   MCHC 33.5 30.0 - 36.0 g/dL   RDW 16.4 (H) 11.5 - 15.5 %   Platelets 531 (H) 150 - 400 K/uL   nRBC 0.0 0.0 - 0.2 %  Neutrophils Relative % 89 %   Neutro Abs 9.7 (H) 1.7 - 7.7 K/uL   Lymphocytes Relative 6 %   Lymphs Abs 0.6 (L) 0.7 - 4.0 K/uL   Monocytes Relative 4 %   Monocytes Absolute 0.4 0.1 - 1.0 K/uL   Eosinophils Relative 0 %   Eosinophils Absolute 0.0 0.0 - 0.5 K/uL   Basophils Relative 0 %   Basophils Absolute 0.0 0.0 - 0.1 K/uL   Immature Granulocytes 1 %   Abs Immature Granulocytes 0.06 0.00 - 0.07 K/uL     Comment: Performed at [facility], [address], [place], [place] [address]    Radiology PERIPHERAL VASCULAR CATHETERIZATION  Result Date: 01/16/2022 See surgical note for result.  US Venous Img Upper Uni Right  Result Date: 01/14/2022 CLINICAL DATA:  Right upper extremity edema EXAM: Right UPPER EXTREMITY VENOUS DOPPLER ULTRASOUND TECHNIQUE: Gray-scale sonography with graded compression, as well as color Doppler and duplex ultrasound were performed to evaluate the upper extremity deep venous system from the level of the subclavian vein and including the jugular, axillary, basilic, radial, ulnar and upper cephalic vein. Spectral Doppler was utilized to evaluate flow at rest and with distal augmentation maneuvers. COMPARISON:  None Available. FINDINGS: Contralateral Subclavian Vein: Respiratory phasicity is normal and asymmetric with the symptomatic side. No evidence of thrombus. Normal compressibility. Internal Jugular Vein: No evidence of thrombus. Normal compressibility, respiratory phasicity and response to augmentation. Subclavian Vein: Noncompressible with nearly completely occlusive thrombus. Axillary Vein: Noncompressible with nearly completely occlusive thrombus. Cephalic Vein: Noncompressible in the proximal portion with nearly completely occlusive thrombus. Patent with no evidence of thrombus in the mid and distal portions with normal compressibility, respiratory phasicity, and response to augmentation. Basilic Vein: Noncompressible with nonocclusive thrombus in the proximal portion. Patent in the mid and distal portions Brachial Veins: 1 of the paired brachial veins is noncompressible with occlusive thrombus throughout. Radial Veins: No evidence of thrombus. Normal compressibility, respiratory phasicity and response to augmentation. Ulnar Veins: No evidence of thrombus. Normal compressibility, respiratory phasicity and response to augmentation. Venous Reflux:  None visualized. Other  Findings:  None visualized. IMPRESSION: 1. Nearly occlusive DVT within 1 of the paired right brachial veins extending into the right axillary and subclavian veins. 2. Near occlusive thrombi within the proximal cephalic and proximal basilic veins. Critical Value/emergent results were called by telephone at the time of interpretation on 01/14/2022 at 3:19 pm to provider [facility] , who verbally acknowledged these results. Electronically Signed   By: Darrin Nipper M.D.   On: 01/14/2022 15:20    Assessment/Plan Hyperlipidemia lipid control important in reducing the progression of atherosclerotic disease. Continue statin therapy     Malignant neoplasm of upper-outer quadrant of left breast in female, estrogen receptor negative ([place]) Currently getting chemotherapy and has a Port-A-Cath in place.  Only 2 more treatments remaining.  Acute deep vein thrombosis (DVT) of axillary vein of right upper extremity (HCC) We again discussed the procedure and we will plan removal of her Port-A-Cath with a venogram and expected thrombectomy and right subclavian intervention on 10/26 at the request of her oncologist.  This would likely involve a brachial vein access as well as potentially rewiring her Port-A-Cath.  Risks and benefits of the procedure were discussed with the patient and she is agreeable to proceed.  She will continue her anticoagulation until that time and does not need to stop it.    [name], MD  02/12/2022 10:11 AM    This note  was created with Dragon medical transcription system.  Any errors from dictation are purely unintentional

## 2022-02-13 ENCOUNTER — Other Ambulatory Visit: Payer: Self-pay

## 2022-02-13 ENCOUNTER — Inpatient Hospital Stay: Payer: Medicare PPO | Attending: Oncology

## 2022-02-13 ENCOUNTER — Inpatient Hospital Stay: Payer: Medicare PPO

## 2022-02-13 ENCOUNTER — Telehealth: Payer: Self-pay | Admitting: *Deleted

## 2022-02-13 ENCOUNTER — Encounter: Payer: Self-pay | Admitting: Oncology

## 2022-02-13 ENCOUNTER — Inpatient Hospital Stay (HOSPITAL_BASED_OUTPATIENT_CLINIC_OR_DEPARTMENT_OTHER): Payer: Medicare PPO | Admitting: Oncology

## 2022-02-13 VITALS — BP 116/68 | HR 78 | Temp 98.1°F | Resp 18 | Wt 186.8 lb

## 2022-02-13 DIAGNOSIS — D6481 Anemia due to antineoplastic chemotherapy: Secondary | ICD-10-CM | POA: Diagnosis not present

## 2022-02-13 DIAGNOSIS — C50412 Malignant neoplasm of upper-outer quadrant of left female breast: Secondary | ICD-10-CM | POA: Insufficient documentation

## 2022-02-13 DIAGNOSIS — Z5111 Encounter for antineoplastic chemotherapy: Secondary | ICD-10-CM | POA: Insufficient documentation

## 2022-02-13 DIAGNOSIS — Z171 Estrogen receptor negative status [ER-]: Secondary | ICD-10-CM | POA: Diagnosis not present

## 2022-02-13 DIAGNOSIS — Z79899 Other long term (current) drug therapy: Secondary | ICD-10-CM | POA: Diagnosis not present

## 2022-02-13 DIAGNOSIS — T451X5A Adverse effect of antineoplastic and immunosuppressive drugs, initial encounter: Secondary | ICD-10-CM

## 2022-02-13 DIAGNOSIS — Z5189 Encounter for other specified aftercare: Secondary | ICD-10-CM | POA: Insufficient documentation

## 2022-02-13 DIAGNOSIS — I82A11 Acute embolism and thrombosis of right axillary vein: Secondary | ICD-10-CM

## 2022-02-13 LAB — CBC WITH DIFFERENTIAL/PLATELET
Abs Immature Granulocytes: 0.07 10*3/uL (ref 0.00–0.07)
Basophils Absolute: 0 10*3/uL (ref 0.0–0.1)
Basophils Relative: 0 %
Eosinophils Absolute: 0 10*3/uL (ref 0.0–0.5)
Eosinophils Relative: 0 %
HCT: 27.7 % — ABNORMAL LOW (ref 36.0–46.0)
Hemoglobin: 9.4 g/dL — ABNORMAL LOW (ref 12.0–15.0)
Immature Granulocytes: 1 %
Lymphocytes Relative: 5 %
Lymphs Abs: 0.5 10*3/uL — ABNORMAL LOW (ref 0.7–4.0)
MCH: 32.5 pg (ref 26.0–34.0)
MCHC: 33.9 g/dL (ref 30.0–36.0)
MCV: 95.8 fL (ref 80.0–100.0)
Monocytes Absolute: 0.6 10*3/uL (ref 0.1–1.0)
Monocytes Relative: 6 %
Neutro Abs: 9.4 10*3/uL — ABNORMAL HIGH (ref 1.7–7.7)
Neutrophils Relative %: 88 %
Platelets: 415 10*3/uL — ABNORMAL HIGH (ref 150–400)
RBC: 2.89 MIL/uL — ABNORMAL LOW (ref 3.87–5.11)
RDW: 18.3 % — ABNORMAL HIGH (ref 11.5–15.5)
WBC: 10.7 10*3/uL — ABNORMAL HIGH (ref 4.0–10.5)
nRBC: 0 % (ref 0.0–0.2)

## 2022-02-13 LAB — COMPREHENSIVE METABOLIC PANEL
ALT: 16 U/L (ref 0–44)
AST: 28 U/L (ref 15–41)
Albumin: 3.5 g/dL (ref 3.5–5.0)
Alkaline Phosphatase: 61 U/L (ref 38–126)
Anion gap: 7 (ref 5–15)
BUN: 17 mg/dL (ref 8–23)
CO2: 23 mmol/L (ref 22–32)
Calcium: 8.8 mg/dL — ABNORMAL LOW (ref 8.9–10.3)
Chloride: 110 mmol/L (ref 98–111)
Creatinine, Ser: 0.73 mg/dL (ref 0.44–1.00)
GFR, Estimated: >60 mL/min (ref >60)
Glucose, Bld: 171 mg/dL — ABNORMAL HIGH (ref 70–99)
Potassium: 3.9 mmol/L (ref 3.5–5.1)
Sodium: 140 mmol/L (ref 135–145)
Total Bilirubin: 0.3 mg/dL (ref 0.3–1.2)
Total Protein: 6.4 g/dL — ABNORMAL LOW (ref 6.5–8.1)

## 2022-02-13 LAB — IRON AND TIBC
Iron: 92 ug/dL (ref 28–170)
Saturation Ratios: 32 % — ABNORMAL HIGH (ref 10.4–31.8)
TIBC: 288 ug/dL (ref 250–450)
UIBC: 196 ug/dL

## 2022-02-13 LAB — FOLATE: Folate: 26 ng/mL (ref >5.9)

## 2022-02-13 LAB — VITAMIN B12: Vitamin B-12: 3305 pg/mL — ABNORMAL HIGH (ref 180–914)

## 2022-02-13 LAB — FERRITIN: Ferritin: 406 ng/mL — ABNORMAL HIGH (ref 11–307)

## 2022-02-13 MED ORDER — SODIUM CHLORIDE 0.9 % IV SOLN
600.0000 mg/m2 | Freq: Once | INTRAVENOUS | Status: AC
  Administered 2022-02-13: 1140 mg via INTRAVENOUS
  Filled 2022-02-13: qty 57

## 2022-02-13 MED ORDER — SODIUM CHLORIDE 0.9 % IV SOLN
75.0000 mg/m2 | Freq: Once | INTRAVENOUS | Status: AC
  Administered 2022-02-13: 140 mg via INTRAVENOUS
  Filled 2022-02-13: qty 14

## 2022-02-13 MED ORDER — SODIUM CHLORIDE 0.9 % IV SOLN
Freq: Once | INTRAVENOUS | Status: AC
  Filled 2022-02-13: qty 250

## 2022-02-13 MED ORDER — SODIUM CHLORIDE 0.9 % IV SOLN
10.0000 mg | Freq: Once | INTRAVENOUS | Status: AC
  Administered 2022-02-13: 10 mg via INTRAVENOUS
  Filled 2022-02-13: qty 10

## 2022-02-13 MED ORDER — HEPARIN SOD (PORK) LOCK FLUSH 100 UNIT/ML IV SOLN
INTRAVENOUS | Status: AC
  Administered 2022-02-13: 500 [IU]
  Filled 2022-02-13: qty 5

## 2022-02-13 MED ORDER — HEPARIN SOD (PORK) LOCK FLUSH 100 UNIT/ML IV SOLN
500.0000 [IU] | Freq: Once | INTRAVENOUS | Status: DC | PRN
  Filled 2022-02-13: qty 5

## 2022-02-13 MED ORDER — APIXABAN 5 MG PO TABS: 5.0000 mg | ORAL_TABLET | Freq: Two times a day (BID) | ORAL | 2 refills | Status: DC

## 2022-02-13 MED ORDER — PALONOSETRON HCL INJECTION 0.25 MG/5ML
0.2500 mg | Freq: Once | INTRAVENOUS | Status: AC
  Administered 2022-02-13: 0.25 mg via INTRAVENOUS
  Filled 2022-02-13: qty 5

## 2022-02-13 NOTE — Progress Notes (Signed)
Pt states she has noticed some more swelling in rt arm along with ankles and feet.

## 2022-02-13 NOTE — Progress Notes (Signed)
   Hematology/Oncology Consult note [facility]  [phone] [phone]  Patient Care Team: Tower, Marne A, MD as PCP - General Moore, Ana K, RN as Oncology Nurse Navigator   Name of the patient: Megan Harrison  [id number]  03/06/1956   Date of visit: 02/13/22  Diagnosis- pathological prognostic stage Ia invasive mammary carcinoma of the left breast pT1b N0 M0 ER/PR HER2 negative  Chief complaint/ Reason for visit-on treatment assessment prior to cycle 4 of adjuvant TC chemotherapy  Heme/Onc history: Patient is a 66-year-old female who underwent a screening bilateral mammogram in May 2023 which showed a possible asymmetry in the left breast.  This was followed by diagnostic mammogram and ultrasound which showed a hypoechoic mass in the left breast 3 x 4 x 6 mm.  No abnormality noted in the left axillary lymph nodes.  This was biopsied and was consistent with invasive mammary carcinoma 4 mm grade 3 ER negative less than 1%, PR negative less than 1% and HER2 negative.   Final pathology showed 8 mm grade 3 triple negative tumor with negative margins.3 sentinel lymph nodes negative for malignancy.  Plan is for adjuvant TC chemotherapy x4 cycles followed by radiation   Patient on to have extensive right upper extremity DVT related to port placement after 2 cycles of chemotherapy.  She is currently on Eliquis and also saw vascular surgery and s/p thrombolysis and thrombectomy    Interval history-continues to have right upper extremity swelling despite thrombolysis.  Also reports bilateral lower extremity swelling  ECOG PS- 1 Pain scale- 0   Review of systems- Review of Systems  Constitutional:  Positive for malaise/fatigue. Negative for chills, fever and weight loss.  HENT:  Negative for congestion, ear discharge and nosebleeds.   Eyes:  Negative for blurred vision.  Respiratory:  Negative for cough, hemoptysis, sputum production, shortness of  breath and wheezing.   Cardiovascular:  Positive for leg swelling. Negative for chest pain, palpitations, orthopnea and claudication.  Gastrointestinal:  Negative for abdominal pain, blood in stool, constipation, diarrhea, heartburn, melena, nausea and vomiting.  Genitourinary:  Negative for dysuria, flank pain, frequency, hematuria and urgency.  Musculoskeletal:  Negative for back pain, joint pain and myalgias.       Right arm swelling  Skin:  Negative for rash.  Neurological:  Negative for dizziness, tingling, focal weakness, seizures, weakness and headaches.  Endo/Heme/Allergies:  Does not bruise/bleed easily.  Psychiatric/Behavioral:  Negative for depression and suicidal ideas. The patient does not have insomnia.       Allergies  Allergen Reactions   Codeine     REACTION: syncope   Sumatriptan     REACTION: choking sensation     Past Medical History:  Diagnosis Date   Anemia    Cutaneous sarcoidosis    Degenerative disc disease    Diverticulosis    Dysplastic nevus 11/19/2017   L mid back 6.0 cm lat to spine   Foot pain    GERD (gastroesophageal reflux disease)    Hyperlipidemia    Malignant neoplasm of upper-outer quadrant of left breast in female, estrogen receptor negative (HCC) 11/04/2021   Migraine    Shingles    eyes     Past Surgical History:  Procedure Laterality Date   2D Echo  1999   Negative   ABDOMINAL HYSTERECTOMY  03/1998   APPENDECTOMY     Arm lesion, cutaneous sarcoid  12/2005   with normal CXR and ANA   BREAST BIOPSY Left   10/23/2021   us biopsy/ heart clip/ [facility]   BREAST LUMPECTOMY WITH RADIOFREQUENCY TAG IDENTIFICATION Left 11/13/2021   lt br rf tag placement   BREAST LUMPECTOMY,RADIO FREQ LOCALIZER,AXILLARY SENTINEL LYMPH NODE BIOPSY Left 11/20/2021   Procedure: BREAST LUMPECTOMY,RADIO FREQ LOCALIZER,AXILLARY SENTINEL LYMPH NODE BIOPSY;  Surgeon: Piscoya, Jose, MD;  Location: [facility] ORS;  Service: General;  Laterality: Left;   CESAREAN SECTION      x2   COLONOSCOPY  11/0/2009,08/18/13   diverticulosis and hemorrhoids   DIAGNOSTIC LAPAROSCOPY     EYE SURGERY  10/13/2018   had shingles in left eye, had to have surgery, has contact lens in now.   FOOT NEUROMA SURGERY Left    LAPAROSCOPIC APPENDECTOMY N/A 02/18/2016   Procedure: APPENDECTOMY LAPAROSCOPIC;  Surgeon: Armando Ramirez, MD;  Location: [facility] OR;  Service: General;  Laterality: N/A;   LUMBAR LAMINECTOMY  10/2017   PERIPHERAL VASCULAR THROMBECTOMY Right 01/16/2022   Procedure: PERIPHERAL VASCULAR THROMBECTOMY;  Surgeon: Dew, Jason S, MD;  Location: [facility] INVASIVE CV LAB;  Service: Cardiovascular;  Laterality: Right;   PORTACATH PLACEMENT Right 12/05/2021   Procedure: INSERTION PORT-A-CATH;  Surgeon: Piscoya, Jose, MD;  Location: [facility] ORS;  Service: General;  Laterality: Right;   ROTATOR CUFF REPAIR Left    TONSILLECTOMY     UPPER GASTROINTESTINAL ENDOSCOPY  11/15/2010   non-erosive gastritis, H. pylori negative    Social History   Socioeconomic History   Marital status: Married    Spouse name: Not on file   Number of children: Not on file   Years of education: Not on file   Highest education level: Not on file  Occupational History   Occupation: Works for [employer]    Employer: RETIRED  Tobacco Use   Smoking status: Never   Smokeless tobacco: Never  Vaping Use   Vaping Use: Never used  Substance and Sexual Activity   Alcohol use: No    Alcohol/week: 0.0 standard drinks of alcohol   Drug use: No   Sexual activity: Yes  Other Topics Concern   Not on file  Social History Narrative   Regular exercise:  Curves, elliptical, walking.   Social Determinants of Health   Financial Resource Strain: Low Risk  (02/01/2020)   Overall Financial Resource Strain (CARDIA)    Difficulty of Paying Living Expenses: Not hard at all  Food Insecurity: No Food Insecurity (02/01/2020)   Hunger Vital Sign    Worried About Running Out of Food in the Last Year: Never true    Ran  Out of Food in the Last Year: Never true  Transportation Needs: No Transportation Needs (02/01/2020)   PRAPARE - Transportation    Lack of Transportation (Medical): No    Lack of Transportation (Non-Medical): No  Physical Activity: Insufficiently Active (02/01/2020)   Exercise Vital Sign    Days of Exercise per Week: 4 days    Minutes of Exercise per Session: 30 min  Stress: No Stress Concern Present (02/01/2020)   [employer] - Occupational Stress Questionnaire    Feeling of Stress : Not at all  Social Connections: Not on file  Intimate Partner Violence: Not At Risk (02/01/2020)   Humiliation, Afraid, Rape, and Kick questionnaire    Fear of Current or Ex-Partner: No    Emotionally Abused: No    Physically Abused: No    Sexually Abused: No    Family History  Problem Relation Age of Onset   Colon cancer Brother          early 60's   Stomach cancer Brother    Heart disease Mother        CAD   Alcohol abuse Mother    Lung cancer Father    Heart disease Brother        CAD   Throat cancer Brother    Heart disease Brother        CAD, pre diabetic, obese   Colon polyps Sister    Breast cancer Neg Hx    Rectal cancer Neg Hx      Current Outpatient Medications:    acetaminophen (TYLENOL) 500 MG tablet, Take 2 tablets (1,000 mg total) by mouth every 6 (six) hours as needed for mild pain., Disp: , Rfl:    APIXABAN (ELIQUIS) VTE STARTER PACK (10MG AND 5MG), Take as directed on package: start with two-5mg tablets twice daily for 7 days. On day 8, switch to one-5mg tablet twice daily. Stop 81 mg aspirin and Mobic while on this medication, Disp: 1 each, Rfl: 0   atorvastatin (LIPITOR) 10 MG tablet, Take 1 tablet (10 mg total) by mouth daily., Disp: 90 tablet, Rfl: 3   calcium carbonate (TUMS - DOSED IN MG ELEMENTAL CALCIUM) 500 MG chewable tablet, Chew 1 tablet by mouth as needed for indigestion or heartburn., Disp: , Rfl:    Cholecalciferol (VITAMIN D3) 50 MCG  (2000 UT) TABS, Take 1 tablet by mouth daily., Disp: , Rfl:    ferrous sulfate 325 (65 FE) MG tablet, Take 325 mg by mouth daily., Disp: , Rfl:    fluticasone (FLONASE) 50 MCG/ACT nasal spray, USE ONE SPRAY(S) IN EACH NOSTRIL ONCE DAILY AS DIRECTED (Patient taking differently: 1 spray at bedtime. USE ONE SPRAY(S) IN EACH NOSTRIL ONCE DAILY AS DIRECTED), Disp: 48 g, Rfl: 3   gabapentin (NEURONTIN) 300 MG capsule, Take 300 mg by mouth daily after supper., Disp: , Rfl:    Magnesium Hydroxide (DULCOLAX PO), Take 1 tablet by mouth 2 (two) times daily., Disp: , Rfl:    Multiple Vitamins-Minerals (ONE-A-DAY WOMENS 50 PLUS PO), Take 1 tablet by mouth daily., Disp: , Rfl:    pantoprazole (PROTONIX) 20 MG tablet, Take 1 tablet (20 mg total) by mouth daily., Disp: 30 tablet, Rfl: 2   prednisoLONE acetate (PRED FORTE) 1 % ophthalmic suspension, Place 1 drop into the left eye every other day., Disp: , Rfl:    Timolol Maleate 0.5 % (DAILY) SOLN, Place 1 drop into the left eye daily at 6 (six) AM., Disp: , Rfl:  No current facility-administered medications for this visit.  Facility-Administered Medications Ordered in Other Visits:    heparin lock flush 100 UNIT/ML injection, , , ,    heparin lock flush 100 unit/mL, 500 Units, Intracatheter, Once PRN, Rao, Archana C, MD   palonosetron (ALOXI) 0.25 MG/5ML injection, , , ,   Physical exam:  Vitals:   02/13/22 0916  BP: 116/68  Pulse: 78  Resp: 18  Temp: 98.1 F (36.7 C)  SpO2: 97%  Weight: 186 lb 12.8 oz (84.7 kg)   Physical Exam Constitutional:      General: She is not in acute distress. Cardiovascular:     Rate and Rhythm: Normal rate and regular rhythm.     Heart sounds: Normal heart sounds.  Pulmonary:     Effort: Pulmonary effort is normal.     Breath sounds: Normal breath sounds.  Abdominal:     General: Bowel sounds are normal.     Palpations: Abdomen is soft.  Musculoskeletal:       Right lower leg: Edema present.     Left lower leg:  Edema present.     Comments: Right upper extremity appears swollen  Skin:    General: Skin is warm and dry.  Neurological:     Mental Status: She is alert and oriented to person, place, and time.         Latest Ref Rng & Units 02/13/2022    9:00 AM  CMP  Glucose 70 - 99 mg/dL 171   BUN 8 - 23 mg/dL 17   Creatinine 0.44 - 1.00 mg/dL 0.73   Sodium 135 - 145 mmol/L 140   Potassium 3.5 - 5.1 mmol/L 3.9   Chloride 98 - 111 mmol/L 110   CO2 22 - 32 mmol/L 23   Calcium 8.9 - 10.3 mg/dL 8.8   Total Protein 6.5 - 8.1 g/dL 6.4   Total Bilirubin 0.3 - 1.2 mg/dL 0.3   Alkaline Phos 38 - 126 U/L 61   AST 15 - 41 U/L 28   ALT 0 - 44 U/L 16       Latest Ref Rng & Units 02/13/2022    9:00 AM  CBC  WBC 4.0 - 10.5 K/uL 10.7   Hemoglobin 12.0 - 15.0 g/dL 9.4   Hematocrit 36.0 - 46.0 % 27.7   Platelets 150 - 400 K/uL 415     No images are attached to the encounter.  PERIPHERAL VASCULAR CATHETERIZATION  Result Date: 01/16/2022 See surgical note for result.  US Venous Img Upper Uni Right  Result Date: 01/14/2022 CLINICAL DATA:  Right upper extremity edema EXAM: Right UPPER EXTREMITY VENOUS DOPPLER ULTRASOUND TECHNIQUE: Gray-scale sonography with graded compression, as well as color Doppler and duplex ultrasound were performed to evaluate the upper extremity deep venous system from the level of the subclavian vein and including the jugular, axillary, basilic, radial, ulnar and upper cephalic vein. Spectral Doppler was utilized to evaluate flow at rest and with distal augmentation maneuvers. COMPARISON:  None Available. FINDINGS: Contralateral Subclavian Vein: Respiratory phasicity is normal and asymmetric with the symptomatic side. No evidence of thrombus. Normal compressibility. Internal Jugular Vein: No evidence of thrombus. Normal compressibility, respiratory phasicity and response to augmentation. Subclavian Vein: Noncompressible with nearly completely occlusive thrombus. Axillary Vein:  Noncompressible with nearly completely occlusive thrombus. Cephalic Vein: Noncompressible in the proximal portion with nearly completely occlusive thrombus. Patent with no evidence of thrombus in the mid and distal portions with normal compressibility, respiratory phasicity, and response to augmentation. Basilic Vein: Noncompressible with nonocclusive thrombus in the proximal portion. Patent in the mid and distal portions Brachial Veins: 1 of the paired brachial veins is noncompressible with occlusive thrombus throughout. Radial Veins: No evidence of thrombus. Normal compressibility, respiratory phasicity and response to augmentation. Ulnar Veins: No evidence of thrombus. Normal compressibility, respiratory phasicity and response to augmentation. Venous Reflux:  None visualized. Other Findings:  None visualized. IMPRESSION: 1. Nearly occlusive DVT within 1 of the paired right brachial veins extending into the right axillary and subclavian veins. 2. Near occlusive thrombi within the proximal cephalic and proximal basilic veins. Critical Value/emergent results were called by telephone at the time of interpretation on 01/14/2022 at 3:19 pm to provider SARAH COVINGTON , who verbally acknowledged these results. Electronically Signed   By: Limin  Xu M.D.   On: 01/14/2022 15:20     Assessment and plan- Patient is a 66 y.o. female with stage I triple negative left breast cancer pT1b N0 M0.  She is here for on treatment   assessment prior to cycle 4 of adjuvant TC chemotherapy  Counts okay to proceed with cycle 4 of adjuvant TC chemotherapy today with Udenyca on day 3.  This would be her last chemotherapy.  Her port will be taken out 2 weeks from now as it was associated with her right upper extremity DVT.  I will have her stay on anticoagulation for at least 3 months in total.  Bilateral lower extremity edema: Likely secondary to Taxotere.  I have recommended that patient should use compressionStockings and I expect  her swelling to get better after chemotherapy is completed  Chemo-induced anemia: Stable continue to monitor   Visit Diagnosis 1. Encounter for antineoplastic chemotherapy   2. Malignant neoplasm of upper-outer quadrant of left breast in female, estrogen receptor negative ([place])   3. Acute deep vein thrombosis (DVT) of axillary vein of right upper extremity (HCC)   4. Antineoplastic chemotherapy induced anemia      Dr. Randa Evens, MD, MPH [facility] [phone] 02/13/2022 12:58 PM

## 2022-02-13 NOTE — Patient Instructions (Addendum)
[facility] CANCER CTR AT [place]  Discharge Instructions: Thank you for choosing [facility] to provide your oncology and hematology care.  If you have a lab appointment with the [facility], please go directly to the [facility] and check in at the registration area.  Wear comfortable clothing and clothing appropriate for easy access to any Portacath or PICC line.   We strive to give you quality time with your provider. You may need to reschedule your appointment if you arrive late (15 or more minutes).  Arriving late affects you and other patients whose appointments are after yours.  Also, if you miss three or more appointments without notifying the office, you may be dismissed from the clinic at the provider's discretion.      For prescription refill requests, have your pharmacy contact our office and allow 72 hours for refills to be completed.    Today you received the following chemotherapy and/or immunotherapy agents: Taxotere, Cytoxan      To help prevent nausea and vomiting after your treatment, we encourage you to take your nausea medication as directed.  BELOW ARE SYMPTOMS THAT SHOULD BE REPORTED IMMEDIATELY: *FEVER GREATER THAN 100.4 F (38 C) OR HIGHER *CHILLS OR SWEATING *NAUSEA AND VOMITING THAT IS NOT CONTROLLED WITH YOUR NAUSEA MEDICATION *UNUSUAL SHORTNESS OF BREATH *UNUSUAL BRUISING OR BLEEDING *URINARY PROBLEMS (pain or burning when urinating, or frequent urination) *BOWEL PROBLEMS (unusual diarrhea, constipation, pain near the anus) TENDERNESS IN MOUTH AND THROAT WITH OR WITHOUT PRESENCE OF ULCERS (sore throat, sores in mouth, or a toothache) UNUSUAL RASH, SWELLING OR PAIN  UNUSUAL VAGINAL DISCHARGE OR ITCHING   Items with * indicate a potential emergency and should be followed up as soon as possible or go to the Emergency Department if any problems should occur.  Please show the CHEMOTHERAPY ALERT CARD or IMMUNOTHERAPY ALERT CARD at  check-in to the Emergency Department and triage nurse.  Should you have questions after your visit or need to cancel or reschedule your appointment, please contact [facility] CANCER [place] AT [place]  [phone] and follow the prompts.  Office hours are 8:00 a.m. to 4:30 p.m. Monday - Friday. Please note that voicemails left after 4:00 p.m. may not be returned until the following business day.  We are closed weekends and major holidays. You have access to a nurse at all times for urgent questions. Please call the main number to the clinic [phone] and follow the prompts.  For any non-urgent questions, you may also contact your provider using MyChart. We now offer e-Visits for anyone 75 and older to request care online for non-urgent symptoms. For details visit [email or web address].   Also download the MyChart app! Go to the app store, search "MyChart", open the app, select [facility], and log in with your MyChart username and password.  Masks are optional in the cancer centers. If you would like for your care team to wear a mask while they are taking care of you, please let them know. For doctor visits, patients may have with them one support person who is at least 68 years old. At this time, visitors are not allowed in the infusion area. \[id number]\

## 2022-02-13 NOTE — Telephone Encounter (Signed)
Called the pt to let her know that eliquis was sent in with refill. Also I thought she had asked in the past  about helping with drugs and I thought I could but her insurance is a medicare product and it does not allow help with medicare. The amount of money usualls goes down with most refills though. All of this on voicemail due to pt not answer phone.

## 2022-02-14 ENCOUNTER — Other Ambulatory Visit: Payer: Self-pay

## 2022-02-15 ENCOUNTER — Inpatient Hospital Stay: Payer: Medicare PPO

## 2022-02-15 ENCOUNTER — Other Ambulatory Visit: Payer: Self-pay

## 2022-02-15 ENCOUNTER — Ambulatory Visit (INDEPENDENT_AMBULATORY_CARE_PROVIDER_SITE_OTHER): Payer: Medicare PPO | Admitting: Vascular Surgery

## 2022-02-15 VITALS — Temp 97.8°F

## 2022-02-15 DIAGNOSIS — C50412 Malignant neoplasm of upper-outer quadrant of left female breast: Secondary | ICD-10-CM | POA: Diagnosis not present

## 2022-02-15 DIAGNOSIS — Z171 Estrogen receptor negative status [ER-]: Secondary | ICD-10-CM | POA: Diagnosis not present

## 2022-02-15 DIAGNOSIS — Z5111 Encounter for antineoplastic chemotherapy: Secondary | ICD-10-CM | POA: Diagnosis not present

## 2022-02-15 DIAGNOSIS — Z5189 Encounter for other specified aftercare: Secondary | ICD-10-CM | POA: Diagnosis not present

## 2022-02-15 DIAGNOSIS — Z79899 Other long term (current) drug therapy: Secondary | ICD-10-CM | POA: Diagnosis not present

## 2022-02-15 MED ORDER — PEGFILGRASTIM-CBQV 6 MG/0.6ML ~~LOC~~ SOSY
6.0000 mg | PREFILLED_SYRINGE | Freq: Once | SUBCUTANEOUS | Status: AC
  Administered 2022-02-15: 6 mg via SUBCUTANEOUS
  Filled 2022-02-15: qty 0.6

## 2022-02-20 ENCOUNTER — Ambulatory Visit
Admission: RE | Admit: 2022-02-20 | Discharge: 2022-02-20 | Disposition: A | Discharge status: Home / Self Care | Payer: Medicare PPO | Source: Ambulatory Visit | Attending: Radiation Oncology | Admitting: Radiation Oncology

## 2022-02-20 ENCOUNTER — Other Ambulatory Visit: Payer: Self-pay

## 2022-02-20 DIAGNOSIS — Z800 Family history of malignant neoplasm of digestive organs: Secondary | ICD-10-CM | POA: Diagnosis not present

## 2022-02-20 DIAGNOSIS — Z7901 Long term (current) use of anticoagulants: Secondary | ICD-10-CM | POA: Diagnosis not present

## 2022-02-20 DIAGNOSIS — Z7982 Long term (current) use of aspirin: Secondary | ICD-10-CM | POA: Diagnosis not present

## 2022-02-20 DIAGNOSIS — Z79899 Other long term (current) drug therapy: Secondary | ICD-10-CM | POA: Diagnosis not present

## 2022-02-20 DIAGNOSIS — Z170 Estrogen receptor positive status [ER+]: Secondary | ICD-10-CM | POA: Diagnosis not present

## 2022-02-20 DIAGNOSIS — Z801 Family history of malignant neoplasm of trachea, bronchus and lung: Secondary | ICD-10-CM | POA: Insufficient documentation

## 2022-02-20 DIAGNOSIS — Z9221 Personal history of antineoplastic chemotherapy: Secondary | ICD-10-CM | POA: Insufficient documentation

## 2022-02-20 DIAGNOSIS — Z86718 Personal history of other venous thrombosis and embolism: Secondary | ICD-10-CM | POA: Diagnosis not present

## 2022-02-20 DIAGNOSIS — C50412 Malignant neoplasm of upper-outer quadrant of left female breast: Secondary | ICD-10-CM | POA: Insufficient documentation

## 2022-02-20 DIAGNOSIS — Z171 Estrogen receptor negative status [ER-]: Secondary | ICD-10-CM | POA: Insufficient documentation

## 2022-02-20 DIAGNOSIS — E785 Hyperlipidemia, unspecified: Secondary | ICD-10-CM | POA: Diagnosis not present

## 2022-02-20 DIAGNOSIS — K219 Gastro-esophageal reflux disease without esophagitis: Secondary | ICD-10-CM | POA: Insufficient documentation

## 2022-02-20 DIAGNOSIS — Z791 Long term (current) use of non-steroidal anti-inflammatories (NSAID): Secondary | ICD-10-CM | POA: Diagnosis not present

## 2022-02-22 ENCOUNTER — Other Ambulatory Visit: Payer: Self-pay | Admitting: *Deleted

## 2022-02-22 ENCOUNTER — Ambulatory Visit: Payer: Medicare PPO

## 2022-02-22 DIAGNOSIS — C50412 Malignant neoplasm of upper-outer quadrant of left female breast: Secondary | ICD-10-CM

## 2022-02-25 ENCOUNTER — Encounter: Payer: Self-pay | Admitting: *Deleted

## 2022-02-25 DIAGNOSIS — Z171 Estrogen receptor negative status [ER-]: Secondary | ICD-10-CM | POA: Diagnosis not present

## 2022-02-25 DIAGNOSIS — Z791 Long term (current) use of non-steroidal anti-inflammatories (NSAID): Secondary | ICD-10-CM | POA: Diagnosis not present

## 2022-02-25 DIAGNOSIS — Z9221 Personal history of antineoplastic chemotherapy: Secondary | ICD-10-CM | POA: Diagnosis not present

## 2022-02-25 DIAGNOSIS — Z86718 Personal history of other venous thrombosis and embolism: Secondary | ICD-10-CM | POA: Diagnosis not present

## 2022-02-25 DIAGNOSIS — Z79899 Other long term (current) drug therapy: Secondary | ICD-10-CM | POA: Diagnosis not present

## 2022-02-25 DIAGNOSIS — Z7982 Long term (current) use of aspirin: Secondary | ICD-10-CM | POA: Diagnosis not present

## 2022-02-25 DIAGNOSIS — E785 Hyperlipidemia, unspecified: Secondary | ICD-10-CM | POA: Diagnosis not present

## 2022-02-25 DIAGNOSIS — C50412 Malignant neoplasm of upper-outer quadrant of left female breast: Secondary | ICD-10-CM | POA: Diagnosis not present

## 2022-02-25 DIAGNOSIS — Z170 Estrogen receptor positive status [ER+]: Secondary | ICD-10-CM | POA: Diagnosis not present

## 2022-02-25 DIAGNOSIS — K219 Gastro-esophageal reflux disease without esophagitis: Secondary | ICD-10-CM | POA: Diagnosis not present

## 2022-02-27 ENCOUNTER — Ambulatory Visit: Admission: RE | Admit: 2022-02-27 | Payer: Medicare PPO | Source: Ambulatory Visit

## 2022-02-27 DIAGNOSIS — C50412 Malignant neoplasm of upper-outer quadrant of left female breast: Secondary | ICD-10-CM | POA: Diagnosis not present

## 2022-02-27 DIAGNOSIS — Z7982 Long term (current) use of aspirin: Secondary | ICD-10-CM | POA: Diagnosis not present

## 2022-02-27 DIAGNOSIS — Z791 Long term (current) use of non-steroidal anti-inflammatories (NSAID): Secondary | ICD-10-CM | POA: Diagnosis not present

## 2022-02-27 DIAGNOSIS — Z79899 Other long term (current) drug therapy: Secondary | ICD-10-CM | POA: Diagnosis not present

## 2022-02-27 DIAGNOSIS — Z170 Estrogen receptor positive status [ER+]: Secondary | ICD-10-CM | POA: Diagnosis not present

## 2022-02-27 DIAGNOSIS — Z86718 Personal history of other venous thrombosis and embolism: Secondary | ICD-10-CM | POA: Diagnosis not present

## 2022-02-27 DIAGNOSIS — Z171 Estrogen receptor negative status [ER-]: Secondary | ICD-10-CM | POA: Diagnosis not present

## 2022-02-27 DIAGNOSIS — Z510 Encounter for antineoplastic radiation therapy: Secondary | ICD-10-CM | POA: Diagnosis not present

## 2022-02-27 DIAGNOSIS — K219 Gastro-esophageal reflux disease without esophagitis: Secondary | ICD-10-CM | POA: Diagnosis not present

## 2022-02-27 DIAGNOSIS — E785 Hyperlipidemia, unspecified: Secondary | ICD-10-CM | POA: Diagnosis not present

## 2022-02-27 DIAGNOSIS — Z9221 Personal history of antineoplastic chemotherapy: Secondary | ICD-10-CM | POA: Diagnosis not present

## 2022-02-28 ENCOUNTER — Ambulatory Visit
Admission: RE | Admit: 2022-02-28 | Discharge: 2022-02-28 | Disposition: A | Discharge status: Home / Self Care | Payer: Medicare PPO | Attending: Vascular Surgery | Admitting: Vascular Surgery

## 2022-02-28 ENCOUNTER — Encounter
Admission: RE | Disposition: A | Discharge status: Home / Self Care | Payer: Self-pay | Source: Home / Self Care | Attending: Vascular Surgery

## 2022-02-28 ENCOUNTER — Ambulatory Visit: Payer: Medicare PPO

## 2022-02-28 ENCOUNTER — Other Ambulatory Visit: Payer: Self-pay

## 2022-02-28 ENCOUNTER — Encounter: Payer: Self-pay | Admitting: Vascular Surgery

## 2022-02-28 DIAGNOSIS — T82858A Stenosis of vascular prosthetic devices, implants and grafts, initial encounter: Secondary | ICD-10-CM

## 2022-02-28 DIAGNOSIS — Z452 Encounter for adjustment and management of vascular access device: Secondary | ICD-10-CM | POA: Diagnosis not present

## 2022-02-28 DIAGNOSIS — I82B11 Acute embolism and thrombosis of right subclavian vein: Secondary | ICD-10-CM | POA: Diagnosis not present

## 2022-02-28 DIAGNOSIS — I82409 Acute embolism and thrombosis of unspecified deep veins of unspecified lower extremity: Secondary | ICD-10-CM

## 2022-02-28 DIAGNOSIS — C50412 Malignant neoplasm of upper-outer quadrant of left female breast: Secondary | ICD-10-CM | POA: Insufficient documentation

## 2022-02-28 DIAGNOSIS — I82621 Acute embolism and thrombosis of deep veins of right upper extremity: Secondary | ICD-10-CM | POA: Insufficient documentation

## 2022-02-28 DIAGNOSIS — E785 Hyperlipidemia, unspecified: Secondary | ICD-10-CM | POA: Insufficient documentation

## 2022-02-28 DIAGNOSIS — I82A11 Acute embolism and thrombosis of right axillary vein: Secondary | ICD-10-CM | POA: Diagnosis not present

## 2022-02-28 DIAGNOSIS — Z9221 Personal history of antineoplastic chemotherapy: Secondary | ICD-10-CM | POA: Insufficient documentation

## 2022-02-28 DIAGNOSIS — C50912 Malignant neoplasm of unspecified site of left female breast: Secondary | ICD-10-CM

## 2022-02-28 DIAGNOSIS — Z171 Estrogen receptor negative status [ER-]: Secondary | ICD-10-CM | POA: Diagnosis not present

## 2022-02-28 HISTORY — PX: PORTA CATH REMOVAL: CATH118286

## 2022-02-28 SURGERY — PORTA CATH REMOVAL
Anesthesia: Moderate Sedation

## 2022-02-28 MED ORDER — ONDANSETRON HCL 4 MG/2ML IJ SOLN: 4.0000 mg | Freq: Four times a day (QID) | INTRAMUSCULAR | Status: DC | PRN

## 2022-02-28 MED ORDER — DIPHENHYDRAMINE HCL 50 MG/ML IJ SOLN: 50.0000 mg | Freq: Once | INTRAMUSCULAR | Status: DC | PRN

## 2022-02-28 MED ORDER — MIDAZOLAM HCL 2 MG/2ML IJ SOLN
INTRAMUSCULAR | Status: DC | PRN
  Administered 2022-02-28: 2 mg via INTRAVENOUS
  Administered 2022-02-28: 1 mg via INTRAVENOUS
  Administered 2022-02-28: .5 mg via INTRAVENOUS

## 2022-02-28 MED ORDER — HEPARIN SODIUM (PORCINE) 1000 UNIT/ML IJ SOLN
INTRAMUSCULAR | Status: DC | PRN
  Administered 2022-02-28: 3000 [IU] via INTRAVENOUS

## 2022-02-28 MED ORDER — SODIUM CHLORIDE 0.9 % IV SOLN: INTRAVENOUS | Status: DC

## 2022-02-28 MED ORDER — HYDROMORPHONE HCL 1 MG/ML IJ SOLN: 1.0000 mg | Freq: Once | INTRAMUSCULAR | Status: DC | PRN

## 2022-02-28 MED ORDER — FENTANYL CITRATE (PF) 100 MCG/2ML IJ SOLN
INTRAMUSCULAR | Status: DC | PRN
  Administered 2022-02-28: 50 ug via INTRAVENOUS
  Administered 2022-02-28: 12.5 ug via INTRAVENOUS
  Administered 2022-02-28: 25 ug via INTRAVENOUS

## 2022-02-28 MED ORDER — FENTANYL CITRATE PF 50 MCG/ML IJ SOSY
PREFILLED_SYRINGE | INTRAMUSCULAR | Status: AC
  Filled 2022-02-28: qty 1

## 2022-02-28 MED ORDER — CEFAZOLIN SODIUM-DEXTROSE 2-4 GM/100ML-% IV SOLN
INTRAVENOUS | Status: AC
  Filled 2022-02-28: qty 100

## 2022-02-28 MED ORDER — HEPARIN SODIUM (PORCINE) 1000 UNIT/ML IJ SOLN
INTRAMUSCULAR | Status: AC
  Filled 2022-02-28: qty 10

## 2022-02-28 MED ORDER — CEFAZOLIN SODIUM-DEXTROSE 2-4 GM/100ML-% IV SOLN
2.0000 g | INTRAVENOUS | Status: AC
  Administered 2022-02-28: 2 g via INTRAVENOUS

## 2022-02-28 MED ORDER — METHYLPREDNISOLONE SODIUM SUCC 125 MG IJ SOLR: 125.0000 mg | Freq: Once | INTRAMUSCULAR | Status: DC | PRN

## 2022-02-28 MED ORDER — FAMOTIDINE 20 MG PO TABS: 40.0000 mg | ORAL_TABLET | Freq: Once | ORAL | Status: DC | PRN

## 2022-02-28 MED ORDER — MIDAZOLAM HCL 2 MG/2ML IJ SOLN
INTRAMUSCULAR | Status: AC
  Filled 2022-02-28: qty 2

## 2022-02-28 MED ORDER — MIDAZOLAM HCL 2 MG/ML PO SYRP: 8.0000 mg | ORAL_SOLUTION | Freq: Once | ORAL | Status: DC | PRN

## 2022-02-28 SURGICAL SUPPLY — 19 items
ADH SKN CLS APL DERMABOND .7 (GAUZE/BANDAGES/DRESSINGS) ×1
BALLN DORADO 10X40X80 (BALLOONS) ×1
BALLN DORADO 8X60X80 (BALLOONS) ×1
BALLOON DORADO 10X40X80 (BALLOONS) IMPLANT
BALLOON DORADO 8X60X80 (BALLOONS) IMPLANT
CANISTER PENUMBRA ENGINE (MISCELLANEOUS) IMPLANT
CANNULA 5F STIFF (CANNULA) IMPLANT
CATH BEACON 5 .035 65 KMP TIP (CATHETERS) IMPLANT
CATH INDIGO 12XTORQ 100 (CATHETERS) IMPLANT
DERMABOND ADVANCED .7 DNX12 (GAUZE/BANDAGES/DRESSINGS) IMPLANT
DRAPE BRACHIAL (DRAPES) IMPLANT
KIT ENCORE 26 ADVANTAGE (KITS) IMPLANT
PACK ANGIOGRAPHY (CUSTOM PROCEDURE TRAY) IMPLANT
SHEATH BRITE TIP 7FRX5.5 (SHEATH) IMPLANT
SHEATH PINNACLE 11FRX10 (SHEATH) IMPLANT
SUT MNCRL AB 4-0 PS2 18 (SUTURE) IMPLANT
SUT VIC AB 3-0 SH 27 (SUTURE) ×1
SUT VIC AB 3-0 SH 27X BRD (SUTURE) IMPLANT
WIRE MAGIC TOR.035 180C (WIRE) IMPLANT

## 2022-02-28 NOTE — Discharge Instructions (Signed)
Venogram, Care After This sheet gives you information about how to care for yourself after your procedure. Your health care provider may also give you more specific instructions. If you have problems or questions, contact your health care provider. What can I expect after the procedure? After the procedure, it is common to have: Bruising or mild discomfort in the area where the IV was inserted (insertion site).  Follow these instructions at home: Eating and drinking Follow instructions from your health care provider about eating or drinking restrictions. Drink a lot of fluids for the first several days after the procedure, as directed by your health care provider. This helps to wash (flush) the contrast out of your body. Examples of healthy fluids include water or low-calorie drinks. General instructions Check your right arm venous site  for: Redness, swelling, or pain. Fluid or blood. Warmth. Pus or a bad smell. Take over-the-counter and prescription medicines only as told by your health care provider. Rest and return to your normal activities as told by your health care provider. Ask your health care provider what activities are safe for you. Do not drive for 24 hours  Keep all follow-up visits as told by your health care provider. This is important. You may remove the dressing from your right arm vein site in 2 days  Contact a health care provider if: Your skin becomes itchy or you develop a rash or hives. You have a fever that does not get better with medicine. You feel nauseous. You vomit. You have redness, swelling, or pain around the insertion site. You have fluid or blood coming from the insertion site. Your insertion area feels warm to the touch. You have pus or a bad smell coming from the insertion site. Get help right away if: You have difficulty breathing or shortness of breath. You develop chest pain. You faint. You feel very dizzy. These symptoms may represent a  serious problem that is an emergency. Do not wait to see if the symptoms will go away. Get medical help right away. Call your local emergency services (911 in the [place] Do not drive yourself to the hospital. Summary After your procedure, it is common to have bruising or mild discomfort in the area where the IV was inserted. You should check your IV insertion area every day for signs of infection. Take over-the-counter and prescription medicines only as told by your health care provider. You should drink a lot of fluids for the first several days after the procedure to help flush the contrast from your body. This information is not intended to replace advice given to you by your health care provider. Make sure you discuss any questions you have with your health care provider. Document Released: 02/10/2013 Document Revised: 03/16/2016 Document Reviewed: 03/16/2016 Elsevier Interactive Patient Education  2017 [employer].

## 2022-02-28 NOTE — Op Note (Signed)
[facility]       Operative Note  Date: 02/28/2022  Preoperative diagnosis:  1. Breast Cancer 2.  Right subclavian Port-A-Cath with thrombosis and occlusion  Postoperative diagnosis:  Same as above  Procedures: #1. Removal of right jugular port a cath  #2.  Ultrasound guidance for vascular access right basilic vein #3.  Right upper extremity venogram and superior venacavogram. #4.  Mechanical thrombectomy of the right subclavian and innominate veins with the penumbra CAT 12 device #5.  Angioplasty of the right subclavian and innominate veins with 8 and 10 mm diameter angioplasty balloons  Surgeon: [name], MD  Anesthesia: Local with moderate conscious sedation for 50 minutes using 3.5 mg of Versed and 87.5 mcg of Fentanyl  Fluoroscopy time: 4.1 minutes  Contrast used: 25 cc  Estimated blood loss: 100 cc  Indication for the procedure:  The patient is a 66 y.o. female who has completed her chemotherapy for her breast cancer and no longer needs their Port-A-Cath.  She has also had a catheter related thrombus secondary to her Port-A-Cath.  We performed a thrombectomy but new that this would still be a problem to the port to be removed and we plan to perform another thrombectomy and treatment for her likely subclavian vein thrombus and stenosis.  The patient desires to have this removed. Risks and benefits including need for potential replacement with recurrent disease were discussed and patient is agreeable to proceed.  Description of procedure: The patient was brought to the vascular and interventional radiology suite. Moderate conscious sedation was administered during a face to face encounter with the patient throughout the procedure with my supervision of the RN administering medicines and monitoring the patient's vital signs, pulse oximetry, telemetry and mental status throughout from the start of the procedure until  the patient was taken to the recovery room.  The right neck chest and shoulder were sterilely prepped and draped, and a sterile surgical field was created. The area was then anesthetized with 1% lidocaine copiously. The previous incision was reopened and electrocautery used to dissected down to the port and the catheter. These were dissected free and the catheter was gently removed from the vein in its entirety. The port was dissected out from the fibrous connective tissue and the Prolene sutures were removed. The port was then removed leaving the catheter in place to rewire to make sure we would have access to cross the subclavian vein.  The catheter was then removed over a Magic torque wire. I then turned my attention to the arm.  The right basilic vein was visualized with ultrasound.  It did appear to be patent in that location was accessed with a micropuncture needle without difficulty and a permanent image was recorded.  A micropuncture wire and sheath were then placed and upsized to 11 French sheath in the right basilic vein.  Imaging was then performed for the upper extremity through the 11 French sheath showing the basilic vein to be small and have a small amount of thrombus but the axillary vein was patent.  There was then thrombus within the subclavian vein and occlusion around the area that the catheter from the port was then.  Using a Kumpe catheter and a Magic torque wire I was able to navigate through this and cross the subclavian vein thrombus and occlusion and get into the superior vena cava where imaging was performed showing superior vena cava to be patent.  I then replaced a Magic torque wire and  parked this in the inferior vena cava.  The penumbra CAT 12 catheter was brought on the field and mechanical thrombectomy was performed in the right subclavian and innominate veins with chunks of chronic appearing thrombus removed.  While we did this, the wire from the port site was removed and the 7  French sheath that had been placed at the port site was removed.  I then used an 8 mm diameter by 8 cm length and then a 10 mm diameter by 4 cm length angioplasty balloon in the right subclavian and innominate veins with each inflation being 6 to 8 atm.  Completion imaging showed brisk flow with marked improvement and less than 30% residual stenosis and no residual thrombus seen.  The 11 French sheath was then removed with a pursestring Monocryl suture placed at the access site.  I turned my attention back to the port removal site. The wound was then closed with a 3-0 Vicryl and a 4-0 Monocryl and Dermabond was placed as a dressing. The patient was then taken to the recovery room in stable condition having tolerated the procedure well.  Complications: none  Condition: stable   [name], MD 02/28/2022 9:21 AM   This note was created with Dragon Medical transcription system. Any errors in dictation are purely unintentional.

## 2022-03-01 ENCOUNTER — Encounter: Payer: Self-pay | Admitting: Vascular Surgery

## 2022-03-01 ENCOUNTER — Ambulatory Visit: Payer: Medicare PPO

## 2022-03-04 ENCOUNTER — Encounter (INDEPENDENT_AMBULATORY_CARE_PROVIDER_SITE_OTHER): Payer: Self-pay

## 2022-03-04 ENCOUNTER — Ambulatory Visit
Admission: RE | Admit: 2022-03-04 | Discharge: 2022-03-04 | Disposition: A | Discharge status: Home / Self Care | Payer: Medicare PPO | Source: Ambulatory Visit | Attending: Radiation Oncology | Admitting: Radiation Oncology

## 2022-03-04 ENCOUNTER — Other Ambulatory Visit: Payer: Self-pay

## 2022-03-04 DIAGNOSIS — K219 Gastro-esophageal reflux disease without esophagitis: Secondary | ICD-10-CM | POA: Diagnosis not present

## 2022-03-04 DIAGNOSIS — Z791 Long term (current) use of non-steroidal anti-inflammatories (NSAID): Secondary | ICD-10-CM | POA: Diagnosis not present

## 2022-03-04 DIAGNOSIS — Z79899 Other long term (current) drug therapy: Secondary | ICD-10-CM | POA: Diagnosis not present

## 2022-03-04 DIAGNOSIS — C50412 Malignant neoplasm of upper-outer quadrant of left female breast: Secondary | ICD-10-CM | POA: Diagnosis not present

## 2022-03-04 DIAGNOSIS — Z86718 Personal history of other venous thrombosis and embolism: Secondary | ICD-10-CM | POA: Diagnosis not present

## 2022-03-04 DIAGNOSIS — Z9221 Personal history of antineoplastic chemotherapy: Secondary | ICD-10-CM | POA: Diagnosis not present

## 2022-03-04 DIAGNOSIS — Z170 Estrogen receptor positive status [ER+]: Secondary | ICD-10-CM | POA: Diagnosis not present

## 2022-03-04 DIAGNOSIS — Z7982 Long term (current) use of aspirin: Secondary | ICD-10-CM | POA: Diagnosis not present

## 2022-03-04 DIAGNOSIS — Z510 Encounter for antineoplastic radiation therapy: Secondary | ICD-10-CM | POA: Diagnosis not present

## 2022-03-04 DIAGNOSIS — E785 Hyperlipidemia, unspecified: Secondary | ICD-10-CM | POA: Diagnosis not present

## 2022-03-04 DIAGNOSIS — Z171 Estrogen receptor negative status [ER-]: Secondary | ICD-10-CM | POA: Diagnosis not present

## 2022-03-04 LAB — RAD ONC ARIA SESSION SUMMARY
Course Elapsed Days: 0
Plan Fractions Treated to Date: 1
Plan Prescribed Dose Per Fraction: 2.66 Gy
Plan Total Fractions Prescribed: 16
Plan Total Prescribed Dose: 42.56 Gy
Reference Point Dosage Given to Date: 2.66 Gy
Reference Point Session Dosage Given: 2.66 Gy
Session Number: 1

## 2022-03-05 ENCOUNTER — Ambulatory Visit
Admission: RE | Admit: 2022-03-05 | Discharge: 2022-03-05 | Disposition: A | Discharge status: Home / Self Care | Payer: Medicare PPO | Source: Ambulatory Visit | Attending: Radiation Oncology | Admitting: Radiation Oncology

## 2022-03-05 ENCOUNTER — Other Ambulatory Visit: Payer: Self-pay

## 2022-03-05 DIAGNOSIS — Z510 Encounter for antineoplastic radiation therapy: Secondary | ICD-10-CM | POA: Diagnosis not present

## 2022-03-05 DIAGNOSIS — K219 Gastro-esophageal reflux disease without esophagitis: Secondary | ICD-10-CM | POA: Diagnosis not present

## 2022-03-05 DIAGNOSIS — Z86718 Personal history of other venous thrombosis and embolism: Secondary | ICD-10-CM | POA: Diagnosis not present

## 2022-03-05 DIAGNOSIS — E785 Hyperlipidemia, unspecified: Secondary | ICD-10-CM | POA: Diagnosis not present

## 2022-03-05 DIAGNOSIS — Z79899 Other long term (current) drug therapy: Secondary | ICD-10-CM | POA: Diagnosis not present

## 2022-03-05 DIAGNOSIS — Z170 Estrogen receptor positive status [ER+]: Secondary | ICD-10-CM | POA: Diagnosis not present

## 2022-03-05 DIAGNOSIS — Z171 Estrogen receptor negative status [ER-]: Secondary | ICD-10-CM | POA: Diagnosis not present

## 2022-03-05 DIAGNOSIS — Z7982 Long term (current) use of aspirin: Secondary | ICD-10-CM | POA: Diagnosis not present

## 2022-03-05 DIAGNOSIS — Z9221 Personal history of antineoplastic chemotherapy: Secondary | ICD-10-CM | POA: Diagnosis not present

## 2022-03-05 DIAGNOSIS — C50412 Malignant neoplasm of upper-outer quadrant of left female breast: Secondary | ICD-10-CM | POA: Diagnosis not present

## 2022-03-05 DIAGNOSIS — Z791 Long term (current) use of non-steroidal anti-inflammatories (NSAID): Secondary | ICD-10-CM | POA: Diagnosis not present

## 2022-03-05 LAB — RAD ONC ARIA SESSION SUMMARY
Course Elapsed Days: 1
Plan Fractions Treated to Date: 2
Plan Prescribed Dose Per Fraction: 2.66 Gy
Plan Total Fractions Prescribed: 16
Plan Total Prescribed Dose: 42.56 Gy
Reference Point Dosage Given to Date: 5.32 Gy
Reference Point Session Dosage Given: 2.66 Gy
Session Number: 2

## 2022-03-06 ENCOUNTER — Other Ambulatory Visit: Payer: Self-pay

## 2022-03-06 ENCOUNTER — Ambulatory Visit
Admission: RE | Admit: 2022-03-06 | Discharge: 2022-03-06 | Disposition: A | Discharge status: Home / Self Care | Payer: Medicare PPO | Source: Ambulatory Visit | Attending: Radiation Oncology | Admitting: Radiation Oncology

## 2022-03-06 DIAGNOSIS — Z791 Long term (current) use of non-steroidal anti-inflammatories (NSAID): Secondary | ICD-10-CM | POA: Insufficient documentation

## 2022-03-06 DIAGNOSIS — Z86718 Personal history of other venous thrombosis and embolism: Secondary | ICD-10-CM | POA: Diagnosis not present

## 2022-03-06 DIAGNOSIS — Z79899 Other long term (current) drug therapy: Secondary | ICD-10-CM | POA: Insufficient documentation

## 2022-03-06 DIAGNOSIS — Z171 Estrogen receptor negative status [ER-]: Secondary | ICD-10-CM | POA: Insufficient documentation

## 2022-03-06 DIAGNOSIS — E785 Hyperlipidemia, unspecified: Secondary | ICD-10-CM | POA: Diagnosis not present

## 2022-03-06 DIAGNOSIS — C50412 Malignant neoplasm of upper-outer quadrant of left female breast: Secondary | ICD-10-CM | POA: Diagnosis not present

## 2022-03-06 DIAGNOSIS — Z7982 Long term (current) use of aspirin: Secondary | ICD-10-CM | POA: Diagnosis not present

## 2022-03-06 DIAGNOSIS — Z510 Encounter for antineoplastic radiation therapy: Secondary | ICD-10-CM | POA: Diagnosis not present

## 2022-03-06 DIAGNOSIS — Z9221 Personal history of antineoplastic chemotherapy: Secondary | ICD-10-CM | POA: Insufficient documentation

## 2022-03-06 DIAGNOSIS — Z801 Family history of malignant neoplasm of trachea, bronchus and lung: Secondary | ICD-10-CM | POA: Insufficient documentation

## 2022-03-06 DIAGNOSIS — Z7901 Long term (current) use of anticoagulants: Secondary | ICD-10-CM | POA: Diagnosis not present

## 2022-03-06 DIAGNOSIS — Z170 Estrogen receptor positive status [ER+]: Secondary | ICD-10-CM | POA: Diagnosis not present

## 2022-03-06 DIAGNOSIS — K219 Gastro-esophageal reflux disease without esophagitis: Secondary | ICD-10-CM | POA: Insufficient documentation

## 2022-03-06 DIAGNOSIS — Z800 Family history of malignant neoplasm of digestive organs: Secondary | ICD-10-CM | POA: Diagnosis not present

## 2022-03-06 LAB — RAD ONC ARIA SESSION SUMMARY
Course Elapsed Days: 2
Plan Fractions Treated to Date: 3
Plan Prescribed Dose Per Fraction: 2.66 Gy
Plan Total Fractions Prescribed: 16
Plan Total Prescribed Dose: 42.56 Gy
Reference Point Dosage Given to Date: 7.98 Gy
Reference Point Session Dosage Given: 2.66 Gy
Session Number: 3

## 2022-03-06 NOTE — Interval H&P Note (Signed)
History and Physical Interval Note:  03/06/2022 8:59 AM  Megan Harrison  has presented today for surgery, with the diagnosis of Porta Cath Removal   DVT.  The various methods of treatment have been discussed with the patient and family. After consideration of risks, benefits and other options for treatment, the patient has consented to  Procedure(s): PORTA CATH REMOVAL (N/A) as a surgical intervention.  The patient's history has been reviewed, patient examined, no change in status, stable for surgery.  I have reviewed the patient's chart and labs.  Questions were answered to the patient's satisfaction.     [name]

## 2022-03-07 ENCOUNTER — Ambulatory Visit
Admission: RE | Admit: 2022-03-07 | Discharge: 2022-03-07 | Disposition: A | Discharge status: Home / Self Care | Payer: Medicare PPO | Source: Ambulatory Visit | Attending: Radiation Oncology | Admitting: Radiation Oncology

## 2022-03-07 ENCOUNTER — Other Ambulatory Visit: Payer: Self-pay

## 2022-03-07 DIAGNOSIS — Z171 Estrogen receptor negative status [ER-]: Secondary | ICD-10-CM | POA: Diagnosis not present

## 2022-03-07 DIAGNOSIS — Z79899 Other long term (current) drug therapy: Secondary | ICD-10-CM | POA: Diagnosis not present

## 2022-03-07 DIAGNOSIS — Z170 Estrogen receptor positive status [ER+]: Secondary | ICD-10-CM | POA: Diagnosis not present

## 2022-03-07 DIAGNOSIS — Z86718 Personal history of other venous thrombosis and embolism: Secondary | ICD-10-CM | POA: Diagnosis not present

## 2022-03-07 DIAGNOSIS — C50412 Malignant neoplasm of upper-outer quadrant of left female breast: Secondary | ICD-10-CM | POA: Diagnosis not present

## 2022-03-07 DIAGNOSIS — K219 Gastro-esophageal reflux disease without esophagitis: Secondary | ICD-10-CM | POA: Diagnosis not present

## 2022-03-07 DIAGNOSIS — Z7982 Long term (current) use of aspirin: Secondary | ICD-10-CM | POA: Diagnosis not present

## 2022-03-07 DIAGNOSIS — Z510 Encounter for antineoplastic radiation therapy: Secondary | ICD-10-CM | POA: Diagnosis not present

## 2022-03-07 DIAGNOSIS — Z9221 Personal history of antineoplastic chemotherapy: Secondary | ICD-10-CM | POA: Diagnosis not present

## 2022-03-07 DIAGNOSIS — Z791 Long term (current) use of non-steroidal anti-inflammatories (NSAID): Secondary | ICD-10-CM | POA: Diagnosis not present

## 2022-03-07 DIAGNOSIS — E785 Hyperlipidemia, unspecified: Secondary | ICD-10-CM | POA: Diagnosis not present

## 2022-03-07 LAB — RAD ONC ARIA SESSION SUMMARY
Course Elapsed Days: 3
Plan Fractions Treated to Date: 4
Plan Prescribed Dose Per Fraction: 2.66 Gy
Plan Total Fractions Prescribed: 16
Plan Total Prescribed Dose: 42.56 Gy
Reference Point Dosage Given to Date: 10.64 Gy
Reference Point Session Dosage Given: 2.66 Gy
Session Number: 4

## 2022-03-08 ENCOUNTER — Other Ambulatory Visit: Payer: Self-pay

## 2022-03-08 ENCOUNTER — Ambulatory Visit
Admission: RE | Admit: 2022-03-08 | Discharge: 2022-03-08 | Disposition: A | Discharge status: Home / Self Care | Payer: Medicare PPO | Source: Ambulatory Visit | Attending: Radiation Oncology | Admitting: Radiation Oncology

## 2022-03-08 DIAGNOSIS — Z7982 Long term (current) use of aspirin: Secondary | ICD-10-CM | POA: Diagnosis not present

## 2022-03-08 DIAGNOSIS — Z86718 Personal history of other venous thrombosis and embolism: Secondary | ICD-10-CM | POA: Diagnosis not present

## 2022-03-08 DIAGNOSIS — C50412 Malignant neoplasm of upper-outer quadrant of left female breast: Secondary | ICD-10-CM | POA: Diagnosis not present

## 2022-03-08 DIAGNOSIS — Z9221 Personal history of antineoplastic chemotherapy: Secondary | ICD-10-CM | POA: Diagnosis not present

## 2022-03-08 DIAGNOSIS — Z170 Estrogen receptor positive status [ER+]: Secondary | ICD-10-CM | POA: Diagnosis not present

## 2022-03-08 DIAGNOSIS — Z171 Estrogen receptor negative status [ER-]: Secondary | ICD-10-CM | POA: Diagnosis not present

## 2022-03-08 DIAGNOSIS — E785 Hyperlipidemia, unspecified: Secondary | ICD-10-CM | POA: Diagnosis not present

## 2022-03-08 DIAGNOSIS — Z79899 Other long term (current) drug therapy: Secondary | ICD-10-CM | POA: Diagnosis not present

## 2022-03-08 DIAGNOSIS — Z791 Long term (current) use of non-steroidal anti-inflammatories (NSAID): Secondary | ICD-10-CM | POA: Diagnosis not present

## 2022-03-08 DIAGNOSIS — K219 Gastro-esophageal reflux disease without esophagitis: Secondary | ICD-10-CM | POA: Diagnosis not present

## 2022-03-08 DIAGNOSIS — Z510 Encounter for antineoplastic radiation therapy: Secondary | ICD-10-CM | POA: Diagnosis not present

## 2022-03-08 LAB — RAD ONC ARIA SESSION SUMMARY
Course Elapsed Days: 4
Plan Fractions Treated to Date: 5
Plan Prescribed Dose Per Fraction: 2.66 Gy
Plan Total Fractions Prescribed: 16
Plan Total Prescribed Dose: 42.56 Gy
Reference Point Dosage Given to Date: 13.3 Gy
Reference Point Session Dosage Given: 2.66 Gy
Session Number: 5

## 2022-03-11 ENCOUNTER — Other Ambulatory Visit: Payer: Self-pay

## 2022-03-11 ENCOUNTER — Ambulatory Visit
Admission: RE | Admit: 2022-03-11 | Discharge: 2022-03-11 | Disposition: A | Discharge status: Home / Self Care | Payer: Medicare PPO | Source: Ambulatory Visit | Attending: Radiation Oncology | Admitting: Radiation Oncology

## 2022-03-11 DIAGNOSIS — Z86718 Personal history of other venous thrombosis and embolism: Secondary | ICD-10-CM | POA: Diagnosis not present

## 2022-03-11 DIAGNOSIS — C50412 Malignant neoplasm of upper-outer quadrant of left female breast: Secondary | ICD-10-CM | POA: Diagnosis not present

## 2022-03-11 DIAGNOSIS — Z79899 Other long term (current) drug therapy: Secondary | ICD-10-CM | POA: Diagnosis not present

## 2022-03-11 DIAGNOSIS — E785 Hyperlipidemia, unspecified: Secondary | ICD-10-CM | POA: Diagnosis not present

## 2022-03-11 DIAGNOSIS — Z7982 Long term (current) use of aspirin: Secondary | ICD-10-CM | POA: Diagnosis not present

## 2022-03-11 DIAGNOSIS — K219 Gastro-esophageal reflux disease without esophagitis: Secondary | ICD-10-CM | POA: Diagnosis not present

## 2022-03-11 DIAGNOSIS — Z9221 Personal history of antineoplastic chemotherapy: Secondary | ICD-10-CM | POA: Diagnosis not present

## 2022-03-11 DIAGNOSIS — Z791 Long term (current) use of non-steroidal anti-inflammatories (NSAID): Secondary | ICD-10-CM | POA: Diagnosis not present

## 2022-03-11 DIAGNOSIS — Z171 Estrogen receptor negative status [ER-]: Secondary | ICD-10-CM | POA: Diagnosis not present

## 2022-03-11 DIAGNOSIS — Z510 Encounter for antineoplastic radiation therapy: Secondary | ICD-10-CM | POA: Diagnosis not present

## 2022-03-11 DIAGNOSIS — Z170 Estrogen receptor positive status [ER+]: Secondary | ICD-10-CM | POA: Diagnosis not present

## 2022-03-11 LAB — RAD ONC ARIA SESSION SUMMARY
Course Elapsed Days: 7
Plan Fractions Treated to Date: 6
Plan Prescribed Dose Per Fraction: 2.66 Gy
Plan Total Fractions Prescribed: 16
Plan Total Prescribed Dose: 42.56 Gy
Reference Point Dosage Given to Date: 15.96 Gy
Reference Point Session Dosage Given: 2.66 Gy
Session Number: 6

## 2022-03-12 ENCOUNTER — Ambulatory Visit
Admission: RE | Admit: 2022-03-12 | Discharge: 2022-03-12 | Disposition: A | Discharge status: Home / Self Care | Payer: Medicare PPO | Source: Ambulatory Visit | Attending: Radiation Oncology | Admitting: Radiation Oncology

## 2022-03-12 ENCOUNTER — Other Ambulatory Visit: Payer: Self-pay

## 2022-03-12 ENCOUNTER — Encounter: Payer: Self-pay | Admitting: Vascular Surgery

## 2022-03-12 DIAGNOSIS — K219 Gastro-esophageal reflux disease without esophagitis: Secondary | ICD-10-CM | POA: Diagnosis not present

## 2022-03-12 DIAGNOSIS — Z7982 Long term (current) use of aspirin: Secondary | ICD-10-CM | POA: Diagnosis not present

## 2022-03-12 DIAGNOSIS — E785 Hyperlipidemia, unspecified: Secondary | ICD-10-CM | POA: Diagnosis not present

## 2022-03-12 DIAGNOSIS — Z86718 Personal history of other venous thrombosis and embolism: Secondary | ICD-10-CM | POA: Diagnosis not present

## 2022-03-12 DIAGNOSIS — Z79899 Other long term (current) drug therapy: Secondary | ICD-10-CM | POA: Diagnosis not present

## 2022-03-12 DIAGNOSIS — Z9221 Personal history of antineoplastic chemotherapy: Secondary | ICD-10-CM | POA: Diagnosis not present

## 2022-03-12 DIAGNOSIS — C50412 Malignant neoplasm of upper-outer quadrant of left female breast: Secondary | ICD-10-CM | POA: Diagnosis not present

## 2022-03-12 DIAGNOSIS — Z791 Long term (current) use of non-steroidal anti-inflammatories (NSAID): Secondary | ICD-10-CM | POA: Diagnosis not present

## 2022-03-12 DIAGNOSIS — Z170 Estrogen receptor positive status [ER+]: Secondary | ICD-10-CM | POA: Diagnosis not present

## 2022-03-12 DIAGNOSIS — Z171 Estrogen receptor negative status [ER-]: Secondary | ICD-10-CM | POA: Diagnosis not present

## 2022-03-12 DIAGNOSIS — Z510 Encounter for antineoplastic radiation therapy: Secondary | ICD-10-CM | POA: Diagnosis not present

## 2022-03-12 LAB — RAD ONC ARIA SESSION SUMMARY
Course Elapsed Days: 8
Plan Fractions Treated to Date: 7
Plan Prescribed Dose Per Fraction: 2.66 Gy
Plan Total Fractions Prescribed: 16
Plan Total Prescribed Dose: 42.56 Gy
Reference Point Dosage Given to Date: 18.62 Gy
Reference Point Session Dosage Given: 2.66 Gy
Session Number: 7

## 2022-03-13 ENCOUNTER — Inpatient Hospital Stay: Payer: Medicare PPO

## 2022-03-13 ENCOUNTER — Ambulatory Visit
Admission: RE | Admit: 2022-03-13 | Discharge: 2022-03-13 | Disposition: A | Discharge status: Home / Self Care | Payer: Medicare PPO | Source: Ambulatory Visit | Attending: Radiation Oncology | Admitting: Radiation Oncology

## 2022-03-13 ENCOUNTER — Other Ambulatory Visit: Payer: Self-pay

## 2022-03-13 DIAGNOSIS — Z791 Long term (current) use of non-steroidal anti-inflammatories (NSAID): Secondary | ICD-10-CM | POA: Diagnosis not present

## 2022-03-13 DIAGNOSIS — Z171 Estrogen receptor negative status [ER-]: Secondary | ICD-10-CM | POA: Diagnosis not present

## 2022-03-13 DIAGNOSIS — Z170 Estrogen receptor positive status [ER+]: Secondary | ICD-10-CM | POA: Diagnosis not present

## 2022-03-13 DIAGNOSIS — Z86718 Personal history of other venous thrombosis and embolism: Secondary | ICD-10-CM | POA: Diagnosis not present

## 2022-03-13 DIAGNOSIS — E785 Hyperlipidemia, unspecified: Secondary | ICD-10-CM | POA: Diagnosis not present

## 2022-03-13 DIAGNOSIS — C50412 Malignant neoplasm of upper-outer quadrant of left female breast: Secondary | ICD-10-CM | POA: Insufficient documentation

## 2022-03-13 DIAGNOSIS — I82621 Acute embolism and thrombosis of deep veins of right upper extremity: Secondary | ICD-10-CM | POA: Insufficient documentation

## 2022-03-13 DIAGNOSIS — Z79899 Other long term (current) drug therapy: Secondary | ICD-10-CM | POA: Diagnosis not present

## 2022-03-13 DIAGNOSIS — Z7982 Long term (current) use of aspirin: Secondary | ICD-10-CM | POA: Diagnosis not present

## 2022-03-13 DIAGNOSIS — Z7901 Long term (current) use of anticoagulants: Secondary | ICD-10-CM | POA: Insufficient documentation

## 2022-03-13 DIAGNOSIS — D6481 Anemia due to antineoplastic chemotherapy: Secondary | ICD-10-CM | POA: Insufficient documentation

## 2022-03-13 DIAGNOSIS — G620 Drug-induced polyneuropathy: Secondary | ICD-10-CM | POA: Insufficient documentation

## 2022-03-13 DIAGNOSIS — Z9221 Personal history of antineoplastic chemotherapy: Secondary | ICD-10-CM | POA: Diagnosis not present

## 2022-03-13 DIAGNOSIS — T451X5A Adverse effect of antineoplastic and immunosuppressive drugs, initial encounter: Secondary | ICD-10-CM | POA: Insufficient documentation

## 2022-03-13 DIAGNOSIS — Z510 Encounter for antineoplastic radiation therapy: Secondary | ICD-10-CM | POA: Diagnosis not present

## 2022-03-13 DIAGNOSIS — K219 Gastro-esophageal reflux disease without esophagitis: Secondary | ICD-10-CM | POA: Diagnosis not present

## 2022-03-13 LAB — RAD ONC ARIA SESSION SUMMARY
Course Elapsed Days: 9
Plan Fractions Treated to Date: 8
Plan Prescribed Dose Per Fraction: 2.66 Gy
Plan Total Fractions Prescribed: 16
Plan Total Prescribed Dose: 42.56 Gy
Reference Point Dosage Given to Date: 21.28 Gy
Reference Point Session Dosage Given: 2.66 Gy
Session Number: 8

## 2022-03-13 LAB — CBC
HCT: 31.3 % — ABNORMAL LOW (ref 36.0–46.0)
Hemoglobin: 9.9 g/dL — ABNORMAL LOW (ref 12.0–15.0)
MCH: 32.5 pg (ref 26.0–34.0)
MCHC: 31.6 g/dL (ref 30.0–36.0)
MCV: 102.6 fL — ABNORMAL HIGH (ref 80.0–100.0)
Platelets: 435 10*3/uL — ABNORMAL HIGH (ref 150–400)
RBC: 3.05 MIL/uL — ABNORMAL LOW (ref 3.87–5.11)
RDW: 16.7 % — ABNORMAL HIGH (ref 11.5–15.5)
WBC: 6.5 10*3/uL (ref 4.0–10.5)
nRBC: 0 % (ref 0.0–0.2)

## 2022-03-14 ENCOUNTER — Encounter (INDEPENDENT_AMBULATORY_CARE_PROVIDER_SITE_OTHER): Payer: Self-pay | Admitting: Vascular Surgery

## 2022-03-14 ENCOUNTER — Other Ambulatory Visit: Payer: Self-pay

## 2022-03-14 ENCOUNTER — Ambulatory Visit
Admission: RE | Admit: 2022-03-14 | Discharge: 2022-03-14 | Disposition: A | Discharge status: Home / Self Care | Payer: Medicare PPO | Source: Ambulatory Visit | Attending: Radiation Oncology | Admitting: Radiation Oncology

## 2022-03-14 DIAGNOSIS — Z7982 Long term (current) use of aspirin: Secondary | ICD-10-CM | POA: Diagnosis not present

## 2022-03-14 DIAGNOSIS — Z170 Estrogen receptor positive status [ER+]: Secondary | ICD-10-CM | POA: Diagnosis not present

## 2022-03-14 DIAGNOSIS — C50412 Malignant neoplasm of upper-outer quadrant of left female breast: Secondary | ICD-10-CM | POA: Diagnosis not present

## 2022-03-14 DIAGNOSIS — Z9221 Personal history of antineoplastic chemotherapy: Secondary | ICD-10-CM | POA: Diagnosis not present

## 2022-03-14 DIAGNOSIS — Z791 Long term (current) use of non-steroidal anti-inflammatories (NSAID): Secondary | ICD-10-CM | POA: Diagnosis not present

## 2022-03-14 DIAGNOSIS — E785 Hyperlipidemia, unspecified: Secondary | ICD-10-CM | POA: Diagnosis not present

## 2022-03-14 DIAGNOSIS — Z171 Estrogen receptor negative status [ER-]: Secondary | ICD-10-CM | POA: Diagnosis not present

## 2022-03-14 DIAGNOSIS — Z86718 Personal history of other venous thrombosis and embolism: Secondary | ICD-10-CM | POA: Diagnosis not present

## 2022-03-14 DIAGNOSIS — K219 Gastro-esophageal reflux disease without esophagitis: Secondary | ICD-10-CM | POA: Diagnosis not present

## 2022-03-14 DIAGNOSIS — Z510 Encounter for antineoplastic radiation therapy: Secondary | ICD-10-CM | POA: Diagnosis not present

## 2022-03-14 DIAGNOSIS — Z79899 Other long term (current) drug therapy: Secondary | ICD-10-CM | POA: Diagnosis not present

## 2022-03-14 LAB — RAD ONC ARIA SESSION SUMMARY
Course Elapsed Days: 10
Plan Fractions Treated to Date: 9
Plan Prescribed Dose Per Fraction: 2.66 Gy
Plan Total Fractions Prescribed: 16
Plan Total Prescribed Dose: 42.56 Gy
Reference Point Dosage Given to Date: 23.94 Gy
Reference Point Session Dosage Given: 2.66 Gy
Session Number: 9

## 2022-03-15 ENCOUNTER — Other Ambulatory Visit: Payer: Self-pay

## 2022-03-15 ENCOUNTER — Ambulatory Visit
Admission: RE | Admit: 2022-03-15 | Discharge: 2022-03-15 | Disposition: A | Discharge status: Home / Self Care | Payer: Medicare PPO | Source: Ambulatory Visit | Attending: Radiation Oncology | Admitting: Radiation Oncology

## 2022-03-15 DIAGNOSIS — E785 Hyperlipidemia, unspecified: Secondary | ICD-10-CM | POA: Diagnosis not present

## 2022-03-15 DIAGNOSIS — Z170 Estrogen receptor positive status [ER+]: Secondary | ICD-10-CM | POA: Diagnosis not present

## 2022-03-15 DIAGNOSIS — C50412 Malignant neoplasm of upper-outer quadrant of left female breast: Secondary | ICD-10-CM | POA: Diagnosis not present

## 2022-03-15 DIAGNOSIS — Z79899 Other long term (current) drug therapy: Secondary | ICD-10-CM | POA: Diagnosis not present

## 2022-03-15 DIAGNOSIS — Z86718 Personal history of other venous thrombosis and embolism: Secondary | ICD-10-CM | POA: Diagnosis not present

## 2022-03-15 DIAGNOSIS — Z171 Estrogen receptor negative status [ER-]: Secondary | ICD-10-CM | POA: Diagnosis not present

## 2022-03-15 DIAGNOSIS — Z791 Long term (current) use of non-steroidal anti-inflammatories (NSAID): Secondary | ICD-10-CM | POA: Diagnosis not present

## 2022-03-15 DIAGNOSIS — Z9221 Personal history of antineoplastic chemotherapy: Secondary | ICD-10-CM | POA: Diagnosis not present

## 2022-03-15 DIAGNOSIS — Z510 Encounter for antineoplastic radiation therapy: Secondary | ICD-10-CM | POA: Diagnosis not present

## 2022-03-15 DIAGNOSIS — Z7982 Long term (current) use of aspirin: Secondary | ICD-10-CM | POA: Diagnosis not present

## 2022-03-15 DIAGNOSIS — K219 Gastro-esophageal reflux disease without esophagitis: Secondary | ICD-10-CM | POA: Diagnosis not present

## 2022-03-15 LAB — RAD ONC ARIA SESSION SUMMARY
Course Elapsed Days: 11
Plan Fractions Treated to Date: 10
Plan Prescribed Dose Per Fraction: 2.66 Gy
Plan Total Fractions Prescribed: 16
Plan Total Prescribed Dose: 42.56 Gy
Reference Point Dosage Given to Date: 26.6 Gy
Reference Point Session Dosage Given: 2.66 Gy
Session Number: 10

## 2022-03-18 ENCOUNTER — Other Ambulatory Visit: Payer: Self-pay

## 2022-03-18 ENCOUNTER — Ambulatory Visit
Admission: RE | Admit: 2022-03-18 | Discharge: 2022-03-18 | Disposition: A | Discharge status: Home / Self Care | Payer: Medicare PPO | Source: Ambulatory Visit | Attending: Radiation Oncology | Admitting: Radiation Oncology

## 2022-03-18 DIAGNOSIS — Z7982 Long term (current) use of aspirin: Secondary | ICD-10-CM | POA: Diagnosis not present

## 2022-03-18 DIAGNOSIS — Z510 Encounter for antineoplastic radiation therapy: Secondary | ICD-10-CM | POA: Diagnosis not present

## 2022-03-18 DIAGNOSIS — E785 Hyperlipidemia, unspecified: Secondary | ICD-10-CM | POA: Diagnosis not present

## 2022-03-18 DIAGNOSIS — Z79899 Other long term (current) drug therapy: Secondary | ICD-10-CM | POA: Diagnosis not present

## 2022-03-18 DIAGNOSIS — K219 Gastro-esophageal reflux disease without esophagitis: Secondary | ICD-10-CM | POA: Diagnosis not present

## 2022-03-18 DIAGNOSIS — Z791 Long term (current) use of non-steroidal anti-inflammatories (NSAID): Secondary | ICD-10-CM | POA: Diagnosis not present

## 2022-03-18 DIAGNOSIS — Z86718 Personal history of other venous thrombosis and embolism: Secondary | ICD-10-CM | POA: Diagnosis not present

## 2022-03-18 DIAGNOSIS — Z170 Estrogen receptor positive status [ER+]: Secondary | ICD-10-CM | POA: Diagnosis not present

## 2022-03-18 DIAGNOSIS — Z9221 Personal history of antineoplastic chemotherapy: Secondary | ICD-10-CM | POA: Diagnosis not present

## 2022-03-18 DIAGNOSIS — C50412 Malignant neoplasm of upper-outer quadrant of left female breast: Secondary | ICD-10-CM | POA: Diagnosis not present

## 2022-03-18 DIAGNOSIS — Z171 Estrogen receptor negative status [ER-]: Secondary | ICD-10-CM | POA: Diagnosis not present

## 2022-03-18 LAB — RAD ONC ARIA SESSION SUMMARY
Course Elapsed Days: 14
Plan Fractions Treated to Date: 11
Plan Prescribed Dose Per Fraction: 2.66 Gy
Plan Total Fractions Prescribed: 16
Plan Total Prescribed Dose: 42.56 Gy
Reference Point Dosage Given to Date: 29.26 Gy
Reference Point Session Dosage Given: 2.66 Gy
Session Number: 11

## 2022-03-19 ENCOUNTER — Ambulatory Visit
Admission: RE | Admit: 2022-03-19 | Discharge: 2022-03-19 | Disposition: A | Discharge status: Home / Self Care | Payer: Medicare PPO | Source: Ambulatory Visit | Attending: Radiation Oncology | Admitting: Radiation Oncology

## 2022-03-19 ENCOUNTER — Other Ambulatory Visit: Payer: Self-pay

## 2022-03-19 DIAGNOSIS — Z7982 Long term (current) use of aspirin: Secondary | ICD-10-CM | POA: Diagnosis not present

## 2022-03-19 DIAGNOSIS — Z791 Long term (current) use of non-steroidal anti-inflammatories (NSAID): Secondary | ICD-10-CM | POA: Diagnosis not present

## 2022-03-19 DIAGNOSIS — Z86718 Personal history of other venous thrombosis and embolism: Secondary | ICD-10-CM | POA: Diagnosis not present

## 2022-03-19 DIAGNOSIS — Z170 Estrogen receptor positive status [ER+]: Secondary | ICD-10-CM | POA: Diagnosis not present

## 2022-03-19 DIAGNOSIS — Z9221 Personal history of antineoplastic chemotherapy: Secondary | ICD-10-CM | POA: Diagnosis not present

## 2022-03-19 DIAGNOSIS — Z510 Encounter for antineoplastic radiation therapy: Secondary | ICD-10-CM | POA: Diagnosis not present

## 2022-03-19 DIAGNOSIS — C50412 Malignant neoplasm of upper-outer quadrant of left female breast: Secondary | ICD-10-CM | POA: Diagnosis not present

## 2022-03-19 DIAGNOSIS — K219 Gastro-esophageal reflux disease without esophagitis: Secondary | ICD-10-CM | POA: Diagnosis not present

## 2022-03-19 DIAGNOSIS — Z171 Estrogen receptor negative status [ER-]: Secondary | ICD-10-CM | POA: Diagnosis not present

## 2022-03-19 DIAGNOSIS — Z79899 Other long term (current) drug therapy: Secondary | ICD-10-CM | POA: Diagnosis not present

## 2022-03-19 DIAGNOSIS — E785 Hyperlipidemia, unspecified: Secondary | ICD-10-CM | POA: Diagnosis not present

## 2022-03-19 LAB — RAD ONC ARIA SESSION SUMMARY
Course Elapsed Days: 15
Plan Fractions Treated to Date: 12
Plan Prescribed Dose Per Fraction: 2.66 Gy
Plan Total Fractions Prescribed: 16
Plan Total Prescribed Dose: 42.56 Gy
Reference Point Dosage Given to Date: 31.92 Gy
Reference Point Session Dosage Given: 2.66 Gy
Session Number: 12

## 2022-03-20 ENCOUNTER — Ambulatory Visit
Admission: RE | Admit: 2022-03-20 | Discharge: 2022-03-20 | Disposition: A | Discharge status: Home / Self Care | Payer: Medicare PPO | Source: Ambulatory Visit | Attending: Radiation Oncology | Admitting: Radiation Oncology

## 2022-03-20 ENCOUNTER — Other Ambulatory Visit: Payer: Self-pay

## 2022-03-20 DIAGNOSIS — Z9221 Personal history of antineoplastic chemotherapy: Secondary | ICD-10-CM | POA: Diagnosis not present

## 2022-03-20 DIAGNOSIS — Z86718 Personal history of other venous thrombosis and embolism: Secondary | ICD-10-CM | POA: Diagnosis not present

## 2022-03-20 DIAGNOSIS — Z7982 Long term (current) use of aspirin: Secondary | ICD-10-CM | POA: Diagnosis not present

## 2022-03-20 DIAGNOSIS — C50412 Malignant neoplasm of upper-outer quadrant of left female breast: Secondary | ICD-10-CM | POA: Diagnosis not present

## 2022-03-20 DIAGNOSIS — Z170 Estrogen receptor positive status [ER+]: Secondary | ICD-10-CM | POA: Diagnosis not present

## 2022-03-20 DIAGNOSIS — E785 Hyperlipidemia, unspecified: Secondary | ICD-10-CM | POA: Diagnosis not present

## 2022-03-20 DIAGNOSIS — K219 Gastro-esophageal reflux disease without esophagitis: Secondary | ICD-10-CM | POA: Diagnosis not present

## 2022-03-20 DIAGNOSIS — Z79899 Other long term (current) drug therapy: Secondary | ICD-10-CM | POA: Diagnosis not present

## 2022-03-20 DIAGNOSIS — Z171 Estrogen receptor negative status [ER-]: Secondary | ICD-10-CM | POA: Diagnosis not present

## 2022-03-20 DIAGNOSIS — Z791 Long term (current) use of non-steroidal anti-inflammatories (NSAID): Secondary | ICD-10-CM | POA: Diagnosis not present

## 2022-03-20 DIAGNOSIS — Z510 Encounter for antineoplastic radiation therapy: Secondary | ICD-10-CM | POA: Diagnosis not present

## 2022-03-20 LAB — RAD ONC ARIA SESSION SUMMARY
Course Elapsed Days: 16
Plan Fractions Treated to Date: 13
Plan Prescribed Dose Per Fraction: 2.66 Gy
Plan Total Fractions Prescribed: 16
Plan Total Prescribed Dose: 42.56 Gy
Reference Point Dosage Given to Date: 34.58 Gy
Reference Point Session Dosage Given: 2.66 Gy
Session Number: 13

## 2022-03-21 ENCOUNTER — Encounter (INDEPENDENT_AMBULATORY_CARE_PROVIDER_SITE_OTHER): Payer: Self-pay | Admitting: Nurse Practitioner

## 2022-03-21 ENCOUNTER — Ambulatory Visit
Admission: RE | Admit: 2022-03-21 | Discharge: 2022-03-21 | Disposition: A | Discharge status: Home / Self Care | Payer: Medicare PPO | Source: Ambulatory Visit | Attending: Radiation Oncology | Admitting: Radiation Oncology

## 2022-03-21 ENCOUNTER — Other Ambulatory Visit: Payer: Self-pay

## 2022-03-21 ENCOUNTER — Ambulatory Visit (INDEPENDENT_AMBULATORY_CARE_PROVIDER_SITE_OTHER): Payer: Medicare PPO | Admitting: Nurse Practitioner

## 2022-03-21 VITALS — BP 147/81 | HR 69 | Resp 16 | Wt 189.0 lb

## 2022-03-21 DIAGNOSIS — Z79899 Other long term (current) drug therapy: Secondary | ICD-10-CM | POA: Diagnosis not present

## 2022-03-21 DIAGNOSIS — I82A11 Acute embolism and thrombosis of right axillary vein: Secondary | ICD-10-CM

## 2022-03-21 DIAGNOSIS — E785 Hyperlipidemia, unspecified: Secondary | ICD-10-CM | POA: Diagnosis not present

## 2022-03-21 DIAGNOSIS — Z791 Long term (current) use of non-steroidal anti-inflammatories (NSAID): Secondary | ICD-10-CM | POA: Diagnosis not present

## 2022-03-21 DIAGNOSIS — C50412 Malignant neoplasm of upper-outer quadrant of left female breast: Secondary | ICD-10-CM | POA: Diagnosis not present

## 2022-03-21 DIAGNOSIS — Z7982 Long term (current) use of aspirin: Secondary | ICD-10-CM | POA: Diagnosis not present

## 2022-03-21 DIAGNOSIS — Z170 Estrogen receptor positive status [ER+]: Secondary | ICD-10-CM | POA: Diagnosis not present

## 2022-03-21 DIAGNOSIS — Z86718 Personal history of other venous thrombosis and embolism: Secondary | ICD-10-CM | POA: Diagnosis not present

## 2022-03-21 DIAGNOSIS — Z171 Estrogen receptor negative status [ER-]: Secondary | ICD-10-CM | POA: Diagnosis not present

## 2022-03-21 DIAGNOSIS — Z510 Encounter for antineoplastic radiation therapy: Secondary | ICD-10-CM | POA: Diagnosis not present

## 2022-03-21 DIAGNOSIS — Z9221 Personal history of antineoplastic chemotherapy: Secondary | ICD-10-CM | POA: Diagnosis not present

## 2022-03-21 DIAGNOSIS — K219 Gastro-esophageal reflux disease without esophagitis: Secondary | ICD-10-CM | POA: Diagnosis not present

## 2022-03-21 LAB — RAD ONC ARIA SESSION SUMMARY
Course Elapsed Days: 17
Plan Fractions Treated to Date: 14
Plan Prescribed Dose Per Fraction: 2.66 Gy
Plan Total Fractions Prescribed: 16
Plan Total Prescribed Dose: 42.56 Gy
Reference Point Dosage Given to Date: 37.24 Gy
Reference Point Session Dosage Given: 2.66 Gy
Session Number: 14

## 2022-03-22 ENCOUNTER — Ambulatory Visit
Admission: RE | Admit: 2022-03-22 | Discharge: 2022-03-22 | Disposition: A | Discharge status: Home / Self Care | Payer: Medicare PPO | Source: Ambulatory Visit | Attending: Radiation Oncology | Admitting: Radiation Oncology

## 2022-03-22 ENCOUNTER — Other Ambulatory Visit: Payer: Self-pay

## 2022-03-22 ENCOUNTER — Ambulatory Visit: Payer: Medicare PPO

## 2022-03-22 DIAGNOSIS — Z79899 Other long term (current) drug therapy: Secondary | ICD-10-CM | POA: Diagnosis not present

## 2022-03-22 DIAGNOSIS — Z171 Estrogen receptor negative status [ER-]: Secondary | ICD-10-CM | POA: Diagnosis not present

## 2022-03-22 DIAGNOSIS — Z7982 Long term (current) use of aspirin: Secondary | ICD-10-CM | POA: Diagnosis not present

## 2022-03-22 DIAGNOSIS — E785 Hyperlipidemia, unspecified: Secondary | ICD-10-CM | POA: Diagnosis not present

## 2022-03-22 DIAGNOSIS — Z170 Estrogen receptor positive status [ER+]: Secondary | ICD-10-CM | POA: Diagnosis not present

## 2022-03-22 DIAGNOSIS — Z791 Long term (current) use of non-steroidal anti-inflammatories (NSAID): Secondary | ICD-10-CM | POA: Diagnosis not present

## 2022-03-22 DIAGNOSIS — Z510 Encounter for antineoplastic radiation therapy: Secondary | ICD-10-CM | POA: Diagnosis not present

## 2022-03-22 DIAGNOSIS — K219 Gastro-esophageal reflux disease without esophagitis: Secondary | ICD-10-CM | POA: Diagnosis not present

## 2022-03-22 DIAGNOSIS — Z9221 Personal history of antineoplastic chemotherapy: Secondary | ICD-10-CM | POA: Diagnosis not present

## 2022-03-22 DIAGNOSIS — Z86718 Personal history of other venous thrombosis and embolism: Secondary | ICD-10-CM | POA: Diagnosis not present

## 2022-03-22 DIAGNOSIS — C50412 Malignant neoplasm of upper-outer quadrant of left female breast: Secondary | ICD-10-CM | POA: Diagnosis not present

## 2022-03-22 LAB — RAD ONC ARIA SESSION SUMMARY
Course Elapsed Days: 18
Plan Fractions Treated to Date: 15
Plan Prescribed Dose Per Fraction: 2.66 Gy
Plan Total Fractions Prescribed: 16
Plan Total Prescribed Dose: 42.56 Gy
Reference Point Dosage Given to Date: 39.9 Gy
Reference Point Session Dosage Given: 2.66 Gy
Session Number: 15

## 2022-03-23 ENCOUNTER — Ambulatory Visit: Payer: Medicare PPO

## 2022-03-23 ENCOUNTER — Other Ambulatory Visit: Payer: Self-pay

## 2022-03-24 ENCOUNTER — Other Ambulatory Visit: Payer: Self-pay | Admitting: Family Medicine

## 2022-03-25 ENCOUNTER — Ambulatory Visit
Admission: RE | Admit: 2022-03-25 | Discharge: 2022-03-25 | Disposition: A | Discharge status: Home / Self Care | Payer: Medicare PPO | Source: Ambulatory Visit | Attending: Radiation Oncology | Admitting: Radiation Oncology

## 2022-03-25 ENCOUNTER — Ambulatory Visit: Payer: Medicare PPO

## 2022-03-25 ENCOUNTER — Other Ambulatory Visit: Payer: Self-pay

## 2022-03-25 DIAGNOSIS — Z9221 Personal history of antineoplastic chemotherapy: Secondary | ICD-10-CM | POA: Diagnosis not present

## 2022-03-25 DIAGNOSIS — Z791 Long term (current) use of non-steroidal anti-inflammatories (NSAID): Secondary | ICD-10-CM | POA: Diagnosis not present

## 2022-03-25 DIAGNOSIS — Z510 Encounter for antineoplastic radiation therapy: Secondary | ICD-10-CM | POA: Diagnosis not present

## 2022-03-25 DIAGNOSIS — Z170 Estrogen receptor positive status [ER+]: Secondary | ICD-10-CM | POA: Diagnosis not present

## 2022-03-25 DIAGNOSIS — Z86718 Personal history of other venous thrombosis and embolism: Secondary | ICD-10-CM | POA: Diagnosis not present

## 2022-03-25 DIAGNOSIS — K219 Gastro-esophageal reflux disease without esophagitis: Secondary | ICD-10-CM | POA: Diagnosis not present

## 2022-03-25 DIAGNOSIS — Z171 Estrogen receptor negative status [ER-]: Secondary | ICD-10-CM | POA: Diagnosis not present

## 2022-03-25 DIAGNOSIS — Z7982 Long term (current) use of aspirin: Secondary | ICD-10-CM | POA: Diagnosis not present

## 2022-03-25 DIAGNOSIS — Z79899 Other long term (current) drug therapy: Secondary | ICD-10-CM | POA: Diagnosis not present

## 2022-03-25 DIAGNOSIS — E785 Hyperlipidemia, unspecified: Secondary | ICD-10-CM | POA: Diagnosis not present

## 2022-03-25 DIAGNOSIS — C50412 Malignant neoplasm of upper-outer quadrant of left female breast: Secondary | ICD-10-CM | POA: Diagnosis not present

## 2022-03-25 LAB — RAD ONC ARIA SESSION SUMMARY
Course Elapsed Days: 21
Plan Fractions Treated to Date: 16
Plan Prescribed Dose Per Fraction: 2.66 Gy
Plan Total Fractions Prescribed: 16
Plan Total Prescribed Dose: 42.56 Gy
Reference Point Dosage Given to Date: 42.56 Gy
Reference Point Session Dosage Given: 2.66 Gy
Session Number: 16

## 2022-03-26 ENCOUNTER — Ambulatory Visit
Admission: RE | Admit: 2022-03-26 | Discharge: 2022-03-26 | Disposition: A | Discharge status: Home / Self Care | Payer: Medicare PPO | Source: Ambulatory Visit | Attending: Radiation Oncology | Admitting: Radiation Oncology

## 2022-03-26 ENCOUNTER — Ambulatory Visit: Payer: Medicare PPO

## 2022-03-26 ENCOUNTER — Other Ambulatory Visit: Payer: Self-pay

## 2022-03-26 DIAGNOSIS — Z171 Estrogen receptor negative status [ER-]: Secondary | ICD-10-CM | POA: Diagnosis not present

## 2022-03-26 DIAGNOSIS — C50412 Malignant neoplasm of upper-outer quadrant of left female breast: Secondary | ICD-10-CM | POA: Diagnosis not present

## 2022-03-26 DIAGNOSIS — Z9221 Personal history of antineoplastic chemotherapy: Secondary | ICD-10-CM | POA: Diagnosis not present

## 2022-03-26 DIAGNOSIS — E785 Hyperlipidemia, unspecified: Secondary | ICD-10-CM | POA: Diagnosis not present

## 2022-03-26 DIAGNOSIS — Z791 Long term (current) use of non-steroidal anti-inflammatories (NSAID): Secondary | ICD-10-CM | POA: Diagnosis not present

## 2022-03-26 DIAGNOSIS — Z79899 Other long term (current) drug therapy: Secondary | ICD-10-CM | POA: Diagnosis not present

## 2022-03-26 DIAGNOSIS — K219 Gastro-esophageal reflux disease without esophagitis: Secondary | ICD-10-CM | POA: Diagnosis not present

## 2022-03-26 DIAGNOSIS — Z86718 Personal history of other venous thrombosis and embolism: Secondary | ICD-10-CM | POA: Diagnosis not present

## 2022-03-26 DIAGNOSIS — Z7982 Long term (current) use of aspirin: Secondary | ICD-10-CM | POA: Diagnosis not present

## 2022-03-26 LAB — RAD ONC ARIA SESSION SUMMARY
Course Elapsed Days: 22
Plan Fractions Treated to Date: 1
Plan Prescribed Dose Per Fraction: 2 Gy
Plan Total Fractions Prescribed: 5
Plan Total Prescribed Dose: 10 Gy
Reference Point Dosage Given to Date: 2 Gy
Reference Point Session Dosage Given: 2 Gy
Session Number: 17

## 2022-03-26 NOTE — Telephone Encounter (Signed)
Med refilled once  

## 2022-03-26 NOTE — Telephone Encounter (Signed)
Pt hasn't been seen in over a year, please schedule CPE and route back to me to refill. Thanks

## 2022-03-26 NOTE — Telephone Encounter (Signed)
Patient said that she was advised by Dr Glori Bickers to schedule with her once shes done undergoing cancer treatment,she said she will call then and set up her physical.

## 2022-03-27 ENCOUNTER — Encounter: Payer: Self-pay | Admitting: Oncology

## 2022-03-27 ENCOUNTER — Ambulatory Visit
Admission: RE | Admit: 2022-03-27 | Discharge: 2022-03-27 | Disposition: A | Discharge status: Home / Self Care | Payer: Medicare PPO | Source: Ambulatory Visit | Attending: Radiation Oncology | Admitting: Radiation Oncology

## 2022-03-27 ENCOUNTER — Other Ambulatory Visit: Payer: Self-pay

## 2022-03-27 ENCOUNTER — Ambulatory Visit: Payer: Medicare PPO

## 2022-03-27 ENCOUNTER — Inpatient Hospital Stay: Payer: Medicare PPO

## 2022-03-27 DIAGNOSIS — E785 Hyperlipidemia, unspecified: Secondary | ICD-10-CM | POA: Diagnosis not present

## 2022-03-27 DIAGNOSIS — C50412 Malignant neoplasm of upper-outer quadrant of left female breast: Secondary | ICD-10-CM | POA: Diagnosis not present

## 2022-03-27 DIAGNOSIS — Z79899 Other long term (current) drug therapy: Secondary | ICD-10-CM | POA: Diagnosis not present

## 2022-03-27 DIAGNOSIS — Z7982 Long term (current) use of aspirin: Secondary | ICD-10-CM | POA: Diagnosis not present

## 2022-03-27 DIAGNOSIS — Z171 Estrogen receptor negative status [ER-]: Secondary | ICD-10-CM | POA: Diagnosis not present

## 2022-03-27 DIAGNOSIS — K219 Gastro-esophageal reflux disease without esophagitis: Secondary | ICD-10-CM | POA: Diagnosis not present

## 2022-03-27 DIAGNOSIS — Z9221 Personal history of antineoplastic chemotherapy: Secondary | ICD-10-CM | POA: Diagnosis not present

## 2022-03-27 DIAGNOSIS — Z86718 Personal history of other venous thrombosis and embolism: Secondary | ICD-10-CM | POA: Diagnosis not present

## 2022-03-27 DIAGNOSIS — Z791 Long term (current) use of non-steroidal anti-inflammatories (NSAID): Secondary | ICD-10-CM | POA: Diagnosis not present

## 2022-03-27 LAB — RAD ONC ARIA SESSION SUMMARY
Course Elapsed Days: 23
Plan Fractions Treated to Date: 2
Plan Prescribed Dose Per Fraction: 2 Gy
Plan Total Fractions Prescribed: 5
Plan Total Prescribed Dose: 10 Gy
Reference Point Dosage Given to Date: 4 Gy
Reference Point Session Dosage Given: 2 Gy
Session Number: 18

## 2022-03-31 ENCOUNTER — Other Ambulatory Visit: Payer: Self-pay | Admitting: Family Medicine

## 2022-04-01 ENCOUNTER — Encounter: Payer: Self-pay | Admitting: Oncology

## 2022-04-01 ENCOUNTER — Other Ambulatory Visit: Payer: Self-pay

## 2022-04-01 ENCOUNTER — Inpatient Hospital Stay: Payer: Medicare PPO

## 2022-04-01 ENCOUNTER — Ambulatory Visit: Payer: Medicare PPO

## 2022-04-01 ENCOUNTER — Ambulatory Visit
Admission: RE | Admit: 2022-04-01 | Discharge: 2022-04-01 | Disposition: A | Discharge status: Home / Self Care | Payer: Medicare PPO | Source: Ambulatory Visit | Attending: Radiation Oncology | Admitting: Radiation Oncology

## 2022-04-01 ENCOUNTER — Inpatient Hospital Stay: Payer: Medicare PPO | Admitting: Oncology

## 2022-04-01 VITALS — BP 147/84 | HR 65 | Temp 98.1°F | Resp 16 | Wt 183.2 lb

## 2022-04-01 DIAGNOSIS — Z853 Personal history of malignant neoplasm of breast: Secondary | ICD-10-CM

## 2022-04-01 DIAGNOSIS — K219 Gastro-esophageal reflux disease without esophagitis: Secondary | ICD-10-CM | POA: Diagnosis not present

## 2022-04-01 DIAGNOSIS — Z171 Estrogen receptor negative status [ER-]: Secondary | ICD-10-CM | POA: Diagnosis not present

## 2022-04-01 DIAGNOSIS — Z79899 Other long term (current) drug therapy: Secondary | ICD-10-CM | POA: Diagnosis not present

## 2022-04-01 DIAGNOSIS — Z791 Long term (current) use of non-steroidal anti-inflammatories (NSAID): Secondary | ICD-10-CM | POA: Diagnosis not present

## 2022-04-01 DIAGNOSIS — Z7982 Long term (current) use of aspirin: Secondary | ICD-10-CM | POA: Diagnosis not present

## 2022-04-01 DIAGNOSIS — Z08 Encounter for follow-up examination after completed treatment for malignant neoplasm: Secondary | ICD-10-CM

## 2022-04-01 DIAGNOSIS — Z86718 Personal history of other venous thrombosis and embolism: Secondary | ICD-10-CM | POA: Diagnosis not present

## 2022-04-01 DIAGNOSIS — E785 Hyperlipidemia, unspecified: Secondary | ICD-10-CM | POA: Diagnosis not present

## 2022-04-01 DIAGNOSIS — C50412 Malignant neoplasm of upper-outer quadrant of left female breast: Secondary | ICD-10-CM | POA: Diagnosis not present

## 2022-04-01 DIAGNOSIS — Z9221 Personal history of antineoplastic chemotherapy: Secondary | ICD-10-CM | POA: Diagnosis not present

## 2022-04-01 LAB — COMPREHENSIVE METABOLIC PANEL
ALT: 14 U/L (ref 0–44)
AST: 21 U/L (ref 15–41)
Albumin: 3.9 g/dL (ref 3.5–5.0)
Alkaline Phosphatase: 56 U/L (ref 38–126)
Anion gap: 5 (ref 5–15)
BUN: 18 mg/dL (ref 8–23)
CO2: 23 mmol/L (ref 22–32)
Calcium: 9.1 mg/dL (ref 8.9–10.3)
Chloride: 108 mmol/L (ref 98–111)
Creatinine, Ser: 0.6 mg/dL (ref 0.44–1.00)
GFR, Estimated: >60 mL/min (ref >60)
Glucose, Bld: 121 mg/dL — ABNORMAL HIGH (ref 70–99)
Potassium: 3.7 mmol/L (ref 3.5–5.1)
Sodium: 136 mmol/L (ref 135–145)
Total Bilirubin: 0.3 mg/dL (ref 0.3–1.2)
Total Protein: 6.8 g/dL (ref 6.5–8.1)

## 2022-04-01 LAB — RAD ONC ARIA SESSION SUMMARY
Course Elapsed Days: 28
Plan Fractions Treated to Date: 3
Plan Prescribed Dose Per Fraction: 2 Gy
Plan Total Fractions Prescribed: 5
Plan Total Prescribed Dose: 10 Gy
Reference Point Dosage Given to Date: 6 Gy
Reference Point Session Dosage Given: 2 Gy
Session Number: 19

## 2022-04-01 LAB — CBC WITH DIFFERENTIAL/PLATELET
Abs Immature Granulocytes: 0.01 10*3/uL (ref 0.00–0.07)
Basophils Absolute: 0 10*3/uL (ref 0.0–0.1)
Basophils Relative: 1 %
Eosinophils Absolute: 0.4 10*3/uL (ref 0.0–0.5)
Eosinophils Relative: 10 %
HCT: 35.3 % — ABNORMAL LOW (ref 36.0–46.0)
Hemoglobin: 11.4 g/dL — ABNORMAL LOW (ref 12.0–15.0)
Immature Granulocytes: 0 %
Lymphocytes Relative: 13 %
Lymphs Abs: 0.6 10*3/uL — ABNORMAL LOW (ref 0.7–4.0)
MCH: 31.7 pg (ref 26.0–34.0)
MCHC: 32.3 g/dL (ref 30.0–36.0)
MCV: 98.1 fL (ref 80.0–100.0)
Monocytes Absolute: 0.6 10*3/uL (ref 0.1–1.0)
Monocytes Relative: 13 %
Neutro Abs: 2.8 10*3/uL (ref 1.7–7.7)
Neutrophils Relative %: 63 %
Platelets: 270 10*3/uL (ref 150–400)
RBC: 3.6 MIL/uL — ABNORMAL LOW (ref 3.87–5.11)
RDW: 13.4 % (ref 11.5–15.5)
WBC: 4.4 10*3/uL (ref 4.0–10.5)
nRBC: 0 % (ref 0.0–0.2)

## 2022-04-01 NOTE — Progress Notes (Signed)
Hematology/Oncology Consult note [facility]  Telephone:([phone] [phone]  Patient Care Team: Tower, Wynelle Fanny, MD as PCP - General Daiva Huge, RN as Oncology Nurse Navigator   Name of the patient: Megan Harrison  [id number]  [birth year]   Date of visit: 04/01/22  Diagnosis- pathological prognostic stage Ia invasive mammary carcinoma of the left breast pT1b N0 M0 ER/PR HER2 negative   Chief complaint/ Reason for visit-routine follow-up of breast cancer  Heme/Onc history: Patient is a 66 year old female who underwent a screening bilateral mammogram in May 2023 which showed a possible asymmetry in the left breast.  This was followed by diagnostic mammogram and ultrasound which showed a hypoechoic mass in the left breast 3 x 4 x 6 mm.  No abnormality noted in the left axillary lymph nodes.  This was biopsied and was consistent with invasive mammary carcinoma 4 mm grade 3 ER negative less than 1%, PR negative less than 1% and HER2 negative.   Final pathology showed 8 mm grade 3 triple negative tumor with negative margins.3 sentinel lymph nodes negative for malignancy.  Plan is for adjuvant TC chemotherapy x4 cycles followed by radiation   Patient on to have extensive right upper extremity DVT related to port placement after 2 cycles of chemotherapy.  Port was taken out after completing chemotherapy and she is presently on Eliquis  Patient completed 4 cycles of adjuvant TC chemotherapy on 02/13/2022  Interval history-right upper extremity swelling has improved but she still has some intermittent stinging pain in her right forearm.  Bilateral lower extremity edema is improving with compression stockings.  ECOG PS- 1 Pain scale- 2   Review of systems- Review of Systems  Constitutional:  Positive for malaise/fatigue. Negative for chills, fever and weight loss.  HENT:  Negative for congestion, ear discharge and nosebleeds.   Eyes:  Negative for  blurred vision.  Respiratory:  Negative for cough, hemoptysis, sputum production, shortness of breath and wheezing.   Cardiovascular:  Positive for leg swelling. Negative for chest pain, palpitations, orthopnea and claudication.  Gastrointestinal:  Negative for abdominal pain, blood in stool, constipation, diarrhea, heartburn, melena, nausea and vomiting.  Genitourinary:  Negative for dysuria, flank pain, frequency, hematuria and urgency.  Musculoskeletal:  Negative for back pain, joint pain and myalgias.  Skin:  Negative for rash.  Neurological:  Negative for dizziness, tingling, focal weakness, seizures, weakness and headaches.  Endo/Heme/Allergies:  Does not bruise/bleed easily.  Psychiatric/Behavioral:  Negative for depression and suicidal ideas. The patient does not have insomnia.       Allergies  Allergen Reactions   Codeine     REACTION: syncope   Sumatriptan     REACTION: choking sensation     Past Medical History:  Diagnosis Date   Anemia    Cutaneous sarcoidosis    Degenerative disc disease    Diverticulosis    Dysplastic nevus 11/19/2017   L mid back 6.0 cm lat to spine   Foot pain    GERD (gastroesophageal reflux disease)    Hyperlipidemia    Malignant neoplasm of upper-outer quadrant of left breast in female, estrogen receptor negative ([place]) 11/04/2021   Migraine    Shingles    eyes     Past Surgical History:  Procedure Laterality Date   2D Echo  1999   Negative   ABDOMINAL HYSTERECTOMY  03/1998   APPENDECTOMY     Arm lesion, cutaneous sarcoid  12/2005   with normal CXR and ANA  BREAST BIOPSY Left 10/23/2021   US biopsy/ heart clip/ [facility]   BREAST LUMPECTOMY WITH RADIOFREQUENCY TAG IDENTIFICATION Left 82/80/0349   lt br rf tag placement   BREAST LUMPECTOMY,RADIO FREQ LOCALIZER,AXILLARY SENTINEL LYMPH NODE BIOPSY Left 11/20/2021   Procedure: BREAST LUMPECTOMY,RADIO FREQ LOCALIZER,AXILLARY SENTINEL LYMPH NODE BIOPSY;  Surgeon: Olean Ree, MD;   Location: [facility];  Service: General;  Laterality: Left;   CESAREAN SECTION     x2   COLONOSCOPY  11/0/2009,08/18/13   diverticulosis and hemorrhoids   DIAGNOSTIC LAPAROSCOPY     EYE SURGERY  10/13/2018   had shingles in left eye, had to have surgery, has contact lens in now.   FOOT NEUROMA SURGERY Left    LAPAROSCOPIC APPENDECTOMY N/A 02/18/2016   Procedure: APPENDECTOMY LAPAROSCOPIC;  Surgeon: Ralene Ok, MD;  Location: [place];  Service: General;  Laterality: N/A;   LUMBAR LAMINECTOMY  10/2017   PERIPHERAL VASCULAR THROMBECTOMY Right 01/16/2022   Procedure: PERIPHERAL VASCULAR THROMBECTOMY;  Surgeon: Algernon Huxley, MD;  Location: [place] [facility];  Service: Cardiovascular;  Laterality: Right;   PORTA CATH REMOVAL N/A 02/28/2022   Procedure: PORTA CATH REMOVAL;  Surgeon: Algernon Huxley, MD;  Location: [place] [facility];  Service: Cardiovascular;  Laterality: N/A;   PORTACATH PLACEMENT Right 12/05/2021   Procedure: INSERTION PORT-A-CATH;  Surgeon: Olean Ree, MD;  Location: [facility];  Service: General;  Laterality: Right;   ROTATOR CUFF REPAIR Left    TONSILLECTOMY     UPPER GASTROINTESTINAL ENDOSCOPY  11/15/2010   non-erosive gastritis, H. pylori negative    Social History   Socioeconomic History   Marital status: Married    Spouse name: Not on file   Number of children: Not on file   Years of education: Not on file   Highest education level: Not on file  Occupational History   Occupation: Works for [employer]    Employer: RETIRED  Tobacco Use   Smoking status: Never   Smokeless tobacco: Never  Vaping Use   Vaping Use: Never used  Substance and Sexual Activity   Alcohol use: No    Alcohol/week: 0.0 standard drinks of alcohol   Drug use: No   Sexual activity: Yes  Other Topics Concern   Not on file  Social History Narrative   Regular exercise:  Curves, elliptical, walking.   Social Determinants of Health   Financial Resource Strain: Low Risk   (02/01/2020)   Overall Financial Resource Strain (CARDIA)    Difficulty of Paying Living Expenses: Not hard at all  Food Insecurity: No Food Insecurity (02/01/2020)   Hunger Vital Sign    Worried About Running Out of Food in the Last Year: Never true    Ran Out of Food in the Last Year: Never true  Transportation Needs: No Transportation Needs (02/01/2020)   PRAPARE - Hydrologist (Medical): No    Lack of Transportation (Non-Medical): No  Physical Activity: Insufficiently Active (02/01/2020)   Exercise Vital Sign    Days of Exercise per Week: 4 days    Minutes of Exercise per Session: 30 min  Stress: No Stress Concern Present (02/01/2020)   [place]    Feeling of Stress : Not at all  Social Connections: Not on file  Intimate Partner Violence: Not At Risk (02/01/2020)   Humiliation, Afraid, Rape, and Kick questionnaire    Fear of Current or Ex-Partner: No    Emotionally Abused: No  Physically Abused: No    Sexually Abused: No    Family History  Problem Relation Age of Onset   Colon cancer Brother        early 73's   Stomach cancer Brother    Heart disease Mother        CAD   Alcohol abuse Mother    Lung cancer Father    Heart disease Brother        CAD   Throat cancer Brother    Heart disease Brother        CAD, pre diabetic, obese   Colon polyps Sister    Breast cancer Neg Hx    Rectal cancer Neg Hx      Current Outpatient Medications:    acetaminophen (TYLENOL) 500 MG tablet, Take 2 tablets (1,000 mg total) by mouth every 6 (six) hours as needed for mild pain., Disp: , Rfl:    apixaban (ELIQUIS) 5 MG TABS tablet, Take 1 tablet (5 mg total) by mouth 2 (two) times daily., Disp: 60 tablet, Rfl: 2   atorvastatin (LIPITOR) 10 MG tablet, Take 1 tablet (10 mg total) by mouth daily., Disp: 90 tablet, Rfl: 3   calcium carbonate (TUMS - DOSED IN MG ELEMENTAL CALCIUM) 500 MG chewable  tablet, Chew 1 tablet by mouth as needed for indigestion or heartburn., Disp: , Rfl:    Cholecalciferol (VITAMIN D3) 50 MCG (2000 UT) TABS, Take 1 tablet by mouth daily., Disp: , Rfl:    ferrous sulfate 325 (65 FE) MG tablet, Take 325 mg by mouth daily., Disp: , Rfl:    fluticasone (FLONASE) 50 MCG/ACT nasal spray, USE 1 SPRAY(S) IN EACH NOSTRIL ONCE DAILY AS DIRECTED, Disp: 16 g, Rfl: 0   gabapentin (NEURONTIN) 300 MG capsule, Take 300 mg by mouth daily after supper., Disp: , Rfl:    Magnesium Hydroxide (DULCOLAX PO), Take 1 tablet by mouth 2 (two) times daily., Disp: , Rfl:    meloxicam (MOBIC) 15 MG tablet, Take 15 mg by mouth daily., Disp: , Rfl:    Multiple Vitamins-Minerals (ONE-A-DAY WOMENS 50 PLUS PO), Take 1 tablet by mouth daily., Disp: , Rfl:    pantoprazole (PROTONIX) 20 MG tablet, Take 1 tablet (20 mg total) by mouth daily., Disp: 30 tablet, Rfl: 2   prednisoLONE acetate (PRED FORTE) 1 % ophthalmic suspension, Place 1 drop into the left eye every other day., Disp: , Rfl:    Timolol Maleate 0.5 % (DAILY) SOLN, Place 1 drop into the left eye daily at 6 (six) AM., Disp: , Rfl:  No current facility-administered medications for this visit.  Facility-Administered Medications Ordered in Other Visits:    heparin lock flush 100 UNIT/ML injection, , , ,    palonosetron (ALOXI) 0.25 MG/5ML injection, , , ,   Physical exam:  Vitals:   04/01/22 1010  BP: (!) 147/84  Pulse: 65  Resp: 16  Temp: 98.1 F (36.7 C)  SpO2: 98%  Weight: 183 lb 3.2 oz (83.1 kg)   Physical Exam Cardiovascular:     Rate and Rhythm: Normal rate and regular rhythm.     Heart sounds: Normal heart sounds.  Pulmonary:     Effort: Pulmonary effort is normal.     Breath sounds: Normal breath sounds.  Musculoskeletal:     Comments: Bilateral +1 edema  Skin:    General: Skin is warm and dry.  Neurological:     Mental Status: She is alert and oriented to person, place, and time.  Latest Ref Rng & Units  04/01/2022    9:48 AM  CMP  Glucose 70 - 99 mg/dL 121   BUN 8 - 23 mg/dL 18   Creatinine 0.44 - 1.00 mg/dL 0.60   Sodium 135 - 145 mmol/L 136   Potassium 3.5 - 5.1 mmol/L 3.7   Chloride 98 - 111 mmol/L 108   CO2 22 - 32 mmol/L 23   Calcium 8.9 - 10.3 mg/dL 9.1   Total Protein 6.5 - 8.1 g/dL 6.8   Total Bilirubin 0.3 - 1.2 mg/dL 0.3   Alkaline Phos 38 - 126 U/L 56   AST 15 - 41 U/L 21   ALT 0 - 44 U/L 14       Latest Ref Rng & Units 04/01/2022    9:48 AM  CBC  WBC 4.0 - 10.5 K/uL 4.4   Hemoglobin 12.0 - 15.0 g/dL 11.4   Hematocrit 36.0 - 46.0 % 35.3   Platelets 150 - 400 K/uL 270      Assessment and plan- Patient is a 66 y.o. female with stage I triple negative left breast cancer pT1b N0 M0.  She is s/p 4 cycles of adjuvant TC chemotherapy and adjuvant radiation therapy and here for routine follow-up  Bilateral lower extremity edema has improved with compression stockings.  It is likely to get better in due course.  I am holding off on any diuretics at this time.  Right upper extremity swelling has improved after port was taken out and s/p thrombolysis for her DVT.  I am referring her to [facility] as well at this time and she will continue taking Eliquis for 3 months ending in December 2023.  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  She is not on any medications for the same.  Chemo-induced anemia: Overall has improved.  I will see her back in 4 months no labs   Visit Diagnosis 1. Encounter for follow-up surveillance of breast cancer      Dr. Randa Evens, MD, MPH [facility] [phone] 04/01/2022 4:41 PM

## 2022-04-01 NOTE — Progress Notes (Signed)
Pt has a rash from radiatoin  is using cortisone and seems to help; sees Dr. Donella Stade tomorrow and will discuss with him.

## 2022-04-01 NOTE — Telephone Encounter (Signed)
  Encourage patient to contact the pharmacy for refills or they can request refills through [facility] Wasn't to know status on RX atorvastatin (LIPITOR) 10 MG tablet   LAST APPOINTMENT DATE:  Please schedule appointment if longer than 1 year  NEXT APPOINTMENT DATE:  MEDICATION:fluticasone (FLONASE) 50 MCG/ACT nasal spray   Is the patient out of medication?   PHARMACY: [facility] [place], [place] -    Let patient know to contact pharmacy at the end of the day to make sure medication is ready.  Please notify patient to allow 48-72 hours to process  CLINICAL FILLS OUT ALL BELOW:   LAST REFILL:  QTY:  REFILL DATE:    OTHER COMMENTS:    Okay for refill?  Please advise

## 2022-04-02 ENCOUNTER — Other Ambulatory Visit: Payer: Self-pay

## 2022-04-02 ENCOUNTER — Ambulatory Visit
Admission: RE | Admit: 2022-04-02 | Discharge: 2022-04-02 | Disposition: A | Discharge status: Home / Self Care | Payer: Medicare PPO | Source: Ambulatory Visit | Attending: Radiation Oncology | Admitting: Radiation Oncology

## 2022-04-02 DIAGNOSIS — K219 Gastro-esophageal reflux disease without esophagitis: Secondary | ICD-10-CM | POA: Diagnosis not present

## 2022-04-02 DIAGNOSIS — Z79899 Other long term (current) drug therapy: Secondary | ICD-10-CM | POA: Diagnosis not present

## 2022-04-02 DIAGNOSIS — Z171 Estrogen receptor negative status [ER-]: Secondary | ICD-10-CM | POA: Diagnosis not present

## 2022-04-02 DIAGNOSIS — Z9221 Personal history of antineoplastic chemotherapy: Secondary | ICD-10-CM | POA: Diagnosis not present

## 2022-04-02 DIAGNOSIS — C50412 Malignant neoplasm of upper-outer quadrant of left female breast: Secondary | ICD-10-CM | POA: Diagnosis not present

## 2022-04-02 DIAGNOSIS — Z7982 Long term (current) use of aspirin: Secondary | ICD-10-CM | POA: Diagnosis not present

## 2022-04-02 DIAGNOSIS — Z791 Long term (current) use of non-steroidal anti-inflammatories (NSAID): Secondary | ICD-10-CM | POA: Diagnosis not present

## 2022-04-02 DIAGNOSIS — Z86718 Personal history of other venous thrombosis and embolism: Secondary | ICD-10-CM | POA: Diagnosis not present

## 2022-04-02 DIAGNOSIS — E785 Hyperlipidemia, unspecified: Secondary | ICD-10-CM | POA: Diagnosis not present

## 2022-04-02 LAB — RAD ONC ARIA SESSION SUMMARY
Course Elapsed Days: 29
Plan Fractions Treated to Date: 4
Plan Prescribed Dose Per Fraction: 2 Gy
Plan Total Fractions Prescribed: 5
Plan Total Prescribed Dose: 10 Gy
Reference Point Dosage Given to Date: 8 Gy
Reference Point Session Dosage Given: 2 Gy
Session Number: 20

## 2022-04-02 MED ORDER — FLUTICASONE PROPIONATE 50 MCG/ACT NA SUSP: NASAL | 0 refills | Status: DC

## 2022-04-03 ENCOUNTER — Encounter: Payer: Self-pay | Admitting: *Deleted

## 2022-04-03 ENCOUNTER — Ambulatory Visit
Admission: RE | Admit: 2022-04-03 | Discharge: 2022-04-03 | Disposition: A | Discharge status: Home / Self Care | Payer: Medicare PPO | Source: Ambulatory Visit | Attending: Radiation Oncology | Admitting: Radiation Oncology

## 2022-04-03 ENCOUNTER — Other Ambulatory Visit: Payer: Self-pay

## 2022-04-03 DIAGNOSIS — Z791 Long term (current) use of non-steroidal anti-inflammatories (NSAID): Secondary | ICD-10-CM | POA: Diagnosis not present

## 2022-04-03 DIAGNOSIS — Z86718 Personal history of other venous thrombosis and embolism: Secondary | ICD-10-CM | POA: Diagnosis not present

## 2022-04-03 DIAGNOSIS — E785 Hyperlipidemia, unspecified: Secondary | ICD-10-CM | POA: Diagnosis not present

## 2022-04-03 DIAGNOSIS — K219 Gastro-esophageal reflux disease without esophagitis: Secondary | ICD-10-CM | POA: Diagnosis not present

## 2022-04-03 DIAGNOSIS — Z171 Estrogen receptor negative status [ER-]: Secondary | ICD-10-CM | POA: Diagnosis not present

## 2022-04-03 DIAGNOSIS — C50412 Malignant neoplasm of upper-outer quadrant of left female breast: Secondary | ICD-10-CM | POA: Diagnosis not present

## 2022-04-03 DIAGNOSIS — Z9221 Personal history of antineoplastic chemotherapy: Secondary | ICD-10-CM | POA: Diagnosis not present

## 2022-04-03 DIAGNOSIS — Z7982 Long term (current) use of aspirin: Secondary | ICD-10-CM | POA: Diagnosis not present

## 2022-04-03 DIAGNOSIS — Z79899 Other long term (current) drug therapy: Secondary | ICD-10-CM | POA: Diagnosis not present

## 2022-04-03 LAB — RAD ONC ARIA SESSION SUMMARY
Course Elapsed Days: 30
Plan Fractions Treated to Date: 5
Plan Prescribed Dose Per Fraction: 2 Gy
Plan Total Fractions Prescribed: 5
Plan Total Prescribed Dose: 10 Gy
Reference Point Dosage Given to Date: 10 Gy
Reference Point Session Dosage Given: 2 Gy
Session Number: 21

## 2022-04-05 ENCOUNTER — Ambulatory Visit (INDEPENDENT_AMBULATORY_CARE_PROVIDER_SITE_OTHER): Payer: Medicare PPO

## 2022-04-05 VITALS — Ht 63.5 in | Wt 183.0 lb

## 2022-04-05 DIAGNOSIS — Z0000 Encounter for general adult medical examination without abnormal findings: Secondary | ICD-10-CM | POA: Diagnosis not present

## 2022-04-05 NOTE — Progress Notes (Signed)
Subjective:   Megan Harrison is a 66 y.o. female who presents for Medicare Annual (Subsequent) preventive examination.  Review of Systems    No ROS.  Medicare Wellness Virtual Visit.  Visual/audio telehealth visit, UTA vital signs.   See social history for additional risk factors.   Cardiac Risk Factors include: advanced age (>69mn, >>16women)     Objective:    Today's Vitals   04/05/22 1337  Weight: 183 lb (83 kg)  Height: 5' 3.5" (1.613 m)   Body mass index is 31.91 kg/m.     04/05/2022    1:43 PM 04/01/2022   10:04 AM 02/28/2022    7:38 AM 02/13/2022    9:11 AM 01/28/2022    9:41 AM 01/23/2022    9:20 AM 01/17/2022   10:57 AM  Advanced Directives  Does Patient Have a Medical Advance Directive? Yes No Yes No No No No  Type of AParamed[place]of [place] will  [place] Mathe[place] will      Does patient want to make changes to medical advance directive? No - Patient declined        Copy of [place]? No - copy requested  No - copy requested      Would patient like information on creating a medical advance directive?  No - Patient declined  No - Patient declined No - Patient declined No - Patient declined No - Patient declined    Current Medications (verified) Outpatient Encounter Medications as of 04/05/2022  Medication Sig   acetaminophen (TYLENOL) 500 MG tablet Take 2 tablets (1,000 mg total) by mouth every 6 (six) hours as needed for mild pain.   apixaban (ELIQUIS) 5 MG TABS tablet Take 1 tablet (5 mg total) by mouth 2 (two) times daily.   atorvastatin (LIPITOR) 10 MG tablet Take 1 tablet by mouth once daily   calcium carbonate (TUMS - DOSED IN MG ELEMENTAL CALCIUM) 500 MG chewable tablet Chew 1 tablet by mouth as needed for indigestion or heartburn.   Cholecalciferol (VITAMIN D3) 50 MCG (2000 UT) TABS Take 1 tablet by mouth daily.   ferrous sulfate 325 (65 FE) MG tablet Take 325 mg by mouth daily.    fluticasone (FLONASE) 50 MCG/ACT nasal spray USE 1 SPRAY(S) IN EACH NOSTRIL ONCE DAILY AS DIRECTED   gabapentin (NEURONTIN) 300 MG capsule Take 300 mg by mouth daily after supper.   Magnesium Hydroxide (DULCOLAX PO) Take 1 tablet by mouth 2 (two) times daily.   meloxicam (MOBIC) 15 MG tablet Take 15 mg by mouth daily.   Multiple Vitamins-Minerals (ONE-A-DAY WOMENS 50 PLUS PO) Take 1 tablet by mouth daily.   pantoprazole (PROTONIX) 20 MG tablet Take 1 tablet (20 mg total) by mouth daily.   prednisoLONE acetate (PRED FORTE) 1 % ophthalmic suspension Place 1 drop into the left eye every other day.   Timolol Maleate 0.5 % (DAILY) SOLN Place 1 drop into the left eye daily at 6 (six) AM.   [DISCONTINUED] prochlorperazine (COMPAZINE) 10 MG tablet Take 1 tablet (10 mg total) by mouth every 6 (six) hours as needed (Nausea or vomiting). (Patient not taking: Reported on 12/12/2021)   Facility-Administered Encounter Medications as of 04/05/2022  Medication   heparin lock flush 100 UNIT/ML injection   palonosetron (ALOXI) 0.25 MG/5ML injection    Allergies (verified) Codeine and Sumatriptan   History: Past Medical History:  Diagnosis Date   Anemia    Cutaneous sarcoidosis  Degenerative disc disease    Diverticulosis    Dysplastic nevus 11/19/2017   L mid back 6.0 cm lat to spine   Foot pain    GERD (gastroesophageal reflux disease)    Hyperlipidemia    Malignant neoplasm of upper-outer quadrant of left breast in female, estrogen receptor negative ([place]) 11/04/2021   Migraine    Shingles    eyes   Past Surgical History:  Procedure Laterality Date   2D Echo  1999   Negative   ABDOMINAL HYSTERECTOMY  03/1998   APPENDECTOMY     Arm lesion, cutaneous sarcoid  12/2005   with normal CXR and ANA   BREAST BIOPSY Left 10/23/2021   [facility] biopsy[facility] heart clip/ [facility]   BREAST LUMPECTOMY WITH RADIOFREQUENCY TAG IDENTIFICATION Left 73/22/0254   lt br rf tag placement   BREAST LUMPECTOMY,RADIO FREQ  [place] BIOPSY Left 11/20/2021   Procedure: BREAST LUMPECTOMY,RADIO FREQ LOCALIZER,AXILLARY SENTINEL LYMPH NODE BIOPSY;  Surgeon: Olean Ree, MD;  Location: [facility];  Service: General;  Laterality: Left;   CESAREAN SECTION     x2   COLONOSCOPY  11/0/2009,08/18/13   diverticulosis and hemorrhoids   DIAGNOSTIC LAPAROSCOPY     EYE SURGERY  10/13/2018   had shingles in left eye, had to have surgery, has contact lens in now.   FOOT NEUROMA SURGERY Left    LAPAROSCOPIC APPENDECTOMY N/A 02/18/2016   Procedure: APPENDECTOMY LAPAROSCOPIC;  Surgeon: Ralene Ok, MD;  Location: [place];  Service: General;  Laterality: N/A;   LUMBAR LAMINECTOMY  10/2017   PERIPHERAL VASCULAR THROMBECTOMY Right 01/16/2022   Procedure: PERIPHERAL VASCULAR THROMBECTOMY;  Surgeon: Algernon Huxley, MD;  Location: [place] [facility];  Service: Cardiovascular;  Laterality: Right;   PORTA CATH REMOVAL N/A 02/28/2022   Procedure: PORTA CATH REMOVAL;  Surgeon: Algernon Huxley, MD;  Location: [place] [facility];  Service: Cardiovascular;  Laterality: N/A;   PORTACATH PLACEMENT Right 12/05/2021   Procedure: INSERTION PORT-A-CATH;  Surgeon: Olean Ree, MD;  Location: [facility];  Service: General;  Laterality: Right;   ROTATOR CUFF REPAIR Left    TONSILLECTOMY     UPPER GASTROINTESTINAL ENDOSCOPY  11/15/2010   non-erosive gastritis, H. pylori negative   Family History  Problem Relation Age of Onset   Colon cancer Brother        early 45's   Stomach cancer Brother    Heart disease Mother        CAD   Alcohol abuse Mother    Lung cancer Father    Heart disease Brother        CAD   Throat cancer Brother    Heart disease Brother        CAD, pre diabetic, obese   Colon polyps Sister    Breast cancer Neg Hx    Rectal cancer Neg Hx    Social History   Socioeconomic History   Marital status: Married    Spouse name: Not on file   Number of children: Not on file   Years of education: Not  on file   Highest education level: Not on file  Occupational History   Occupation: Works for [employer]    Employer: RETIRED  Tobacco Use   Smoking status: Never   Smokeless tobacco: Never  Vaping Use   Vaping Use: Never used  Substance and Sexual Activity   Alcohol use: No    Alcohol/week: 0.0 standard drinks of alcohol   Drug use: No   Sexual  activity: Yes  Other Topics Concern   Not on file  Social History Narrative   Regular exercise:  Curves, elliptical, walking.   Social Determinants of Health   Financial Resource Strain: Low Risk  (04/05/2022)   Overall Financial Resource Strain (CARDIA)    Difficulty of Paying Living Expenses: Not hard at all  Food Insecurity: No Food Insecurity (04/05/2022)   Hunger Vital Sign    Worried About Running Out of Food in the Last Year: Never true    Ran Out of Food in the Last Year: Never true  Transportation Needs: No Transportation Needs (04/05/2022)   PRAPARE - Hydrologist (Medical): No    Lack of Transportation (Non-Medical): No  Physical Activity: Insufficiently Active (02/01/2020)   Exercise Vital Sign    Days of Exercise per Week: 4 days    Minutes of Exercise per Session: 30 min  Stress: No Stress Concern Present (04/05/2022)   [place]    Feeling of Stress : Not at all  Social Connections: [place] (04/05/2022)   Social Connection and Isolation Panel [NHANES]    Frequency of Communication with Friends and Family: More than three times a week    Frequency of Social Gatherings with Friends and Family: More than three times a week    Attends Religious Services: More than 4 times per year    Active Member of [employer] or Organizations: Yes    Attends Music therapist: More than 4 times per year    Marital Status: Married    Tobacco Counseling Counseling given: Not Answered   Clinical Intake:  Pre-visit  preparation completed: Yes        Diabetes: No  How often do you need to have someone help you when you read instructions, pamphlets, or other written materials from your doctor or pharmacy?: 1 - Never    Interpreter Needed?: No      Activities of Daily Living    04/05/2022    1:44 PM 02/28/2022    7:28 AM  In your present state of health, do you have any difficulty performing the following activities:  Hearing? 0 0  Vision? 0 0  Difficulty concentrating or making decisions? 0 0  Walking or climbing stairs? 1 1  Comment L post surgery foot pain. Paces self with activity.   Dressing or bathing? 0 0  Doing errands, shopping? 0   Preparing Food and eating ? N   Using the Toilet? N   In the past six months, have you accidently leaked urine? N   Do you have problems with loss of bowel control? N   Managing your Medications? N   Managing your Finances? N   Housekeeping or managing your Housekeeping? Y   Comment Family assist as needed     Patient Care Team: [place], Wynelle Fanny, MD as PCP - General Daiva Huge, RN as Oncology Nurse Navigator  Indicate any recent Medical Services you may have received from other than [facility] providers in the past year (date may be approximate).     Assessment:   This is a routine wellness examination for [place].  I connected with  [name] on 04/05/22 by a audio enabled telemedicine application and verified that I am speaking with the correct person using two identifiers.  Patient Location: Home  Provider Location: Office/Clinic  I discussed the limitations of evaluation and management by telemedicine. The patient expressed understanding  and agreed to proceed.   Hearing/Vision screen Hearing Screening - Comments:: Patient is able to hear conversational tones without difficulty.  No issues reported.   Vision Screening - Comments:: Followed by [facility] Wears corrective lenses They have seen their ophthalmologist in the  last 12 months.    Dietary issues and exercise activities discussed:   Healthy diet Good water intake    Goals Addressed             This Visit's Progress    Maintain healthy lifestyle       Stay active Healthy diet Good water intake       Depression Screen    04/05/2022    1:41 PM 02/15/2021   10:36 AM 02/01/2020    1:21 PM 01/18/2019    2:25 PM 01/12/2018    2:39 PM 12/10/2016   12:03 PM 10/19/2012   10:57 AM  PHQ 2/9 Scores  PHQ - 2 Score 0 0 0 0 0 0 0  PHQ- 9 Score   0   0     Fall Risk    04/05/2022    1:56 PM 11/07/2021    3:59 PM 02/15/2021   10:37 AM 02/01/2020    1:20 PM 01/18/2019    2:24 PM  [place] in the past year?  0 0 0 0  Number falls in past yr:   0 0 0  Injury with Fall?   0 0 0  Risk for fall due to : Impaired mobility;Impaired balance/gait   No Fall Risks   Risk for fall due to: Comment L foot post surgery pain/numbness      Follow up Falls evaluation completed   Falls evaluation completed;Falls prevention discussed Falls evaluation completed    FALL RISK PREVENTION PERTAINING TO THE HOME: Home free of loose throw rugs in walkways, pet beds, electrical cords, etc? Yes  Adequate lighting in your home to reduce risk of falls? Yes   ASSISTIVE DEVICES UTILIZED TO PREVENT FALLS: Life alert? No  Use of a cane, walker or w/c? No  Grab bars in the bathroom? Yes Shower chair or bench in shower? Yes  Elevated toilet seat or a handicapped toilet? No   TIMED UP AND GO: Was the test performed? No .   Cognitive Function:    02/01/2020    1:23 PM  MMSE - Mini Mental State Exam  Orientation to time 5  Orientation to Place 5  Registration 3  Attention/ Calculation 5  Recall 3  Language- repeat 1        04/05/2022    1:46 PM  6CIT Screen  What Year? 0 points  What month? 0 points  What time? 0 points  Count back from 20 0 points  Months in reverse 0 points  Repeat phrase 0 points  Total Score 0 points     Immunizations Immunization History  Administered Date(s) Administered   Fluad Quad(high Dose 65+) 01/18/2019, 02/08/2020   Influenza Split 02/19/2011, 01/16/2021   Influenza Whole 04/09/2006, 02/13/2008, 01/19/2009, 02/02/2010   Influenza,inj,Quad PF,6+ Mos 01/02/2017, 01/04/2018   Influenza-Unspecified 01/05/2012, 01/13/2013, 02/04/2015, 01/09/2016   PFIZER(Purple Top)SARS-COV-2 Vaccination 07/09/2019, 07/30/2019   Pneumococcal Conjugate-13 01/18/2019   Pneumococcal Polysaccharide-23 02/08/2020   Td 08/30/2004   Tdap 11/04/2014   Zoster Recombinat (Shingrix) 05/21/2019   Zoster, Live 11/04/2013   Flu Vaccine status: Due, Education has been provided regarding the importance of this vaccine. Advised may receive this vaccine at local pharmacy or Health  Dept. Aware to provide a copy of the vaccination record if obtained from local pharmacy or Health Dept. Verbalized acceptance and understanding.  Covid-19 vaccine status: Completed vaccines x2.   Shingles vaccine- agrees to update immunization record upon completion of second dose.   Screening Tests Health Maintenance  Topic Date Due   COVID-19 Vaccine (3 - Pfizer risk series) 04/21/2022 (Originally 08/27/2019)   Zoster Vaccines- Shingrix (2 of 2) 07/05/2022 (Originally 07/16/2019)   INFLUENZA VACCINE  08/04/2022 (Originally 12/04/2021)   Hepatitis C Screening  06/08/2023 (Originally 10/17/1973)   Medicare Annual Wellness (AWV)  04/06/2023   MAMMOGRAM  09/26/2023   COLONOSCOPY (Pts 45-2yr Insurance coverage will need to be confirmed)  10/21/2023   DTaP/Tdap/Td (3 - Td or Tdap) 11/03/2024   Pneumonia Vaccine 68 Years old  Completed   DEXA SCAN  Completed   HPV VACCINES  Aged Out    Health Maintenance There are no preventive care reminders to display for this patient.  Lung Cancer Screening: (Low Dose CT Chest recommended if Age 68-80years, 30 pack-year currently smoking OR have quit w/in 15years.) does not qualify.   Vision  Screening: Recommended annual ophthalmology exams for early detection of glaucoma and other disorders of the eye.  Dental Screening: Recommended annual dental exams for proper oral hygiene.  Community Resource Referral / Chronic Care Management: CRR required this visit?  No   CCM required this visit?  No      Plan:     I have personally reviewed and noted the following in the patient's chart:   Medical and social history Use of alcohol, tobacco or illicit drugs  Current medications and supplements including opioid prescriptions. Patient is not currently taking opioid prescriptions. Functional ability and status Nutritional status Physical activity Advanced directives List of other physicians Hospitalizations, surgeries, and ER visits in previous 12 months Vitals Screenings to include cognitive, depression, and falls Referrals and appointments  In addition, I have reviewed and discussed with patient certain preventive protocols, quality metrics, and best practice recommendations. A written personalized care plan for preventive services as well as general preventive health recommendations were provided to patient.     DLeta Jungling LPN   116/05/958

## 2022-04-05 NOTE — Patient Instructions (Addendum)
Megan Harrison , Thank you for taking time to come for your Medicare Wellness Visit. I appreciate your ongoing commitment to your health goals. Please review the following plan we discussed and let me know if I can assist you in the future.   These are the goals we discussed:  Goals      Maintain healthy lifestyle     Stay active Healthy diet Good water intake        This is a list of the screening recommended for you and due dates:  Health Maintenance  Topic Date Due   COVID-19 Vaccine (3 - Pfizer risk series) 04/21/2022*   Zoster (Shingles) Vaccine (2 of 2) 07/05/2022*   Flu Shot  08/04/2022*   Hepatitis C Screening: USPSTF Recommendation to screen - Ages 18-79 yo.  06/08/2023*   Medicare Annual Wellness Visit  04/06/2023   Mammogram  09/26/2023   Colon Cancer Screening  10/21/2023   DTaP/Tdap/Td vaccine (3 - Td or Tdap) 11/03/2024   Pneumonia Vaccine  Completed   DEXA scan (bone density measurement)  Completed   HPV Vaccine  Aged Out  *Topic was postponed. The date shown is not the original due date.    Advanced directives: End of life planning; Advance aging; Advanced directives discussed.  Copy of current HCPOA/Living Will requested.    Conditions/risks identified: none new.  Next appointment: Follow up in one year for your annual wellness visit    Preventive Care 65 Years and Older, Female Preventive care refers to lifestyle choices and visits with your health care provider that can promote health and wellness. What does preventive care include? A yearly physical exam. This is also called an annual well check. Dental exams once or twice a year. Routine eye exams. Ask your health care provider how often you should have your eyes checked. Personal lifestyle choices, including: Daily care of your teeth and gums. Regular physical activity. Eating a healthy diet. Avoiding tobacco and drug use. Limiting alcohol use. Practicing safe sex. Taking low-dose aspirin every  day. Taking vitamin and mineral supplements as recommended by your health care provider. What happens during an annual well check? The services and screenings done by your health care provider during your annual well check will depend on your age, overall health, lifestyle risk factors, and family history of disease. Counseling  Your health care provider may ask you questions about your: Alcohol use. Tobacco use. Drug use. Emotional well-being. Home and relationship well-being. Sexual activity. Eating habits. History of falls. Memory and ability to understand (cognition). Work and work Statistician. Reproductive health. Screening  You may have the following tests or measurements: Height, weight, and BMI. Blood pressure. Lipid and cholesterol levels. These may be checked every 5 years, or more frequently if you are over 62 years old. Skin check. Lung cancer screening. You may have this screening every year starting at age 4 if you have a 30-pack-year history of smoking and currently smoke or have quit within the past 15 years. Fecal occult blood test (FOBT) of the stool. You may have this test every year starting at age 73. Flexible sigmoidoscopy or colonoscopy. You may have a sigmoidoscopy every 5 years or a colonoscopy every 10 years starting at age 17. Hepatitis C blood test. Hepatitis B blood test. Sexually transmitted disease (STD) testing. Diabetes screening. This is done by checking your blood sugar (glucose) after you have not eaten for a while (fasting). You may have this done every 1-3 years. Bone density scan. This is  done to screen for osteoporosis. You may have this done starting at age 6. Mammogram. This may be done every 1-2 years. Talk to your health care provider about how often you should have regular mammograms. Talk with your health care provider about your test results, treatment options, and if necessary, the need for more tests. Vaccines  Your health care  provider may recommend certain vaccines, such as: Influenza vaccine. This is recommended every year. Tetanus, diphtheria, and acellular pertussis (Tdap, Td) vaccine. You may need a Td booster every 10 years. Zoster vaccine. You may need this after age 72. Pneumococcal 13-valent conjugate (PCV13) vaccine. One dose is recommended after age 80. Pneumococcal polysaccharide (PPSV23) vaccine. One dose is recommended after age 44. Talk to your health care provider about which screenings and vaccines you need and how often you need them. This information is not intended to replace advice given to you by your health care provider. Make sure you discuss any questions you have with your health care provider. Document Released: 05/19/2015 Document Revised: 01/10/2016 Document Reviewed: 02/21/2015 Elsevier Interactive Patient Education  2017 [place] Prevention in the Home Falls can cause injuries. They can happen to people of all ages. There are many things you can do to make your home safe and to help prevent falls. What can I do on the outside of my home? Regularly fix the edges of walkways and driveways and fix any cracks. Remove anything that might make you trip as you walk through a door, such as a raised step or threshold. Trim any bushes or trees on the path to your home. Use bright outdoor lighting. Clear any walking paths of anything that might make someone trip, such as rocks or tools. Regularly check to see if handrails are loose or broken. Make sure that both sides of any steps have handrails. Any raised decks and porches should have guardrails on the edges. Have any leaves, snow, or ice cleared regularly. Use sand or salt on walking paths during winter. Clean up any spills in your garage right away. This includes oil or grease spills. What can I do in the bathroom? Use night lights. Install grab bars by the toilet and in the tub and shower. Do not use towel bars as grab  bars. Use non-skid mats or decals in the tub or shower. If you need to sit down in the shower, use a plastic, non-slip stool. Keep the floor dry. Clean up any water that spills on the floor as soon as it happens. Remove soap buildup in the tub or shower regularly. Attach bath mats securely with double-sided non-slip rug tape. Do not have throw rugs and other things on the floor that can make you trip. What can I do in the bedroom? Use night lights. Make sure that you have a light by your bed that is easy to reach. Do not use any sheets or blankets that are too big for your bed. They should not hang down onto the floor. Have a firm chair that has side arms. You can use this for support while you get dressed. Do not have throw rugs and other things on the floor that can make you trip. What can I do in the kitchen? Clean up any spills right away. Avoid walking on wet floors. Keep items that you use a lot in easy-to-reach places. If you need to reach something above you, use a strong step stool that has a grab bar. Keep electrical cords out of  the way. Do not use floor polish or wax that makes floors slippery. If you must use wax, use non-skid floor wax. Do not have throw rugs and other things on the floor that can make you trip. What can I do with my stairs? Do not leave any items on the stairs. Make sure that there are handrails on both sides of the stairs and use them. Fix handrails that are broken or loose. Make sure that handrails are as long as the stairways. Check any carpeting to make sure that it is firmly attached to the stairs. Fix any carpet that is loose or worn. Avoid having throw rugs at the top or bottom of the stairs. If you do have throw rugs, attach them to the floor with carpet tape. Make sure that you have a light switch at the top of the stairs and the bottom of the stairs. If you do not have them, ask someone to add them for you. What else can I do to help prevent  falls? Wear shoes that: Do not have high heels. Have rubber bottoms. Are comfortable and fit you well. Are closed at the toe. Do not wear sandals. If you use a stepladder: Make sure that it is fully opened. Do not climb a closed stepladder. Make sure that both sides of the stepladder are locked into place. Ask someone to hold it for you, if possible. Clearly mark and make sure that you can see: Any grab bars or handrails. First and last steps. Where the edge of each step is. Use tools that help you move around (mobility aids) if they are needed. These include: Canes. Walkers. Scooters. Crutches. Turn on the lights when you go into a dark area. Replace any light bulbs as soon as they burn out. Set up your furniture so you have a clear path. Avoid moving your furniture around. If any of your floors are uneven, fix them. If there are any pets around you, be aware of where they are. Review your medicines with your doctor. Some medicines can make you feel dizzy. This can increase your chance of falling. Ask your doctor what other things that you can do to help prevent falls. This information is not intended to replace advice given to you by your health care provider. Make sure you discuss any questions you have with your health care provider. Document Released: 02/16/2009 Document Revised: 09/28/2015 Document Reviewed: 05/27/2014 Elsevier Interactive Patient Education  2017 [employer].

## 2022-04-06 NOTE — Progress Notes (Signed)
Subjective:    Patient ID: Megan Harrison, female    DOB: 25-May-1955, 66 y.o.   MRN: [id number] Chief Complaint  Patient presents with   Follow-up    [facility] 3 week follow up    Patient returns today in follow up of her right upper extremity DVT with a right subclavian Port-A-Cath in place.  On 02/28/2022 the patient underwent removal of her Port-A-Cath as well as thrombectomy of her right subclavian and innominate veins.  The swelling has decreased although is not completely resolved.  She continues to tolerate anticoagulation well.  The incision from the Port-A-Cath does not show any signs of infection and is healing well.      Review of Systems  Skin:  Positive for wound.  All other systems reviewed and are negative.      Objective:   Physical Exam Vitals reviewed.  HENT:     Head: Normocephalic.  Cardiovascular:     Rate and Rhythm: Normal rate.     Pulses:          Radial pulses are 2+ on the right side.  Pulmonary:     Effort: Pulmonary effort is normal.  Musculoskeletal:     Comments: Right upper extremity edema  Skin:    General: Skin is warm and dry.  Neurological:     Mental Status: She is alert and oriented to person, place, and time.  Psychiatric:        Mood and Affect: Mood normal.        Behavior: Behavior normal.        Thought Content: Thought content normal.        Judgment: Judgment normal.     BP (!) 147/81 (BP Location: Right Arm)   Pulse 69   Resp 16   Wt 189 lb (85.7 kg)   BMI 32.95 kg/m   Past Medical History:  Diagnosis Date   Anemia    Cutaneous sarcoidosis    Degenerative disc disease    Diverticulosis    Dysplastic nevus 11/19/2017   L mid back 6.0 cm lat to spine   Foot pain    GERD (gastroesophageal reflux disease)    Hyperlipidemia    Malignant neoplasm of upper-outer quadrant of left breast in female, estrogen receptor negative ([place]) 11/04/2021   Migraine    Shingles    eyes    Social History   Socioeconomic  History   Marital status: Married    Spouse name: Not on file   Number of children: Not on file   Years of education: Not on file   Highest education level: Not on file  Occupational History   Occupation: Works for [employer]    Employer: RETIRED  Tobacco Use   Smoking status: Never   Smokeless tobacco: Never  Vaping Use   Vaping Use: Never used  Substance and Sexual Activity   Alcohol use: No    Alcohol/week: 0.0 standard drinks of alcohol   Drug use: No   Sexual activity: Yes  Other Topics Concern   Not on file  Social History Narrative   Regular exercise:  Curves, elliptical, walking.   Social Determinants of Health   Financial Resource Strain: Low Risk  (04/05/2022)   Overall Financial Resource Strain (CARDIA)    Difficulty of Paying Living Expenses: Not hard at all  Food Insecurity: No Food Insecurity (04/05/2022)   Hunger Vital Sign    Worried About Running Out of Food in the Last Year: Never true  Ran Out of Food in the Last Year: Never true  Transportation Needs: No Transportation Needs (04/05/2022)   PRAPARE - Hydrologist (Medical): No    Lack of Transportation (Non-Medical): No  Physical Activity: Insufficiently Active (02/01/2020)   Exercise Vital Sign    Days of Exercise per Week: 4 days    Minutes of Exercise per Session: 30 min  Stress: No Stress Concern Present (04/05/2022)   [place]    Feeling of Stress : Not at all  Social Connections: [place] (04/05/2022)   Social Connection and Isolation Panel [NHANES]    Frequency of Communication with Friends and Family: More than three times a week    Frequency of Social Gatherings with Friends and Family: More than three times a week    Attends Religious Services: More than 4 times per year    Active Member of [employer] or Organizations: Yes    Attends Archivist Meetings: More than 4 times per  year    Marital Status: Married  Human resources officer Violence: Not At Risk (04/05/2022)   Humiliation, Afraid, Rape, and Kick questionnaire    Fear of Current or Ex-Partner: No    Emotionally Abused: No    Physically Abused: No    Sexually Abused: No    Past Surgical History:  Procedure Laterality Date   2D Echo  1999   Negative   ABDOMINAL HYSTERECTOMY  03/1998   APPENDECTOMY     Arm lesion, cutaneous sarcoid  12/2005   with normal CXR and ANA   BREAST BIOPSY Left 10/23/2021   US biopsy/ heart clip/ [facility]   BREAST LUMPECTOMY WITH RADIOFREQUENCY TAG IDENTIFICATION Left 66/10/3014   lt br rf tag placement   BREAST LUMPECTOMY,RADIO FREQ [place] Left 11/20/2021   Procedure: BREAST LUMPECTOMY,RADIO FREQ LOCALIZER,AXILLARY SENTINEL LYMPH NODE BIOPSY;  Surgeon: Olean Ree, MD;  Location: [facility];  Service: General;  Laterality: Left;   CESAREAN SECTION     x2   COLONOSCOPY  11/0/2009,08/18/13   diverticulosis and hemorrhoids   DIAGNOSTIC LAPAROSCOPY     EYE SURGERY  10/13/2018   had shingles in left eye, had to have surgery, has contact lens in now.   FOOT NEUROMA SURGERY Left    LAPAROSCOPIC APPENDECTOMY N/A 02/18/2016   Procedure: APPENDECTOMY LAPAROSCOPIC;  Surgeon: Ralene Ok, MD;  Location: [place];  Service: General;  Laterality: N/A;   LUMBAR LAMINECTOMY  10/2017   PERIPHERAL VASCULAR THROMBECTOMY Right 01/16/2022   Procedure: PERIPHERAL VASCULAR THROMBECTOMY;  Surgeon: Algernon Huxley, MD;  Location: [place] [facility];  Service: Cardiovascular;  Laterality: Right;   PORTA CATH REMOVAL N/A 02/28/2022   Procedure: PORTA CATH REMOVAL;  Surgeon: Algernon Huxley, MD;  Location: [place] [facility];  Service: Cardiovascular;  Laterality: N/A;   PORTACATH PLACEMENT Right 12/05/2021   Procedure: INSERTION PORT-A-CATH;  Surgeon: Olean Ree, MD;  Location: [facility];  Service: General;  Laterality: Right;   ROTATOR CUFF REPAIR Left    TONSILLECTOMY      UPPER GASTROINTESTINAL ENDOSCOPY  11/15/2010   non-erosive gastritis, H. pylori negative    Family History  Problem Relation Age of Onset   Colon cancer Brother        early 35's   Stomach cancer Brother    Heart disease Mother        CAD   Alcohol abuse Mother    Lung cancer Father  Heart disease Brother        CAD   Throat cancer Brother    Heart disease Brother        CAD, pre diabetic, obese   Colon polyps Sister    Breast cancer Neg Hx    Rectal cancer Neg Hx     Allergies  Allergen Reactions   Codeine     REACTION: syncope   Sumatriptan     REACTION: choking sensation       Latest Ref Rng & Units 04/01/2022    9:48 AM 03/13/2022    2:25 PM 02/13/2022    9:00 AM  CBC  WBC 4.0 - 10.5 K/uL 4.4  6.5  10.7   Hemoglobin 12.0 - 15.0 g/dL 11.4  9.9  9.4   Hematocrit 36.0 - 46.0 % 35.3  31.3  27.7   Platelets 150 - 400 K/uL 270  435  415       CMP     Component Value Date/Time   NA 136 04/01/2022 0948   K 3.7 04/01/2022 0948   CL 108 04/01/2022 0948   CO2 23 04/01/2022 0948   GLUCOSE 121 (H) 04/01/2022 0948   BUN 18 04/01/2022 0948   CREATININE 0.60 04/01/2022 0948   CALCIUM 9.1 04/01/2022 0948   PROT 6.8 04/01/2022 0948   ALBUMIN 3.9 04/01/2022 0948   AST 21 04/01/2022 0948   ALT 14 04/01/2022 0948   ALKPHOS 56 04/01/2022 0948   BILITOT 0.3 04/01/2022 0948   GFRNONAA >60 04/01/2022 0948   GFRAA >60 02/19/2016 0424     No results found.     Assessment & Plan:   1. Acute deep vein thrombosis (DVT) of axillary vein of right upper extremity [facility]) Patient is doing well following intervention.  The incision site is healing well.  Patient will continue with anticoagulation and wound being managed by her oncologist.  Patient will follow-up with [place] on an as-needed basis.   Current Outpatient Medications on File Prior to Visit  Medication Sig Dispense Refill   acetaminophen (TYLENOL) 500 MG tablet Take 2 tablets (1,000 mg total) by mouth every 6  (six) hours as needed for mild pain.     apixaban (ELIQUIS) 5 MG TABS tablet Take 1 tablet (5 mg total) by mouth 2 (two) times daily. 60 tablet 2   calcium carbonate (TUMS - DOSED IN MG ELEMENTAL CALCIUM) 500 MG chewable tablet Chew 1 tablet by mouth as needed for indigestion or heartburn.     Cholecalciferol (VITAMIN D3) 50 MCG (2000 UT) TABS Take 1 tablet by mouth daily.     ferrous sulfate 325 (65 FE) MG tablet Take 325 mg by mouth daily.     gabapentin (NEURONTIN) 300 MG capsule Take 300 mg by mouth daily after supper.     Magnesium Hydroxide (DULCOLAX PO) Take 1 tablet by mouth 2 (two) times daily.     meloxicam (MOBIC) 15 MG tablet Take 15 mg by mouth daily.     Multiple Vitamins-Minerals (ONE-A-DAY WOMENS 50 PLUS PO) Take 1 tablet by mouth daily.     pantoprazole (PROTONIX) 20 MG tablet Take 1 tablet (20 mg total) by mouth daily. 30 tablet 2   prednisoLONE acetate (PRED FORTE) 1 % ophthalmic suspension Place 1 drop into the left eye every other day.     Timolol Maleate 0.5 % (DAILY) SOLN Place 1 drop into the left eye daily at 6 (six) AM.     [DISCONTINUED] prochlorperazine (COMPAZINE) 10 MG tablet Take  1 tablet (10 mg total) by mouth every 6 (six) hours as needed (Nausea or vomiting). (Patient not taking: Reported on 12/12/2021) 30 tablet 1   Current Facility-Administered Medications on File Prior to Visit  Medication Dose Route Frequency Provider Last Rate Last Admin   heparin lock flush 100 UNIT/ML injection            palonosetron (ALOXI) 0.25 MG/5ML injection             There are no Patient Instructions on file for this visit. No follow-ups on file.   Kris Hartmann, NP

## 2022-04-07 ENCOUNTER — Encounter (INDEPENDENT_AMBULATORY_CARE_PROVIDER_SITE_OTHER): Payer: Self-pay | Admitting: Nurse Practitioner

## 2022-04-11 ENCOUNTER — Ambulatory Visit (INDEPENDENT_AMBULATORY_CARE_PROVIDER_SITE_OTHER): Payer: Medicare PPO | Admitting: Family Medicine

## 2022-04-11 ENCOUNTER — Encounter: Payer: Self-pay | Admitting: Family Medicine

## 2022-04-11 VITALS — BP 138/75 | HR 75 | Temp 97.3°F | Ht 63.0 in | Wt 177.4 lb

## 2022-04-11 DIAGNOSIS — Z0000 Encounter for general adult medical examination without abnormal findings: Secondary | ICD-10-CM

## 2022-04-11 DIAGNOSIS — Z23 Encounter for immunization: Secondary | ICD-10-CM | POA: Diagnosis not present

## 2022-04-11 DIAGNOSIS — Z171 Estrogen receptor negative status [ER-]: Secondary | ICD-10-CM | POA: Diagnosis not present

## 2022-04-11 DIAGNOSIS — D3613 Benign neoplasm of peripheral nerves and autonomic nervous system of lower limb, including hip: Secondary | ICD-10-CM

## 2022-04-11 DIAGNOSIS — E7800 Pure hypercholesterolemia, unspecified: Secondary | ICD-10-CM

## 2022-04-11 DIAGNOSIS — C50412 Malignant neoplasm of upper-outer quadrant of left female breast: Secondary | ICD-10-CM | POA: Diagnosis not present

## 2022-04-11 DIAGNOSIS — Z800 Family history of malignant neoplasm of digestive organs: Secondary | ICD-10-CM | POA: Diagnosis not present

## 2022-04-11 DIAGNOSIS — M85859 Other specified disorders of bone density and structure, unspecified thigh: Secondary | ICD-10-CM | POA: Diagnosis not present

## 2022-04-11 LAB — COMPREHENSIVE METABOLIC PANEL
ALT: 11 U/L (ref 0–35)
AST: 16 U/L (ref 0–37)
Albumin: 4.2 g/dL (ref 3.5–5.2)
Alkaline Phosphatase: 60 U/L (ref 39–117)
BUN: 15 mg/dL (ref 6–23)
CO2: 29 mEq/L (ref 19–32)
Calcium: 9.1 mg/dL (ref 8.4–10.5)
Chloride: 107 mEq/L (ref 96–112)
Creatinine, Ser: 0.6 mg/dL (ref 0.40–1.20)
GFR: 92.19 mL/min (ref >60.00)
Glucose, Bld: 95 mg/dL (ref 70–99)
Potassium: 4.1 mEq/L (ref 3.5–5.1)
Sodium: 143 mEq/L (ref 135–145)
Total Bilirubin: 0.3 mg/dL (ref 0.2–1.2)
Total Protein: 6.3 g/dL (ref 6.0–8.3)

## 2022-04-11 LAB — CBC WITH DIFFERENTIAL/PLATELET
Basophils Absolute: 0 10*3/uL (ref 0.0–0.1)
Basophils Relative: 0.7 % (ref 0.0–3.0)
Eosinophils Absolute: 0.5 10*3/uL (ref 0.0–0.7)
Eosinophils Relative: 9.2 % — ABNORMAL HIGH (ref 0.0–5.0)
HCT: 33.5 % — ABNORMAL LOW (ref 36.0–46.0)
Hemoglobin: 11.5 g/dL — ABNORMAL LOW (ref 12.0–15.0)
Lymphocytes Relative: 13.2 % (ref 12.0–46.0)
Lymphs Abs: 0.7 10*3/uL (ref 0.7–4.0)
MCHC: 34.3 g/dL (ref 30.0–36.0)
MCV: 96.3 fl (ref 78.0–100.0)
Monocytes Absolute: 0.7 10*3/uL (ref 0.1–1.0)
Monocytes Relative: 13.5 % — ABNORMAL HIGH (ref 3.0–12.0)
Neutro Abs: 3.2 10*3/uL (ref 1.4–7.7)
Neutrophils Relative %: 63.4 % (ref 43.0–77.0)
Platelets: 314 10*3/uL (ref 150.0–400.0)
RBC: 3.48 Mil/uL — ABNORMAL LOW (ref 3.87–5.11)
RDW: 14.1 % (ref 11.5–15.5)
WBC: 5.1 10*3/uL (ref 4.0–10.5)

## 2022-04-11 LAB — LIPID PANEL
Cholesterol: 161 mg/dL (ref 0–200)
HDL: 64.5 mg/dL (ref >39.00)
LDL Cholesterol: 79 mg/dL (ref 0–99)
NonHDL: 96.43
Total CHOL/HDL Ratio: 2
Triglycerides: 89 mg/dL (ref 0.0–149.0)
VLDL: 17.8 mg/dL (ref 0.0–40.0)

## 2022-04-11 LAB — TSH: TSH: 1.59 u[IU]/mL (ref 0.35–5.50)

## 2022-04-11 NOTE — Assessment & Plan Note (Signed)
Reviewed onc notes Done with chemo and rad (difficult)  Also blood clot assoc with port - almost done with eliquis  Had lumpectomy  Will get mammo in fall   Getting her strength back

## 2022-04-11 NOTE — Assessment & Plan Note (Signed)
Left foot  Surgery did not help with podiatry   She may consider seeing ortho in the future

## 2022-04-11 NOTE — Assessment & Plan Note (Signed)
Reviewed health habits including diet and exercise and skin cancer prevention Reviewed appropriate screening tests for age  Also reviewed health mt list, fam hx and immunization status , as well as social and family history   See HPI Labs ordered Flu shot given  Mammogram due in spring s/p tx of breast cancer  Colonoscopy utd 10/2018 with 5 y recall  Dexa utd 05/2021   no falls or fx and takes vitD

## 2022-04-11 NOTE — Progress Notes (Signed)
Subjective:    Patient ID: Megan Harrison, female    DOB: [birth year], 66 y.o.   MRN: [id number]  HPI Here for health maintenance exam and to review chronic medical problems    Wt Readings from Last 3 Encounters:  04/11/22 177 lb 6 oz (80.5 kg)  04/05/22 183 lb (83 kg)  04/01/22 183 lb 3.2 oz (83.1 kg)   31.42 kg/m  Doing ok  Finished breast cancer tx  It was tough  Lots of side eff  Starting to get her energy back   Appetite is ok/ but taste is off   Immunization History  Administered Date(s) Administered   Fluad Quad(high Dose 65+) 01/18/2019, 02/08/2020   Influenza Split 02/19/2011, 01/16/2021   Influenza Whole 04/09/2006, 02/13/2008, 01/19/2009, 02/02/2010   Influenza,inj,Quad PF,6+ Mos 01/02/2017, 01/04/2018   Influenza-Unspecified 01/05/2012, 01/13/2013, 02/04/2015, 01/09/2016   PFIZER(Purple Top)SARS-COV-2 Vaccination 07/09/2019, 07/30/2019   Pneumococcal Conjugate-13 01/18/2019   Pneumococcal Polysaccharide-23 02/08/2020   Td 08/30/2004   Tdap 11/04/2014   Zoster Recombinat (Shingrix) 05/21/2019   Zoster, Live 11/04/2013   There are no preventive care reminders to display for this patient.  Flu shot -today   Mammogram 09/2021- will get that set up in spring  Dx with breast cancer - chemo and rad and lumpectomy  Final tx on 11/29  Self breast exam Had a blood clot rel to her port placement  -now taking eliquis and finishing 3 mo course   Genetic testing was offered/ she declined    Still has swelling in legs  Getting fitted for comp sleeve for the R arm  Getting better   Colonoscopy 10/2018 with 5 y recall Brother had colon cancer in his 33s   Dexa  05/2021 osteopenia  Falls: none Fractures: none  Supplements vitamin D 3  Exercise : not ready   Had foot surgery for neuroma in feb -it did not work  Numb L foot and pain to walk on it  Would consider ortho foot doctor    BP Readings from Last 3 Encounters:  04/11/22 138/75  04/01/22 (!) 147/84   03/21/22 (!) 147/81    Pulse Readings from Last 3 Encounters:  04/11/22 75  04/01/22 65  03/21/22 69     Hyperlipidemia  Lab Results  Component Value Date   CHOL 176 02/15/2021   HDL 75.30 02/15/2021   LDLCALC 87 02/15/2021   LDLDIRECT 102.9 06/10/2011   TRIG 69.0 02/15/2021   CHOLHDL 2 02/15/2021   Atorvastatin 10 mg daily   Taking protonix -will come off of this soon   Patient Active Problem List   Diagnosis Date Noted   Neuroma of foot 04/11/2022   Acute deep vein thrombosis (DVT) of axillary vein of right upper extremity ([place]) 01/15/2022   Goals of care, counseling/discussion 11/30/2021   Malignant neoplasm of upper-outer quadrant of left breast in female, estrogen receptor negative ([place]) 11/04/2021   Medicare annual wellness visit, subsequent 02/15/2021   Osteopenia 01/31/2019   Estrogen deficiency 01/18/2019   Welcome to Medicare preventive visit 01/11/2019   Family history of colon cancer - brother in early 60's 10/21/2018   Encounter for screening mammogram for breast cancer 05/19/2017   Hyperlipidemia 12/10/2016   ETD (eustachian tube dysfunction) 11/27/2015   Encounter for routine gynecological examination 11/04/2014   Hemorrhoids, internal 04/02/2013   Routine general medical examination at a health care facility 06/09/2011   ALLERGIC RHINITIS 03/10/2009   History of shingles 08/28/2006   Past Medical History:  Diagnosis Date  Anemia    Cutaneous sarcoidosis    Degenerative disc disease    Diverticulosis    Dysplastic nevus 11/19/2017   L mid back 6.0 cm lat to spine   Foot pain    GERD (gastroesophageal reflux disease)    Hyperlipidemia    Malignant neoplasm of upper-outer quadrant of left breast in female, estrogen receptor negative ([place]) 11/04/2021   Migraine    Shingles    eyes   Past Surgical History:  Procedure Laterality Date   2D Echo  1999   Negative   ABDOMINAL HYSTERECTOMY  03/1998   APPENDECTOMY     Arm lesion, cutaneous  sarcoid  12/2005   with normal CXR and ANA   BREAST BIOPSY Left 10/23/2021   US biopsy/ heart clip/ [facility]   BREAST LUMPECTOMY WITH RADIOFREQUENCY TAG IDENTIFICATION Left 33/54/5625   lt br rf tag placement   BREAST LUMPECTOMY,RADIO FREQ [place] BIOPSY Left 11/20/2021   Procedure: BREAST LUMPECTOMY,RADIO FREQ LOCALIZER,AXILLARY SENTINEL LYMPH NODE BIOPSY;  Surgeon: Olean Ree, MD;  Location: [facility];  Service: General;  Laterality: Left;   CESAREAN SECTION     x2   COLONOSCOPY  11/0/2009,08/18/13   diverticulosis and hemorrhoids   DIAGNOSTIC LAPAROSCOPY     EYE SURGERY  10/13/2018   had shingles in left eye, had to have surgery, has contact lens in now.   FOOT NEUROMA SURGERY Left    LAPAROSCOPIC APPENDECTOMY N/A 02/18/2016   Procedure: APPENDECTOMY LAPAROSCOPIC;  Surgeon: Ralene Ok, MD;  Location: [place];  Service: General;  Laterality: N/A;   LUMBAR LAMINECTOMY  10/2017   PERIPHERAL VASCULAR THROMBECTOMY Right 01/16/2022   Procedure: PERIPHERAL VASCULAR THROMBECTOMY;  Surgeon: Algernon Huxley, MD;  Location: [place] [facility];  Service: Cardiovascular;  Laterality: Right;   PORTA CATH REMOVAL N/A 02/28/2022   Procedure: PORTA CATH REMOVAL;  Surgeon: Algernon Huxley, MD;  Location: [place] [facility];  Service: Cardiovascular;  Laterality: N/A;   PORTACATH PLACEMENT Right 12/05/2021   Procedure: INSERTION PORT-A-CATH;  Surgeon: Olean Ree, MD;  Location: [facility];  Service: General;  Laterality: Right;   ROTATOR CUFF REPAIR Left    TONSILLECTOMY     UPPER GASTROINTESTINAL ENDOSCOPY  11/15/2010   non-erosive gastritis, H. pylori negative   Social History   Tobacco Use   Smoking status: Never   Smokeless tobacco: Never  Vaping Use   Vaping Use: Never used  Substance Use Topics   Alcohol use: No    Alcohol/week: 0.0 standard drinks of alcohol   Drug use: No   Family History  Problem Relation Age of Onset   Colon cancer Brother        early  64's   Stomach cancer Brother    Heart disease Mother        CAD   Alcohol abuse Mother    Lung cancer Father    Heart disease Brother        CAD   Throat cancer Brother    Heart disease Brother        CAD, pre diabetic, obese   Colon polyps Sister    Breast cancer Neg Hx    Rectal cancer Neg Hx    Allergies  Allergen Reactions   Codeine     REACTION: syncope   Sumatriptan     REACTION: choking sensation   Current Outpatient Medications on File Prior to Visit  Medication Sig Dispense Refill   acetaminophen (TYLENOL) 500 MG tablet Take 2 tablets (  1,000 mg total) by mouth every 6 (six) hours as needed for mild pain.     apixaban (ELIQUIS) 5 MG TABS tablet Take 1 tablet (5 mg total) by mouth 2 (two) times daily. 60 tablet 2   atorvastatin (LIPITOR) 10 MG tablet Take 1 tablet by mouth once daily 90 tablet 0   calcium carbonate (TUMS - DOSED IN MG ELEMENTAL CALCIUM) 500 MG chewable tablet Chew 1 tablet by mouth as needed for indigestion or heartburn.     Cholecalciferol (VITAMIN D3) 50 MCG (2000 UT) TABS Take 1 tablet by mouth daily.     ferrous sulfate 325 (65 FE) MG tablet Take 325 mg by mouth daily.     fluticasone (FLONASE) 50 MCG/ACT nasal spray USE 1 SPRAY(S) IN EACH NOSTRIL ONCE DAILY AS DIRECTED 16 g 0   gabapentin (NEURONTIN) 300 MG capsule Take 300 mg by mouth daily after supper.     Magnesium Hydroxide (DULCOLAX PO) Take 1 tablet by mouth 2 (two) times daily.     meloxicam (MOBIC) 15 MG tablet Take 15 mg by mouth daily.     Multiple Vitamins-Minerals (ONE-A-DAY WOMENS 50 PLUS PO) Take 1 tablet by mouth daily.     pantoprazole (PROTONIX) 20 MG tablet Take 1 tablet (20 mg total) by mouth daily. 30 tablet 2   prednisoLONE acetate (PRED FORTE) 1 % ophthalmic suspension Place 1 drop into the left eye every other day.     Timolol Maleate 0.5 % (DAILY) SOLN Place 1 drop into the left eye daily at 6 (six) AM.     Current Facility-Administered Medications on File Prior to Visit   Medication Dose Route Frequency Provider Last Rate Last Admin   heparin lock flush 100 UNIT/ML injection            palonosetron (ALOXI) 0.25 MG/5ML injection              Review of Systems  Constitutional:  Positive for fatigue. Negative for activity change, appetite change, fever and unexpected weight change.  HENT:  Negative for congestion, ear pain, rhinorrhea, sinus pressure and sore throat.   Eyes:  Negative for pain, redness and visual disturbance.  Respiratory:  Negative for cough, shortness of breath and wheezing.   Cardiovascular:  Negative for chest pain and palpitations.  Gastrointestinal:  Negative for abdominal pain, blood in stool, constipation and diarrhea.  Endocrine: Negative for polydipsia and polyuria.  Genitourinary:  Negative for dysuria, frequency and urgency.  Musculoskeletal:  Negative for arthralgias, back pain and myalgias.       Left foot pain from neuroma   Skin:  Negative for pallor and rash.  Allergic/Immunologic: Negative for environmental allergies.  Neurological:  Negative for dizziness, syncope and headaches.  Hematological:  Negative for adenopathy. Does not bruise/bleed easily.  Psychiatric/Behavioral:  Negative for decreased concentration and dysphoric mood. The patient is not nervous/anxious.        Objective:   Physical Exam Constitutional:      General: She is not in acute distress.    Appearance: Normal appearance. She is well-developed. She is obese. She is not ill-appearing or diaphoretic.  HENT:     Head: Normocephalic and atraumatic.     Right Ear: Tympanic membrane, ear canal and external ear normal.     Left Ear: Tympanic membrane, ear canal and external ear normal.     Nose: Nose normal. No congestion.     Mouth/Throat:     Mouth: Mucous membranes are moist.  Pharynx: Oropharynx is clear. No posterior oropharyngeal erythema.  Eyes:     General: No scleral icterus.    Extraocular Movements: Extraocular movements intact.      Conjunctiva/sclera: Conjunctivae normal.     Pupils: Pupils are equal, round, and reactive to light.  Neck:     Thyroid: No thyromegaly.     Vascular: No carotid bruit or JVD.  Cardiovascular:     Rate and Rhythm: Normal rate and regular rhythm.     Pulses: Normal pulses.     Heart sounds: Normal heart sounds.     No gallop.  Pulmonary:     Effort: Pulmonary effort is normal. No respiratory distress.     Breath sounds: Normal breath sounds. No wheezing.     Comments: Good air exch Chest:     Chest wall: No tenderness.  Abdominal:     General: Bowel sounds are normal. There is no distension or abdominal bruit.     Palpations: Abdomen is soft. There is no mass.     Tenderness: There is no abdominal tenderness.     Hernia: No hernia is present.  Genitourinary:    Comments: Breast exam done by gyn provider  Musculoskeletal:        General: No tenderness. Normal range of motion.     Cervical back: Normal range of motion and neck supple. No rigidity. No muscular tenderness.     Right lower leg: No edema.     Left lower leg: No edema.     Comments: No kyphosis   Lymphadenopathy:     Cervical: No cervical adenopathy.  Skin:    General: Skin is warm and dry.     Coloration: Skin is not pale.     Findings: No erythema or rash.     Comments: Solar lentigines diffusely  Hair loss noted from chemo  Neurological:     Mental Status: She is alert. Mental status is at baseline.     Cranial Nerves: No cranial nerve deficit.     Motor: No abnormal muscle tone.     Coordination: Coordination normal.     Gait: Gait normal.     Deep Tendon Reflexes: Reflexes are normal and symmetric. Reflexes normal.  Psychiatric:        Mood and Affect: Mood normal.        Cognition and Memory: Cognition and memory normal.           Assessment & Plan:   Problem List Items Addressed This Visit       Musculoskeletal and Integument   Osteopenia    Dexa utd 05/2021 No falls or fractures  Taking  vit D  Disc plan for exercise when recovered from b ca tx        Other   Family history of colon cancer - brother in early 40's    Colonoscopy utd 10/2018 with 5 y recall      Hyperlipidemia    .Disc goals for lipids and reasons to control them Rev last labs with pt Rev low sat fat diet in detail Labs ordered       Relevant Orders   TSH   Lipid panel   Comprehensive metabolic panel   Malignant neoplasm of upper-outer quadrant of left breast in female, estrogen receptor negative ([place])    Reviewed onc notes Done with chemo and rad (difficult)  Also blood clot assoc with port - almost done with eliquis  Had lumpectomy  Will get mammo in fall  Getting her strength back        Relevant Orders   Lipid panel   CBC with Differential/Platelet   Neuroma of foot    Left foot  Surgery did not help with podiatry   She may consider seeing ortho in the future       Routine general medical examination at a health care facility - Primary    Reviewed health habits including diet and exercise and skin cancer prevention Reviewed appropriate screening tests for age  Also reviewed health mt list, fam hx and immunization status , as well as social and family history   See HPI Labs ordered Flu shot given  Mammogram due in spring s/p tx of breast cancer  Colonoscopy utd 10/2018 with 5 y recall  Dexa utd 05/2021   no falls or fx and takes vitD        Relevant Orders   Flu Vaccine QUAD High Dose(Fluad) (Completed)   Other Visit Diagnoses     Need for influenza vaccination       Relevant Orders   Flu Vaccine QUAD High Dose(Fluad) (Completed)

## 2022-04-11 NOTE — Patient Instructions (Addendum)
Let me know if you want a referral to an orthopedic doctor for foot   Take care of yourself   Start to think about exercise that is easy on your feet  Swimming , bike , chair yoga Incorporate some strength training (weights/ bands)    Lab today  Flu shot today

## 2022-04-11 NOTE — Assessment & Plan Note (Signed)
Dexa utd 05/2021 No falls or fractures  Taking vit D  Disc plan for exercise when recovered from b ca tx

## 2022-04-11 NOTE — Assessment & Plan Note (Signed)
Colonoscopy utd 10/2018 with 5 y recall

## 2022-04-11 NOTE — Assessment & Plan Note (Signed)
.  Disc goals for lipids and reasons to control them Rev last labs with pt Rev low sat fat diet in detail Labs ordered

## 2022-04-17 ENCOUNTER — Inpatient Hospital Stay: Payer: Medicare PPO | Attending: Oncology | Admitting: Occupational Therapy

## 2022-04-17 DIAGNOSIS — I82A11 Acute embolism and thrombosis of right axillary vein: Secondary | ICD-10-CM

## 2022-04-17 DIAGNOSIS — R600 Localized edema: Secondary | ICD-10-CM

## 2022-04-17 NOTE — Therapy (Signed)
[facility] at [facility], [place], [place], [address] Phone: [phone]   Fax:  [phone]  Occupational Therapy Screen  Patient Details  Name: Megan Harrison MRN: [id number] Date of Birth: [birth year] No data recorded  Encounter Date: 04/17/2022   OT End of Session - 04/17/22 1745     Visit Number 0             Past Medical History:  Diagnosis Date   Anemia    Cutaneous sarcoidosis    Degenerative disc disease    Diverticulosis    Dysplastic nevus 11/19/2017   L mid back 6.0 cm lat to spine   Foot pain    GERD (gastroesophageal reflux disease)    Hyperlipidemia    Malignant neoplasm of upper-outer quadrant of left breast in female, estrogen receptor negative ([place]) 11/04/2021   Migraine    Shingles    eyes    Past Surgical History:  Procedure Laterality Date   2D Echo  1999   Negative   ABDOMINAL HYSTERECTOMY  03/1998   APPENDECTOMY     Arm lesion, cutaneous sarcoid  12/2005   with normal CXR and ANA   BREAST BIOPSY Left 10/23/2021   US biopsy/ heart clip/ [facility]   BREAST LUMPECTOMY WITH RADIOFREQUENCY TAG IDENTIFICATION Left 52/77/8242   lt br rf tag placement   BREAST LUMPECTOMY,RADIO FREQ [place] BIOPSY Left 11/20/2021   Procedure: BREAST LUMPECTOMY,RADIO FREQ LOCALIZER,AXILLARY SENTINEL LYMPH NODE BIOPSY;  Surgeon: Olean Ree, MD;  Location: [facility];  Service: General;  Laterality: Left;   CESAREAN SECTION     x2   COLONOSCOPY  11/0/2009,08/18/13   diverticulosis and hemorrhoids   DIAGNOSTIC LAPAROSCOPY     EYE SURGERY  10/13/2018   had shingles in left eye, had to have surgery, has contact lens in now.   FOOT NEUROMA SURGERY Left    LAPAROSCOPIC APPENDECTOMY N/A 02/18/2016   Procedure: APPENDECTOMY LAPAROSCOPIC;  Surgeon: Ralene Ok, MD;  Location: [place];  Service: General;  Laterality: N/A;   LUMBAR LAMINECTOMY  10/2017   PERIPHERAL VASCULAR  THROMBECTOMY Right 01/16/2022   Procedure: PERIPHERAL VASCULAR THROMBECTOMY;  Surgeon: Algernon Huxley, MD;  Location: [place] [facility];  Service: Cardiovascular;  Laterality: Right;   PORTA CATH REMOVAL N/A 02/28/2022   Procedure: PORTA CATH REMOVAL;  Surgeon: Algernon Huxley, MD;  Location: [place] [facility];  Service: Cardiovascular;  Laterality: N/A;   PORTACATH PLACEMENT Right 12/05/2021   Procedure: INSERTION PORT-A-CATH;  Surgeon: Olean Ree, MD;  Location: [facility];  Service: General;  Laterality: Right;   ROTATOR CUFF REPAIR Left    TONSILLECTOMY     UPPER GASTROINTESTINAL ENDOSCOPY  11/15/2010   non-erosive gastritis, H. pylori negative    There were no vitals filed for this visit.   Subjective Assessment - 04/17/22 1744     Subjective  I had blood clot in my R forearm after port - arm was really bad swollen -came down a alot - still swollen and tender laying my forearm down -legs doing better to- wearing compression on legs every day    Currently in Pain? No/denies                 LYMPHEDEMA/ONCOLOGY QUESTIONNAIRE - 04/17/22 0001       Right Upper Extremity Lymphedema   15 cm Proximal to Olecranon Process 36.4 cm    10 cm Proximal to Olecranon Process 36 cm  Olecranon Process 27.8 cm    15 cm Proximal to Ulnar Styloid Process 26.7 cm    10 cm Proximal to Ulnar Styloid Process 24 cm    Just Proximal to Ulnar Styloid Process 16 cm      Left Upper Extremity Lymphedema   15 cm Proximal to Olecranon Process 34 cm    10 cm Proximal to Olecranon Process 33.5 cm    Olecranon Process 27 cm    15 cm Proximal to Ulnar Styloid Process 25.5 cm    10 cm Proximal to Ulnar Styloid Process 22.5 cm    Just Proximal to Ulnar Styloid Process 16 cm             I had my lumpectomy 7/23.  With my Chemo-Port I developed DVT my right arm.  I am on Eliquis and will take that problem until the end of the month. Swelling my arm looks much better than it used to be but I can  still see his larger.  Both my legs were swollen to but I am wearing compression and that is also better. I used to walk for exercise but I had left a neuroma removed from my foot and that is causing a lot of pain. I like to watch TV and read and have a little dog.  Right-hand-dominant.  I had radiation -finished 04/03/2022. Circumference in bilateral upper extremities was taken right upper extremity increased 2.5 over upper arm.  Elbow 0.8.  Forearm 1.5 cm. Patient was added on some MLD to be done in supine focusing mostly on stimulating the right axillary lymph nodes with MLD to upper outside arm elbow to shoulder and then inside of upper arm to the outside in repeat or rework upper arm.  Stimulating lymph nodes in right axilla again and pumping across chest to left side. Can do 10 reps of each 2 times a day. Can follow-up with me if needed.                                  Visit Diagnosis: Acute deep vein thrombosis (DVT) of axillary vein of right upper extremity (HCC)  Localized edema    Problem List Patient Active Problem List   Diagnosis Date Noted   Neuroma of foot 04/11/2022   Acute deep vein thrombosis (DVT) of axillary vein of right upper extremity ([place]) 01/15/2022   Goals of care, counseling/discussion 11/30/2021   Malignant neoplasm of upper-outer quadrant of left breast in female, estrogen receptor negative ([place]) 11/04/2021   Medicare annual wellness visit, subsequent 02/15/2021   Osteopenia 01/31/2019   Estrogen deficiency 01/18/2019   Welcome to Medicare preventive visit 01/11/2019   Family history of colon cancer - brother in early 60's 10/21/2018   Encounter for screening mammogram for breast cancer 05/19/2017   Hyperlipidemia 12/10/2016   ETD (eustachian tube dysfunction) 11/27/2015   Encounter for routine gynecological examination 11/04/2014   Hemorrhoids, internal 04/02/2013   Routine general medical examination at a health care  facility 06/09/2011   ALLERGIC RHINITIS 03/10/2009   History of shingles 08/28/2006    Rosalyn Gess, OTR/L,CLT 04/17/2022, 5:48 PM  [place] at [facility] [address], [place], [place], [address] Phone: [phone]   Fax:  [phone]  Name: Megan Harrison MRN: [id number] Date of Birth: 10-13-1955

## 2022-05-04 ENCOUNTER — Other Ambulatory Visit: Payer: Self-pay | Admitting: Family Medicine

## 2022-05-07 NOTE — Telephone Encounter (Signed)
On list as historical entry, last filled by PCP on 02/15/21 #90 tabs/ 3 refills, CPE was on 04/11/22

## 2022-05-07 NOTE — Telephone Encounter (Signed)
She is on eliquis still?  No supposed to mix this with nsaids

## 2022-05-07 NOTE — Telephone Encounter (Signed)
Pt said she only has 1 week left of Eliquis and then she's done. Pt said she has been taking the meloxicam the whole time she was on eliquis but she doesn't mix it with any other nsaids, pt is requesting PCP filled Rx

## 2022-05-08 DIAGNOSIS — H18422 Band keratopathy, left eye: Secondary | ICD-10-CM | POA: Diagnosis not present

## 2022-05-08 DIAGNOSIS — H4062X1 Glaucoma secondary to drugs, left eye, mild stage: Secondary | ICD-10-CM | POA: Diagnosis not present

## 2022-05-08 DIAGNOSIS — H35341 Macular cyst, hole, or pseudohole, right eye: Secondary | ICD-10-CM | POA: Diagnosis not present

## 2022-05-08 DIAGNOSIS — Z0100 Encounter for examination of eyes and vision without abnormal findings: Secondary | ICD-10-CM | POA: Diagnosis not present

## 2022-05-13 ENCOUNTER — Ambulatory Visit
Admission: RE | Admit: 2022-05-13 | Discharge: 2022-05-13 | Disposition: A | Discharge status: Home / Self Care | Payer: Medicare PPO | Source: Ambulatory Visit | Attending: Radiation Oncology | Admitting: Radiation Oncology

## 2022-05-13 ENCOUNTER — Encounter: Payer: Self-pay | Admitting: Radiation Oncology

## 2022-05-13 VITALS — BP 155/78 | HR 62 | Temp 98.5°F | Resp 16 | Ht 63.5 in | Wt 174.0 lb

## 2022-05-13 DIAGNOSIS — C50412 Malignant neoplasm of upper-outer quadrant of left female breast: Secondary | ICD-10-CM | POA: Diagnosis not present

## 2022-05-13 DIAGNOSIS — Z171 Estrogen receptor negative status [ER-]: Secondary | ICD-10-CM | POA: Diagnosis not present

## 2022-05-13 NOTE — Progress Notes (Signed)
Radiation Oncology Follow up Note  Name: Megan Harrison   Date:   05/13/2022 MRN:  [id number] DOB: 03-05-1956    This 67 y.o. female presents to the clinic today for 1 month follow-up status post whole breast radiation to her left breast for stage Ia triple negative breast cancer status post wide local excision.Marland Kitchen  REFERRING PROVIDER: Tower, Wynelle Fanny, MD  HPI: Patient is a 67 year old female now out 1 month having completed whole breast radiation to her left breast for stage Ia (pT1b N0 M0) triple negative invasive mammary carcinoma.  Seen today in routine follow-up she is doing fairly well.  She still has some discoloration of the left breast from her sentinel node injection.  She otherwise specifically denies breast tenderness cough or bone pain.  She is not on endocrine therapy based on the triple negative nature of her disease..  COMPLICATIONS OF TREATMENT: none  FOLLOW UP COMPLIANCE: keeps appointments   PHYSICAL EXAM:  BP (!) 155/78 (BP Location: Right Arm, Patient Position: Sitting, Cuff Size: Large)   Pulse 62   Temp 98.5 F (36.9 C) (Tympanic)   Resp 16   Ht 5' 3.5" (1.613 m)   Wt 174 lb (78.9 kg)   BMI 30.34 kg/m  Lungs are clear to A&P cardiac examination essentially unremarkable with regular rate and rhythm. No dominant mass or nodularity is noted in either breast in 2 positions examined. Incision is well-healed. No axillary or supraclavicular adenopathy is appreciated. Cosmetic result is good.  There is still some discoloration from her sentinel node biopsy.  Well-developed well-nourished patient in NAD. HEENT reveals PERLA, EOMI, discs not visualized.  Oral cavity is clear. No oral mucosal lesions are identified. Neck is clear without evidence of cervical or supraclavicular adenopathy. Lungs are clear to A&P. Cardiac examination is essentially unremarkable with regular rate and rhythm without murmur rub or thrill. Abdomen is benign with no organomegaly or masses noted. Motor  sensory and DTR levels are equal and symmetric in the upper and lower extremities. Cranial nerves II through XII are grossly intact. Proprioception is intact. No peripheral adenopathy or edema is identified. No motor or sensory levels are noted. Crude visual fields are within normal range.  RADIOLOGY RESULTS: No current films for review  PLAN: Present time patient is doing well 1 month out from whole breast radiation and pleased with her overall progress.  I will see her back in 6 months for follow-up.  Patient is to call with any concerns at any time.  I would like to take this opportunity to thank you for allowing me to participate in the care of your patient.Noreene Filbert, MD

## 2022-05-14 ENCOUNTER — Other Ambulatory Visit: Payer: Self-pay

## 2022-05-16 ENCOUNTER — Encounter: Payer: Self-pay | Admitting: Internal Medicine

## 2022-05-16 ENCOUNTER — Ambulatory Visit: Payer: Medicare PPO | Admitting: Internal Medicine

## 2022-05-16 VITALS — BP 138/84 | HR 77 | Temp 97.2°F | Ht 63.0 in | Wt 175.0 lb

## 2022-05-16 DIAGNOSIS — N3000 Acute cystitis without hematuria: Secondary | ICD-10-CM | POA: Diagnosis not present

## 2022-05-16 LAB — POC URINALSYSI DIPSTICK (AUTOMATED)
Bilirubin, UA: NEGATIVE
Glucose, UA: NEGATIVE
Ketones, UA: NEGATIVE
Nitrite, UA: NEGATIVE
Protein, UA: POSITIVE — AB
Spec Grav, UA: 1.02 (ref 1.010–1.025)
Urobilinogen, UA: 0.2 E.U./dL
pH, UA: 6 (ref 5.0–8.0)

## 2022-05-16 MED ORDER — SULFAMETHOXAZOLE-TRIMETHOPRIM 800-160 MG PO TABS: 1.0000 | ORAL_TABLET | Freq: Two times a day (BID) | ORAL | 1 refills | Status: DC

## 2022-05-16 NOTE — Addendum Note (Signed)
Addended by: Pilar Grammes on: 05/16/2022 10:50 AM   Modules accepted: Orders

## 2022-05-16 NOTE — Progress Notes (Signed)
Subjective:    Patient ID: Megan Harrison, female    DOB: 08/31/1955, 67 y.o.   MRN: [id number]  HPI Here due to urinary symptoms  Have urinary urgency and burning dysuria Goes back a week Just hasn't gone away---has been recurrent Used monistat--usually helps Drinks a lot of fluids regularly No blood  Just finished RT and chemo for breast cancer  Current Outpatient Medications on File Prior to Visit  Medication Sig Dispense Refill   acetaminophen (TYLENOL) 500 MG tablet Take 2 tablets (1,000 mg total) by mouth every 6 (six) hours as needed for mild pain.     apixaban (ELIQUIS) 5 MG TABS tablet Take 1 tablet (5 mg total) by mouth 2 (two) times daily. 60 tablet 2   atorvastatin (LIPITOR) 10 MG tablet Take 1 tablet by mouth once daily 90 tablet 0   calcium carbonate (TUMS - DOSED IN MG ELEMENTAL CALCIUM) 500 MG chewable tablet Chew 1 tablet by mouth as needed for indigestion or heartburn.     Cholecalciferol (VITAMIN D3) 50 MCG (2000 UT) TABS Take 1 tablet by mouth daily.     ferrous sulfate 325 (65 FE) MG tablet Take 325 mg by mouth daily.     fluticasone (FLONASE) 50 MCG/ACT nasal spray USE 1 SPRAY(S) IN EACH NOSTRIL ONCE DAILY AS DIRECTED 16 g 0   gabapentin (NEURONTIN) 300 MG capsule Take 300 mg by mouth daily after supper.     Magnesium Hydroxide (DULCOLAX PO) Take 1 tablet by mouth 2 (two) times daily.     meloxicam (MOBIC) 15 MG tablet TAKE 1 TABLET BY MOUTH ONCE DAILY AS NEEDED FOR PAIN 90 tablet 1   Multiple Vitamins-Minerals (ONE-A-DAY WOMENS 50 PLUS PO) Take 1 tablet by mouth daily.     prednisoLONE acetate (PRED FORTE) 1 % ophthalmic suspension Place 1 drop into the left eye every other day.     Timolol Maleate 0.5 % (DAILY) SOLN Place 1 drop into the left eye daily at 6 (six) AM.     Current Facility-Administered Medications on File Prior to Visit  Medication Dose Route Frequency Provider Last Rate Last Admin   heparin lock flush 100 UNIT/ML injection             palonosetron (ALOXI) 0.25 MG/5ML injection             Allergies  Allergen Reactions   Codeine     REACTION: syncope   Sumatriptan     REACTION: choking sensation    Past Medical History:  Diagnosis Date   Anemia    Cutaneous sarcoidosis    Degenerative disc disease    Diverticulosis    Dysplastic nevus 11/19/2017   L mid back 6.0 cm lat to spine   Foot pain    GERD (gastroesophageal reflux disease)    Hyperlipidemia    Malignant neoplasm of upper-outer quadrant of left breast in female, estrogen receptor negative ([place]) 11/04/2021   Migraine    Shingles    eyes    Past Surgical History:  Procedure Laterality Date   2D Echo  1999   Negative   ABDOMINAL HYSTERECTOMY  03/1998   APPENDECTOMY     Arm lesion, cutaneous sarcoid  12/2005   with normal CXR and ANA   BREAST BIOPSY Left 10/23/2021   US biopsy/ heart clip/ [facility]   BREAST LUMPECTOMY WITH RADIOFREQUENCY TAG IDENTIFICATION Left 01/60/1093   lt br rf tag placement   BREAST LUMPECTOMY,RADIO FREQ LOCALIZER,AXILLARY SENTINEL LYMPH NODE BIOPSY Left 11/20/2021  Procedure: BREAST LUMPECTOMY,RADIO FREQ LOCALIZER,AXILLARY SENTINEL LYMPH NODE BIOPSY;  Surgeon: Olean Ree, MD;  Location: [facility];  Service: General;  Laterality: Left;   CESAREAN SECTION     x2   COLONOSCOPY  11/0/2009,08/18/13   diverticulosis and hemorrhoids   DIAGNOSTIC LAPAROSCOPY     EYE SURGERY  10/13/2018   had shingles in left eye, had to have surgery, has contact lens in now.   FOOT NEUROMA SURGERY Left    LAPAROSCOPIC APPENDECTOMY N/A 02/18/2016   Procedure: APPENDECTOMY LAPAROSCOPIC;  Surgeon: Ralene Ok, MD;  Location: [place];  Service: General;  Laterality: N/A;   LUMBAR LAMINECTOMY  10/2017   PERIPHERAL VASCULAR THROMBECTOMY Right 01/16/2022   Procedure: PERIPHERAL VASCULAR THROMBECTOMY;  Surgeon: Algernon Huxley, MD;  Location: [place] [facility];  Service: Cardiovascular;  Laterality: Right;   PORTA CATH REMOVAL N/A 02/28/2022    Procedure: PORTA CATH REMOVAL;  Surgeon: Algernon Huxley, MD;  Location: [place] [facility];  Service: Cardiovascular;  Laterality: N/A;   PORTACATH PLACEMENT Right 12/05/2021   Procedure: INSERTION PORT-A-CATH;  Surgeon: Olean Ree, MD;  Location: [facility];  Service: General;  Laterality: Right;   ROTATOR CUFF REPAIR Left    TONSILLECTOMY     UPPER GASTROINTESTINAL ENDOSCOPY  11/15/2010   non-erosive gastritis, H. pylori negative    Family History  Problem Relation Age of Onset   Colon cancer Brother        early 68's   Stomach cancer Brother    Heart disease Mother        CAD   Alcohol abuse Mother    Lung cancer Father    Heart disease Brother        CAD   Throat cancer Brother    Heart disease Brother        CAD, pre diabetic, obese   Colon polyps Sister    Breast cancer Neg Hx    Rectal cancer Neg Hx     Social History   Socioeconomic History   Marital status: Married    Spouse name: Not on file   Number of children: Not on file   Years of education: Not on file   Highest education level: Not on file  Occupational History   Occupation: Works for [employer]    Employer: RETIRED  Tobacco Use   Smoking status: Never   Smokeless tobacco: Never  Vaping Use   Vaping Use: Never used  Substance and Sexual Activity   Alcohol use: No    Alcohol/week: 0.0 standard drinks of alcohol   Drug use: No   Sexual activity: Yes  Other Topics Concern   Not on file  Social History Narrative   Regular exercise:  Curves, elliptical, walking.   Social Determinants of Health   Financial Resource Strain: Low Risk  (04/05/2022)   Overall Financial Resource Strain (CARDIA)    Difficulty of Paying Living Expenses: Not hard at all  Food Insecurity: No Food Insecurity (04/05/2022)   Hunger Vital Sign    Worried About Running Out of Food in the Last Year: Never true    Ran Out of Food in the Last Year: Never true  Transportation Needs: No Transportation Needs (04/05/2022)    PRAPARE - Hydrologist (Medical): No    Lack of Transportation (Non-Medical): No  Physical Activity: Insufficiently Active (02/01/2020)   Exercise Vital Sign    Days of Exercise per Week: 4 days    Minutes of Exercise per  Session: 30 min  Stress: No Stress Concern Present (04/05/2022)   [place]    Feeling of Stress : Not at all  Social Connections: [place] (04/05/2022)   Social Connection and Isolation Panel [NHANES]    Frequency of Communication with Friends and Family: More than three times a week    Frequency of Social Gatherings with Friends and Family: More than three times a week    Attends Religious Services: More than 4 times per year    Active Member of [employer] or Organizations: Yes    Attends Music therapist: More than 4 times per year    Marital Status: Married  Human resources officer Violence: Not At Risk (04/05/2022)   Humiliation, Afraid, Rape, and Kick questionnaire    Fear of Current or Ex-Partner: No    Emotionally Abused: No    Physically Abused: No    Sexually Abused: No   Review of Systems No fever No back pain Slight LLQ tenderness No N/V    Objective:   Physical Exam Constitutional:      Appearance: Normal appearance.  Abdominal:     Palpations: Abdomen is soft.     Tenderness: There is no right CVA tenderness or left CVA tenderness.     Comments: Slight suprapubic/LLQ tenderness  Neurological:     Mental Status: She is alert.            Assessment & Plan:

## 2022-05-16 NOTE — Assessment & Plan Note (Signed)
No systemic symptoms Urinalysis shows 1+ leuks, negative nitrite Will treat with bactrim DS 1 bid for 3 days If recurs, send culture and broaden coverage

## 2022-05-19 ENCOUNTER — Other Ambulatory Visit: Payer: Self-pay | Admitting: Oncology

## 2022-05-19 ENCOUNTER — Other Ambulatory Visit: Payer: Self-pay | Admitting: Internal Medicine

## 2022-06-07 ENCOUNTER — Encounter: Payer: Self-pay | Admitting: Family Medicine

## 2022-06-10 ENCOUNTER — Ambulatory Visit: Payer: Medicare PPO | Admitting: Family Medicine

## 2022-06-10 ENCOUNTER — Encounter: Payer: Self-pay | Admitting: Family Medicine

## 2022-06-10 VITALS — BP 146/84 | HR 60 | Temp 97.2°F | Ht 65.0 in | Wt 176.2 lb

## 2022-06-10 DIAGNOSIS — N760 Acute vaginitis: Secondary | ICD-10-CM | POA: Insufficient documentation

## 2022-06-10 DIAGNOSIS — R300 Dysuria: Secondary | ICD-10-CM | POA: Diagnosis not present

## 2022-06-10 LAB — POCT WET PREP (WET MOUNT): Trichomonas Wet Prep HPF POC: ABSENT

## 2022-06-10 MED ORDER — FLUCONAZOLE 150 MG PO TABS: ORAL_TABLET | ORAL | 0 refills | Status: DC

## 2022-06-10 NOTE — Assessment & Plan Note (Signed)
Vaginal symptoms -per pt feels raw and sensitive but no discharge or itching Several topical otc tx-no improvement  Dysuria- from urine touching affected mucosa and skin  Reassuring exam Wet prep -hyphae  She has been on abx recently as well   Tx with 150 mg of diflucan- every 3 d oral for three doses Update if not starting to improve in a week or if worsening   Per pt=has had this in the past

## 2022-06-10 NOTE — Telephone Encounter (Signed)
error 

## 2022-06-10 NOTE — Progress Notes (Signed)
Subjective:    Patient ID: Megan Harrison, female    DOB: Sep 16, 1955, 67 y.o.   MRN: [id number]  HPI Pt presents for f/u re: vaginal symptoms  ? Yeast    Wt Readings from Last 3 Encounters:  06/10/22 176 lb 4 oz (79.9 kg)  05/16/22 175 lb (79.4 kg)  05/13/22 174 lb (78.9 kg)   29.33 kg/m  Vitals:   06/10/22 0850  BP: (!) 146/84  Pulse: 60  Temp: (!) 97.2 F (36.2 C)  SpO2: 99%   Burning to urinate (when urine touches the perineum) Comes and goes / ? Chronic  On /off her whole life   Had breast cancer tx with rad and chemo (triple neg) No hormone antagonist tx   Some raw feeling in vaginal area  Uncomfortable  Not really itching  No odor  No discharge   No pelvic cramping   No h/o STD Not exposed  No h/o herpes   She tried otc- 7 day, 5 day one day  Had hysterectomy in 99 for endometriosis , took ovaries also   Finished breast cancer tx rad/chemo   Was tx for uti on 1/11 with bactrim for 3 days --did 2 courses back to back Did not get better   Wet prep Results for orders placed or performed in visit on 06/10/22  POCT Wet Prep [facility])  Result Value Ref Range   Source Wet Prep POC vaginal    WBC, Wet Prep HPF POC many    Bacteria Wet Prep HPF POC Moderate (A) Few   BACTERIA WET PREP MORPHOLOGY POC     Clue Cells Wet Prep HPF POC None None   Clue Cells Wet Prep [place] POC     Yeast Wet Prep HPF POC Moderate (A) None   KOH Wet Prep POC Few (A) None   Trichomonas Wet Prep HPF POC Absent Absent    Patient Active Problem List   Diagnosis Date Noted   Vaginitis 06/10/2022   Dysuria 06/10/2022   Neuroma of foot 04/11/2022   Acute deep vein thrombosis (DVT) of axillary vein of right upper extremity ([place]) 01/15/2022   Goals of care, counseling/discussion 11/30/2021   Malignant neoplasm of upper-outer quadrant of left breast in female, estrogen receptor negative ([place]) 11/04/2021   Medicare annual wellness visit, subsequent 02/15/2021   Osteopenia  01/31/2019   Estrogen deficiency 01/18/2019   Welcome to Medicare preventive visit 01/11/2019   Family history of colon cancer - brother in early 60's 10/21/2018   Encounter for screening mammogram for breast cancer 05/19/2017   Hyperlipidemia 12/10/2016   Encounter for routine gynecological examination 11/04/2014   Hemorrhoids, internal 04/02/2013   Routine general medical examination at a health care facility 06/09/2011   ALLERGIC RHINITIS 03/10/2009   History of shingles 08/28/2006   Past Medical History:  Diagnosis Date   Anemia    Cutaneous sarcoidosis    Degenerative disc disease    Diverticulosis    Dysplastic nevus 11/19/2017   L mid back 6.0 cm lat to spine   Foot pain    GERD (gastroesophageal reflux disease)    Hyperlipidemia    Malignant neoplasm of upper-outer quadrant of left breast in female, estrogen receptor negative ([place]) 11/04/2021   Migraine    Shingles    eyes   Past Surgical History:  Procedure Laterality Date   2D Echo  1999   Negative   ABDOMINAL HYSTERECTOMY  03/1998   APPENDECTOMY     Arm lesion, cutaneous  sarcoid  12/2005   with normal CXR and ANA   BREAST BIOPSY Left 10/23/2021   [facility] biopsy[facility] heart clip/ [facility]   BREAST LUMPECTOMY WITH RADIOFREQUENCY TAG IDENTIFICATION Left 29/93/7169   lt br rf tag placement   BREAST LUMPECTOMY,RADIO FREQ LOCALIZER,AXILLARY SENTINEL LYMPH NODE BIOPSY Left 11/20/2021   Procedure: BREAST LUMPECTOMY,RADIO FREQ LOCALIZER,AXILLARY SENTINEL LYMPH NODE BIOPSY;  Surgeon: Olean Ree, MD;  Location: [facility];  Service: General;  Laterality: Left;   CESAREAN SECTION     x2   COLONOSCOPY  11/0/2009,08/18/13   diverticulosis and hemorrhoids   DIAGNOSTIC LAPAROSCOPY     EYE SURGERY  10/13/2018   had shingles in left eye, had to have surgery, has contact lens in now.   FOOT NEUROMA SURGERY Left    LAPAROSCOPIC APPENDECTOMY N/A 02/18/2016   Procedure: APPENDECTOMY LAPAROSCOPIC;  Surgeon: Ralene Ok, MD;  Location: [place];  Service: General;  Laterality: N/A;   LUMBAR LAMINECTOMY  10/2017   PERIPHERAL VASCULAR THROMBECTOMY Right 01/16/2022   Procedure: PERIPHERAL VASCULAR THROMBECTOMY;  Surgeon: Algernon Huxley, MD;  Location: [place] [facility];  Service: Cardiovascular;  Laterality: Right;   PORTA CATH REMOVAL N/A 02/28/2022   Procedure: PORTA CATH REMOVAL;  Surgeon: Algernon Huxley, MD;  Location: [place] [facility];  Service: Cardiovascular;  Laterality: N/A;   PORTACATH PLACEMENT Right 12/05/2021   Procedure: INSERTION PORT-A-CATH;  Surgeon: Olean Ree, MD;  Location: [facility];  Service: General;  Laterality: Right;   ROTATOR CUFF REPAIR Left    TONSILLECTOMY     UPPER GASTROINTESTINAL ENDOSCOPY  11/15/2010   non-erosive gastritis, H. pylori negative   Social History   Tobacco Use   Smoking status: Never   Smokeless tobacco: Never  Vaping Use   Vaping Use: Never used  Substance Use Topics   Alcohol use: No    Alcohol/week: 0.0 standard drinks of alcohol   Drug use: No   Family History  Problem Relation Age of Onset   Colon cancer Brother        early 67's   Stomach cancer Brother    Heart disease Mother        CAD   Alcohol abuse Mother    Lung cancer Father    Heart disease Brother        CAD   Throat cancer Brother    Heart disease Brother        CAD, pre diabetic, obese   Colon polyps Sister    Breast cancer Neg Hx    Rectal cancer Neg Hx    Allergies  Allergen Reactions   Codeine     REACTION: syncope   Sumatriptan     REACTION: choking sensation   Current Outpatient Medications on File Prior to Visit  Medication Sig Dispense Refill   acetaminophen (TYLENOL) 500 MG tablet Take 2 tablets (1,000 mg total) by mouth every 6 (six) hours as needed for mild pain.     aspirin EC 81 MG tablet Take 81 mg by mouth daily. Swallow whole.     atorvastatin (LIPITOR) 10 MG tablet Take 1 tablet by mouth once daily 90 tablet 0   calcium carbonate (TUMS - DOSED IN MG ELEMENTAL CALCIUM)  500 MG chewable tablet Chew 1 tablet by mouth as needed for indigestion or heartburn.     Cholecalciferol (VITAMIN D3) 50 MCG (2000 UT) TABS Take 1 tablet by mouth daily.     ferrous sulfate 325 (65 FE) MG tablet Take 325 mg by mouth  daily.     fluticasone (FLONASE) 50 MCG/ACT nasal spray USE 1 SPRAY(S) IN EACH NOSTRIL ONCE DAILY AS DIRECTED 16 g 0   gabapentin (NEURONTIN) 300 MG capsule Take 300 mg by mouth daily after supper.     Magnesium Hydroxide (DULCOLAX PO) Take 1 tablet by mouth 2 (two) times daily.     meloxicam (MOBIC) 15 MG tablet TAKE 1 TABLET BY MOUTH ONCE DAILY AS NEEDED FOR PAIN 90 tablet 1   Multiple Vitamins-Minerals (ONE-A-DAY WOMENS 50 PLUS PO) Take 1 tablet by mouth daily.     prednisoLONE acetate (PRED FORTE) 1 % ophthalmic suspension Place 1 drop into the left eye every other day.     Timolol Maleate 0.5 % (DAILY) SOLN Place 1 drop into the left eye daily at 6 (six) AM.     Current Facility-Administered Medications on File Prior to Visit  Medication Dose Route Frequency Provider Last Rate Last Admin   heparin lock flush 100 UNIT/ML injection            palonosetron (ALOXI) 0.25 MG/5ML injection              Review of Systems  Genitourinary:  Positive for dysuria and vaginal pain. Negative for decreased urine volume, frequency, hematuria, pelvic pain, vaginal bleeding and vaginal discharge.       Objective:   Physical Exam Exam conducted with a chaperone present.  Constitutional:      General: She is not in acute distress.    Appearance: Normal appearance. She is well-developed.     Comments: overweight  HENT:     Head: Normocephalic and atraumatic.  Eyes:     Conjunctiva/sclera: Conjunctivae normal.     Pupils: Pupils are equal, round, and reactive to light.  Neck:     Thyroid: No thyromegaly.     Vascular: No carotid bruit or JVD.  Cardiovascular:     Rate and Rhythm: Normal rate and regular rhythm.     Heart sounds: Normal heart sounds.     No gallop.   Pulmonary:     Effort: Pulmonary effort is normal. No respiratory distress.     Breath sounds: Normal breath sounds.  Abdominal:     General: There is no distension or abdominal bruit.     Palpations: Abdomen is soft.     Comments: No suprapubic tenderness or fullness    Genitourinary:    Exam position: Supine.     Labia:        Right: No rash, tenderness, lesion or injury.        Left: No rash, tenderness, lesion or injury.      Urethra: No prolapse, urethral pain, urethral swelling or urethral lesion.     Comments: No excessive vaginal discharge Wet prep obtained  Musculoskeletal:     Cervical back: Normal range of motion and neck supple.     Right lower leg: No edema.     Left lower leg: No edema.  Lymphadenopathy:     Cervical: No cervical adenopathy.  Skin:    General: Skin is warm and dry.     Coloration: Skin is not pale.     Findings: No rash.  Neurological:     Mental Status: She is alert.     Coordination: Coordination normal.     Deep Tendon Reflexes: Reflexes are normal and symmetric. Reflexes normal.  Psychiatric:        Mood and Affect: Mood normal.  Assessment & Plan:   Problem List Items Addressed This Visit       Genitourinary   Vaginitis - Primary    Vaginal symptoms -per pt feels raw and sensitive but no discharge or itching Several topical otc tx-no improvement  Dysuria- from urine touching affected mucosa and skin  Reassuring exam Wet prep -hyphae  She has been on abx recently as well   Tx with 150 mg of diflucan- every 3 d oral for three doses Update if not starting to improve in a week or if worsening   Per pt=has had this in the past       Relevant Orders   POCT Wet Prep [facility]) (Completed)     Other   Dysuria    Unsure if this is urinary or from vaginitis (when urine touches skin or mucosa)  No imp with abx earlier in mo  Some yeast on wet prep  (will tx vaginitis with diflucan)   Urine culutre pending         Relevant Orders   Urine Culture

## 2022-06-10 NOTE — Patient Instructions (Addendum)
Eat yogurt daily or take a probiotic   Take diflucan as directed for yeast   I also want to send a urine culture  Leave a sample on the way out     Update if not starting to improve in a week or if worsening

## 2022-06-10 NOTE — Assessment & Plan Note (Signed)
Unsure if this is urinary or from vaginitis (when urine touches skin or mucosa)  No imp with abx earlier in mo  Some yeast on wet prep  (will tx vaginitis with diflucan)   Urine culutre pending

## 2022-06-11 LAB — URINE CULTURE
MICRO NUMBER:: 14519129
Result:: NO GROWTH
SPECIMEN QUALITY:: ADEQUATE

## 2022-06-20 ENCOUNTER — Encounter: Payer: Self-pay | Admitting: Family Medicine

## 2022-06-20 DIAGNOSIS — N760 Acute vaginitis: Secondary | ICD-10-CM

## 2022-06-29 ENCOUNTER — Other Ambulatory Visit: Payer: Self-pay | Admitting: Family Medicine

## 2022-07-11 ENCOUNTER — Ambulatory Visit: Payer: Medicare PPO | Admitting: Certified Nurse Midwife

## 2022-07-11 ENCOUNTER — Encounter: Payer: Self-pay | Admitting: Certified Nurse Midwife

## 2022-07-11 VITALS — BP 132/84 | HR 69 | Resp 15 | Ht 63.5 in | Wt 173.0 lb

## 2022-07-11 DIAGNOSIS — Z8742 Personal history of other diseases of the female genital tract: Secondary | ICD-10-CM | POA: Diagnosis not present

## 2022-07-11 NOTE — Progress Notes (Signed)
GYN ENCOUNTER NOTE  Subjective:       Megan Harrison is a 67 y.o. No obstetric history on file. female is here for gynecologic evaluation of the following issues:  1. History or recurrent vaginal infections, pt was referred by her PCP. She does not currently have any symptoms and declines testing today .     Gynecologic History No LMP recorded. Patient has had a hysterectomy. Contraception: status post hysterectomy Last Pap:11/05/15 Results were: normal Last mammogram: 11/20/2021. Results were: history breast cancer this past year   Obstetric History OB History  No obstetric history on file.    Past Medical History:  Diagnosis Date   Anemia    Cutaneous sarcoidosis    Degenerative disc disease    Diverticulosis    Dysplastic nevus 11/19/2017   L mid back 6.0 cm lat to spine   Foot pain    GERD (gastroesophageal reflux disease)    Hyperlipidemia    Malignant neoplasm of upper-outer quadrant of left breast in female, estrogen receptor negative ([place]) 11/04/2021   Migraine    Shingles    eyes    Past Surgical History:  Procedure Laterality Date   2D Echo  1999   Negative   ABDOMINAL HYSTERECTOMY  03/1998   APPENDECTOMY     Arm lesion, cutaneous sarcoid  12/2005   with normal CXR and ANA   BREAST BIOPSY Left 10/23/2021   [facility] biopsy/ heart clip/ [facility]   BREAST LUMPECTOMY WITH RADIOFREQUENCY TAG IDENTIFICATION Left [id number]   lt br rf tag placement   BREAST LUMPECTOMY,RADIO FREQ [place] BIOPSY Left 11/20/2021   Procedure: BREAST LUMPECTOMY,RADIO FREQ LOCALIZER,AXILLARY SENTINEL LYMPH NODE BIOPSY;  Surgeon: Olean Ree, MD;  Location: [facility];  Service: General;  Laterality: Left;   CESAREAN SECTION     x2   COLONOSCOPY  11/0/2009,08/18/13   diverticulosis and hemorrhoids   DIAGNOSTIC LAPAROSCOPY     EYE SURGERY  10/13/2018   had shingles in left eye, had to have surgery, has contact lens in now.   FOOT NEUROMA SURGERY Left    LAPAROSCOPIC  APPENDECTOMY N/A 02/18/2016   Procedure: APPENDECTOMY LAPAROSCOPIC;  Surgeon: Ralene Ok, MD;  Location: [place];  Service: General;  Laterality: N/A;   LUMBAR LAMINECTOMY  10/2017   PERIPHERAL VASCULAR THROMBECTOMY Right 01/16/2022   Procedure: PERIPHERAL VASCULAR THROMBECTOMY;  Surgeon: Algernon Huxley, MD;  Location: [place] [facility];  Service: Cardiovascular;  Laterality: Right;   PORTA CATH REMOVAL N/A 02/28/2022   Procedure: PORTA CATH REMOVAL;  Surgeon: Algernon Huxley, MD;  Location: [place] [facility];  Service: Cardiovascular;  Laterality: N/A;   PORTACATH PLACEMENT Right 12/05/2021   Procedure: INSERTION PORT-A-CATH;  Surgeon: Olean Ree, MD;  Location: [facility];  Service: General;  Laterality: Right;   ROTATOR CUFF REPAIR Left    TONSILLECTOMY     UPPER GASTROINTESTINAL ENDOSCOPY  11/15/2010   non-erosive gastritis, H. pylori negative    Current Outpatient Medications on File Prior to Visit  Medication Sig Dispense Refill   acetaminophen (TYLENOL) 500 MG tablet Take 2 tablets (1,000 mg total) by mouth every 6 (six) hours as needed for mild pain.     aspirin EC 81 MG tablet Take 81 mg by mouth daily. Swallow whole.     atorvastatin (LIPITOR) 10 MG tablet Take 1 tablet by mouth once daily 90 tablet 3   calcium carbonate (TUMS - DOSED IN MG ELEMENTAL CALCIUM) 500 MG chewable tablet Chew 1 tablet by  mouth as needed for indigestion or heartburn.     Cholecalciferol (VITAMIN D3) 50 MCG (2000 UT) TABS Take 1 tablet by mouth daily.     ferrous sulfate 325 (65 FE) MG tablet Take 325 mg by mouth daily.     fluticasone (FLONASE) 50 MCG/ACT nasal spray USE 1 SPRAY(S) IN EACH NOSTRIL ONCE DAILY AS DIRECTED 16 g 0   gabapentin (NEURONTIN) 300 MG capsule Take 300 mg by mouth daily after supper.     Magnesium Hydroxide (DULCOLAX PO) Take 1 tablet by mouth 2 (two) times daily.     meloxicam (MOBIC) 15 MG tablet TAKE 1 TABLET BY MOUTH ONCE DAILY AS NEEDED FOR PAIN 90 tablet 1   Multiple  Vitamins-Minerals (ONE-A-DAY WOMENS 50 PLUS PO) Take 1 tablet by mouth daily.     OVER THE COUNTER MEDICATION [place] probiotic     prednisoLONE acetate (PRED FORTE) 1 % ophthalmic suspension Place 1 drop into the left eye every other day.     Timolol Maleate 0.5 % (DAILY) SOLN Place 1 drop into the left eye daily at 6 (six) AM.     fluconazole (DIFLUCAN) 150 MG tablet Take one pill by mouth every 3 days 3 tablet 0   Current Facility-Administered Medications on File Prior to Visit  Medication Dose Route Frequency Provider Last Rate Last Admin   heparin lock flush 100 UNIT/ML injection            palonosetron (ALOXI) 0.25 MG/5ML injection             Allergies  Allergen Reactions   Codeine     REACTION: syncope   Sumatriptan     REACTION: choking sensation    Social History   Socioeconomic History   Marital status: Married    Spouse name: Not on file   Number of children: Not on file   Years of education: Not on file   Highest education level: Not on file  Occupational History   Occupation: Works for [employer]    Employer: RETIRED  Tobacco Use   Smoking status: Never   Smokeless tobacco: Never  Vaping Use   Vaping Use: Never used  Substance and Sexual Activity   Alcohol use: No    Alcohol/week: 0.0 standard drinks of alcohol   Drug use: No   Sexual activity: Yes  Other Topics Concern   Not on file  Social History Narrative   Regular exercise:  Curves, elliptical, walking.   Social Determinants of Health   Financial Resource Strain: Low Risk  (04/05/2022)   Overall Financial Resource Strain (CARDIA)    Difficulty of Paying Living Expenses: Not hard at all  Food Insecurity: No Food Insecurity (04/05/2022)   Hunger Vital Sign    Worried About Running Out of Food in the Last Year: Never true    Ran Out of Food in the Last Year: Never true  Transportation Needs: No Transportation Needs (04/05/2022)   PRAPARE - Hydrologist (Medical):  No    Lack of Transportation (Non-Medical): No  Physical Activity: Insufficiently Active (02/01/2020)   Exercise Vital Sign    Days of Exercise per Week: 4 days    Minutes of Exercise per Session: 30 min  Stress: No Stress Concern Present (04/05/2022)   [place]    Feeling of Stress : Not at all  Social Connections: [place] (04/05/2022)   Social Connection and Isolation Panel [NHANES]  Frequency of Communication with Friends and Family: More than three times a week    Frequency of Social Gatherings with Friends and Family: More than three times a week    Attends Religious Services: More than 4 times per year    Active Member of [employer] or Organizations: Yes    Attends Music therapist: More than 4 times per year    Marital Status: Married  Human resources officer Violence: Not At Risk (04/05/2022)   Humiliation, Afraid, Rape, and Kick questionnaire    Fear of Current or Ex-Partner: No    Emotionally Abused: No    Physically Abused: No    Sexually Abused: No    Family History  Problem Relation Age of Onset   Colon cancer Brother        early 104's   Stomach cancer Brother    Heart disease Mother        CAD   Alcohol abuse Mother    Lung cancer Father    Heart disease Brother        CAD   Throat cancer Brother    Heart disease Brother        CAD, pre diabetic, obese   Colon polyps Sister    Breast cancer Neg Hx    Rectal cancer Neg Hx     The following portions of the patient's history were reviewed and updated as appropriate: allergies, current medications, past family history, past medical history, past social history, past surgical history and problem list.  Review of Systems Review of Systems - Negative except as mentioned in HPI Review of Systems - General ROS: negative for - chills, fatigue, fever, hot flashes, malaise or night sweats Hematological and Lymphatic ROS: negative for -  bleeding problems or swollen lymph nodes Gastrointestinal ROS: negative for - abdominal pain, blood in stools, change in bowel habits and nausea/vomiting Musculoskeletal ROS: negative for - joint pain, muscle pain or muscular weakness Genito-Urinary ROS: negative for - change in menstrual cycle, dysmenorrhea, dyspareunia, dysuria, genital discharge, genital ulcers, hematuria, incontinence, irregular/heavy menses, nocturia or pelvic pain. History recurrent vaginal infections.  Objective:   BP 132/84   Pulse 69   Resp 15   Ht 5' 3.5" (1.613 m) Comment: patient reports  Wt 173 lb (78.5 kg)   BMI 30.16 kg/m  CONSTITUTIONAL: Well-developed, well-nourished female in no acute distress.  HENT:  Normocephalic, atraumatic.  NECK: Normal range of motion, supple, no masses.  Normal thyroid.  SKIN: Skin is warm and dry. No rash noted. Not diaphoretic. No erythema. No pallor. [place]: Alert and oriented to person, place, and time. PSYCHIATRIC: Normal mood and affect. Normal behavior. Normal judgment and thought content. CARDIOVASCULAR:Not Examined RESPIRATORY: Not Examined BREASTS: Not Examined ABDOMEN: Soft, non distended; Non tender.  No Organomegaly. PELVIC:pt declines exam today, no symptoms of infection, she has had a total hysterectomy and denies need for bimanual, she is not sexually active  MUSCULOSKELETAL: Normal range of motion. No tenderness.  No cyanosis, clubbing, or edema.     Assessment:   History recurrent vaginal infections    Plan:   Discussed yeast and bacterial vaginosis common causes of infection including menopause. Discussed probiotic use and use of boric acid as needed. Sample of boric acid given. Discussed with active infection use medication regimen for resistant /recurrent infection. Pt will send message or call should infection re occurs. Reviewed vaginal estrogen replacement as second line due to history of breast cancer. Recommend first line treatments vaginal  moisturizers and  boric acid. Discussed risks, she declines any hormonal options for treatment due to history of breast cancer.   Follow up prn. Face to face time 15 min.   Philip Aspen, CNM

## 2022-07-31 ENCOUNTER — Encounter: Payer: Self-pay | Admitting: Oncology

## 2022-07-31 ENCOUNTER — Inpatient Hospital Stay: Payer: Medicare PPO | Attending: Oncology | Admitting: Oncology

## 2022-07-31 VITALS — BP 137/89 | HR 66 | Temp 98.3°F | Resp 18 | Ht 63.0 in | Wt 176.8 lb

## 2022-07-31 DIAGNOSIS — Z923 Personal history of irradiation: Secondary | ICD-10-CM | POA: Insufficient documentation

## 2022-07-31 DIAGNOSIS — Z79899 Other long term (current) drug therapy: Secondary | ICD-10-CM | POA: Insufficient documentation

## 2022-07-31 DIAGNOSIS — Z08 Encounter for follow-up examination after completed treatment for malignant neoplasm: Secondary | ICD-10-CM

## 2022-07-31 DIAGNOSIS — C50912 Malignant neoplasm of unspecified site of left female breast: Secondary | ICD-10-CM | POA: Insufficient documentation

## 2022-07-31 DIAGNOSIS — Z171 Estrogen receptor negative status [ER-]: Secondary | ICD-10-CM | POA: Insufficient documentation

## 2022-07-31 DIAGNOSIS — Z9221 Personal history of antineoplastic chemotherapy: Secondary | ICD-10-CM | POA: Insufficient documentation

## 2022-07-31 DIAGNOSIS — Z853 Personal history of malignant neoplasm of breast: Secondary | ICD-10-CM | POA: Diagnosis not present

## 2022-07-31 DIAGNOSIS — Z86718 Personal history of other venous thrombosis and embolism: Secondary | ICD-10-CM | POA: Diagnosis not present

## 2022-07-31 NOTE — Progress Notes (Signed)
Hematology/Oncology Consult note [facility]  Telephone:([phone] [phone]  Patient Care Team: Tower, Wynelle Fanny, MD as PCP - General Daiva Huge, RN as Oncology Nurse Navigator Sindy Guadeloupe, MD as Consulting Physician (Oncology) Noreene Filbert, MD as Consulting Physician (Radiation Oncology) Algernon Huxley, MD as Referring Physician (Vascular Surgery) Olean Ree, MD as Consulting Physician (General Surgery)   Name of the patient: Megan Harrison  [id number]  May 18, 1955   Date of visit: 07/31/22  Diagnosis-  pathological prognostic stage Ia invasive mammary carcinoma of the left breast pT1b N0 M0 ER/PR HER2 negative   Chief complaint/ Reason for visit-routine follow-up of breast cancer  Heme/Onc history: Patient is a 67 year old female who underwent a screening bilateral mammogram in May 2023 which showed a possible asymmetry in the left breast.  This was followed by diagnostic mammogram and ultrasound which showed a hypoechoic mass in the left breast 3 x 4 x 6 mm.  No abnormality noted in the left axillary lymph nodes.  This was biopsied and was consistent with invasive mammary carcinoma 4 mm grade 3 ER negative less than 1%, PR negative less than 1% and HER2 negative.   Final pathology showed 8 mm grade 3 triple negative tumor with negative margins.3 sentinel lymph nodes negative for malignancy.  Plan is for adjuvant TC chemotherapy x4 cycles followed by radiation   Patient on to have extensive right upper extremity DVT related to port placement after 2 cycles of chemotherapy.  Port was taken out after completing chemotherapy.  Patient completed 3 months of Eliquis and stopped it    Patient completed 4 cycles of adjuvant TC chemotherapy on 02/13/2022.  She also completed adjuvant radiation therapy  Interval history-she still has some residual neuropathy but overall has stabilized.  Port is out and she is no longer on Eliquis.  She has seen  Luna Fuse as well.  ECOG PS- 1 Pain scale- 0   Review of systems- Review of Systems  Constitutional:  Negative for chills, fever, malaise/fatigue and weight loss.  HENT:  Negative for congestion, ear discharge and nosebleeds.   Eyes:  Negative for blurred vision.  Respiratory:  Negative for cough, hemoptysis, sputum production, shortness of breath and wheezing.   Cardiovascular:  Negative for chest pain, palpitations, orthopnea and claudication.  Gastrointestinal:  Negative for abdominal pain, blood in stool, constipation, diarrhea, heartburn, melena, nausea and vomiting.  Genitourinary:  Negative for dysuria, flank pain, frequency, hematuria and urgency.  Musculoskeletal:  Negative for back pain, joint pain and myalgias.  Skin:  Negative for rash.  Neurological:  Negative for dizziness, tingling, focal weakness, seizures, weakness and headaches.  Endo/Heme/Allergies:  Does not bruise/bleed easily.  Psychiatric/Behavioral:  Negative for depression and suicidal ideas. The patient does not have insomnia.       Allergies  Allergen Reactions   Codeine     REACTION: syncope   Sumatriptan     REACTION: choking sensation     Past Medical History:  Diagnosis Date   Anemia    Cutaneous sarcoidosis    Degenerative disc disease    Diverticulosis    Dysplastic nevus 11/19/2017   L mid back 6.0 cm lat to spine   Foot pain    GERD (gastroesophageal reflux disease)    Hyperlipidemia    Malignant neoplasm of upper-outer quadrant of left breast in female, estrogen receptor negative ([place]) 11/04/2021   Migraine    Shingles    eyes  Past Surgical History:  Procedure Laterality Date   2D Echo  1999   Negative   ABDOMINAL HYSTERECTOMY  03/1998   APPENDECTOMY     Arm lesion, cutaneous sarcoid  12/2005   with normal CXR and ANA   BREAST BIOPSY Left 10/23/2021   US biopsy/ heart clip/ [facility]   BREAST LUMPECTOMY WITH RADIOFREQUENCY TAG IDENTIFICATION Left [id number]   lt br rf  tag placement   BREAST LUMPECTOMY,RADIO FREQ LOCALIZER,AXILLARY SENTINEL LYMPH NODE BIOPSY Left 11/20/2021   Procedure: BREAST LUMPECTOMY,RADIO FREQ LOCALIZER,AXILLARY SENTINEL LYMPH NODE BIOPSY;  Surgeon: Olean Ree, MD;  Location: [facility];  Service: General;  Laterality: Left;   CESAREAN SECTION     x2   COLONOSCOPY  11/0/2009,08/18/13   diverticulosis and hemorrhoids   DIAGNOSTIC LAPAROSCOPY     EYE SURGERY  10/13/2018   had shingles in left eye, had to have surgery, has contact lens in now.   FOOT NEUROMA SURGERY Left    LAPAROSCOPIC APPENDECTOMY N/A 02/18/2016   Procedure: APPENDECTOMY LAPAROSCOPIC;  Surgeon: Ralene Ok, MD;  Location: [place];  Service: General;  Laterality: N/A;   LUMBAR LAMINECTOMY  10/2017   PERIPHERAL VASCULAR THROMBECTOMY Right 01/16/2022   Procedure: PERIPHERAL VASCULAR THROMBECTOMY;  Surgeon: Algernon Huxley, MD;  Location: [place] [facility];  Service: Cardiovascular;  Laterality: Right;   PORTA CATH REMOVAL N/A 02/28/2022   Procedure: PORTA CATH REMOVAL;  Surgeon: Algernon Huxley, MD;  Location: [place] [facility];  Service: Cardiovascular;  Laterality: N/A;   PORTACATH PLACEMENT Right 12/05/2021   Procedure: INSERTION PORT-A-CATH;  Surgeon: Olean Ree, MD;  Location: [facility];  Service: General;  Laterality: Right;   ROTATOR CUFF REPAIR Left    TONSILLECTOMY     UPPER GASTROINTESTINAL ENDOSCOPY  11/15/2010   non-erosive gastritis, H. pylori negative    Social History   Socioeconomic History   Marital status: Married    Spouse name: Not on file   Number of children: Not on file   Years of education: Not on file   Highest education level: Not on file  Occupational History   Occupation: Works for [employer]    Employer: RETIRED  Tobacco Use   Smoking status: Never   Smokeless tobacco: Never  Vaping Use   Vaping Use: Never used  Substance and Sexual Activity   Alcohol use: No    Alcohol/week: 0.0 standard drinks of alcohol   Drug use:  No   Sexual activity: Yes  Other Topics Concern   Not on file  Social History Narrative   Regular exercise:  Curves, elliptical, walking.   Social Determinants of Health   Financial Resource Strain: Low Risk  (04/05/2022)   Overall Financial Resource Strain (CARDIA)    Difficulty of Paying Living Expenses: Not hard at all  Food Insecurity: No Food Insecurity (04/05/2022)   Hunger Vital Sign    Worried About Running Out of Food in the Last Year: Never true    Ran Out of Food in the Last Year: Never true  Transportation Needs: No Transportation Needs (04/05/2022)   PRAPARE - Hydrologist (Medical): No    Lack of Transportation (Non-Medical): No  Physical Activity: Insufficiently Active (02/01/2020)   Exercise Vital Sign    Days of Exercise per Week: 4 days    Minutes of Exercise per Session: 30 min  Stress: No Stress Concern Present (04/05/2022)   [place]    Feeling  of Stress : Not at all  Social Connections: Socially Integrated (04/05/2022)   Social Connection and Isolation Panel [NHANES]    Frequency of Communication with Friends and Family: More than three times a week    Frequency of Social Gatherings with Friends and Family: More than three times a week    Attends Religious Services: More than 4 times per year    Active Member of [employer] or Organizations: Yes    Attends Music therapist: More than 4 times per year    Marital Status: Married  Human resources officer Violence: Not At Risk (04/05/2022)   Humiliation, Afraid, Rape, and Kick questionnaire    Fear of Current or Ex-Partner: No    Emotionally Abused: No    Physically Abused: No    Sexually Abused: No    Family History  Problem Relation Age of Onset   Colon cancer Brother        early 50's   Stomach cancer Brother    Heart disease Mother        CAD   Alcohol abuse Mother    Lung cancer Father    Heart disease  Brother        CAD   Throat cancer Brother    Heart disease Brother        CAD, pre diabetic, obese   Colon polyps Sister    Breast cancer Neg Hx    Rectal cancer Neg Hx      Current Outpatient Medications:    acetaminophen (TYLENOL) 500 MG tablet, Take 2 tablets (1,000 mg total) by mouth every 6 (six) hours as needed for mild pain., Disp: , Rfl:    aspirin EC 81 MG tablet, Take 81 mg by mouth daily. Swallow whole., Disp: , Rfl:    atorvastatin (LIPITOR) 10 MG tablet, Take 1 tablet by mouth once daily, Disp: 90 tablet, Rfl: 3   calcium carbonate (TUMS - DOSED IN MG ELEMENTAL CALCIUM) 500 MG chewable tablet, Chew 1 tablet by mouth as needed for indigestion or heartburn., Disp: , Rfl:    Cholecalciferol (VITAMIN D3) 50 MCG (2000 UT) TABS, Take 1 tablet by mouth daily., Disp: , Rfl:    ferrous sulfate 325 (65 FE) MG tablet, Take 325 mg by mouth daily., Disp: , Rfl:    fluticasone (FLONASE) 50 MCG/ACT nasal spray, USE 1 SPRAY(S) IN EACH NOSTRIL ONCE DAILY AS DIRECTED, Disp: 16 g, Rfl: 0   gabapentin (NEURONTIN) 300 MG capsule, Take 300 mg by mouth daily after supper., Disp: , Rfl:    Magnesium Hydroxide (DULCOLAX PO), Take 1 tablet by mouth 2 (two) times daily., Disp: , Rfl:    meloxicam (MOBIC) 15 MG tablet, TAKE 1 TABLET BY MOUTH ONCE DAILY AS NEEDED FOR PAIN, Disp: 90 tablet, Rfl: 1   Multiple Vitamins-Minerals (ONE-A-DAY WOMENS 50 PLUS PO), Take 1 tablet by mouth daily., Disp: , Rfl:    OVER THE COUNTER MEDICATION, Cultrelle probiotic, Disp: , Rfl:    prednisoLONE acetate (PRED FORTE) 1 % ophthalmic suspension, Place 1 drop into the left eye every other day., Disp: , Rfl:    Timolol Maleate 0.5 % (DAILY) SOLN, Place 1 drop into the left eye daily at 6 (six) AM., Disp: , Rfl:  No current facility-administered medications for this visit.  Facility-Administered Medications Ordered in Other Visits:    heparin lock flush 100 UNIT/ML injection, , , ,    palonosetron (ALOXI) 0.25 MG/5ML  injection, , , ,   Physical exam:  Vitals:  07/31/22 0954  BP: 137/89  Pulse: 66  Resp: 18  Temp: 98.3 F (36.8 C)  TempSrc: Tympanic  SpO2: 97%  Weight: 176 lb 12.8 oz (80.2 kg)  Height: 5\' 3"  (1.6 m)   Physical Exam Cardiovascular:     Rate and Rhythm: Normal rate and regular rhythm.     Heart sounds: Normal heart sounds.  Pulmonary:     Effort: Pulmonary effort is normal.     Breath sounds: Normal breath sounds.  Skin:    General: Skin is warm and dry.  Neurological:     Mental Status: She is alert and oriented to person, place, and time.    Breast exam was performed in seated and lying down position. Patient is status post left lumpectomy with a well-healed surgical scar.  She has evidence of hyperpigmentation from the dye used for surgery around the areola.  No evidence of any palpable masses. No evidence of axillary adenopathy. No evidence of any palpable masses or lumps in the right breast. No evidence of right axillary adenopathy      Latest Ref Rng & Units 04/11/2022    9:55 AM  CMP  Glucose 70 - 99 mg/dL 95   BUN 6 - 23 mg/dL 15   Creatinine 0.40 - 1.20 mg/dL 0.60   Sodium 135 - 145 mEq/L 143   Potassium 3.5 - 5.1 mEq/L 4.1   Chloride 96 - 112 mEq/L 107   CO2 19 - 32 mEq/L 29   Calcium 8.4 - 10.5 mg/dL 9.1   Total Protein 6.0 - 8.3 g/dL 6.3   Total Bilirubin 0.2 - 1.2 mg/dL 0.3   Alkaline Phos 39 - 117 U/L 60   AST 0 - 37 U/L 16   ALT 0 - 35 U/L 11       Latest Ref Rng & Units 04/11/2022    9:55 AM  CBC  WBC 4.0 - 10.5 K/uL 5.1   Hemoglobin 12.0 - 15.0 g/dL 11.5   Hematocrit 36.0 - 46.0 % 33.5   Platelets 150.0 - 400.0 K/uL 314.0      Assessment and plan- Patient is a 66 y.o. female with stage I triple negative left breast cancer pT1b N0 M0.  She is s/p 4 cycles of adjuvant TC chemotherapy and adjuvant radiation therapy.  She is here for routine follow-up  Clinically patient is doing well with no concerning signs and symptoms of recurrence based  on today's exam.  She will be due for mammogram in May 2024.  She does not have an appointment with Dr. Hampton Abbot yet and I will reach out to him as well.  If she does not hear from surgery service I will go ahead and schedule her mammogram accordingly.  I will see her back in 4 months no labs  History of right upper extremity DVT secondary to port placement: Port is now out and she is done with 3 months of Eliquis as well.  Continue to monitor   Visit Diagnosis 1. Encounter for follow-up surveillance of breast cancer      Dr. Randa Evens, MD, MPH [facility] at [facility] [id number] 07/31/2022 4:28 PM

## 2022-07-31 NOTE — Progress Notes (Signed)
Survivorship Care Plan visit completed.  Treatment summary reviewed and given to patient.  ASCO answers booklet reviewed and given to patient.  CARE program and Cancer Transitions discussed with patient along with other resources cancer center offers to patients and caregivers.  Patient verbalized understanding.    

## 2022-07-31 NOTE — Progress Notes (Signed)
No concerns. 

## 2022-08-09 ENCOUNTER — Other Ambulatory Visit: Payer: Self-pay | Admitting: Family Medicine

## 2022-08-28 ENCOUNTER — Ambulatory Visit: Payer: Medicare PPO | Admitting: Dermatology

## 2022-08-28 VITALS — BP 156/74

## 2022-08-28 DIAGNOSIS — L814 Other melanin hyperpigmentation: Secondary | ICD-10-CM | POA: Diagnosis not present

## 2022-08-28 DIAGNOSIS — I8310 Varicose veins of unspecified lower extremity with inflammation: Secondary | ICD-10-CM

## 2022-08-28 DIAGNOSIS — L609 Nail disorder, unspecified: Secondary | ICD-10-CM | POA: Diagnosis not present

## 2022-08-28 DIAGNOSIS — Z853 Personal history of malignant neoplasm of breast: Secondary | ICD-10-CM

## 2022-08-28 DIAGNOSIS — L821 Other seborrheic keratosis: Secondary | ICD-10-CM | POA: Diagnosis not present

## 2022-08-28 DIAGNOSIS — Z86018 Personal history of other benign neoplasm: Secondary | ICD-10-CM

## 2022-08-28 DIAGNOSIS — L578 Other skin changes due to chronic exposure to nonionizing radiation: Secondary | ICD-10-CM | POA: Diagnosis not present

## 2022-08-28 DIAGNOSIS — Z1283 Encounter for screening for malignant neoplasm of skin: Secondary | ICD-10-CM | POA: Diagnosis not present

## 2022-08-28 NOTE — Patient Instructions (Signed)
Due to recent changes in healthcare laws, you may see results of your pathology and/or laboratory studies on MyChart before the doctors have had a chance to review them. We understand that in some cases there may be results that are confusing or concerning to you. Please understand that not all results are received at the same time and often the doctors may need to interpret multiple results in order to provide you with the best plan of care or course of treatment. Therefore, we ask that you please give us 2 business days to thoroughly review all your results before contacting the office for clarification. Should we see a critical lab result, you will be contacted sooner.   If You Need Anything After Your Visit  If you have any questions or concerns for your doctor, please call our main line at [phone] and press option 4 to reach your doctor's medical assistant. If no one answers, please leave a voicemail as directed and we will return your call as soon as possible. Messages left after 4 pm will be answered the following business day.   You may also send us a message via MyChart. We typically respond to MyChart messages within 1-2 business days.  For prescription refills, please ask your pharmacy to contact our office. Our fax number is [phone].  If you have an urgent issue when the clinic is closed that cannot wait until the next business day, you can page your doctor at the number below.    Please note that while we do our best to be available for urgent issues outside of office hours, we are not available 24/7.   If you have an urgent issue and are unable to reach us, you may choose to seek medical care at your doctor's office, retail clinic, urgent care center, or emergency room.  If you have a medical emergency, please immediately call 911 or go to the emergency department.  Pager Numbers  - Dr. Kowalski: [phone]  - Dr. Moye: [phone]  - Dr. Stewart:  [phone]  In the event of inclement weather, please call our main line at [phone] for an update on the status of any delays or closures.  Dermatology Medication Tips: Please keep the boxes that topical medications come in in order to help keep track of the instructions about where and how to use these. Pharmacies typically print the medication instructions only on the boxes and not directly on the medication tubes.   If your medication is too expensive, please contact our office at [phone] option 4 or send us a message through MyChart.   We are unable to tell what your co-pay for medications will be in advance as this is different depending on your insurance coverage. However, we may be able to find a substitute medication at lower cost or fill out paperwork to get insurance to cover a needed medication.   If a prior authorization is required to get your medication covered by your insurance company, please allow us 1-2 business days to complete this process.  Drug prices often vary depending on where the prescription is filled and some pharmacies may offer cheaper prices.  The website www.goodrx.com contains coupons for medications through different pharmacies. The prices here do not account for what the cost may be with help from insurance (it may be cheaper with your insurance), but the website can give you the price if you did not use any insurance.  - You can print the associated coupon and take it with   your prescription to the pharmacy.  - You may also stop by our office during regular business hours and pick up a GoodRx coupon card.  - If you need your prescription sent electronically to a different pharmacy, notify our office through [place] MyChart or by phone at [phone] option 4.     Si Usted Necesita Algo Despus de Su Visita  Tambin puede enviarnos un mensaje a travs de MyChart. Por lo general respondemos a los mensajes de MyChart en el transcurso de 1 a 2  das hbiles.  Para renovar recetas, por favor pida a su farmacia que se ponga en contacto con nuestra oficina. Nuestro nmero de fax es el [phone].  Si tiene un asunto urgente cuando la clnica est cerrada y que no puede esperar hasta el siguiente da hbil, puede llamar/localizar a su doctor(a) al nmero que aparece a continuacin.   Por favor, tenga en cuenta que aunque hacemos todo lo posible para estar disponibles para asuntos urgentes fuera del horario de oficina, no estamos disponibles las 24 horas del da, los 7 das de la semana.   Si tiene un problema urgente y no puede comunicarse con nosotros, puede optar por buscar atencin mdica  en el consultorio de su doctor(a), en una clnica privada, en un centro de atencin urgente o en una sala de emergencias.  Si tiene una emergencia mdica, por favor llame inmediatamente al 911 o vaya a la sala de emergencias.  Nmeros de bper  - Dr. Kowalski: [phone]  - Dra. Moye: [phone]  - Dra. Stewart: [phone]  En caso de inclemencias del tiempo, por favor llame a nuestra lnea principal al [phone] para una actualizacin sobre el estado de cualquier retraso o cierre.  Consejos para la medicacin en dermatologa: Por favor, guarde las cajas en las que vienen los medicamentos de uso tpico para ayudarle a seguir las instrucciones sobre dnde y cmo usarlos. Las farmacias generalmente imprimen las instrucciones del medicamento slo en las cajas y no directamente en los tubos del medicamento.   Si su medicamento es muy caro, por favor, pngase en contacto con nuestra oficina llamando al [phone] y presione la opcin 4 o envenos un mensaje a travs de MyChart.   No podemos decirle cul ser su copago por los medicamentos por adelantado ya que esto es diferente dependiendo de la cobertura de su seguro. Sin embargo, es posible que podamos encontrar un medicamento sustituto a menor costo o llenar un formulario para que el  seguro cubra el medicamento que se considera necesario.   Si se requiere una autorizacin previa para que su compaa de seguros cubra su medicamento, por favor permtanos de 1 a 2 das hbiles para completar este proceso.  Los precios de los medicamentos varan con frecuencia dependiendo del lugar de dnde se surte la receta y alguna farmacias pueden ofrecer precios ms baratos.  El sitio web www.goodrx.com tiene cupones para medicamentos de diferentes farmacias. Los precios aqu no tienen en cuenta lo que podra costar con la ayuda del seguro (puede ser ms barato con su seguro), pero el sitio web puede darle el precio si no utiliz ningn seguro.  - Puede imprimir el cupn correspondiente y llevarlo con su receta a la farmacia.  - Tambin puede pasar por nuestra oficina durante el horario de atencin regular y recoger una tarjeta de cupones de GoodRx.  - Si necesita que su receta se enve electrnicamente a una farmacia diferente, informe a nuestra oficina a travs de MyChart de [place]   o por telfono llamando al [phone] y presione la opcin 4.  

## 2022-08-28 NOTE — Progress Notes (Signed)
   Follow-Up Visit   Subjective  Megan Harrison is a 67 y.o. female who presents for the following: Skin Cancer Screening and Full Body Skin Exam, hx of Dysplastic Nevus The patient presents for Total-Body Skin Exam (TBSE) for skin cancer screening and mole check. The patient has spots, moles and lesions to be evaluated, some may be new or changing and the patient has concerns that these could be cancer.  The following portions of the chart were reviewed this encounter and updated as appropriate: medications, allergies, medical history  Review of Systems:  No other skin or systemic complaints except as noted in HPI or Assessment and Plan.  Objective  Well appearing patient in no apparent distress; mood and affect are within normal limits.  A full examination was performed including scalp, head, eyes, ears, nose, lips, neck, chest, axillae, abdomen, back, buttocks, bilateral upper extremities, bilateral lower extremities, hands, feet, fingers, toes, fingernails, and toenails. All findings within normal limits unless otherwise noted below.   Relevant physical exam findings are noted in the Assessment and Plan.   Assessment & Plan   LENTIGINES, SEBORRHEIC KERATOSES, HEMANGIOMAS - Benign normal skin lesions - Benign-appearing - Call for any changes  MELANOCYTIC NEVI - Tan-brown and/or pink-flesh-colored symmetric macules and papules - Benign appearing on exam today - Observation - Call clinic for new or changing moles - Recommend daily use of broad spectrum spf 30+ sunscreen to sun-exposed areas.   ACTINIC DAMAGE - Chronic condition, secondary to cumulative UV/sun exposure - diffuse scaly erythematous macules with underlying dyspigmentation - Recommend daily broad spectrum sunscreen SPF 30+ to sun-exposed areas, reapply every 2 hours as needed.  - Staying in the shade or wearing long sleeves, sun glasses (UVA+UVB protection) and wide brim hats (4-inch brim around the entire  circumference of the hat) are also recommended for sun protection.  - Call for new or changing lesions.  SKIN CANCER SCREENING PERFORMED TODAY.  HISTORY OF DYSPLASTIC NEVUS No evidence of recurrence today Recommend regular full body skin exams Recommend daily broad spectrum sunscreen SPF 30+ to sun-exposed areas, reapply every 2 hours as needed.  Call if any new or changing lesions are noted between office visits  - L mid back 6.0cm lat to spine  HISTORY OF BREAST CANCER Exam: L breast clear to visual exam, no lymphadenopathy Treatment Plan: Clear, observe   SPICER VEINS - Dilated blue, purple or red veins at the lower extremities - Reassured - Smaller vessels can be treated by sclerotherapy (a procedure to inject a medicine into the veins to make them disappear) if desired, but the treatment is not covered by insurance. Larger vessels may be covered if symptomatic and we would refer to vascular surgeon if treatment desired.   NAIL PROBLEM Associated with changes with chemo for Breast CA Exam: some distal thickening and yellowing of toenails Treatment Plan: Benign appearing May improve with more time.  Return in about 1 year (around 08/28/2023) for TBSE, Hx of Dysplastic nevi.  I, Ardis Rowan, RMA, am acting as scribe for Armida Sans, MD .  Documentation: I have reviewed the above documentation for accuracy and completeness, and I agree with the above.  Armida Sans, MD

## 2022-08-29 ENCOUNTER — Other Ambulatory Visit: Payer: Self-pay

## 2022-09-03 ENCOUNTER — Encounter: Payer: Self-pay | Admitting: Dermatology

## 2022-09-04 ENCOUNTER — Other Ambulatory Visit: Payer: Self-pay

## 2022-09-04 DIAGNOSIS — Z171 Estrogen receptor negative status [ER-]: Secondary | ICD-10-CM

## 2022-09-04 NOTE — Telephone Encounter (Signed)
Error

## 2022-10-03 ENCOUNTER — Ambulatory Visit
Admission: RE | Admit: 2022-10-03 | Discharge: 2022-10-03 | Disposition: A | Discharge status: Home / Self Care | Payer: Medicare PPO | Source: Ambulatory Visit | Attending: Surgery | Admitting: Surgery

## 2022-10-03 DIAGNOSIS — Z171 Estrogen receptor negative status [ER-]: Secondary | ICD-10-CM | POA: Diagnosis not present

## 2022-10-03 DIAGNOSIS — R92323 Mammographic fibroglandular density, bilateral breasts: Secondary | ICD-10-CM | POA: Diagnosis not present

## 2022-10-03 DIAGNOSIS — C50412 Malignant neoplasm of upper-outer quadrant of left female breast: Secondary | ICD-10-CM | POA: Insufficient documentation

## 2022-10-09 ENCOUNTER — Encounter: Payer: Self-pay | Admitting: Surgery

## 2022-10-09 ENCOUNTER — Ambulatory Visit: Payer: Medicare PPO | Admitting: Surgery

## 2022-10-09 VITALS — BP 154/80 | HR 61 | Temp 98.4°F | Ht 63.5 in | Wt 174.4 lb

## 2022-10-09 DIAGNOSIS — Z171 Estrogen receptor negative status [ER-]: Secondary | ICD-10-CM

## 2022-10-09 DIAGNOSIS — C50412 Malignant neoplasm of upper-outer quadrant of left female breast: Secondary | ICD-10-CM | POA: Diagnosis not present

## 2022-10-09 NOTE — Progress Notes (Signed)
10/09/2022  History of Present Illness: Megan Harrison is a 67 y.o. female s/p left breast lumpectomy and SLNBx on 11/20/21 for triple negative breast cancer.  She had a port placement on 12/05/21 for chemotherapy, but she developed DVT related to it and was removed by Dr. Wyn Quaker on 02/28/22.  She presents today for follow up.  She reports that she's been having intermittent pain in the left breast in different areas, in the axilla at the site of the lymph node biopsy, and around the right port-a-cath site.  She can feel the heaviness of the breast at the end of the day, and the discomfort is less at the beginning of the day.  Denies any skin changes or feeling any specific masses.  Past Medical History: Past Medical History:  Diagnosis Date   Anemia    Cutaneous sarcoidosis    Degenerative disc disease    Diverticulosis    Dysplastic nevus 11/19/2017   L mid back 6.0 cm lat to spine   Foot pain    GERD (gastroesophageal reflux disease)    Hyperlipidemia    Malignant neoplasm of upper-outer quadrant of left breast in female, estrogen receptor negative (HCC) 11/04/2021   Migraine    Shingles    eyes     Past Surgical History: Past Surgical History:  Procedure Laterality Date   2D Echo  1999   Negative   ABDOMINAL HYSTERECTOMY  03/1998   APPENDECTOMY     Arm lesion, cutaneous sarcoid  12/2005   with normal CXR and ANA   BREAST BIOPSY Left 10/23/2021   US biopsy/ heart clip/ [facility]   BREAST LUMPECTOMY Left 2023   BREAST LUMPECTOMY WITH RADIOFREQUENCY TAG IDENTIFICATION Left 11/13/2021   lt br rf tag placement   BREAST LUMPECTOMY,RADIO FREQ LOCALIZER,AXILLARY SENTINEL LYMPH NODE BIOPSY Left 11/20/2021   Procedure: BREAST LUMPECTOMY,RADIO FREQ LOCALIZER,AXILLARY SENTINEL LYMPH NODE BIOPSY;  Surgeon: Henrene Dodge, MD;  Location: [facility] ORS;  Service: General;  Laterality: Left;   CESAREAN SECTION     x2   COLONOSCOPY  11/0/2009,08/18/13   diverticulosis and hemorrhoids   DIAGNOSTIC  LAPAROSCOPY     EYE SURGERY  10/13/2018   had shingles in left eye, had to have surgery, has contact lens in now.   FOOT NEUROMA SURGERY Left    LAPAROSCOPIC APPENDECTOMY N/A 02/18/2016   Procedure: APPENDECTOMY LAPAROSCOPIC;  Surgeon: Axel Filler, MD;  Location: [facility] OR;  Service: General;  Laterality: N/A;   LUMBAR LAMINECTOMY  10/2017   PERIPHERAL VASCULAR THROMBECTOMY Right 01/16/2022   Procedure: PERIPHERAL VASCULAR THROMBECTOMY;  Surgeon: Annice Needy, MD;  Location: [facility];  Service: Cardiovascular;  Laterality: Right;   PORTA CATH REMOVAL N/A 02/28/2022   Procedure: PORTA CATH REMOVAL;  Surgeon: Annice Needy, MD;  Location: [facility];  Service: Cardiovascular;  Laterality: N/A;   PORTACATH PLACEMENT Right 12/05/2021   Procedure: INSERTION PORT-A-CATH;  Surgeon: Henrene Dodge, MD;  Location: [facility] ORS;  Service: General;  Laterality: Right;   ROTATOR CUFF REPAIR Left    TONSILLECTOMY     UPPER GASTROINTESTINAL ENDOSCOPY  11/15/2010   non-erosive gastritis, H. pylori negative    Home Medications: Prior to Admission medications   Medication Sig Start Date End Date Taking? Authorizing Provider  acetaminophen (TYLENOL) 500 MG tablet Take 2 tablets (1,000 mg total) by mouth every 6 (six) hours as needed for mild pain. 11/20/21  Yes Jermanie Minshall, Elita Quick, MD  aspirin EC 81 MG tablet Take 81 mg by mouth  daily. Swallow whole.   Yes [provider]  atorvastatin (LIPITOR) 10 MG tablet Take 1 tablet by mouth once daily 07/01/22  Yes Tower, Audrie Gallus, MD  calcium carbonate (TUMS - DOSED IN MG ELEMENTAL CALCIUM) 500 MG chewable tablet Chew 1 tablet by mouth as needed for indigestion or heartburn.   Yes [provider]  Cholecalciferol (VITAMIN D3) 50 MCG (2000 UT) TABS Take 1 tablet by mouth daily.   Yes [provider]  ferrous sulfate 325 (65 FE) MG tablet Take 325 mg by mouth daily.   Yes [provider]  fluticasone (FLONASE) 50 MCG/ACT  nasal spray USE 1 SPRAY(S) IN EACH NOSTRIL ONCE DAILY AS DIRECTED 08/12/22  Yes Tower, Audrie Gallus, MD  Magnesium Hydroxide (DULCOLAX PO) Take 1 tablet by mouth 2 (two) times daily.   Yes [provider]  meloxicam (MOBIC) 15 MG tablet TAKE 1 TABLET BY MOUTH ONCE DAILY AS NEEDED FOR PAIN 05/07/22  Yes Tower, Audrie Gallus, MD  Multiple Vitamins-Minerals (ONE-A-DAY WOMENS 50 PLUS PO) Take 1 tablet by mouth daily.   Yes [provider]  OVER THE COUNTER MEDICATION Cultrelle probiotic   Yes [provider]  prednisoLONE acetate (PRED FORTE) 1 % ophthalmic suspension Place 1 drop into the left eye every other day.   Yes [provider]  Timolol Maleate 0.5 % (DAILY) SOLN Place 1 drop into the left eye daily at 6 (six) AM. 08/16/16  Yes [provider]    Allergies: Allergies  Allergen Reactions   Codeine     REACTION: syncope   Sumatriptan     REACTION: choking sensation    Review of Systems: Review of Systems  Constitutional:  Negative for chills and fever.  Respiratory:  Negative for shortness of breath.   Cardiovascular:  Negative for chest pain.  Gastrointestinal:  Negative for abdominal pain, nausea and vomiting.  Skin:        Discomfort left breast and axilla    Physical Exam BP (!) 154/80   Pulse 61   Temp 98.4 F (36.9 C) (Oral)   Ht 5' 3.5" (1.613 m)   Wt 174 lb 6.4 oz (79.1 kg)   SpO2 97%   BMI 30.41 kg/m  CONSTITUTIONAL: No acute distress, well nourished. HEENT:  Normocephalic, atraumatic, extraocular motion intact. RESPIRATORY:  Normal respiratory effort without pathologic use of accessory muscles. CARDIOVASCULAR: Regular rhythm and rate BREAST:  Left breast s/p lumpectomy with scar in upper outer quadrant well healed, with some palpable firmness consistent with scar tissue.  No other palpable masses or skin changes, but she does have some tenderness in the medial part of the left breast.  Left axilla without any lymphadenopathy, but  also with some discomfort over the incision.  No masses or firmness there.  Right breast without any palpable masses, skin changes, or nipple changes.  No right axillary lymphadenopathy. NEUROLOGIC:  Motor and sensation is grossly normal.  Cranial nerves are grossly intact. PSYCH:  Alert and oriented to person, place and time. Affect is normal.  Labs/Imaging: Mammogram 10/03/22: FINDINGS: Post operative changes are seen in the LEFTbreast. No suspicious mass, distortion, or microcalcifications are identified to suggest presence of malignancy.   IMPRESSION: No mammographic evidence for malignancy.   RECOMMENDATION: Diagnostic mammogram is suggested in 1 year. (Code:DM-B-01Y)   I have discussed the findings and recommendations with the patient. If applicable, a reminder letter will be sent to the patient regarding the next appointment.   BI-RADS CATEGORY  2: Benign.  Assessment and Plan: This is a 67 y.o. female s/p left breast lumpectomy and SLNBx.  --Patient is near about 10-11 months out from her surgery.  Discussed with her that the discomfort that she's having is likely related to scar tissue that has developed after surgery and after radiation.  These may improve with time but may also persist.  She could try applying vitamin E lotion or cocoa butter lotion to the left breast and axilla to see if that can help with the scar tissue/firmness.  Could potentially take evening primrose oil, but I think this would be more helpful for mastalgia/physiological type of pain instead.  Also recommended that a better fitting or supporting bra may be helpful so that the weight of the breast is not pulling so much against the scar tissue. --Follow up in 6 months.  I spent 20 minutes dedicated to the care of this patient on the date of this encounter to include pre-visit review of records, face-to-face time with the patient discussing diagnosis and management, and any post-visit coordination of  care.   Howie Ill, MD [facility]

## 2022-10-09 NOTE — Patient Instructions (Addendum)
You may try cocoa butter, vitamin E lotion, or primrose pill to help with scarring.  If you have any concerns or questions, please feel free to call our office. Follow up in 6 months.  Breast Self-Awareness Breast self-awareness is knowing how your breasts look and feel. You need to: Check your breasts on a regular basis. Tell your doctor about any changes. Become familiar with the look and feel of your breasts. This can help you catch a breast problem while it is still small and can be treated. You should do breast self-exams even if you have breast implants. What you need: A mirror. A well-lit room. A pillow or other soft object. How to do a breast self-exam Follow these steps to do a breast self-exam: Look for changes  Take off all the clothes above your waist. Stand in front of a mirror in a room with good lighting. Put your hands down at your sides. Compare your breasts in the mirror. Look for any difference between them, such as: A difference in shape. A difference in size. Wrinkles, dips, and bumps in one breast and not the other. Look at each breast for changes in the skin, such as: Redness. Scaly areas. Skin that has gotten thicker. Dimpling. Open sores (ulcers). Look for changes in your nipples, such as: Fluid coming out of a nipple. Fluid around a nipple. Bleeding. Dimpling. Redness. A nipple that looks pushed in (retracted), or that has changed position. Feel for changes Lie on your back. Feel each breast. To do this: Pick a breast to feel. Place a pillow under the shoulder closest to that breast. Put the arm closest to that breast behind your head. Feel the nipple area of that breast using the hand of your other arm. Feel the area with the pads of your three middle fingers by making small circles with your fingers. Use light, medium, and firm pressure. Continue the overlapping circles, moving downward over the breast. Keep making circles with your fingers. Stop  when you feel your ribs. Start making circles with your fingers again, this time going upward until you reach your collarbone. Then, make circles outward across your breast and into your armpit area. Squeeze your nipple. Check for discharge and lumps. Repeat these steps to check your other breast. Sit or stand in the tub or shower. With soapy water on your skin, feel each breast the same way you did when you were lying down. Write down what you find Writing down what you find can help you remember what to tell your doctor. Write down: What is normal for each breast. Any changes you find in each breast. These include: The kind of changes you find. A tender or painful breast. Any lump you find. Write down its size and where it is. When you last had your monthly period (menstrual cycle). General tips If you are breastfeeding, the best time to check your breasts is after you feed your baby or after you use a breast pump. If you get monthly bleeding, the best time to check your breasts is 5-7 days after your monthly cycle ends. With time, you will become comfortable with the self-exam. You will also start to know if there are changes in your breasts. Contact a doctor if: You see a change in the shape or size of your breasts or nipples. You see a change in the skin of your breast or nipples, such as red or scaly skin. You have fluid coming from your nipples that is  not normal. You find a new lump or thick area. You have breast pain. You have any concerns about your breast health. Summary Breast self-awareness includes looking for changes in your breasts and feeling for changes within your breasts. You should do breast self-awareness in front of a mirror in a well-lit room. If you get monthly periods (menstrual cycles), the best time to check your breasts is 5-7 days after your period ends. Tell your doctor about any changes you see in your breasts. Changes include changes in size, changes on  the skin, painful or tender breasts, or fluid from your nipples that is not normal. This information is not intended to replace advice given to you by your health care provider. Make sure you discuss any questions you have with your health care provider. Document Revised: 09/27/2021 Document Reviewed: 02/22/2021 Elsevier Patient Education  2024 [employer].

## 2022-10-21 ENCOUNTER — Encounter: Payer: Self-pay | Admitting: Certified Nurse Midwife

## 2022-10-23 ENCOUNTER — Other Ambulatory Visit (HOSPITAL_COMMUNITY)
Admission: RE | Admit: 2022-10-23 | Discharge: 2022-10-23 | Disposition: A | Discharge status: Home / Self Care | Payer: Medicare PPO | Source: Ambulatory Visit | Attending: Certified Nurse Midwife | Admitting: Certified Nurse Midwife

## 2022-10-23 ENCOUNTER — Ambulatory Visit (INDEPENDENT_AMBULATORY_CARE_PROVIDER_SITE_OTHER): Payer: Medicare PPO

## 2022-10-23 VITALS — BP 140/80 | Ht 63.5 in | Wt 176.0 lb

## 2022-10-23 DIAGNOSIS — R300 Dysuria: Secondary | ICD-10-CM | POA: Diagnosis not present

## 2022-10-23 DIAGNOSIS — Z8742 Personal history of other diseases of the female genital tract: Secondary | ICD-10-CM

## 2022-10-23 DIAGNOSIS — L292 Pruritus vulvae: Secondary | ICD-10-CM

## 2022-10-23 MED ORDER — FLUCONAZOLE 150 MG PO TABS
150.0000 mg | ORAL_TABLET | Freq: Every day | ORAL | 0 refills | Status: DC
Start: 2022-10-23 — End: 2022-12-18

## 2022-10-23 NOTE — Patient Instructions (Signed)

## 2022-10-23 NOTE — Progress Notes (Addendum)
    NURSE VISIT NOTE  Subjective:    Patient ID: Megan Harrison, female    DOB: 10-11-1955, 67 y.o.   MRN: [id number]  HPI  Patient is a 67 y.o. No obstetric history on file. female who presents for Vaginal Itching and  dysuria for 2 week(s). Denies abnormal vaginal bleeding or significant pelvic pain or fever. admits to dysuria. Patient denies history of known exposure to STD.   Objective:    BP (!) 140/80   Ht 5' 3.5" (1.613 m)   Wt 176 lb (79.8 kg)   BMI 30.69 kg/m      Assessment:  History of vaginitis    Plan:   GC and chlamydia DNA  probe sent to lab. Treatment: Diflucan sent to pharmacy ROV prn if symptoms persist or worsen.   Cornelius Moras, CMA

## 2022-10-24 ENCOUNTER — Encounter: Payer: Self-pay | Admitting: Certified Nurse Midwife

## 2022-10-24 LAB — CERVICOVAGINAL ANCILLARY ONLY
Bacterial Vaginitis (gardnerella): NEGATIVE
Candida Glabrata: NEGATIVE
Candida Vaginitis: NEGATIVE
Chlamydia: NEGATIVE
Comment: NEGATIVE
Comment: NEGATIVE
Comment: NEGATIVE
Comment: NEGATIVE
Comment: NEGATIVE
Comment: NORMAL
Neisseria Gonorrhea: NEGATIVE
Trichomonas: NEGATIVE

## 2022-10-29 ENCOUNTER — Other Ambulatory Visit: Payer: Self-pay | Admitting: Family Medicine

## 2022-10-31 DIAGNOSIS — Z8742 Personal history of other diseases of the female genital tract: Secondary | ICD-10-CM | POA: Insufficient documentation

## 2022-11-06 DIAGNOSIS — H18422 Band keratopathy, left eye: Secondary | ICD-10-CM | POA: Diagnosis not present

## 2022-11-06 DIAGNOSIS — H4062X1 Glaucoma secondary to drugs, left eye, mild stage: Secondary | ICD-10-CM | POA: Diagnosis not present

## 2022-12-02 ENCOUNTER — Encounter: Payer: Self-pay | Admitting: Certified Nurse Midwife

## 2022-12-02 ENCOUNTER — Encounter: Payer: Self-pay | Admitting: Oncology

## 2022-12-02 ENCOUNTER — Inpatient Hospital Stay: Payer: Medicare PPO | Attending: Oncology | Admitting: Oncology

## 2022-12-02 VITALS — BP 135/81 | HR 65 | Temp 98.7°F | Resp 18 | Ht 63.5 in | Wt 175.1 lb

## 2022-12-02 DIAGNOSIS — Z83719 Family history of colon polyps, unspecified: Secondary | ICD-10-CM | POA: Diagnosis not present

## 2022-12-02 DIAGNOSIS — Z171 Estrogen receptor negative status [ER-]: Secondary | ICD-10-CM | POA: Diagnosis not present

## 2022-12-02 DIAGNOSIS — Z800 Family history of malignant neoplasm of digestive organs: Secondary | ICD-10-CM | POA: Insufficient documentation

## 2022-12-02 DIAGNOSIS — C50912 Malignant neoplasm of unspecified site of left female breast: Secondary | ICD-10-CM | POA: Insufficient documentation

## 2022-12-02 DIAGNOSIS — Z08 Encounter for follow-up examination after completed treatment for malignant neoplasm: Secondary | ICD-10-CM

## 2022-12-02 DIAGNOSIS — Z853 Personal history of malignant neoplasm of breast: Secondary | ICD-10-CM | POA: Diagnosis not present

## 2022-12-02 DIAGNOSIS — Z923 Personal history of irradiation: Secondary | ICD-10-CM | POA: Diagnosis not present

## 2022-12-02 DIAGNOSIS — Z801 Family history of malignant neoplasm of trachea, bronchus and lung: Secondary | ICD-10-CM | POA: Insufficient documentation

## 2022-12-02 DIAGNOSIS — Z9221 Personal history of antineoplastic chemotherapy: Secondary | ICD-10-CM | POA: Diagnosis not present

## 2022-12-02 DIAGNOSIS — Z79899 Other long term (current) drug therapy: Secondary | ICD-10-CM | POA: Diagnosis not present

## 2022-12-02 NOTE — Progress Notes (Signed)
Hematology/Oncology Consult note [facility]  Telephone:([phone] [phone]  Patient Care Team: Tower, Audrie Gallus, MD as PCP - General Hulen Luster, RN as Oncology Nurse Navigator Creig Hines, MD as Consulting Physician (Oncology) Carmina Miller, MD as Consulting Physician (Radiation Oncology) Annice Needy, MD as Referring Physician (Vascular Surgery) Henrene Dodge, MD as Consulting Physician (General Surgery)   Name of the patient: Megan Harrison  [id number]  1956-03-19   Date of visit: 12/02/22  Diagnosis- pathological prognostic stage Ia invasive mammary carcinoma of the left breast pT1b N0 M0 ER/PR HER2 negative     Chief complaint/ Reason for visit-routine surveillance visit for breast cancer  Heme/Onc history: Patient is a 67 year old female who underwent a screening bilateral mammogram in May 2023 which showed a possible asymmetry in the left breast.  This was followed by diagnostic mammogram and ultrasound which showed a hypoechoic mass in the left breast 3 x 4 x 6 mm.  No abnormality noted in the left axillary lymph nodes.  This was biopsied and was consistent with invasive mammary carcinoma 4 mm grade 3 ER negative less than 1%, PR negative less than 1% and HER2 negative.   Final pathology showed 8 mm grade 3 triple negative tumor with negative margins.3 sentinel lymph nodes negative for malignancy.  Plan is for adjuvant TC chemotherapy x4 cycles followed by radiation   Patient on to have extensive right upper extremity DVT related to port placement after 2 cycles of chemotherapy.  Port was taken out after completing chemotherapy.  Patient completed 3 months of Eliquis and stopped it    Patient completed 4 cycles of adjuvant TC chemotherapy on 02/13/2022.  She also completed adjuvant radiation therapy  Interval history-patient has been having some symptoms of vaginal dryness as well as on and off yeast infections and would like to know  if it would be okay to use vaginal estrogen.  Denies any breast concerns at this time.  Denies any new aches and pains anywhere  ECOG PS- 1 Pain scale- 0   Review of systems- Review of Systems  Constitutional:  Negative for chills, fever, malaise/fatigue and weight loss.  HENT:  Negative for congestion, ear discharge and nosebleeds.   Eyes:  Negative for blurred vision.  Respiratory:  Negative for cough, hemoptysis, sputum production, shortness of breath and wheezing.   Cardiovascular:  Negative for chest pain, palpitations, orthopnea and claudication.  Gastrointestinal:  Negative for abdominal pain, blood in stool, constipation, diarrhea, heartburn, melena, nausea and vomiting.  Genitourinary:  Negative for dysuria, flank pain, frequency, hematuria and urgency.  Musculoskeletal:  Negative for back pain, joint pain and myalgias.  Skin:  Negative for rash.  Neurological:  Negative for dizziness, tingling, focal weakness, seizures, weakness and headaches.  Endo/Heme/Allergies:  Does not bruise/bleed easily.  Psychiatric/Behavioral:  Negative for depression and suicidal ideas. The patient does not have insomnia.       Allergies  Allergen Reactions   Codeine     REACTION: syncope   Sumatriptan     REACTION: choking sensation     Past Medical History:  Diagnosis Date   Anemia    Cutaneous sarcoidosis    Degenerative disc disease    Diverticulosis    Dysplastic nevus 11/19/2017   L mid back 6.0 cm lat to spine   Foot pain    GERD (gastroesophageal reflux disease)    Hyperlipidemia    Malignant neoplasm of upper-outer quadrant of left breast in female,  estrogen receptor negative (HCC) 11/04/2021   Migraine    Shingles    eyes     Past Surgical History:  Procedure Laterality Date   2D Echo  1999   Negative   ABDOMINAL HYSTERECTOMY  03/1998   APPENDECTOMY     Arm lesion, cutaneous sarcoid  12/2005   with normal CXR and ANA   BREAST BIOPSY Left 10/23/2021   [facility] biopsy/  heart clip/ [facility]   BREAST LUMPECTOMY Left 2023   BREAST LUMPECTOMY WITH RADIOFREQUENCY TAG IDENTIFICATION Left 11/13/2021   lt br rf tag placement   BREAST LUMPECTOMY,RADIO FREQ LOCALIZER,AXILLARY SENTINEL LYMPH NODE BIOPSY Left 11/20/2021   Procedure: BREAST LUMPECTOMY,RADIO FREQ LOCALIZER,AXILLARY SENTINEL LYMPH NODE BIOPSY;  Surgeon: Henrene Dodge, MD;  Location: [facility] ORS;  Service: General;  Laterality: Left;   CESAREAN SECTION     x2   COLONOSCOPY  11/0/2009,08/18/13   diverticulosis and hemorrhoids   DIAGNOSTIC LAPAROSCOPY     EYE SURGERY  10/13/2018   had shingles in left eye, had to have surgery, has contact lens in now.   FOOT NEUROMA SURGERY Left    LAPAROSCOPIC APPENDECTOMY N/A 02/18/2016   Procedure: APPENDECTOMY LAPAROSCOPIC;  Surgeon: Axel Filler, MD;  Location: [facility] OR;  Service: General;  Laterality: N/A;   LUMBAR LAMINECTOMY  10/2017   PERIPHERAL VASCULAR THROMBECTOMY Right 01/16/2022   Procedure: PERIPHERAL VASCULAR THROMBECTOMY;  Surgeon: Annice Needy, MD;  Location: [facility] INVASIVE CV [facility];  Service: Cardiovascular;  Laterality: Right;   PORTA CATH REMOVAL N/A 02/28/2022   Procedure: PORTA CATH REMOVAL;  Surgeon: Annice Needy, MD;  Location: [facility];  Service: Cardiovascular;  Laterality: N/A;   PORTACATH PLACEMENT Right 12/05/2021   Procedure: INSERTION PORT-A-CATH;  Surgeon: Henrene Dodge, MD;  Location: [facility] ORS;  Service: General;  Laterality: Right;   ROTATOR CUFF REPAIR Left    TONSILLECTOMY     UPPER GASTROINTESTINAL ENDOSCOPY  11/15/2010   non-erosive gastritis, H. pylori negative    Social History   Socioeconomic History   Marital status: Married    Spouse name: Not on file   Number of children: Not on file   Years of education: Not on file   Highest education level: Not on file  Occupational History   Occupation: Works for [employer]    Employer: RETIRED  Tobacco Use   Smoking status: Never   Smokeless tobacco: Never  Vaping Use    Vaping status: Never Used  Substance and Sexual Activity   Alcohol use: No    Alcohol/week: 0.0 standard drinks of alcohol   Drug use: No   Sexual activity: Yes  Other Topics Concern   Not on file  Social History Narrative   Regular exercise:  Curves, elliptical, walking.   Social Determinants of Health   Financial Resource Strain: Low Risk  (04/05/2022)   Overall Financial Resource Strain (CARDIA)    Difficulty of Paying Living Expenses: Not hard at all  Food Insecurity: No Food Insecurity (04/05/2022)   Hunger Vital Sign    Worried About Running Out of Food in the Last Year: Never true    Ran Out of Food in the Last Year: Never true  Transportation Needs: No Transportation Needs (04/05/2022)   PRAPARE - Administrator, Civil Service (Medical): No    Lack of Transportation (Non-Medical): No  Physical Activity: Insufficiently Active (02/01/2020)   Exercise Vital Sign    Days of Exercise per Week: 4 days    Minutes of Exercise  per Session: 30 min  Stress: No Stress Concern Present (04/05/2022)   [employer] of Occupational Health - Occupational Stress Questionnaire    Feeling of Stress : Not at all  Social Connections: Socially Integrated (04/05/2022)   Social Connection and Isolation Panel [NHANES]    Frequency of Communication with Friends and Family: More than three times a week    Frequency of Social Gatherings with Friends and Family: More than three times a week    Attends Religious Services: More than 4 times per year    Active Member of [employer] or Organizations: Yes    Attends Engineer, structural: More than 4 times per year    Marital Status: Married  Catering manager Violence: Not At Risk (04/05/2022)   Humiliation, Afraid, Rape, and Kick questionnaire    Fear of Current or Ex-Partner: No    Emotionally Abused: No    Physically Abused: No    Sexually Abused: No    Family History  Problem Relation Age of Onset   Colon cancer Brother         early 69's   Stomach cancer Brother    Heart disease Mother        CAD   Alcohol abuse Mother    Lung cancer Father    Heart disease Brother        CAD   Throat cancer Brother    Heart disease Brother        CAD, pre diabetic, obese   Colon polyps Sister    Breast cancer Neg Hx    Rectal cancer Neg Hx      Current Outpatient Medications:    acetaminophen (TYLENOL) 500 MG tablet, Take 2 tablets (1,000 mg total) by mouth every 6 (six) hours as needed for mild pain., Disp: , Rfl:    aspirin EC 81 MG tablet, Take 81 mg by mouth daily. Swallow whole., Disp: , Rfl:    atorvastatin (LIPITOR) 10 MG tablet, Take 1 tablet by mouth once daily, Disp: 90 tablet, Rfl: 3   calcium carbonate (TUMS - DOSED IN MG ELEMENTAL CALCIUM) 500 MG chewable tablet, Chew 1 tablet by mouth as needed for indigestion or heartburn., Disp: , Rfl:    Cholecalciferol (VITAMIN D3) 50 MCG (2000 UT) TABS, Take 1 tablet by mouth daily., Disp: , Rfl:    ferrous sulfate 325 (65 FE) MG tablet, Take 325 mg by mouth daily., Disp: , Rfl:    fluconazole (DIFLUCAN) 150 MG tablet, Take 1 tablet (150 mg total) by mouth daily., Disp: 1 tablet, Rfl: 0   fluticasone (FLONASE) 50 MCG/ACT nasal spray, USE 1 SPRAY(S) IN EACH NOSTRIL ONCE DAILY AS DIRECTED, Disp: 48 g, Rfl: 1   Magnesium Hydroxide (DULCOLAX PO), Take 1 tablet by mouth 2 (two) times daily., Disp: , Rfl:    meloxicam (MOBIC) 15 MG tablet, Take 1 tablet (15 mg total) by mouth daily. OFFICE VISIT NEEDED FOR FURTHER REFILLS, Disp: 90 tablet, Rfl: 0   Multiple Vitamins-Minerals (ONE-A-DAY WOMENS 50 PLUS PO), Take 1 tablet by mouth daily., Disp: , Rfl:    OVER THE COUNTER MEDICATION, Cultrelle probiotic, Disp: , Rfl:    prednisoLONE acetate (PRED FORTE) 1 % ophthalmic suspension, Place 1 drop into the left eye every other day., Disp: , Rfl:    Timolol Maleate 0.5 % (DAILY) SOLN, Place 1 drop into the left eye daily at 6 (six) AM., Disp: , Rfl:  No current facility-administered  medications for this visit.  Facility-Administered Medications Ordered  in Other Visits:    heparin lock flush 100 UNIT/ML injection, , , ,    palonosetron (ALOXI) 0.25 MG/5ML injection, , , ,   Physical exam:  Vitals:   12/02/22 0944  BP: 135/81  Pulse: 65  Resp: 18  Temp: 98.7 F (37.1 C)  TempSrc: Tympanic  SpO2: 99%  Weight: 175 lb 1.6 oz (79.4 kg)  Height: 5' 3.5" (1.613 m)   Physical Exam Cardiovascular:     Rate and Rhythm: Normal rate and regular rhythm.     Heart sounds: Normal heart sounds.  Pulmonary:     Effort: Pulmonary effort is normal.  Skin:    General: Skin is warm and dry.  Neurological:     Mental Status: She is alert and oriented to person, place, and time.    Breast exam was performed in seated and lying down position. Patient is status post left lumpectomy with a well-healed surgical scar. No evidence of any palpable masses. No evidence of axillary adenopathy. No evidence of any palpable masses or lumps in the right breast. No evidence of right axillary adenopathy      Latest Ref Rng & Units 04/11/2022    9:55 AM  CMP  Glucose 70 - 99 mg/dL 95   BUN 6 - 23 mg/dL 15   Creatinine 1.61 - 1.20 mg/dL 0.96   Sodium 045 - 409 mEq/L 143   Potassium 3.5 - 5.1 mEq/L 4.1   Chloride 96 - 112 mEq/L 107   CO2 19 - 32 mEq/L 29   Calcium 8.4 - 10.5 mg/dL 9.1   Total Protein 6.0 - 8.3 g/dL 6.3   Total Bilirubin 0.2 - 1.2 mg/dL 0.3   Alkaline Phos 39 - 117 U/L 60   AST 0 - 37 U/L 16   ALT 0 - 35 U/L 11       Latest Ref Rng & Units 04/11/2022    9:55 AM  CBC  WBC 4.0 - 10.5 K/uL 5.1   Hemoglobin 12.0 - 15.0 g/dL 81.1   Hematocrit 91.4 - 46.0 % 33.5   Platelets 150.0 - 400.0 K/uL 314.0      Assessment and plan- Patient is a 67 y.o. female with stage I triple negative left breast cancer pT1b N0 M0.  She is s/p 4 cycles of adjuvant TC chemotherapy and radiation therapy.  This is a routine surveillance visit  Clinically patient is doing well with no  concerning signs and symptoms of recurrence based on today's exam.  Given that she had triple negative breast cancer and is dealing with symptoms of vaginal dryness as well as yeast infections I think it would be okay to proceed with topical estrogen as per urology.  There is a small unpredictable estrogen absorption systemically which should not affect triple negative breast cancer recurrence risk.   Visit Diagnosis 1. Encounter for follow-up surveillance of breast cancer      Dr. Owens Shark, MD, MPH [facility] at [facility] [phone] 12/02/2022 12:59 PM

## 2022-12-03 MED ORDER — ESTRADIOL 0.1 MG/GM VA CREA: 1.0000 | TOPICAL_CREAM | Freq: Every day | VAGINAL | 12 refills | Status: AC

## 2022-12-12 ENCOUNTER — Ambulatory Visit
Admission: RE | Admit: 2022-12-12 | Discharge: 2022-12-12 | Disposition: A | Discharge status: Home / Self Care | Payer: Medicare PPO | Source: Ambulatory Visit | Attending: Radiation Oncology | Admitting: Radiation Oncology

## 2022-12-12 ENCOUNTER — Encounter: Payer: Self-pay | Admitting: Radiation Oncology

## 2022-12-12 VITALS — BP 142/81 | HR 61 | Temp 96.6°F | Resp 14 | Ht 63.5 in | Wt 177.4 lb

## 2022-12-12 DIAGNOSIS — Z171 Estrogen receptor negative status [ER-]: Secondary | ICD-10-CM

## 2022-12-12 DIAGNOSIS — Z853 Personal history of malignant neoplasm of breast: Secondary | ICD-10-CM | POA: Diagnosis not present

## 2022-12-12 DIAGNOSIS — Z170 Estrogen receptor positive status [ER+]: Secondary | ICD-10-CM | POA: Diagnosis not present

## 2022-12-12 DIAGNOSIS — Z923 Personal history of irradiation: Secondary | ICD-10-CM | POA: Diagnosis not present

## 2022-12-12 DIAGNOSIS — C50412 Malignant neoplasm of upper-outer quadrant of left female breast: Secondary | ICD-10-CM | POA: Diagnosis not present

## 2022-12-12 NOTE — Progress Notes (Signed)
Radiation Oncology Follow up Note  Name: Megan Harrison   Date:   12/12/2022 MRN:  [id number] DOB: Feb 16, 1956    This 67 y.o. female presents to the clinic today for 17-month follow-up status post whole breast radiation to her left breast for stage Ia triple negative breast cancer.  REFERRING PROVIDER: Tower, Audrie Gallus, MD  HPI: Patient is a 68 year old female now about 7 months having completed whole breast radiation to her left breast for stage Ia triple negative invasive mammary carcinoma.  Seen today in routine follow-up she is doing well she specifically denies breast tenderness cough or bone pain...  Had a mammogram back in May which I reviewed was BI-RADS 2 benign.  She is not on endocrine therapy based on the triple negative nature of her disease.  COMPLICATIONS OF TREATMENT: none  FOLLOW UP COMPLIANCE: keeps appointments   PHYSICAL EXAM:  BP (!) 142/81   Pulse 61   Temp (!) 96.6 F (35.9 C)   Resp 14   Ht 5' 3.5" (1.613 m)   Wt 177 lb 6.4 oz (80.5 kg)   BMI 30.93 kg/m  Lungs are clear to A&P cardiac examination essentially unremarkable with regular rate and rhythm. No dominant mass or nodularity is noted in either breast in 2 positions examined. Incision is well-healed. No axillary or supraclavicular adenopathy is appreciated. Cosmetic result is excellent.  Well-developed well-nourished patient in NAD. HEENT reveals PERLA, EOMI, discs not visualized.  Oral cavity is clear. No oral mucosal lesions are identified. Neck is clear without evidence of cervical or supraclavicular adenopathy. Lungs are clear to A&P. Cardiac examination is essentially unremarkable with regular rate and rhythm without murmur rub or thrill. Abdomen is benign with no organomegaly or masses noted. Motor sensory and DTR levels are equal and symmetric in the upper and lower extremities. Cranial nerves II through XII are grossly intact. Proprioception is intact. No peripheral adenopathy or edema is identified. No  motor or sensory levels are noted. Crude visual fields are within normal range.  RADIOLOGY RESULTS: Mammograms reviewed compatible with above-stated findings  PLAN: Patient is now out 7 months from whole breast radiation doing well.  She has no evidence of disease.  And pleased with her overall progress of asked to see her back in 6 months and will start once year follow-up visits.  Patient knows to call with any concerns.  I would like to take this opportunity to thank you for allowing me to participate in the care of your patient.Carmina Miller, MD

## 2022-12-18 ENCOUNTER — Encounter: Payer: Self-pay | Admitting: Podiatry

## 2022-12-18 ENCOUNTER — Ambulatory Visit: Payer: Medicare PPO | Admitting: Podiatry

## 2022-12-18 DIAGNOSIS — L600 Ingrowing nail: Secondary | ICD-10-CM | POA: Diagnosis not present

## 2022-12-18 DIAGNOSIS — L608 Other nail disorders: Secondary | ICD-10-CM | POA: Diagnosis not present

## 2022-12-18 DIAGNOSIS — L603 Nail dystrophy: Secondary | ICD-10-CM | POA: Diagnosis not present

## 2022-12-18 DIAGNOSIS — A499 Bacterial infection, unspecified: Secondary | ICD-10-CM | POA: Diagnosis not present

## 2022-12-18 MED ORDER — NEOMYCIN-POLYMYXIN-HC 1 % OT SOLN: OTIC | 1 refills | Status: DC

## 2022-12-18 NOTE — Patient Instructions (Signed)

## 2022-12-18 NOTE — Progress Notes (Signed)
She presents today for a chief complaint of a painful ingrown toenail tibial border of the hallux left.  She is also concerned that something is wrong with the hallux nail plate on the right foot is thick and dark and appears to be loose from the toe itself.  She developed breast cancer last May underwent surgery radiation and chemotherapy.  She was at the dermatologist who stated that the toenail on the right foot may be associated with the chemotherapy.  She denies changes in her past medical history medications allergies surgeries and social history other than those mentioned.  Objective: Vital signs are stable alert oriented x 3.  Hallux nail right does demonstrate distal onycholysis and thickening of the nail with subungual debris debris and a thicker nail plate.  Sharp incurvated nail margin with erythema along the tibial border of the hallux left exquisitely tender on palpation.  There is no cellulitis drainage or odor.  She goes on to say that she which she had never had the neuroma surgery performed.  She states that her foot hurts worse now than it did before stating that it feels like there is Scotch tape on the bottom of the foot.  States that she has closet full shoes that she is unable to wear.  Assessment: Ingrown nail tibial border hallux left nail dystrophy hallux right.  Plan: Discussed etiology pathology conservative surgical therapies at this point performed a chemical matricectomy to the tibial border of the hallux left.  She tolerated this procedure well without complications.  She was given both oral and written home-going instructions for the care and soaking of the toe as well as a prescription for Cortisporin Otic to be applied twice daily after soaking.  Samples of the skin and nail were taken today to be sent for pathologic evaluation we will follow-up with her in approximately 1 month.

## 2023-01-01 ENCOUNTER — Ambulatory Visit: Payer: Medicare PPO | Admitting: Podiatry

## 2023-01-01 ENCOUNTER — Encounter: Payer: Self-pay | Admitting: Podiatry

## 2023-01-01 DIAGNOSIS — L600 Ingrowing nail: Secondary | ICD-10-CM

## 2023-01-01 DIAGNOSIS — Z9889 Other specified postprocedural states: Secondary | ICD-10-CM

## 2023-01-02 NOTE — Progress Notes (Signed)
She presents today for follow-up of her matrixectomy tibial border hallux left.  She states is doing great no problems.  Feels much better.  She has discontinued soaking.  Objective: Vital signs are stable alert oriented x 3 there is no erythema edema salines drainage or odor nail is healing very nicely.  Nail pathology demonstrates no onychomycosis only bacterial colonization.  Assessment: Bacterial colonization hallux nail plate right well-healing surgical toe left.  Plan: Recommended that she start utilizing her Cortisporin otic drops beneath the nail plate on the right foot.  Follow-up with me as needed

## 2023-01-23 ENCOUNTER — Other Ambulatory Visit: Payer: Self-pay | Admitting: Family Medicine

## 2023-01-23 NOTE — Telephone Encounter (Signed)
CPE was 04/11/22, last OV for an acute appt was on 06/10/22  Last filled on 10/29/22 #90 tabs/ 0 refills

## 2023-04-09 ENCOUNTER — Ambulatory Visit: Payer: Medicare PPO | Admitting: Surgery

## 2023-04-09 ENCOUNTER — Encounter: Payer: Self-pay | Admitting: Surgery

## 2023-04-09 VITALS — BP 170/89 | HR 73 | Temp 98.2°F | Ht 63.5 in | Wt 177.2 lb

## 2023-04-09 DIAGNOSIS — C50412 Malignant neoplasm of upper-outer quadrant of left female breast: Secondary | ICD-10-CM | POA: Diagnosis not present

## 2023-04-09 DIAGNOSIS — Z171 Estrogen receptor negative status [ER-]: Secondary | ICD-10-CM | POA: Diagnosis not present

## 2023-04-09 DIAGNOSIS — Z08 Encounter for follow-up examination after completed treatment for malignant neoplasm: Secondary | ICD-10-CM | POA: Diagnosis not present

## 2023-04-09 NOTE — Progress Notes (Signed)
04/09/2023  History of Present Illness: Megan Harrison is a 67 y.o. female s/p left breast lumpectomy and SLNBx on 11/20/21 for triple negative breast cancer.  She presents today for follow up.  She reports that she's still having intermittent pain in the left breast around the incision area.  This pain is intermittent, does not last long, and is not severe.  Denies any palpable masses or other changes.  Past Medical History: Past Medical History:  Diagnosis Date   Anemia    Cutaneous sarcoidosis    Degenerative disc disease    Diverticulosis    Dysplastic nevus 11/19/2017   L mid back 6.0 cm lat to spine   Foot pain    GERD (gastroesophageal reflux disease)    Hyperlipidemia    Malignant neoplasm of upper-outer quadrant of left breast in female, estrogen receptor negative (HCC) 11/04/2021   Migraine    Shingles    eyes     Past Surgical History: Past Surgical History:  Procedure Laterality Date   2D Echo  1999   Negative   ABDOMINAL HYSTERECTOMY  03/1998   APPENDECTOMY     Arm lesion, cutaneous sarcoid  12/2005   with normal CXR and ANA   BREAST BIOPSY Left 10/23/2021   US biopsy/ heart clip/ [facility]   BREAST LUMPECTOMY Left 2023   BREAST LUMPECTOMY WITH RADIOFREQUENCY TAG IDENTIFICATION Left 11/13/2021   lt br rf tag placement   BREAST LUMPECTOMY,RADIO FREQ LOCALIZER,AXILLARY SENTINEL LYMPH NODE BIOPSY Left 11/20/2021   Procedure: BREAST LUMPECTOMY,RADIO FREQ LOCALIZER,AXILLARY SENTINEL LYMPH NODE BIOPSY;  Surgeon: Henrene Dodge, MD;  Location: [facility] ORS;  Service: General;  Laterality: Left;   CESAREAN SECTION     x2   COLONOSCOPY  11/0/2009,08/18/13   diverticulosis and hemorrhoids   DIAGNOSTIC LAPAROSCOPY     EYE SURGERY  10/13/2018   had shingles in left eye, had to have surgery, has contact lens in now.   FOOT NEUROMA SURGERY Left    LAPAROSCOPIC APPENDECTOMY N/A 02/18/2016   Procedure: APPENDECTOMY LAPAROSCOPIC;  Surgeon: Axel Filler, MD;  Location: [facility] OR;   Service: General;  Laterality: N/A;   LUMBAR LAMINECTOMY  10/2017   PERIPHERAL VASCULAR THROMBECTOMY Right 01/16/2022   Procedure: PERIPHERAL VASCULAR THROMBECTOMY;  Surgeon: Annice Needy, MD;  Location: [facility];  Service: Cardiovascular;  Laterality: Right;   PORTA CATH REMOVAL N/A 02/28/2022   Procedure: PORTA CATH REMOVAL;  Surgeon: Annice Needy, MD;  Location: [facility];  Service: Cardiovascular;  Laterality: N/A;   PORTACATH PLACEMENT Right 12/05/2021   Procedure: INSERTION PORT-A-CATH;  Surgeon: Henrene Dodge, MD;  Location: [facility] ORS;  Service: General;  Laterality: Right;   ROTATOR CUFF REPAIR Left    TONSILLECTOMY     UPPER GASTROINTESTINAL ENDOSCOPY  11/15/2010   non-erosive gastritis, H. pylori negative    Home Medications: Prior to Admission medications   Medication Sig Start Date End Date Taking? Authorizing Provider  acetaminophen (TYLENOL) 500 MG tablet Take 2 tablets (1,000 mg total) by mouth every 6 (six) hours as needed for mild pain. 11/20/21  Yes Shaila Gilchrest, Elita Quick, MD  aspirin EC 81 MG tablet Take 81 mg by mouth daily. Swallow whole.   Yes [provider]  atorvastatin (LIPITOR) 10 MG tablet Take 1 tablet by mouth once daily 07/01/22  Yes Tower, Audrie Gallus, MD  calcium carbonate (TUMS - DOSED IN MG ELEMENTAL CALCIUM) 500 MG chewable tablet Chew 1 tablet by mouth as needed for indigestion or heartburn.  Yes [provider]  Cholecalciferol (VITAMIN D3) 50 MCG (2000 UT) TABS Take 1 tablet by mouth daily.   Yes [provider]  ferrous sulfate 325 (65 FE) MG tablet Take 325 mg by mouth daily.   Yes [provider]  fluticasone (FLONASE) 50 MCG/ACT nasal spray USE 1 SPRAY(S) IN EACH NOSTRIL ONCE DAILY AS DIRECTED 08/12/22  Yes Tower, Audrie Gallus, MD  Magnesium Hydroxide (DULCOLAX PO) Take 1 tablet by mouth 2 (two) times daily.   Yes [provider]  meloxicam (MOBIC) 15 MG tablet TAKE 1 TABLET BY MOUTH ONCE DAILY AS NEEDED  FOR PAIN 05/07/22  Yes Tower, Audrie Gallus, MD  Multiple Vitamins-Minerals (ONE-A-DAY WOMENS 50 PLUS PO) Take 1 tablet by mouth daily.   Yes [provider]  OVER THE COUNTER MEDICATION Cultrelle probiotic   Yes [provider]  prednisoLONE acetate (PRED FORTE) 1 % ophthalmic suspension Place 1 drop into the left eye every other day.   Yes [provider]  Timolol Maleate 0.5 % (DAILY) SOLN Place 1 drop into the left eye daily at 6 (six) AM. 08/16/16  Yes [provider]    Allergies: Allergies  Allergen Reactions   Codeine     REACTION: syncope   Sumatriptan     REACTION: choking sensation    Review of Systems: Review of Systems  Constitutional:  Negative for chills and fever.  Respiratory:  Negative for shortness of breath.   Cardiovascular:  Negative for chest pain.  Gastrointestinal:  Negative for abdominal pain, nausea and vomiting.  Skin:        Discomfort left breast around incision    Physical Exam BP (!) 170/89   Pulse 73   Temp 98.2 F (36.8 C) (Oral)   Ht 5' 3.5" (1.613 m)   Wt 177 lb 3.2 oz (80.4 kg)   SpO2 95%   BMI 30.90 kg/m  CONSTITUTIONAL: No acute distress, well nourished. HEENT:  Normocephalic, atraumatic, extraocular motion intact. RESPIRATORY:  Normal respiratory effort without pathologic use of accessory muscles. CARDIOVASCULAR: Regular rhythm and rate BREAST:  Left breast s/p lumpectomy with scar in upper outer quadrant well healed, with some palpable firmness consistent with scar tissue at the medial aspect.  This is where her tenderness is located.  She also has some changes of telangiectasia in the lateral aspect of the areola.  Left axilla without any lymphadenopathy, but also with some discomfort over the incision.  No masses or firmness there.  Right breast without any palpable masses, skin changes, or nipple changes.  No right axillary lymphadenopathy. NEUROLOGIC:  Motor and sensation is grossly normal.  Cranial  nerves are grossly intact. PSYCH:  Alert and oriented to person, place and time. Affect is normal.  Labs/Imaging: Mammogram 10/03/22: FINDINGS: Post operative changes are seen in the LEFTbreast. No suspicious mass, distortion, or microcalcifications are identified to suggest presence of malignancy.   IMPRESSION: No mammographic evidence for malignancy.   RECOMMENDATION: Diagnostic mammogram is suggested in 1 year. (Code:[id number]-B-01Y)   I have discussed the findings and recommendations with the patient. If applicable, a reminder letter will be sent to the patient regarding the next appointment.   BI-RADS CATEGORY  2: Benign.  Assessment and Plan: This is a 67 y.o. female s/p left breast lumpectomy and SLNBx.  --Patient is doing well now about 1.5 years from her surgery.  Discussed that the discomfort she's having in the left breast is related to scar tissue that's developed after surgery and radiation.  This may not improve significantly with time.  However, her pain is very intermittent, mild, and short duration.  Discussed with her that we should be aware if any changes with that, being more constant, more severe, or more frequent. --Follow up in 6 months with repeat mammogram.  I spent 20 minutes dedicated to the care of this patient on the date of this encounter to include pre-visit review of records, face-to-face time with the patient discussing diagnosis and management, and any post-visit coordination of care.   Howie Ill, MD [facility]

## 2023-04-09 NOTE — Patient Instructions (Addendum)
Patient has been asked to return to the office in 6 months with a bilateral diagnostic mammogram prior.   We will send you a letter about these appointments.    Continue self breast exams. Call office for any new breast issues or concerns.   Breast Self-Awareness Breast self-awareness means being familiar with how your breasts look and feel. It involves checking your breasts regularly and telling your health care provider about any changes. Practicing breast self-awareness helps to maintain breast health. Sometimes, changes are not harmful (are benign). Other times, a change in your breasts can be a sign of a serious medical problem. Being familiar with the look and feel of your breasts can help you catch a breast problem while it is still small and can be treated. You should do breast self-exams even if you have breast implants. What you need: A mirror. A well-lit room. A pillow or other soft object. How to do a breast self-exam A breast self-exam is one way to learn what is normal for your breasts and whether your breasts are changing. To do a breast self-exam: Look for changes  Remove all the clothing above your waist. Stand in front of a mirror in a room with good lighting. Put your hands down at your sides. Compare your breasts in the mirror. Look for differences between them (asymmetry), such as: Differences in shape. Differences in size. Puckers, dips, and bumps in one breast and not the other. Look at each breast for changes in the skin, such as: Redness. Scaly areas. Skin thickening. Dimpling. Open sores (ulcers). Look for changes in your nipples, such as: Discharge. Bleeding. Dimpling. Redness. A nipple that looks pushed in (retracted), or that has changed position. Feel for changes Carefully feel your breasts for lumps and changes. It is best to do this self-exam while lying down. Follow these steps to feel each breast: Place a pillow under the shoulder of one side  of your body. Place the arm of that side of your body behind your head. Feel the breast of that side of your body using the hand of the opposite arm. To do this: Start in the nipple area and use the pads of your three middle fingers to make -inch (2 cm) overlapping circles. Use light, medium, and then firm pressure as you feel your breast, gently covering the entire breast area and armpit. Continue the overlapping circles, moving downward over the breast until you feel your ribs below your breast. Then, make circles with your fingers going upward until you reach your collarbone. Next, make circles by moving outward across your breast and into your armpit area. Squeeze the nipple. Check for discharge and lumps. Repeat steps 1-7 to check your other breast. Sit or stand in the tub or shower. With soapy water on your skin, feel each breast the same way you did when you were lying down. Write down what you find Writing down what you find can help you remember what to discuss with your health care provider. Write down: What is normal for each breast. Any changes that you find in each breast. These include: The kind of changes you find. Any pain or tenderness. Size and location of any lumps. Where you are in your menstrual cycle, if you are still getting your menstrual period (menstruating). General tips If you are breastfeeding, the best time to examine your breasts is after a feeding or after using a breast pump. If you menstruate, the best time to examine your breasts is  5-7 days after your menstrual period. Breasts are generally lumpier during menstrual periods, and it may be more difficult to notice changes. With time and practice, you will become more familiar with the differences in your breasts and more comfortable with the exam. Contact a health care provider if: You see a change in the shape or size of your breasts or nipples. You see a change in the skin of your breast or nipples, such  as a reddened or scaly area. You have unusual discharge from your nipples. You find a new lump or thick area. You have breast pain. You have any concerns about your breast health. Summary Breast self-awareness includes looking for physical changes in your breasts and feeling for any changes within your breasts. Breast self-awareness should be done in front of a mirror in a well-lit room. If you menstruate, the best time to examine your breasts is 5-7 days after your menstrual period. Tell your health care provider about any changes you notice in your breasts. Changes include changes in size, changes on the skin, pain or tenderness, or unusual fluid from your nipples. This information is not intended to replace advice given to you by your health care provider. Make sure you discuss any questions you have with your health care provider. Document Revised: 09/27/2021 Document Reviewed: 02/22/2021 Elsevier Patient Education  2024 [employer].

## 2023-04-11 ENCOUNTER — Ambulatory Visit (INDEPENDENT_AMBULATORY_CARE_PROVIDER_SITE_OTHER): Payer: Medicare PPO

## 2023-04-11 VITALS — Ht 63.5 in | Wt 177.0 lb

## 2023-04-11 DIAGNOSIS — Z0000 Encounter for general adult medical examination without abnormal findings: Secondary | ICD-10-CM

## 2023-04-11 NOTE — Patient Instructions (Signed)
Megan Harrison , Thank you for taking time to come for your Medicare Wellness Visit. I appreciate your ongoing commitment to your health goals. Please review the following plan we discussed and let me know if I can assist you in the future.   Referrals/Orders/Follow-Ups/Clinician Recommendations: none  This is a list of the screening recommended for you and due dates:  Health Maintenance  Topic Date Due   Zoster (Shingles) Vaccine (2 of 2) 07/16/2019   COVID-19 Vaccine (3 - Pfizer risk series) 08/27/2019   Flu Shot  12/05/2022   Hepatitis C Screening  06/08/2023*   Colon Cancer Screening  10/21/2023   Medicare Annual Wellness Visit  04/10/2024   Mammogram  10/02/2024   DTaP/Tdap/Td vaccine (3 - Td or Tdap) 11/03/2024   Pneumonia Vaccine  Completed   DEXA scan (bone density measurement)  Completed   HPV Vaccine  Aged Out  *Topic was postponed. The date shown is not the original due date.    Advanced directives: (Copy Requested) Please bring a copy of your health care power of attorney and living will to the office to be added to your chart at your convenience.  Next Medicare Annual Wellness Visit scheduled for next year: Yes 04/13/24 @ 1pm telephone

## 2023-04-11 NOTE — Progress Notes (Signed)
Subjective:   Megan Harrison is a 67 y.o. female who presents for Medicare Annual (Subsequent) preventive examination.  Visit Complete: Virtual I connected with  [name] on 04/11/23 by a audio enabled telemedicine application and verified that I am speaking with the correct person using two identifiers.  Patient Location: Home  Provider Location: Office/Clinic  I discussed the limitations of evaluation and management by telemedicine. The patient expressed understanding and agreed to proceed.  Vital Signs: Because this visit was a virtual/telehealth visit, some criteria may be missing or patient reported. Any vitals not documented were not able to be obtained and vitals that have been documented are patient reported.  Patient Medicare AWV questionnaire was completed by the patient on 04/11/23; I have confirmed that all information answered by patient is correct and no changes since this date.      Objective:    Today's Vitals   04/11/23 1304  Weight: 177 lb (80.3 kg)  Height: 5' 3.5" (1.613 m)   Body mass index is 30.86 kg/m.     04/11/2023    3:49 PM 12/12/2022   10:08 AM 12/02/2022    9:47 AM 07/31/2022    9:54 AM 05/13/2022   11:34 AM 04/05/2022    1:43 PM 04/01/2022   10:04 AM  Advanced Directives  Does Patient Have a Medical Advance Directive? Yes Yes Yes Yes Yes Yes No  Type of Estate agent of [place];Living will Healthcare Power of [place];Living will Healthcare Power of [place];Living will Healthcare Power of [place];Living will Healthcare Power of [place];Living will Healthcare Power of [place];Living will   Does patient want to make changes to medical advance directive?  No - Patient declined   No - Patient declined No - Patient declined   Copy of Healthcare Power of Attorney in Chart? No - copy requested No - copy requested   No - copy requested No - copy requested   Would patient like information on creating a medical advance  directive?  No - Patient declined   No - Patient declined  No - Patient declined    Current Medications (verified) Outpatient Encounter Medications as of 04/11/2023  Medication Sig   acetaminophen (TYLENOL) 500 MG tablet Take 2 tablets (1,000 mg total) by mouth every 6 (six) hours as needed for mild pain.   aspirin EC 81 MG tablet Take 81 mg by mouth daily. Swallow whole.   atorvastatin (LIPITOR) 10 MG tablet Take 1 tablet by mouth once daily   calcium carbonate (TUMS - DOSED IN MG ELEMENTAL CALCIUM) 500 MG chewable tablet Chew 1 tablet by mouth as needed for indigestion or heartburn.   Cholecalciferol (VITAMIN D3) 50 MCG (2000 UT) TABS Take 1 tablet by mouth daily.   ferrous sulfate 325 (65 FE) MG tablet Take 325 mg by mouth daily.   fluticasone (FLONASE) 50 MCG/ACT nasal spray USE 1 SPRAY(S) IN EACH NOSTRIL ONCE DAILY AS DIRECTED   Magnesium Hydroxide (DULCOLAX PO) Take 1 tablet by mouth 2 (two) times daily.   meloxicam (MOBIC) 15 MG tablet Take 1 tablet (15 mg total) by mouth daily as needed for pain. With food   Multiple Vitamins-Minerals (ONE-A-DAY WOMENS 50 PLUS PO) Take 1 tablet by mouth daily.   prednisoLONE acetate (PRED FORTE) 1 % ophthalmic suspension Place 1 drop into the left eye every other day.   Timolol Maleate 0.5 % (DAILY) SOLN Place 1 drop into the left eye daily at 6 (six) AM.   Facility-Administered Encounter Medications  as of 04/11/2023  Medication   heparin lock flush 100 UNIT/ML injection   palonosetron (ALOXI) 0.25 MG/5ML injection    Allergies (verified) Codeine and Sumatriptan   History: Past Medical History:  Diagnosis Date   Anemia    Cutaneous sarcoidosis    Degenerative disc disease    Diverticulosis    Dysplastic nevus 11/19/2017   L mid back 6.0 cm lat to spine   Foot pain    GERD (gastroesophageal reflux disease)    Hyperlipidemia    Malignant neoplasm of upper-outer quadrant of left breast in female, estrogen receptor negative (HCC) 11/04/2021    Migraine    Shingles    eyes   Past Surgical History:  Procedure Laterality Date   2D Echo  1999   Negative   ABDOMINAL HYSTERECTOMY  03/1998   APPENDECTOMY     Arm lesion, cutaneous sarcoid  12/2005   with normal CXR and ANA   BREAST BIOPSY Left 10/23/2021   [facility] biopsy/ heart clip/ [facility]   BREAST LUMPECTOMY Left 2023   BREAST LUMPECTOMY WITH RADIOFREQUENCY TAG IDENTIFICATION Left 11/13/2021   lt br rf tag placement   BREAST LUMPECTOMY,RADIO FREQ LOCALIZER,AXILLARY SENTINEL LYMPH NODE BIOPSY Left 11/20/2021   Procedure: BREAST LUMPECTOMY,RADIO FREQ LOCALIZER,AXILLARY SENTINEL LYMPH NODE BIOPSY;  Surgeon: Henrene Dodge, MD;  Location: [facility] ORS;  Service: General;  Laterality: Left;   CESAREAN SECTION     x2   COLONOSCOPY  11/0/2009,08/18/13   diverticulosis and hemorrhoids   DIAGNOSTIC LAPAROSCOPY     EYE SURGERY  10/13/2018   had shingles in left eye, had to have surgery, has contact lens in now.   FOOT NEUROMA SURGERY Left    LAPAROSCOPIC APPENDECTOMY N/A 02/18/2016   Procedure: APPENDECTOMY LAPAROSCOPIC;  Surgeon: Axel Filler, MD;  Location: [facility] OR;  Service: General;  Laterality: N/A;   LUMBAR LAMINECTOMY  10/2017   PERIPHERAL VASCULAR THROMBECTOMY Right 01/16/2022   Procedure: PERIPHERAL VASCULAR THROMBECTOMY;  Surgeon: Annice Needy, MD;  Location: [facility] INVASIVE CV LAB;  Service: Cardiovascular;  Laterality: Right;   PORTA CATH REMOVAL N/A 02/28/2022   Procedure: PORTA CATH REMOVAL;  Surgeon: Annice Needy, MD;  Location: [facility] INVASIVE CV LAB;  Service: Cardiovascular;  Laterality: N/A;   PORTACATH PLACEMENT Right 12/05/2021   Procedure: INSERTION PORT-A-CATH;  Surgeon: Henrene Dodge, MD;  Location: [facility] ORS;  Service: General;  Laterality: Right;   ROTATOR CUFF REPAIR Left    TONSILLECTOMY     UPPER GASTROINTESTINAL ENDOSCOPY  11/15/2010   non-erosive gastritis, H. pylori negative   Family History  Problem Relation Age of Onset   Colon cancer Brother        early  60's   Stomach cancer Brother    Heart disease Mother        CAD   Alcohol abuse Mother    Lung cancer Father    Heart disease Brother        CAD   Throat cancer Brother    Heart disease Brother        CAD, pre diabetic, obese   Colon polyps Sister    Breast cancer Neg Hx    Rectal cancer Neg Hx    Social History   Socioeconomic History   Marital status: Married    Spouse name: Not on file   Number of children: Not on file   Years of education: Not on file   Highest education level: Not on file  Occupational History   Occupation: Works for [employer]  Employer: RETIRED  Tobacco Use   Smoking status: Never   Smokeless tobacco: Never  Vaping Use   Vaping status: Never Used  Substance and Sexual Activity   Alcohol use: No    Alcohol/week: 0.0 standard drinks of alcohol   Drug use: No   Sexual activity: Yes  Other Topics Concern   Not on file  Social History Narrative   Regular exercise:  Curves, elliptical, walking.   Social Determinants of Health   Financial Resource Strain: Low Risk  (04/10/2023)   Overall Financial Resource Strain (CARDIA)    Difficulty of Paying Living Expenses: Not hard at all  Food Insecurity: No Food Insecurity (04/10/2023)   Hunger Vital Sign    Worried About Running Out of Food in the Last Year: Never true    Ran Out of Food in the Last Year: Never true  Transportation Needs: No Transportation Needs (04/10/2023)   PRAPARE - Administrator, Civil Service (Medical): No    Lack of Transportation (Non-Medical): No  Physical Activity: Inactive (04/10/2023)   Exercise Vital Sign    Days of Exercise per Week: 0 days    Minutes of Exercise per Session: 20 min  Stress: No Stress Concern Present (04/10/2023)   [employer] of Occupational Health - Occupational Stress Questionnaire    Feeling of Stress : Not at all  Social Connections: Unknown (04/10/2023)   Social Connection and Isolation Panel [NHANES]    Frequency of  Communication with Friends and Family: Once a week    Frequency of Social Gatherings with Friends and Family: More than three times a week    Attends Religious Services: Not on Marketing executive or Organizations: Yes    Attends Banker Meetings: 1 to 4 times per year    Marital Status: Married    Tobacco Counseling Counseling given: Not Answered  Clinical Intake:  Pre-visit preparation completed: No  Pain : No/denies pain    BMI - recorded: 30.86 Nutritional Status: BMI > 30  Obese Nutritional Risks: None Diabetes: No  How often do you need to have someone help you when you read instructions, pamphlets, or other written materials from your doctor or pharmacy?: 1 - Never  Interpreter Needed?: No  Comments: lives with husband and two sons Information entered by :: B.Alyzae Hawkey,LPN   Activities of Daily Living    04/10/2023    2:37 PM  In your present state of health, do you have any difficulty performing the following activities:  Hearing? 0  Vision? 1  Difficulty concentrating or making decisions? 0  Walking or climbing stairs? 0  Dressing or bathing? 0  Doing errands, shopping? 0  Preparing Food and eating ? N  Using the Toilet? N  In the past six months, have you accidently leaked urine? N  Do you have problems with loss of bowel control? N  Managing your Medications? N  Managing your Finances? N  Housekeeping or managing your Housekeeping? N    Patient Care Team: Tower, Audrie Gallus, MD as PCP - General Hulen Luster, RN as Oncology Nurse Navigator Creig Hines, MD as Consulting Physician (Oncology) Carmina Miller, MD as Consulting Physician (Radiation Oncology) Wyn Quaker Marlow Baars, MD as Referring Physician (Vascular Surgery) Henrene Dodge, MD as Consulting Physician (General Surgery)  Indicate any recent Medical Services you may have received from other than [facility] providers in the past year (date may be approximate).     Assessment:  This is a routine wellness examination for [place].  Hearing/Vision screen Hearing Screening - Comments:: Pt says her hearing is good Vision Screening - Comments:: Pt says her vision is affected from Shingles scar tissue Dr Dellie Burns   Goals Addressed             This Visit's Progress    Maintain healthy lifestyle   On track    Stay active Healthy diet Good water intake       Depression Screen    04/11/2023    1:08 PM 06/10/2022    9:05 AM 04/05/2022    1:41 PM 02/15/2021   10:36 AM 02/01/2020    1:21 PM 01/18/2019    2:25 PM 01/12/2018    2:39 PM  PHQ 2/9 Scores  PHQ - 2 Score 0 0 0 0 0 0 0  PHQ- 9 Score  1   0      Fall Risk    04/10/2023    2:37 PM 06/10/2022    9:04 AM 04/05/2022    1:56 PM 11/07/2021    3:59 PM 02/15/2021   10:37 AM  Fall Risk   Falls in the past year? 0 0  0 0  Number falls in past yr: 0 0   0  Injury with Fall? 0 0   0  Risk for fall due to : No Fall Risks No Fall Risks Impaired mobility;Impaired balance/gait    Risk for fall due to: Comment   L foot post surgery pain/numbness    Follow up Falls prevention discussed;Education provided Falls evaluation completed Falls evaluation completed      MEDICARE RISK AT HOME: Medicare Risk at Home Any stairs in or around the home?: Yes If so, are there any without handrails?: No Home free of loose throw rugs in walkways, pet beds, electrical cords, etc?: No Adequate lighting in your home to reduce risk of falls?: Yes Life alert?: No Use of a cane, walker or w/c?: No Grab bars in the bathroom?: Yes Shower chair or bench in shower?: Yes Elevated toilet seat or a handicapped toilet?: No  TIMED UP AND GO:  Was the test performed?  No    Cognitive Function:    02/01/2020    1:23 PM  MMSE - Mini Mental State Exam  Orientation to time 5  Orientation to Place 5  Registration 3  Attention/ Calculation 5  Recall 3  Language- repeat 1        04/11/2023    1:10 PM 04/05/2022    1:46 PM  6CIT Screen   What Year? 0 points 0 points  What month? 0 points 0 points  What time? 0 points 0 points  Count back from 20 0 points 0 points  Months in reverse 0 points 0 points  Repeat phrase 0 points 0 points  Total Score 0 points 0 points    Immunizations Immunization History  Administered Date(s) Administered   Fluad Quad(high Dose 65+) 01/18/2019, 02/08/2020, 04/11/2022   Influenza Split 02/19/2011, 01/16/2021   Influenza Whole 04/09/2006, 02/13/2008, 01/19/2009, 02/02/2010   Influenza,inj,Quad PF,6+ Mos 01/02/2017, 01/04/2018   Influenza-Unspecified 01/05/2012, 01/13/2013, 02/04/2015, 01/09/2016   PFIZER(Purple Top)SARS-COV-2 Vaccination 07/09/2019, 07/30/2019   Pfizer Covid-19 Vaccine Bivalent Booster 74yrs & up 03/21/2023   Pneumococcal Conjugate-13 01/18/2019   Pneumococcal Polysaccharide-23 02/08/2020   Td 08/30/2004   Tdap 11/04/2014   Zoster Recombinant(Shingrix) 05/21/2019, 03/21/2023   Zoster, Live 11/04/2013    TDAP status: Up to date  Flu Vaccine status: Up to  date  Pneumococcal vaccine status: Up to date  Covid-19 vaccine status: Completed vaccines  Qualifies for Shingles Vaccine? Yes   Zostavax completed Yes   Shingrix Completed?: Yes  Screening Tests Health Maintenance  Topic Date Due   Hepatitis C Screening  06/08/2023 (Originally 10/17/1973)   INFLUENZA VACCINE  08/04/2023 (Originally 12/05/2022)   COVID-19 Vaccine (4 - 2023-24 season) 05/16/2023   Colonoscopy  10/21/2023   Medicare Annual Wellness (AWV)  04/10/2024   MAMMOGRAM  10/02/2024   DTaP/Tdap/Td (3 - Td or Tdap) 11/03/2024   Pneumonia Vaccine 49+ Years old  Completed   DEXA SCAN  Completed   Zoster Vaccines- Shingrix  Completed   HPV VACCINES  Aged Out    Health Maintenance  There are no preventive care reminders to display for this patient.   Colorectal cancer screening: Type of screening: Colonoscopy. Completed 10/21/2018. Repeat every 5 years  Mammogram status: Completed 10/03/22. Repeat  every year  Bone Density status: Completed 05/30/2021. Results reflect: Bone density results: NORMAL. Repeat every 5 years.  Lung Cancer Screening: (Low Dose CT Chest recommended if Age 5-80 years, 20 pack-year currently smoking OR have quit w/in 15years.) does not qualify.   Lung Cancer Screening Referral: no  Additional Screening:  Hepatitis C Screening: does not qualify; Completed 7/24/20217  Vision Screening: Recommended annual ophthalmology exams for early detection of glaucoma and other disorders of the eye. Is the patient up to date with their annual eye exam?  Yes  Who is the provider or what is the name of the office in which the patient attends annual eye exams? Dr Dellie Burns If pt is not established with a provider, would they like to be referred to a provider to establish care? No .   Dental Screening: Recommended annual dental exams for proper oral hygiene  Diabetic Foot Exam: n/a  Community Resource Referral / Chronic Care Management: CRR required this visit?  No   CCM required this visit?  No    Plan:     I have personally reviewed and noted the following in the patient's chart:   Medical and social history Use of alcohol, tobacco or illicit drugs  Current medications and supplements including opioid prescriptions. Patient is not currently taking opioid prescriptions. Functional ability and status Nutritional status Physical activity Advanced directives List of other physicians Hospitalizations, surgeries, and ER visits in previous 12 months Vitals Screenings to include cognitive, depression, and falls Referrals and appointments  In addition, I have reviewed and discussed with patient certain preventive protocols, quality metrics, and best practice recommendations. A written personalized care plan for preventive services as well as general preventive health recommendations were provided to patient.    Sue Lush, LPN   [id number]   After Visit  Summary: (MyChart) Due to this being a telephonic visit, the after visit summary with patients personalized plan was offered to patient via MyChart   Nurse Notes: The patient states she is doing well and has no concerns or questions at this time.

## 2023-04-12 ENCOUNTER — Other Ambulatory Visit: Payer: Self-pay

## 2023-04-19 ENCOUNTER — Other Ambulatory Visit: Payer: Self-pay | Admitting: Family Medicine

## 2023-04-21 NOTE — Telephone Encounter (Signed)
Last filled on 01/23/23 #90 tabs/ 0 refills   CPE scheduled 04/25/23

## 2023-04-25 ENCOUNTER — Encounter: Payer: Self-pay | Admitting: Family Medicine

## 2023-04-25 ENCOUNTER — Ambulatory Visit (INDEPENDENT_AMBULATORY_CARE_PROVIDER_SITE_OTHER): Payer: Medicare PPO | Admitting: Family Medicine

## 2023-04-25 VITALS — BP 152/90 | HR 76 | Temp 97.7°F | Ht 62.5 in | Wt 175.0 lb

## 2023-04-25 DIAGNOSIS — D3613 Benign neoplasm of peripheral nerves and autonomic nervous system of lower limb, including hip: Secondary | ICD-10-CM

## 2023-04-25 DIAGNOSIS — Z800 Family history of malignant neoplasm of digestive organs: Secondary | ICD-10-CM

## 2023-04-25 DIAGNOSIS — E7800 Pure hypercholesterolemia, unspecified: Secondary | ICD-10-CM | POA: Diagnosis not present

## 2023-04-25 DIAGNOSIS — Z0000 Encounter for general adult medical examination without abnormal findings: Secondary | ICD-10-CM | POA: Diagnosis not present

## 2023-04-25 DIAGNOSIS — M85859 Other specified disorders of bone density and structure, unspecified thigh: Secondary | ICD-10-CM | POA: Diagnosis not present

## 2023-04-25 DIAGNOSIS — Z853 Personal history of malignant neoplasm of breast: Secondary | ICD-10-CM | POA: Diagnosis not present

## 2023-04-25 DIAGNOSIS — R030 Elevated blood-pressure reading, without diagnosis of hypertension: Secondary | ICD-10-CM | POA: Diagnosis not present

## 2023-04-25 MED ORDER — ATORVASTATIN CALCIUM 10 MG PO TABS: 10.0000 mg | ORAL_TABLET | Freq: Every day | ORAL | 3 refills | Status: AC

## 2023-04-25 MED ORDER — FLUTICASONE PROPIONATE 50 MCG/ACT NA SUSP: NASAL | 3 refills | Status: AC

## 2023-04-25 NOTE — Progress Notes (Unsigned)
Subjective:    Patient ID: Megan Harrison, female    DOB: 09/02/1955, 67 y.o.   MRN: [id number]  HPI  Here for health maintenance exam and to review chronic medical problems   Wt Readings from Last 3 Encounters:  04/25/23 175 lb (79.4 kg)  04/11/23 177 lb (80.3 kg)  04/09/23 177 lb 3.2 oz (80.4 kg)   31.50 kg/m  Vitals:   04/25/23 1514 04/25/23 1544  BP: (!) 162/90 (!) 152/90  Pulse: 76   Temp: 97.7 F (36.5 C)   SpO2: 96%     Immunization History  Administered Date(s) Administered   Fluad Quad(high Dose 65+) 01/18/2019, 02/08/2020, 04/11/2022   Influenza Split 02/19/2011, 01/16/2021   Influenza Whole 04/09/2006, 02/13/2008, 01/19/2009, 02/02/2010   Influenza,inj,Quad PF,6+ Mos 01/02/2017, 01/04/2018   Influenza-Unspecified 01/05/2012, 01/13/2013, 02/04/2015, 01/09/2016, 02/24/2023   PFIZER(Purple Top)SARS-COV-2 Vaccination 07/09/2019, 07/30/2019   Pfizer Covid-19 Vaccine Bivalent Booster 78yrs & up 03/21/2023   Pneumococcal Conjugate-13 01/18/2019   Pneumococcal Polysaccharide-23 02/08/2020   Td 08/30/2004   Tdap 11/04/2014   Zoster Recombinant(Shingrix) 05/21/2019, 03/21/2023   Zoster, Live 11/04/2013    There are no preventive care reminders to display for this patient.    Mammogram 09/2022  Personal history of breast cancer  Saw surgeon for re check /doing well  Self breast exam-no lumps   Gyn health went to gyn - using estrace twice weeky for atrophy     Colon cancer screening  Colonoscopy 10/2018 with 5 y recall  Brother had colon cancer in his 41s   Bone health  Dexa  04/2022 osteopenia  Falls-none  Fractures-none  Supplements  vitamin D    Exercise  Very little  Has a painful foot / cannot find shoes or orthotics that help  Neuroma  Sees Dr Al Corpus     Mood    04/11/2023    1:08 PM 06/10/2022    9:05 AM 04/05/2022    1:41 PM 02/15/2021   10:36 AM 02/01/2020    1:21 PM  Depression screen PHQ 2/9  Decreased Interest 0 0 0 0 0  Down,  Depressed, Hopeless 0 0 0 0 0  PHQ - 2 Score 0 0 0 0 0  Altered sleeping  1   0  Tired, decreased energy  0   0  Change in appetite  0   0  Feeling bad or failure about yourself   0   0  Trouble concentrating  0   0  Moving slowly or fidgety/restless  0   0  Suicidal thoughts  0   0  PHQ-9 Score  1   0  Difficult doing work/chores  Not difficult at all   Not difficult at all   Hyperlipidemia Lab Results  Component Value Date   CHOL 176 04/25/2023   HDL 71 04/25/2023   LDLCALC 89 04/25/2023   LDLDIRECT 102.9 06/10/2011   TRIG 74 04/25/2023   CHOLHDL 2.5 04/25/2023    Atorvastatin 10 mg daily   Due for labs  Not eating very healthy right now Sense of taste is off after her chemo  Eats what she feels like   Smaller portions   Blood pressure has been trending up  BP Readings from Last 3 Encounters:  04/25/23 (!) 152/90  04/09/23 (!) 170/89  12/12/22 (!) 142/81      Patient Active Problem List   Diagnosis Date Noted   Elevated BP without diagnosis of hypertension 04/27/2023   History of breast cancer 04/25/2023  History of vaginitis 10/31/2022   Neuroma of foot 04/11/2022   History of DVT (deep vein thrombosis) 01/15/2022   Goals of care, counseling/discussion 11/30/2021   Malignant neoplasm of upper-outer quadrant of left breast in female, estrogen receptor negative (HCC) 11/04/2021   Osteopenia 01/31/2019   Estrogen deficiency 01/18/2019   Family history of colon cancer - brother in early 60's 10/21/2018   Encounter for screening mammogram for breast cancer 05/19/2017   Hyperlipidemia 12/10/2016   Encounter for routine gynecological examination 11/04/2014   Hemorrhoids, internal 04/02/2013   Routine general medical examination at a health care facility 06/09/2011   ALLERGIC RHINITIS 03/10/2009   History of shingles 08/28/2006   Past Medical History:  Diagnosis Date   Anemia    Cutaneous sarcoidosis    Degenerative disc disease    Diverticulosis     Dysplastic nevus 11/19/2017   L mid back 6.0 cm lat to spine   Foot pain    GERD (gastroesophageal reflux disease)    Hyperlipidemia    Malignant neoplasm of upper-outer quadrant of left breast in female, estrogen receptor negative (HCC) 11/04/2021   Migraine    Shingles    eyes   Past Surgical History:  Procedure Laterality Date   2D Echo  1999   Negative   ABDOMINAL HYSTERECTOMY  03/1998   APPENDECTOMY     Arm lesion, cutaneous sarcoid  12/2005   with normal CXR and ANA   BREAST BIOPSY Left 10/23/2021   US biopsy/ heart clip/ [facility]   BREAST LUMPECTOMY Left 2023   BREAST LUMPECTOMY WITH RADIOFREQUENCY TAG IDENTIFICATION Left 11/13/2021   lt br rf tag placement   BREAST LUMPECTOMY,RADIO FREQ LOCALIZER,AXILLARY SENTINEL LYMPH NODE BIOPSY Left 11/20/2021   Procedure: BREAST LUMPECTOMY,RADIO FREQ LOCALIZER,AXILLARY SENTINEL LYMPH NODE BIOPSY;  Surgeon: Henrene Dodge, MD;  Location: [facility] ORS;  Service: General;  Laterality: Left;   CESAREAN SECTION     x2   COLONOSCOPY  11/0/2009,08/18/13   diverticulosis and hemorrhoids   DIAGNOSTIC LAPAROSCOPY     EYE SURGERY  10/13/2018   had shingles in left eye, had to have surgery, has contact lens in now.   FOOT NEUROMA SURGERY Left    LAPAROSCOPIC APPENDECTOMY N/A 02/18/2016   Procedure: APPENDECTOMY LAPAROSCOPIC;  Surgeon: Axel Filler, MD;  Location: [facility] OR;  Service: General;  Laterality: N/A;   LUMBAR LAMINECTOMY  10/2017   PERIPHERAL VASCULAR THROMBECTOMY Right 01/16/2022   Procedure: PERIPHERAL VASCULAR THROMBECTOMY;  Surgeon: Annice Needy, MD;  Location: [facility];  Service: Cardiovascular;  Laterality: Right;   PORTA CATH REMOVAL N/A 02/28/2022   Procedure: PORTA CATH REMOVAL;  Surgeon: Annice Needy, MD;  Location: [facility];  Service: Cardiovascular;  Laterality: N/A;   PORTACATH PLACEMENT Right 12/05/2021   Procedure: INSERTION PORT-A-CATH;  Surgeon: Henrene Dodge, MD;  Location: [facility] OR[facility];  Service: General;   Laterality: Right;   ROTATOR CUFF REPAIR Left    TONSILLECTOMY     UPPER GASTROINTESTINAL ENDOSCOPY  11/15/2010   non-erosive gastritis, H. pylori negative   Social History   Tobacco Use   Smoking status: Never   Smokeless tobacco: Never  Vaping Use   Vaping status: Never Used  Substance Use Topics   Alcohol use: No    Alcohol/week: 0.0 standard drinks of alcohol   Drug use: No   Family History  Problem Relation Age of Onset   Colon cancer Brother        early 77's   Stomach cancer Brother  Heart disease Mother        CAD   Alcohol abuse Mother    Lung cancer Father    Heart disease Brother        CAD   Throat cancer Brother    Heart disease Brother        CAD, pre diabetic, obese   Colon polyps Sister    Breast cancer Neg Hx    Rectal cancer Neg Hx    Allergies  Allergen Reactions   Codeine     REACTION: syncope   Sumatriptan     REACTION: choking sensation   Current Outpatient Medications on File Prior to Visit  Medication Sig Dispense Refill   acetaminophen (TYLENOL) 500 MG tablet Take 2 tablets (1,000 mg total) by mouth every 6 (six) hours as needed for mild pain.     aspirin EC 81 MG tablet Take 81 mg by mouth daily. Swallow whole.     calcium carbonate (TUMS - DOSED IN MG ELEMENTAL CALCIUM) 500 MG chewable tablet Chew 1 tablet by mouth as needed for indigestion or heartburn.     Cholecalciferol (VITAMIN D3) 50 MCG (2000 UT) TABS Take 1 tablet by mouth daily.     estradiol (ESTRACE) 0.1 MG/GM vaginal cream Place 1 Applicatorful vaginally 2 (two) times a week.     ferrous sulfate 325 (65 FE) MG tablet Take 325 mg by mouth daily.     Magnesium Hydroxide (DULCOLAX PO) Take 1 tablet by mouth 2 (two) times daily.     meloxicam (MOBIC) 15 MG tablet TAKE 1 TABLET BY MOUTH ONCE DAILY AS NEEDED WITH FOOD FOR PAIN 90 tablet 1   Multiple Vitamins-Minerals (ONE-A-DAY WOMENS 50 PLUS PO) Take 1 tablet by mouth daily.     prednisoLONE acetate (PRED FORTE) 1 % ophthalmic  suspension Place 1 drop into the left eye every other day.     Timolol Maleate 0.5 % (DAILY) SOLN Place 1 drop into the left eye daily at 6 (six) AM.     Current Facility-Administered Medications on File Prior to Visit  Medication Dose Route Frequency Provider Last Rate Last Admin   heparin lock flush 100 UNIT/ML injection            palonosetron (ALOXI) 0.25 MG/5ML injection             Review of Systems  Constitutional:  Negative for activity change, appetite change, fatigue, fever and unexpected weight change.  HENT:  Negative for congestion, ear pain, rhinorrhea, sinus pressure and sore throat.   Eyes:  Negative for pain, redness and visual disturbance.  Respiratory:  Negative for cough, shortness of breath and wheezing.   Cardiovascular:  Negative for chest pain and palpitations.  Gastrointestinal:  Negative for abdominal pain, blood in stool, constipation and diarrhea.  Endocrine: Negative for polydipsia and polyuria.  Genitourinary:  Negative for dysuria, frequency and urgency.  Musculoskeletal:  Positive for arthralgias. Negative for back pain and myalgias.       Chronic foot pain from neuroma   Skin:  Negative for pallor and rash.  Allergic/Immunologic: Negative for environmental allergies.  Neurological:  Negative for dizziness, syncope and headaches.  Hematological:  Negative for adenopathy. Does not bruise/bleed easily.  Psychiatric/Behavioral:  Negative for decreased concentration and dysphoric mood. The patient is not nervous/anxious.        Objective:   Physical Exam Constitutional:      General: She is not in acute distress.    Appearance: Normal appearance. She is well-developed.  She is obese. She is not ill-appearing or diaphoretic.  HENT:     Head: Normocephalic and atraumatic.     Right Ear: Tympanic membrane, ear canal and external ear normal.     Left Ear: Tympanic membrane, ear canal and external ear normal.     Nose: Nose normal. No congestion.      Mouth/Throat:     Mouth: Mucous membranes are moist.     Pharynx: Oropharynx is clear. No posterior oropharyngeal erythema.  Eyes:     General: No scleral icterus.    Extraocular Movements: Extraocular movements intact.     Conjunctiva/sclera: Conjunctivae normal.     Pupils: Pupils are equal, round, and reactive to light.  Neck:     Thyroid: No thyromegaly.     Vascular: No carotid bruit or JVD.  Cardiovascular:     Rate and Rhythm: Normal rate and regular rhythm.     Pulses: Normal pulses.     Heart sounds: Normal heart sounds.     No gallop.  Pulmonary:     Effort: Pulmonary effort is normal. No respiratory distress.     Breath sounds: Normal breath sounds. No wheezing.     Comments: Good air exch Chest:     Chest wall: No tenderness.  Abdominal:     General: Bowel sounds are normal. There is no distension or abdominal bruit.     Palpations: Abdomen is soft. There is no mass.     Tenderness: There is no abdominal tenderness.     Hernia: No hernia is present.  Genitourinary:    Comments: Breast exam done by surgeon recently  Musculoskeletal:        General: No tenderness. Normal range of motion.     Cervical back: Normal range of motion and neck supple. No rigidity. No muscular tenderness.     Right lower leg: No edema.     Left lower leg: No edema.     Comments: No kyphosis   Lymphadenopathy:     Cervical: No cervical adenopathy.  Skin:    General: Skin is warm and dry.     Coloration: Skin is not pale.     Findings: No erythema or rash.     Comments: Solar lentigines diffusely   Neurological:     Mental Status: She is alert. Mental status is at baseline.     Cranial Nerves: No cranial nerve deficit.     Motor: No abnormal muscle tone.     Coordination: Coordination normal.     Gait: Gait normal.     Deep Tendon Reflexes: Reflexes are normal and symmetric. Reflexes normal.  Psychiatric:        Mood and Affect: Mood normal.        Cognition and Memory: Cognition  and memory normal.           Assessment & Plan:   Problem List Items Addressed This Visit       Musculoskeletal and Integument   Osteopenia   Dexa 04/2022 No falls or fracture  Discussed fall prevention, supplements and exercise for bone density          Other   Routine general medical examination at a health care facility - Primary   Reviewed health habits including diet and exercise and skin cancer prevention Reviewed appropriate screening tests for age  Also reviewed health mt list, fam hx and immunization status , as well as social and family history   See HPI Labs reviewed and ordered Mammogram  09/2022 in setting of past breast cancer  Colonoscopy due 10/2023-pt aware Discussed fall prevention, supplements and exercise for bone density  PHQ 0 Health Maintenance  Topic Date Due   Hepatitis C Screening  06/08/2023*   COVID-19 Vaccine (4 - 2024-25 season) 05/16/2023   DEXA scan (bone density measurement)  05/31/2023   Mammogram  10/03/2023   Colon Cancer Screening  10/21/2023   Medicare Annual Wellness Visit  04/24/2024   DTaP/Tdap/Td vaccine (3 - Td or Tdap) 11/03/2024   Pneumonia Vaccine  Completed   Flu Shot  Completed   Zoster (Shingles) Vaccine  Completed   HPV Vaccine  Aged Out  *Topic was postponed. The date shown is not the original due date.         Neuroma of foot   This continues to limit exercise  Discussed options for chair or water programs       Hyperlipidemia   .Disc goals for lipids and reasons to control them Rev last labs with pt Rev low sat fat diet in detail Labs ordered  Continues atorvastatin 10 mg daily       Relevant Medications   atorvastatin (LIPITOR) 10 MG tablet   Other Relevant Orders   Comprehensive metabolic panel (Completed)   Lipid Panel (Completed)   TSH (Completed)   History of breast cancer   Continue onc/surg follow up Doing well  Mammogram due 09/2023 Had surgeon exam recently      Relevant Orders    CBC with Differential/Platelet (Completed)   Comprehensive metabolic panel (Completed)   Family history of colon cancer - brother in early 71's   Pt has colonsocopy due 10/2023-[facility]      Elevated BP without diagnosis of hypertension   Based on recent readings, suspect pt has HTN  Wants to work on lifestyle change  Also takes nsaid- (meloxicam 15) and states she really needs it for quality of life  Discussed lifestyle change incl exercise / lower sodium diet/less processed foods  Given handouts re: HTN, lifestyle, and Mediterranean diet   Follow up planned  If not improved will discuss starting therapy and stopping or at least cutting dose of nsaid

## 2023-04-25 NOTE — Patient Instructions (Addendum)
Your colonoscopy will be due in June  If you don't get a reminder let us know   Stay active  Add some strength training to your routine, this is important for bone and brain health and can reduce your risk of falls and help your body use insulin properly and regulate weight  Light weights, exercise bands , and internet videos are a good way to start  Yoga (chair or regular), machines , floor exercises or a gym with machines are also good options    Try to get most of your carbohydrates from produce (with the exception of white potatoes) and whole grains Eat less bread/pasta/rice/snack foods/cereals/sweets and other items from the middle of the grocery store (processed carbs)  Reduce processed food   Follow up in 6-8 weeks for a blood pressure visit

## 2023-04-26 ENCOUNTER — Encounter: Payer: Self-pay | Admitting: Oncology

## 2023-04-26 LAB — LIPID PANEL
Cholesterol: 176 mg/dL (ref <200)
HDL: 71 mg/dL (ref >=50)
LDL Cholesterol (Calc): 89 mg/dL
Non-HDL Cholesterol (Calc): 105 mg/dL (ref <130)
Total CHOL/HDL Ratio: 2.5 (calc) (ref <5.0)
Triglycerides: 74 mg/dL (ref <150)

## 2023-04-26 LAB — COMPREHENSIVE METABOLIC PANEL
AG Ratio: 1.6 (calc) (ref 1.0–2.5)
ALT: 13 U/L (ref 6–29)
AST: 18 U/L (ref 10–35)
Albumin: 4.3 g/dL (ref 3.6–5.1)
Alkaline phosphatase (APISO): 84 U/L (ref 37–153)
BUN: 18 mg/dL (ref 7–25)
CO2: 25 mmol/L (ref 20–32)
Calcium: 9.5 mg/dL (ref 8.6–10.4)
Chloride: 106 mmol/L (ref 98–110)
Creat: 0.63 mg/dL (ref 0.50–1.05)
Globulin: 2.7 g/dL (ref 1.9–3.7)
Glucose, Bld: 92 mg/dL (ref 65–99)
Potassium: 4.4 mmol/L (ref 3.5–5.3)
Sodium: 141 mmol/L (ref 135–146)
Total Bilirubin: 0.3 mg/dL (ref 0.2–1.2)
Total Protein: 7 g/dL (ref 6.1–8.1)

## 2023-04-26 LAB — CBC WITH DIFFERENTIAL/PLATELET
Absolute Lymphocytes: 1803 {cells}/uL (ref 850–3900)
Absolute Monocytes: 831 {cells}/uL (ref 200–950)
Basophils Absolute: 28 {cells}/uL (ref 0–200)
Basophils Relative: 0.4 %
Eosinophils Absolute: 270 {cells}/uL (ref 15–500)
Eosinophils Relative: 3.8 %
HCT: 37.7 % (ref 35.0–45.0)
Hemoglobin: 12.3 g/dL (ref 11.7–15.5)
MCH: 30.1 pg (ref 27.0–33.0)
MCHC: 32.6 g/dL (ref 32.0–36.0)
MCV: 92.2 fL (ref 80.0–100.0)
MPV: 10.1 fL (ref 7.5–12.5)
Monocytes Relative: 11.7 %
Neutro Abs: 4168 {cells}/uL (ref 1500–7800)
Neutrophils Relative %: 58.7 %
Platelets: 326 10*3/uL (ref 140–400)
RBC: 4.09 10*6/uL (ref 3.80–5.10)
RDW: 12.7 % (ref 11.0–15.0)
Total Lymphocyte: 25.4 %
WBC: 7.1 10*3/uL (ref 3.8–10.8)

## 2023-04-26 LAB — TSH: TSH: 1.25 m[IU]/L (ref 0.40–4.50)

## 2023-04-27 DIAGNOSIS — R030 Elevated blood-pressure reading, without diagnosis of hypertension: Secondary | ICD-10-CM | POA: Insufficient documentation

## 2023-04-27 DIAGNOSIS — I10 Essential (primary) hypertension: Secondary | ICD-10-CM | POA: Insufficient documentation

## 2023-04-27 NOTE — Assessment & Plan Note (Addendum)
.  Disc goals for lipids and reasons to control them Rev last labs with pt Rev low sat fat diet in detail Labs ordered  Continues atorvastatin 10 mg daily

## 2023-04-27 NOTE — Assessment & Plan Note (Signed)
Dexa 04/2022 No falls or fracture  Discussed fall prevention, supplements and exercise for bone density

## 2023-04-27 NOTE — Assessment & Plan Note (Signed)
Based on recent readings, suspect pt has HTN  Wants to work on lifestyle change  Also takes nsaid- (meloxicam 15) and states she really needs it for quality of life  Discussed lifestyle change incl exercise / lower sodium diet/less processed foods  Given handouts re: HTN, lifestyle, and Mediterranean diet   Follow up planned  If not improved will discuss starting therapy and stopping or at least cutting dose of nsaid

## 2023-04-27 NOTE — Assessment & Plan Note (Signed)
Continue onc/surg follow up Doing well  Mammogram due 09/2023 Had surgeon exam recently

## 2023-04-27 NOTE — Assessment & Plan Note (Signed)
This continues to limit exercise  Discussed options for chair or water programs

## 2023-04-27 NOTE — Assessment & Plan Note (Signed)
Pt has colonsocopy due 10/2023-[facility]

## 2023-04-27 NOTE — Assessment & Plan Note (Signed)
Reviewed health habits including diet and exercise and skin cancer prevention Reviewed appropriate screening tests for age  Also reviewed health mt list, fam hx and immunization status , as well as social and family history   See HPI Labs reviewed and ordered Mammogram 09/2022 in setting of past breast cancer  Colonoscopy due 10/2023-pt aware Discussed fall prevention, supplements and exercise for bone density  PHQ 0 Health Maintenance  Topic Date Due   Hepatitis C Screening  06/08/2023*   COVID-19 Vaccine (4 - 2024-25 season) 05/16/2023   DEXA scan (bone density measurement)  05/31/2023   Mammogram  10/03/2023   Colon Cancer Screening  10/21/2023   Medicare Annual Wellness Visit  04/24/2024   DTaP/Tdap/Td vaccine (3 - Td or Tdap) 11/03/2024   Pneumonia Vaccine  Completed   Flu Shot  Completed   Zoster (Shingles) Vaccine  Completed   HPV Vaccine  Aged Out  *Topic was postponed. The date shown is not the original due date.

## 2023-05-12 DIAGNOSIS — H4062X1 Glaucoma secondary to drugs, left eye, mild stage: Secondary | ICD-10-CM | POA: Diagnosis not present

## 2023-05-12 DIAGNOSIS — H18422 Band keratopathy, left eye: Secondary | ICD-10-CM | POA: Diagnosis not present

## 2023-05-27 DIAGNOSIS — H18422 Band keratopathy, left eye: Secondary | ICD-10-CM | POA: Diagnosis not present

## 2023-06-04 ENCOUNTER — Encounter: Payer: Self-pay | Admitting: Oncology

## 2023-06-04 ENCOUNTER — Inpatient Hospital Stay: Payer: Medicare PPO | Attending: Oncology | Admitting: Oncology

## 2023-06-04 VITALS — BP 136/63 | HR 63 | Temp 96.6°F | Resp 18 | Wt 164.3 lb

## 2023-06-04 DIAGNOSIS — Z79899 Other long term (current) drug therapy: Secondary | ICD-10-CM | POA: Diagnosis not present

## 2023-06-04 DIAGNOSIS — Z08 Encounter for follow-up examination after completed treatment for malignant neoplasm: Secondary | ICD-10-CM | POA: Diagnosis not present

## 2023-06-04 DIAGNOSIS — C50912 Malignant neoplasm of unspecified site of left female breast: Secondary | ICD-10-CM | POA: Insufficient documentation

## 2023-06-04 DIAGNOSIS — Z171 Estrogen receptor negative status [ER-]: Secondary | ICD-10-CM | POA: Insufficient documentation

## 2023-06-04 DIAGNOSIS — Z923 Personal history of irradiation: Secondary | ICD-10-CM | POA: Insufficient documentation

## 2023-06-04 DIAGNOSIS — Z853 Personal history of malignant neoplasm of breast: Secondary | ICD-10-CM

## 2023-06-04 DIAGNOSIS — Z1732 Human epidermal growth factor receptor 2 negative status: Secondary | ICD-10-CM | POA: Insufficient documentation

## 2023-06-04 DIAGNOSIS — Z9221 Personal history of antineoplastic chemotherapy: Secondary | ICD-10-CM | POA: Diagnosis not present

## 2023-06-04 DIAGNOSIS — Z1722 Progesterone receptor negative status: Secondary | ICD-10-CM | POA: Diagnosis not present

## 2023-06-04 NOTE — Progress Notes (Signed)
Hematology/Oncology Consult note [facility]  [phone] [phone]  Patient Care Team: Tower, Audrie Gallus, MD as PCP - General Hulen Luster, RN as Oncology Nurse Navigator Creig Hines, MD as Consulting Physician (Oncology) Carmina Miller, MD as Consulting Physician (Radiation Oncology) Annice Needy, MD as Referring Physician (Vascular Surgery) Henrene Dodge, MD as Consulting Physician (General Surgery) Sallee Lange, MD (Ophthalmology)   Name of the patient: Megan Harrison  [id number]  [birth year]   Date of visit: 06/04/23  Diagnosis-  pathological prognostic stage Ia invasive mammary carcinoma of the left breast pT1b N0 M0 ER/PR HER2 negative   Chief complaint/ Reason for visit-routine surveillance visit for breast cancer  Heme/Onc history: Patient is a 68 year old female who underwent a screening bilateral mammogram in May 2023 which showed a possible asymmetry in the left breast.  This was followed by diagnostic mammogram and ultrasound which showed a hypoechoic mass in the left breast 3 x 4 x 6 mm.  No abnormality noted in the left axillary lymph nodes.  This was biopsied and was consistent with invasive mammary carcinoma 4 mm grade 3 ER negative less than 1%, PR negative less than 1% and HER2 negative.   Final pathology showed 8 mm grade 3 triple negative tumor with negative margins.3 sentinel lymph nodes negative for malignancy.  Plan is for adjuvant TC chemotherapy x4 cycles followed by radiation   Patient on to have extensive right upper extremity DVT related to port placement after 2 cycles of chemotherapy.  Port was taken out after completing chemotherapy.  Patient completed 3 months of Eliquis and stopped it    Patient completed 4 cycles of adjuvant TC chemotherapy on 02/13/2022.  She also completed adjuvant radiation therapy  Interval history-occasional pain in the lumpectomy site but is otherwise doing well.  She is  actively trying to lose weight.  Denies any new aches and pains anywhere  ECOG PS- 1 Pain scale- 0   Review of systems- Review of Systems  Constitutional:  Negative for chills, fever, malaise/fatigue and weight loss.  HENT:  Negative for congestion, ear discharge and nosebleeds.   Eyes:  Negative for blurred vision.  Respiratory:  Negative for cough, hemoptysis, sputum production, shortness of breath and wheezing.   Cardiovascular:  Negative for chest pain, palpitations, orthopnea and claudication.  Gastrointestinal:  Negative for abdominal pain, blood in stool, constipation, diarrhea, heartburn, melena, nausea and vomiting.  Genitourinary:  Negative for dysuria, flank pain, frequency, hematuria and urgency.  Musculoskeletal:  Negative for back pain, joint pain and myalgias.  Skin:  Negative for rash.  Neurological:  Negative for dizziness, tingling, focal weakness, seizures, weakness and headaches.  Endo/Heme/Allergies:  Does not bruise/bleed easily.  Psychiatric/Behavioral:  Negative for depression and suicidal ideas. The patient does not have insomnia.       Allergies  Allergen Reactions   Codeine     REACTION: syncope   Sumatriptan     REACTION: choking sensation     Past Medical History:  Diagnosis Date   Anemia    Cutaneous sarcoidosis    Degenerative disc disease    Diverticulosis    Dysplastic nevus 11/19/2017   L mid back 6.0 cm lat to spine   Foot pain    GERD (gastroesophageal reflux disease)    Hyperlipidemia    Malignant neoplasm of upper-outer quadrant of left breast in female, estrogen receptor negative (HCC) 11/04/2021   Migraine    Shingles    eyes  Past Surgical History:  Procedure Laterality Date   2D Echo  1999   Negative   ABDOMINAL HYSTERECTOMY  03/1998   APPENDECTOMY     Arm lesion, cutaneous sarcoid  12/2005   with normal CXR and ANA   BREAST BIOPSY Left 10/23/2021   US biopsy/ heart clip/ [facility]   BREAST LUMPECTOMY Left 2023    BREAST LUMPECTOMY WITH RADIOFREQUENCY TAG IDENTIFICATION Left 11/13/2021   lt br rf tag placement   BREAST LUMPECTOMY,RADIO FREQ LOCALIZER,AXILLARY SENTINEL LYMPH NODE BIOPSY Left 11/20/2021   Procedure: BREAST LUMPECTOMY,RADIO FREQ LOCALIZER,AXILLARY SENTINEL LYMPH NODE BIOPSY;  Surgeon: Henrene Dodge, MD;  Location: [facility] ORS;  Service: General;  Laterality: Left;   CESAREAN SECTION     x2   COLONOSCOPY  11/0/2009,08/18/13   diverticulosis and hemorrhoids   DIAGNOSTIC LAPAROSCOPY     EYE SURGERY  10/13/2018   had shingles in left eye, had to have surgery, has contact lens in now.   FOOT NEUROMA SURGERY Left    LAPAROSCOPIC APPENDECTOMY N/A 02/18/2016   Procedure: APPENDECTOMY LAPAROSCOPIC;  Surgeon: Axel Filler, MD;  Location: [facility] OR;  Service: General;  Laterality: N/A;   LUMBAR LAMINECTOMY  10/2017   PERIPHERAL VASCULAR THROMBECTOMY Right 01/16/2022   Procedure: PERIPHERAL VASCULAR THROMBECTOMY;  Surgeon: Annice Needy, MD;  Location: [facility] INVASIVE CV LAB;  Service: Cardiovascular;  Laterality: Right;   PORTA CATH REMOVAL N/A 02/28/2022   Procedure: PORTA CATH REMOVAL;  Surgeon: Annice Needy, MD;  Location: [facility] CV [facility];  Service: Cardiovascular;  Laterality: N/A;   PORTACATH PLACEMENT Right 12/05/2021   Procedure: INSERTION PORT-A-CATH;  Surgeon: Henrene Dodge, MD;  Location: [facility] ORS;  Service: General;  Laterality: Right;   ROTATOR CUFF REPAIR Left    TONSILLECTOMY     UPPER GASTROINTESTINAL ENDOSCOPY  11/15/2010   non-erosive gastritis, H. pylori negative    Social History   Socioeconomic History   Marital status: Married    Spouse name: Not on file   Number of children: Not on file   Years of education: Not on file   Highest education level: Associate degree: academic program  Occupational History   Occupation: Works for [employer]    Employer: RETIRED  Tobacco Use   Smoking status: Never   Smokeless tobacco: Never  Vaping Use   Vaping status: Never  Used  Substance and Sexual Activity   Alcohol use: No    Alcohol/week: 0.0 standard drinks of alcohol   Drug use: No   Sexual activity: Yes  Other Topics Concern   Not on file  Social History Narrative   Regular exercise:  Curves, elliptical, walking.   Social Drivers of Corporate investment banker Strain: Low Risk  (04/22/2023)   Overall Financial Resource Strain (CARDIA)    Difficulty of Paying Living Expenses: Not hard at all  Food Insecurity: No Food Insecurity (04/22/2023)   Hunger Vital Sign    Worried About Running Out of Food in the Last Year: Never true    Ran Out of Food in the Last Year: Never true  Transportation Needs: No Transportation Needs (04/22/2023)   PRAPARE - Administrator, Civil Service (Medical): No    Lack of Transportation (Non-Medical): No  Physical Activity: Insufficiently Active (04/22/2023)   Exercise Vital Sign    Days of Exercise per Week: 1 day    Minutes of Exercise per Session: 20 min  Stress: No Stress Concern Present (04/22/2023)   [employer] of Occupational Health -  Occupational Stress Questionnaire    Feeling of Stress : Only a little  Social Connections: Socially Integrated (04/22/2023)   Social Connection and Isolation Panel [NHANES]    Frequency of Communication with Friends and Family: Once a week    Frequency of Social Gatherings with Friends and Family: Three times a week    Attends Religious Services: More than 4 times per year    Active Member of Clubs or Organizations: Yes    Attends Banker Meetings: More than 4 times per year    Marital Status: Married  Catering manager Violence: Not At Risk (04/11/2023)   Humiliation, Afraid, Rape, and Kick questionnaire    Fear of Current or Ex-Partner: No    Emotionally Abused: No    Physically Abused: No    Sexually Abused: No    Family History  Problem Relation Age of Onset   Colon cancer Brother        early 59's   Stomach cancer Brother    Heart  disease Mother        CAD   Alcohol abuse Mother    Lung cancer Father    Heart disease Brother        CAD   Throat cancer Brother    Heart disease Brother        CAD, pre diabetic, obese   Colon polyps Sister    Breast cancer Neg Hx    Rectal cancer Neg Hx      Current Outpatient Medications:    acetaminophen (TYLENOL) 500 MG tablet, Take 2 tablets (1,000 mg total) by mouth every 6 (six) hours as needed for mild pain., Disp: , Rfl:    aspirin EC 81 MG tablet, Take 81 mg by mouth daily. Swallow whole., Disp: , Rfl:    atorvastatin (LIPITOR) 10 MG tablet, Take 1 tablet (10 mg total) by mouth daily., Disp: 90 tablet, Rfl: 3   calcium carbonate (TUMS - DOSED IN MG ELEMENTAL CALCIUM) 500 MG chewable tablet, Chew 1 tablet by mouth as needed for indigestion or heartburn., Disp: , Rfl:    Cholecalciferol (VITAMIN D3) 50 MCG (2000 UT) TABS, Take 1 tablet by mouth daily., Disp: , Rfl:    estradiol (ESTRACE) 0.1 MG/GM vaginal cream, Place 1 Applicatorful vaginally 2 (two) times a week., Disp: , Rfl:    ferrous sulfate 325 (65 FE) MG tablet, Take 325 mg by mouth daily., Disp: , Rfl:    fluticasone (FLONASE) 50 MCG/ACT nasal spray, USE 1 SPRAY(S) IN EACH NOSTRIL ONCE DAILY AS DIRECTED, Disp: 48 g, Rfl: 3   Magnesium Hydroxide (DULCOLAX PO), Take 1 tablet by mouth 2 (two) times daily., Disp: , Rfl:    meloxicam (MOBIC) 15 MG tablet, TAKE 1 TABLET BY MOUTH ONCE DAILY AS NEEDED WITH FOOD FOR PAIN, Disp: 90 tablet, Rfl: 1   Multiple Vitamins-Minerals (ONE-A-DAY WOMENS 50 PLUS PO), Take 1 tablet by mouth daily., Disp: , Rfl:    Timolol Maleate 0.5 % (DAILY) SOLN, Place 1 drop into the left eye daily at 6 (six) AM., Disp: , Rfl:    diazepam (VALIUM) 5 MG tablet, Take by mouth. (Patient not taking: Reported on 06/04/2023), Disp: , Rfl:    FLUZONE HIGH-DOSE 0.5 ML injection, , Disp: , Rfl:    ketorolac (ACULAR) 0.5 % ophthalmic solution, 1 drop 4 (four) times daily. (Patient not taking: Reported on  06/04/2023), Disp: , Rfl:    prednisoLONE acetate (PRED FORTE) 1 % ophthalmic suspension, Place 1 drop into the  left eye every other day. (Patient not taking: Reported on 06/04/2023), Disp: , Rfl:    SHINGRIX injection, , Disp: , Rfl:  No current facility-administered medications for this visit.  Facility-Administered Medications Ordered in Other Visits:    heparin lock flush 100 UNIT/ML injection, , , ,    palonosetron (ALOXI) 0.25 MG/5ML injection, , , ,   Physical exam:  Vitals:   06/04/23 0955  BP: 136/63  Pulse: 63  Resp: 18  Temp: (!) 96.6 F (35.9 C)  TempSrc: Tympanic  SpO2: 99%  Weight: 164 lb 4.8 oz (74.5 kg)   Physical Exam Cardiovascular:     Rate and Rhythm: Normal rate and regular rhythm.     Heart sounds: Normal heart sounds.  Pulmonary:     Effort: Pulmonary effort is normal.     Breath sounds: Normal breath sounds.  Skin:    General: Skin is warm and dry.  Neurological:     Mental Status: She is alert and oriented to person, place, and time.    Breast exam was performed in seated and lying down position. Patient is status post left lumpectomy with a well-healed surgical scar. No evidence of any palpable masses. No evidence of axillary adenopathy. No evidence of any palpable masses or lumps in the right breast. No evidence of right axillary adenopathy      Latest Ref Rng & Units 04/25/2023    4:07 PM  CMP  Glucose 65 - 99 mg/dL 92   BUN 7 - 25 mg/dL 18   Creatinine 8.41 - 1.05 mg/dL 6.60   Sodium 630 - 160 mmol/L 141   Potassium 3.5 - 5.3 mmol/L 4.4   Chloride 98 - 110 mmol/L 106   CO2 20 - 32 mmol/L 25   Calcium 8.6 - 10.4 mg/dL 9.5   Total Protein 6.1 - 8.1 g/dL 7.0   Total Bilirubin 0.2 - 1.2 mg/dL 0.3   AST 10 - 35 U/L 18   ALT 6 - 29 U/L 13       Latest Ref Rng & Units 04/25/2023    4:07 PM  CBC  WBC 3.8 - 10.8 Thousand/uL 7.1   Hemoglobin 11.7 - 15.5 g/dL 10.9   Hematocrit 32.3 - 45.0 % 37.7   Platelets 140 - 400 Thousand/uL 326       Assessment and plan- Patient is a 68 y.o. female  with stage I triple negative left breast cancer pT1b N0 M0.  She is s/p 4 cycles of adjuvant TC chemotherapy and radiation therapy.  This is a routine follow-up visit for breast cancer  Clinically patient is doing well with no concerning signs and symptoms of recurrence based on today's exam.  She completed adjuvant chemotherapy In October 2023.  She has ER/PR negative disease and therefore she is not on endocrine therapy.  Her mammogram from May 2024 was unremarkable.  I will see her back in 6 months no labs   Visit Diagnosis 1. Encounter for follow-up surveillance of breast cancer      Dr. Owens Shark, MD, MPH [facility] at [facility] [phone] 06/04/2023 12:48 PM

## 2023-06-10 ENCOUNTER — Encounter: Payer: Self-pay | Admitting: Family Medicine

## 2023-07-02 DIAGNOSIS — H18422 Band keratopathy, left eye: Secondary | ICD-10-CM | POA: Diagnosis not present

## 2023-08-13 ENCOUNTER — Other Ambulatory Visit: Payer: Self-pay

## 2023-08-13 DIAGNOSIS — C50412 Malignant neoplasm of upper-outer quadrant of left female breast: Secondary | ICD-10-CM

## 2023-08-28 ENCOUNTER — Ambulatory Visit: Payer: Medicare PPO | Admitting: Dermatology

## 2023-08-28 ENCOUNTER — Encounter: Payer: Self-pay | Admitting: Dermatology

## 2023-08-28 DIAGNOSIS — W908XXA Exposure to other nonionizing radiation, initial encounter: Secondary | ICD-10-CM | POA: Diagnosis not present

## 2023-08-28 DIAGNOSIS — Z7189 Other specified counseling: Secondary | ICD-10-CM

## 2023-08-28 DIAGNOSIS — L719 Rosacea, unspecified: Secondary | ICD-10-CM

## 2023-08-28 DIAGNOSIS — L820 Inflamed seborrheic keratosis: Secondary | ICD-10-CM | POA: Diagnosis not present

## 2023-08-28 DIAGNOSIS — I781 Nevus, non-neoplastic: Secondary | ICD-10-CM

## 2023-08-28 DIAGNOSIS — L821 Other seborrheic keratosis: Secondary | ICD-10-CM | POA: Diagnosis not present

## 2023-08-28 DIAGNOSIS — Z1283 Encounter for screening for malignant neoplasm of skin: Secondary | ICD-10-CM | POA: Diagnosis not present

## 2023-08-28 DIAGNOSIS — L814 Other melanin hyperpigmentation: Secondary | ICD-10-CM | POA: Diagnosis not present

## 2023-08-28 DIAGNOSIS — I8393 Asymptomatic varicose veins of bilateral lower extremities: Secondary | ICD-10-CM

## 2023-08-28 DIAGNOSIS — Z853 Personal history of malignant neoplasm of breast: Secondary | ICD-10-CM

## 2023-08-28 DIAGNOSIS — Z79899 Other long term (current) drug therapy: Secondary | ICD-10-CM

## 2023-08-28 DIAGNOSIS — Z86018 Personal history of other benign neoplasm: Secondary | ICD-10-CM

## 2023-08-28 DIAGNOSIS — D1801 Hemangioma of skin and subcutaneous tissue: Secondary | ICD-10-CM | POA: Diagnosis not present

## 2023-08-28 DIAGNOSIS — L718 Other rosacea: Secondary | ICD-10-CM

## 2023-08-28 DIAGNOSIS — D229 Melanocytic nevi, unspecified: Secondary | ICD-10-CM

## 2023-08-28 DIAGNOSIS — L578 Other skin changes due to chronic exposure to nonionizing radiation: Secondary | ICD-10-CM

## 2023-08-28 DIAGNOSIS — H00012 Hordeolum externum right lower eyelid: Secondary | ICD-10-CM

## 2023-08-28 MED ORDER — METRONIDAZOLE 0.75 % EX CREA
TOPICAL_CREAM | CUTANEOUS | 11 refills | Status: DC
Start: 2023-08-28 — End: 2023-10-13

## 2023-08-28 NOTE — Progress Notes (Signed)
 Follow-Up Visit   Subjective  Megan Harrison is a 68 y.o. female who presents for the following: Skin Cancer Screening and Full Body Skin Exam Reports a spot at left hand   The patient presents for Total-Body Skin Exam (TBSE) for skin cancer screening and mole check. The patient has spots, moles and lesions to be evaluated, some may be new or changing and the patient may have concern these could be cancer.  The following portions of the chart were reviewed this encounter and updated as appropriate: medications, allergies, medical history  Review of Systems:  No other skin or systemic complaints except as noted in HPI or Assessment and Plan.  Objective  Well appearing patient in no apparent distress; mood and affect are within normal limits.  A full examination was performed including scalp, head, eyes, ears, nose, lips, neck, chest, axillae, abdomen, back, buttocks, bilateral upper extremities, bilateral lower extremities, hands, feet, fingers, toes, fingernails, and toenails. All findings within normal limits unless otherwise noted below.   Relevant physical exam findings are noted in the Assessment and Plan.  Left Dorsal Hand x 1 Erythematous stuck-on, waxy papule or plaque  Assessment & Plan   SKIN CANCER SCREENING PERFORMED TODAY.  ACTINIC DAMAGE - Chronic condition, secondary to cumulative UV/sun exposure - diffuse scaly erythematous macules with underlying dyspigmentation - Recommend daily broad spectrum sunscreen SPF 30+ to sun-exposed areas, reapply every 2 hours as needed.  - Staying in the shade or wearing long sleeves, sun glasses (UVA+UVB protection) and wide brim hats (4-inch brim around the entire circumference of the hat) are also recommended for sun protection.  - Call for new or changing lesions.  LENTIGINES, SEBORRHEIC KERATOSES, HEMANGIOMAS - Benign normal skin lesions - Benign-appearing - Call for any changes  MELANOCYTIC NEVI - Tan-brown and/or  pink-flesh-colored symmetric macules and papules - Benign appearing on exam today - Observation - Call clinic for new or changing moles - Recommend daily use of broad spectrum spf 30+ sunscreen to sun-exposed areas.   Varicose Veins/Spider Veins - Dilated blue, purple or red veins at the lower extremities - Reassured - Smaller vessels can be treated by sclerotherapy (a procedure to inject a medicine into the veins to make them disappear) if desired, but the treatment is not covered by insurance. Larger vessels may be covered if symptomatic and we would refer to vascular surgeon if treatment desired.  History of breast cancer at upper outer quadrant of left breast in female estogen receptor negative. 11/04/2021 No lymphadenopathy Treatment Plan: Clear, observe    HISTORY OF DYSPLASTIC NEVUS 11/19/2017 left mid back 6 cm latera to spine No evidence of recurrence today Recommend regular full body skin exams Recommend daily broad spectrum sunscreen SPF 30+ to sun-exposed areas, reapply every 2 hours as needed.  Call if any new or changing lesions are noted between office visits   ROSACEA with Ocular Rosacea  And history of multiple styes  Exam Mid face erythema with telangiectasias +/- scattered inflammatory papules History of styes  Chronic and persistent condition with duration or expected duration over one year. Condition is bothersome/symptomatic for patient. Currently flared. Rosacea is a chronic progressive skin condition usually affecting the face of adults, causing redness and/or acne bumps. It is treatable but not curable. It sometimes affects the eyes (ocular rosacea) as well. It may respond to topical and/or systemic medication and can flare with stress, sun exposure, alcohol, exercise, topical steroids (including hydrocortisone /cortisone 10) and some foods.  Daily application of broad  spectrum spf 30+ sunscreen to face is recommended to reduce flares.  Patient denies grittiness  of the eyes  Treatment Plan Start metronidazole  0.75 cream apply twice daily  Apply to eyelids at bedtime   ROSACEA   Related Medications metroNIDAZOLE  (METROCREAM ) 0.75 % cream Apply topically to eyelids at bedtime for rosacea INFLAMED SEBORRHEIC KERATOSIS Left Dorsal Hand x 1 Symptomatic, irritating, patient would like treated.  Patient advised to contact us  if spot at left hand does not go away  Destruction of lesion - Left Dorsal Hand x 1 Complexity: simple   Destruction method: cryotherapy   Informed consent: discussed and consent obtained   Timeout:  patient name, date of birth, surgical site, and procedure verified Lesion destroyed using liquid nitrogen: Yes   Region frozen until ice ball extended beyond lesion: Yes   Outcome: patient tolerated procedure well with no complications   Post-procedure details: wound care instructions given   Return in about 1 year (around 08/27/2024) for TBSE.   Documentation: I have reviewed the above documentation for accuracy and completeness, and I agree with the above.  Celine Collard, MD

## 2023-08-28 NOTE — Patient Instructions (Addendum)
 Let us  know if spot he treated at left hand does not go away in 2 months   Cryotherapy Aftercare  Wash gently with soap and water  everyday.   Apply Vaseline and Band-Aid daily until healed.   Seborrheic Keratosis  What causes seborrheic keratoses? Seborrheic keratoses are harmless, common skin growths that first appear during adult life.  As time goes by, more growths appear.  Some people may develop a large number of them.  Seborrheic keratoses appear on both covered and uncovered body parts.  They are not caused by sunlight.  The tendency to develop seborrheic keratoses can be inherited.  They vary in color from skin-colored to gray, brown, or even black.  They can be either smooth or have a rough, warty surface.   Seborrheic keratoses are superficial and look as if they were stuck on the skin.  Under the microscope this type of keratosis looks like layers upon layers of skin.  That is why at times the top layer may seem to fall off, but the rest of the growth remains and re-grows.    Treatment Seborrheic keratoses do not need to be treated, but can easily be removed in the office.  Seborrheic keratoses often cause symptoms when they rub on clothing or jewelry.  Lesions can be in the way of shaving.  If they become inflamed, they can cause itching, soreness, or burning.  Removal of a seborrheic keratosis can be accomplished by freezing, burning, or surgery. If any spot bleeds, scabs, or grows rapidly, please return to have it checked, as these can be an indication of a skin cancer.  Melanoma ABCDEs  Melanoma is the most dangerous type of skin cancer, and is the leading cause of death from skin disease.  You are more likely to develop melanoma if you: Have light-colored skin, light-colored eyes, or red or blond hair Spend a lot of time in the sun Tan regularly, either outdoors or in a tanning bed Have had blistering sunburns, especially during childhood Have a close family member who has had  a melanoma Have atypical moles or large birthmarks  Early detection of melanoma is key since treatment is typically straightforward and cure rates are extremely high if we catch it early.   The first sign of melanoma is often a change in a mole or a new dark spot.  The ABCDE system is a way of remembering the signs of melanoma.  A for asymmetry:  The two halves do not match. B for border:  The edges of the growth are irregular. C for color:  A mixture of colors are present instead of an even brown color. D for diameter:  Melanomas are usually (but not always) greater than 6mm - the size of a pencil eraser. E for evolution:  The spot keeps changing in size, shape, and color.  Please check your skin once per month between visits. You can use a small mirror in front and a large mirror behind you to keep an eye on the back side or your body.   If you see any new or changing lesions before your next follow-up, please call to schedule a visit.  Please continue daily skin protection including broad spectrum sunscreen SPF 30+ to sun-exposed areas, reapplying every 2 hours as needed when you're outdoors.   Staying in the shade or wearing long sleeves, sun glasses (UVA+UVB protection) and wide brim hats (4-inch brim around the entire circumference of the hat) are also recommended for sun protection.  Due to recent changes in healthcare laws, you may see results of your pathology and/or laboratory studies on MyChart before the doctors have had a chance to review them. We understand that in some cases there may be results that are confusing or concerning to you. Please understand that not all results are received at the same time and often the doctors may need to interpret multiple results in order to provide you with the best plan of care or course of treatment. Therefore, we ask that you please give us  2 business days to thoroughly review all your results before contacting the office for clarification.  Should we see a critical lab result, you will be contacted sooner.   If You Need Anything After Your Visit  If you have any questions or concerns for your doctor, please call our main line at [phone] and press option 4 to reach your doctor's medical assistant. If no one answers, please leave a voicemail as directed and we will return your call as soon as possible. Messages left after 4 pm will be answered the following business day.   You may also send us  a message via MyChart. We typically respond to MyChart messages within 1-2 business days.  For prescription refills, please ask your pharmacy to contact our office. Our fax number is [phone].  If you have an urgent issue when the clinic is closed that cannot wait until the next business day, you can page your doctor at the number below.    Please note that while we do our best to be available for urgent issues outside of office hours, we are not available 24/7.   If you have an urgent issue and are unable to reach us , you may choose to seek medical care at your doctor's office, retail clinic, urgent care center, or emergency room.  If you have a medical emergency, please immediately call 911 or go to the emergency department.  Pager Numbers  - Dr. Bary Likes: [phone]  - Dr. Annette Barters: [phone]  - Dr. Felipe Horton: [phone]   In the event of inclement weather, please call our main line at [phone] for an update on the status of any delays or closures.  Dermatology Medication Tips: Please keep the boxes that topical medications come in in order to help keep track of the instructions about where and how to use these. Pharmacies typically print the medication instructions only on the boxes and not directly on the medication tubes.   If your medication is too expensive, please contact our office at [phone] option 4 or send us  a message through MyChart.   We are unable to tell what your co-pay for medications will be  in advance as this is different depending on your insurance coverage. However, we may be able to find a substitute medication at lower cost or fill out paperwork to get insurance to cover a needed medication.   If a prior authorization is required to get your medication covered by your insurance company, please allow us  1-2 business days to complete this process.  Drug prices often vary depending on where the prescription is filled and some pharmacies may offer cheaper prices.  The website www.goodrx.com contains coupons for medications through different pharmacies. The prices here do not account for what the cost may be with help from insurance (it may be cheaper with your insurance), but the website can give you the price if you did not use any insurance.  - You can print the associated coupon and take  it with your prescription to the pharmacy.  - You may also stop by our office during regular business hours and pick up a GoodRx coupon card.  - If you need your prescription sent electronically to a different pharmacy, notify our office through [facility] or by phone at [phone] option 4.     Si Usted Necesita Algo Despus de Su Visita  Tambin puede enviarnos un mensaje a travs de Clinical cytogeneticist. Por lo general respondemos a los mensajes de MyChart en el transcurso de 1 a 2 das hbiles.  Para renovar recetas, por favor pida a su farmacia que se ponga en contacto con nuestra oficina. Franz Jacks de fax es [place] [phone].  Si tiene un asunto urgente cuando la clnica est cerrada y que no puede esperar hasta el siguiente da hbil, puede llamar/localizar a su doctor(a) al nmero que aparece a continuacin.   Por favor, tenga en cuenta que aunque hacemos todo lo posible para estar disponibles para asuntos urgentes fuera del horario de [place], no estamos disponibles las 24 horas del da, los 7 [address]  [address] de la [place].   Si tiene un problema urgente y no puede comunicarse con nosotros,  puede optar por buscar atencin mdica  en el consultorio de su doctor(a), en una clnica privada, en un centro de atencin urgente o en una sala de emergencias.  Si tiene Engineer, drilling, por favor llame inmediatamente al 911 o vaya a la sala de emergencias.  Nmeros de bper  - Dr. Bary Likes: [phone]  - Dra. Annette Barters: [phone]  - Dr. Felipe Horton: [phone]   En caso de inclemencias del tiempo, por favor llame a Lajuan Pila principal al [phone] para una actualizacin sobre el [place] de cualquier retraso o cierre.  Consejos para la medicacin en dermatologa: Por favor, guarde las cajas en las que vienen los medicamentos de uso tpico para ayudarle a seguir las instrucciones sobre dnde y cmo usarlos. Las farmacias generalmente imprimen las instrucciones del medicamento slo en las cajas y no directamente en los tubos del [place].   Si su medicamento es muy caro, por favor, pngase en contacto con Bettyjane Brunet llamando al [phone] y presione la opcin 4 o envenos un mensaje a travs de Clinical cytogeneticist.   No podemos decirle cul ser su copago por los medicamentos por adelantado ya que esto es diferente dependiendo de la cobertura de su seguro. Sin embargo, es posible que podamos encontrar un medicamento sustituto a Audiological scientist un formulario para que el seguro cubra el medicamento que se considera necesario.   Si se requiere una autorizacin previa para que su compaa de seguros [place] su medicamento, por favor permtanos de 1 a 2 das hbiles para completar este proceso.  Los precios de los medicamentos varan con frecuencia dependiendo del Environmental consultant de dnde se surte la receta y alguna farmacias pueden ofrecer precios ms baratos.  El sitio web www.goodrx.com tiene cupones para medicamentos de Health and safety inspector. Los precios aqu no tienen en cuenta lo que podra costar con la ayuda del seguro (puede ser ms barato con su seguro), pero el sitio web puede darle  el precio si no utiliz Tourist information centre manager.  - Puede imprimir el cupn correspondiente y llevarlo con su receta a la farmacia.  - Tambin puede pasar por nuestra oficina durante el horario de atencin regular y Education officer, museum una tarjeta de cupones de GoodRx.  - Si necesita que su receta se enve electrnicamente a Psychiatrist, informe a nuestra oficina a travs de MyChart  de [facility] o por telfono llamando al [phone] y presione la opcin 4.

## 2023-08-29 ENCOUNTER — Other Ambulatory Visit: Payer: Self-pay

## 2023-09-02 DIAGNOSIS — H0013 Chalazion right eye, unspecified eyelid: Secondary | ICD-10-CM | POA: Diagnosis not present

## 2023-09-17 DIAGNOSIS — H0100A Unspecified blepharitis right eye, upper and lower eyelids: Secondary | ICD-10-CM | POA: Diagnosis not present

## 2023-10-09 ENCOUNTER — Ambulatory Visit
Admission: RE | Admit: 2023-10-09 | Discharge: 2023-10-09 | Disposition: A | Discharge status: Home / Self Care | Source: Ambulatory Visit | Attending: Surgery | Admitting: Surgery

## 2023-10-09 DIAGNOSIS — C50412 Malignant neoplasm of upper-outer quadrant of left female breast: Secondary | ICD-10-CM | POA: Insufficient documentation

## 2023-10-09 DIAGNOSIS — R92323 Mammographic fibroglandular density, bilateral breasts: Secondary | ICD-10-CM | POA: Diagnosis not present

## 2023-10-09 DIAGNOSIS — Z171 Estrogen receptor negative status [ER-]: Secondary | ICD-10-CM | POA: Insufficient documentation

## 2023-10-09 DIAGNOSIS — R928 Other abnormal and inconclusive findings on diagnostic imaging of breast: Secondary | ICD-10-CM | POA: Diagnosis not present

## 2023-10-09 HISTORY — DX: Personal history of irradiation: Z92.3

## 2023-10-09 HISTORY — DX: Personal history of antineoplastic chemotherapy: Z92.21

## 2023-10-13 ENCOUNTER — Ambulatory Visit: Admitting: Family Medicine

## 2023-10-13 ENCOUNTER — Encounter: Payer: Self-pay | Admitting: Family Medicine

## 2023-10-13 VITALS — BP 140/68 | HR 65 | Temp 98.4°F | Ht 62.5 in | Wt 161.2 lb

## 2023-10-13 DIAGNOSIS — H8110 Benign paroxysmal vertigo, unspecified ear: Secondary | ICD-10-CM | POA: Insufficient documentation

## 2023-10-13 DIAGNOSIS — H8111 Benign paroxysmal vertigo, right ear: Secondary | ICD-10-CM

## 2023-10-13 DIAGNOSIS — I10 Essential (primary) hypertension: Secondary | ICD-10-CM | POA: Diagnosis not present

## 2023-10-13 MED ORDER — HYDROCHLOROTHIAZIDE 12.5 MG PO CAPS: 12.5000 mg | ORAL_CAPSULE | Freq: Every day | ORAL | 1 refills | Status: DC

## 2023-10-13 NOTE — Assessment & Plan Note (Signed)
 Mild /positional  Suspect this is peripheral No other neuro symptoms  Normal exam  Declines need for meclizine  Instructed to increase her flonase  to bid Update if not starting to improve in a week or if worsening  Call back and Er precautions noted in detail today    Reviewed signs and symptoms of cva to watch for as well

## 2023-10-13 NOTE — Patient Instructions (Addendum)
 Go up on flonase  to 2 sprays in each nostril twice daily for a week  Then go down to once daily   If the dizziness does not improve in 1-2 weeks or worsens let us  know If severe or if stroke symptoms- go to the ER  Do change position slowly for now   Keep us  posted    Start hydrochlorothiazide 12.5 mg each am for blood pressure  It will make you urinate more   Keep working on healthy diet and exercise  Follow up in 1-2 weeks for visit and lab

## 2023-10-13 NOTE — Assessment & Plan Note (Signed)
 bp is still elevated  BP Readings from Last 1 Encounters:  10/13/23 (!) 140/68   Plan to try low dose hydrochlorothiazide at 12.5 mg daily -reviewed possible side effects Follow up 1-2 wk for visit and labs Instructed to call if any problems  Most recent labs reviewed  Disc lifstyle change with low sodium diet and exercise

## 2023-10-13 NOTE — Progress Notes (Signed)
 Subjective:    Patient ID: Megan Harrison, female    DOB: February 16, 1956, 68 y.o.   MRN: [id number]  HPI  Wt Readings from Last 3 Encounters:  10/13/23 161 lb 4 oz (73.1 kg)  06/04/23 164 lb 4.8 oz (74.5 kg)  04/25/23 175 lb (79.4 kg)   29.02 kg/m  Vitals:   10/13/23 0849 10/13/23 0908  BP: (!) 158/92 (!) 140/68  Pulse: 65   Temp: 98.4 F (36.9 C)   SpO2: 96%     Pt presents for c/o Dizziness  Blood pressure concerns   Dizziness If she bends over and stands up quickly - or if turns head fast, gets a spinning sensation  Lasts several minutes  No nausea / just needs to hold on to something   Not bad enough to take anything  No trouble speaking / facial droop   Has had a lot of pnd at night /mucous in throat  Clears throat a lot  Using flonase  1 spray in each nostril once daily    No heartburn   Has lost significant weight with diet /exercise  Walking is exercise of choice   Blood pressure at home-checked a few times 178/ 70? , then next day much lower    Done is EMT-checking it    Lot of episodic vertigo going around her church     Discussed meloxicam  in past and its effect on blood pressure  Stopped her tylenol  a while back - it did not help a lot    Lab Results  Component Value Date   ALT 13 04/25/2023   AST 18 04/25/2023   ALKPHOS 60 04/11/2022   BILITOT 0.3 04/25/2023     Lab Results  Component Value Date   NA 141 04/25/2023   K 4.4 04/25/2023   CO2 25 04/25/2023   GLUCOSE 92 04/25/2023   BUN 18 04/25/2023   CREATININE 0.63 04/25/2023   CALCIUM  9.5 04/25/2023   GFR 92.19 04/11/2022   GFRNONAA >60 04/01/2022     Was dx with rosacea -given topical/not using   Right eye issue      Patient Active Problem List   Diagnosis Date Noted   Benign paroxysmal positional vertigo 10/13/2023   Essential hypertension 04/27/2023   History of breast cancer 04/25/2023   History of vaginitis 10/31/2022   Neuroma of foot 04/11/2022   History  of DVT (deep vein thrombosis) 01/15/2022   Goals of care, counseling/discussion 11/30/2021   Malignant neoplasm of upper-outer quadrant of left breast in female, estrogen receptor negative (HCC) 11/04/2021   Osteopenia 01/31/2019   Estrogen deficiency 01/18/2019   Family history of colon cancer - brother in early 60's 10/21/2018   Encounter for screening mammogram for breast cancer 05/19/2017   Hyperlipidemia 12/10/2016   Encounter for routine gynecological examination 11/04/2014   Hemorrhoids, internal 04/02/2013   Routine general medical examination at a health care facility 06/09/2011   ALLERGIC RHINITIS 03/10/2009   History of shingles 08/28/2006   Past Medical History:  Diagnosis Date   Anemia    Cutaneous sarcoidosis    Degenerative disc disease    Diverticulosis    Dysplastic nevus 11/19/2017   L mid back 6.0 cm lat to spine   Foot pain    GERD (gastroesophageal reflux disease)    Hyperlipidemia    Malignant neoplasm of upper-outer quadrant of left breast in female, estrogen receptor negative (HCC) 11/04/2021   Migraine    Personal history of chemotherapy    Personal  history of radiation therapy    Shingles    eyes   Past Surgical History:  Procedure Laterality Date   2D Echo  1999   Negative   ABDOMINAL HYSTERECTOMY  03/1998   APPENDECTOMY     Arm lesion, cutaneous sarcoid  12/2005   with normal CXR and ANA   BREAST BIOPSY Left 10/23/2021   us  biopsy/ heart clip/ [facility]   BREAST LUMPECTOMY Left 2023   BREAST LUMPECTOMY WITH RADIOFREQUENCY TAG IDENTIFICATION Left 11/13/2021   lt br rf tag placement   BREAST LUMPECTOMY,RADIO FREQ LOCALIZER,AXILLARY SENTINEL LYMPH NODE BIOPSY Left 11/20/2021   Procedure: BREAST LUMPECTOMY,RADIO FREQ LOCALIZER,AXILLARY SENTINEL LYMPH NODE BIOPSY;  Surgeon: Emmalene Hare, MD;  Location: [facility] ORS;  Service: General;  Laterality: Left;   CESAREAN SECTION     x2   COLONOSCOPY  11/0/2009,08/18/13   diverticulosis and hemorrhoids    DIAGNOSTIC LAPAROSCOPY     EYE SURGERY  10/13/2018   had shingles in left eye, had to have surgery, has contact lens in now.   FOOT NEUROMA SURGERY Left    LAPAROSCOPIC APPENDECTOMY N/A 02/18/2016   Procedure: APPENDECTOMY LAPAROSCOPIC;  Surgeon: Shela Derby, MD;  Location: [facility] OR;  Service: General;  Laterality: N/A;   LUMBAR LAMINECTOMY  10/2017   PERIPHERAL VASCULAR THROMBECTOMY Right 01/16/2022   Procedure: PERIPHERAL VASCULAR THROMBECTOMY;  Surgeon: Celso College, MD;  Location: [facility] INVASIVE CV LAB;  Service: Cardiovascular;  Laterality: Right;   PORTA CATH REMOVAL N/A 02/28/2022   Procedure: PORTA CATH REMOVAL;  Surgeon: Celso College, MD;  Location: [facility] INV[facility] CV LAB;  Service: Cardiovascular;  Laterality: N/A;   PORTACATH PLACEMENT Right 12/05/2021   Procedure: INSERTION PORT-A-CATH;  Surgeon: Emmalene Hare, MD;  Location: [facility] ORS;  Service: General;  Laterality: Right;   ROTATOR CUFF REPAIR Left    TONSILLECTOMY     UPPER GASTROINTESTINAL ENDOSCOPY  11/15/2010   non-erosive gastritis, H. pylori negative   Social History   Tobacco Use   Smoking status: Never   Smokeless tobacco: Never  Vaping Use   Vaping status: Never Used  Substance Use Topics   Alcohol use: No    Alcohol/week: 0.0 standard drinks of alcohol   Drug use: No   Family History  Problem Relation Age of Onset   Colon cancer Brother        early 38's   Stomach cancer Brother    Heart disease Mother        CAD   Alcohol abuse Mother    Lung cancer Father    Heart disease Brother        CAD   Throat cancer Brother    Heart disease Brother        CAD, pre diabetic, obese   Colon polyps Sister    Breast cancer Neg Hx    Rectal cancer Neg Hx    Allergies  Allergen Reactions   Codeine     REACTION: syncope   Sumatriptan     REACTION: choking sensation   Current Outpatient Medications on File Prior to Visit  Medication Sig Dispense Refill   acetaminophen  (TYLENOL ) 500 MG tablet Take 2  tablets (1,000 mg total) by mouth every 6 (six) hours as needed for mild pain.     aspirin EC 81 MG tablet Take 81 mg by mouth daily. Swallow whole.     atorvastatin  (LIPITOR) 10 MG tablet Take 1 tablet (10 mg total) by mouth daily. 90 tablet 3   calcium  carbonate (TUMS -  DOSED IN MG ELEMENTAL CALCIUM ) 500 MG chewable tablet Chew 1 tablet by mouth as needed for indigestion or heartburn.     Cholecalciferol (VITAMIN D3) 50 MCG (2000 UT) TABS Take 1 tablet by mouth daily.     estradiol  (ESTRACE ) 0.1 MG/GM vaginal cream Place 1 Applicatorful vaginally 2 (two) times a week.     ferrous sulfate  325 (65 FE) MG tablet Take 325 mg by mouth daily.     fluticasone  (FLONASE ) 50 MCG/ACT nasal spray USE 1 SPRAY(S) IN EACH NOSTRIL ONCE DAILY AS DIRECTED 48 g 3   FLUZONE HIGH-DOSE 0.5 ML injection      Magnesium Hydroxide (DULCOLAX PO) Take 1 tablet by mouth 2 (two) times daily.     meloxicam  (MOBIC ) 15 MG tablet TAKE 1 TABLET BY MOUTH ONCE DAILY AS NEEDED WITH FOOD FOR PAIN 90 tablet 1   Multiple Vitamins-Minerals (ONE-A-DAY WOMENS 50 PLUS PO) Take 1 tablet by mouth daily.     prednisoLONE acetate (PRED FORTE) 1 % ophthalmic suspension Place 1 drop into the left eye every other day.     SHINGRIX injection      Timolol Maleate 0.5 % (DAILY) SOLN Place 1 drop into the left eye daily at 6 (six) AM.     Current Facility-Administered Medications on File Prior to Visit  Medication Dose Route Frequency Provider Last Rate Last Admin   heparin  lock flush 100 UNIT/ML injection            palonosetron  (ALOXI ) 0.25 MG/5ML injection             Review of Systems  Constitutional:  Negative for activity change, appetite change, fatigue, fever and unexpected weight change.  HENT:  Negative for congestion, ear pain, rhinorrhea, sinus pressure and sore throat.   Eyes:  Negative for pain, redness and visual disturbance.  Respiratory:  Negative for cough, shortness of breath and wheezing.   Cardiovascular:  Negative for  chest pain and palpitations.  Gastrointestinal:  Negative for abdominal pain, blood in stool, constipation and diarrhea.  Endocrine: Negative for polydipsia and polyuria.  Genitourinary:  Negative for dysuria, frequency and urgency.  Musculoskeletal:  Negative for arthralgias, back pain and myalgias.  Skin:  Negative for pallor and rash.  Allergic/Immunologic: Negative for environmental allergies.  Neurological:  Positive for dizziness. Negative for tremors, seizures, syncope, facial asymmetry, speech difficulty, weakness, light-headedness, numbness and headaches.  Hematological:  Negative for adenopathy. Does not bruise/bleed easily.  Psychiatric/Behavioral:  Negative for decreased concentration and dysphoric mood. The patient is not nervous/anxious.        Objective:   Physical Exam Constitutional:      General: She is not in acute distress.    Appearance: Normal appearance. She is well-developed. She is obese. She is not ill-appearing or diaphoretic.  HENT:     Head: Normocephalic and atraumatic.     Right Ear: External ear normal.     Left Ear: External ear normal.     Nose: Nose normal.     Comments: Boggy nares     Mouth/Throat:     Pharynx: No oropharyngeal exudate.  Eyes:     General: No scleral icterus.       Right eye: No discharge.        Left eye: No discharge.     Conjunctiva/sclera: Conjunctivae normal.     Pupils: Pupils are equal, round, and reactive to light.     Comments: 2-3 beats of right horiz nystagmus   Neck:  Thyroid : No thyromegaly.     Vascular: No carotid bruit or JVD.     Trachea: No tracheal deviation.  Cardiovascular:     Rate and Rhythm: Normal rate and regular rhythm.     Heart sounds: Normal heart sounds. No murmur heard. Pulmonary:     Effort: Pulmonary effort is normal. No respiratory distress.     Breath sounds: Normal breath sounds. No wheezing or rales.  Abdominal:     General: Bowel sounds are normal. There is no distension.      Palpations: Abdomen is soft. There is no mass.     Tenderness: There is no abdominal tenderness.  Musculoskeletal:        General: No tenderness.     Cervical back: Full passive range of motion without pain, normal range of motion and neck supple.  Lymphadenopathy:     Cervical: No cervical adenopathy.  Skin:    General: Skin is warm and dry.     Coloration: Skin is not pale.     Findings: No rash.  Neurological:     Mental Status: She is alert and oriented to person, place, and time.     Cranial Nerves: No cranial nerve deficit, dysarthria or facial asymmetry.     Sensory: Sensation is intact. No sensory deficit.     Motor: No weakness, tremor, atrophy, abnormal muscle tone, seizure activity or pronator drift.     Coordination: Romberg sign negative. Coordination normal. Finger-Nose-Finger Test normal.     Gait: Gait and tandem walk normal.     Deep Tendon Reflexes: Reflexes are normal and symmetric.     Comments: No focal cerebellar signs   Psychiatric:        Behavior: Behavior normal.        Thought Content: Thought content normal.           Assessment & Plan:   Problem List Items Addressed This Visit       Cardiovascular and Mediastinum   Essential hypertension - Primary   bp is still elevated  BP Readings from Last 1 Encounters:  10/13/23 (!) 140/68   Plan to try low dose hydrochlorothiazide at 12.5 mg daily -reviewed possible side effects Follow up 1-2 wk for visit and labs Instructed to call if any problems  Most recent labs reviewed  Disc lifstyle change with low sodium diet and exercise        Relevant Medications   hydrochlorothiazide (MICROZIDE) 12.5 MG capsule     Nervous and Auditory   Benign paroxysmal positional vertigo   Mild /positional  Suspect this is peripheral No other neuro symptoms  Normal exam  Declines need for meclizine  Instructed to increase her flonase  to bid Update if not starting to improve in a week or if worsening  Call  back and Er precautions noted in detail today    Reviewed signs and symptoms of cva to watch for as well

## 2023-10-20 ENCOUNTER — Ambulatory Visit: Admitting: Surgery

## 2023-10-20 VITALS — BP 137/82 | HR 66 | Temp 98.3°F | Ht 62.5 in | Wt 158.6 lb

## 2023-10-20 DIAGNOSIS — Z08 Encounter for follow-up examination after completed treatment for malignant neoplasm: Secondary | ICD-10-CM | POA: Diagnosis not present

## 2023-10-20 DIAGNOSIS — C50412 Malignant neoplasm of upper-outer quadrant of left female breast: Secondary | ICD-10-CM | POA: Diagnosis not present

## 2023-10-20 DIAGNOSIS — Z171 Estrogen receptor negative status [ER-]: Secondary | ICD-10-CM | POA: Diagnosis not present

## 2023-10-20 NOTE — Patient Instructions (Signed)
 Breast Self-Awareness Breast self-awareness is knowing how your breasts look and feel. You need to: Check your breasts on a regular basis. Tell your doctor about any changes. Become familiar with the look and feel of your breasts. This can help you catch a breast problem while it is still small and can be treated. You should do breast self-exams even if you have breast implants. What you need: A mirror. A well-lit room. A pillow or other soft object. How to do a breast self-exam Follow these steps to do a breast self-exam: Look for changes  Take off all the clothes above your waist. Stand in front of a mirror in a room with good lighting. Put your hands down at your sides. Compare your breasts in the mirror. Look for any difference between them, such as: A difference in shape. A difference in size. Wrinkles, dips, and bumps in one breast and not the other. Look at each breast for changes in the skin, such as: Redness. Scaly areas. Skin that has gotten thicker. Dimpling. Open sores (ulcers). Look for changes in your nipples, such as: Fluid coming out of a nipple. Fluid around a nipple. Bleeding. Dimpling. Redness. A nipple that looks pushed in (retracted), or that has changed position. Feel for changes Lie on your back. Feel each breast. To do this: Pick a breast to feel. Place a pillow under the shoulder closest to that breast. Put the arm closest to that breast behind your head. Feel the nipple area of that breast using the hand of your other arm. Feel the area with the pads of your three middle fingers by making small circles with your fingers. Use light, medium, and firm pressure. Continue the overlapping circles, moving downward over the breast. Keep making circles with your fingers. Stop when you feel your ribs. Start making circles with your fingers again, this time going upward until you reach your collarbone. Then, make circles outward across your breast and into your  armpit area. Squeeze your nipple. Check for discharge and lumps. Repeat these steps to check your other breast. Sit or stand in the tub or shower. With soapy water on your skin, feel each breast the same way you did when you were lying down. Write down what you find Writing down what you find can help you remember what to tell your doctor. Write down: What is normal for each breast. Any changes you find in each breast. These include: The kind of changes you find. A tender or painful breast. Any lump you find. Write down its size and where it is. When you last had your monthly period (menstrual cycle). General tips If you are breastfeeding, the best time to check your breasts is after you feed your baby or after you use a breast pump. If you get monthly bleeding, the best time to check your breasts is 5-7 days after your monthly cycle ends. With time, you will become comfortable with the self-exam. You will also start to know if there are changes in your breasts. Contact a doctor if: You see a change in the shape or size of your breasts or nipples. You see a change in the skin of your breast or nipples, such as red or scaly skin. You have fluid coming from your nipples that is not normal. You find a new lump or thick area. You have breast pain. You have any concerns about your breast health. Summary Breast self-awareness includes looking for changes in your breasts and feeling for changes  within your breasts. You should do breast self-awareness in front of a mirror in a well-lit room. If you get monthly periods (menstrual cycles), the best time to check your breasts is 5-7 days after your period ends. Tell your doctor about any changes you see in your breasts. Changes include changes in size, changes on the skin, painful or tender breasts, or fluid from your nipples that is not normal. This information is not intended to replace advice given to you by your health care provider. Make sure  you discuss any questions you have with your health care provider. Document Revised: 09/27/2021 Document Reviewed: 02/22/2021 Elsevier Patient Education  2024 [employer].

## 2023-10-20 NOTE — Progress Notes (Signed)
 10/20/2023  History of Present Illness: Megan Harrison is a 68 y.o. female s/p left breast tag localized lumpectomy and sentinel lymph node biopsy on 11/20/2021 for invasive breast cancer.  She was ER/PR/HER2 negative and her lymph nodes were negative.  She did require chemotherapy and had a Port-A-Cath placement on 12/05/2021.  However she had a DVT in the right upper extremity related to the port and required thrombectomy and anticoagulation.  The port was removed by Dr. Vonna Guardian after she completed her chemotherapy.  She has also completed radiation therapy.  She had her most recent mammogram on 10/09/23 which only showed expected post-operative changes in the left breast, but otherwise no suspicious findings.  She reports she is doing well and denies any new issues.  Particularly denies any new masses, skin changes.  She reports some intermittent discomfort in the left breast, but this is stable since her surgery/radiation.  Past Medical History: Past Medical History:  Diagnosis Date   Anemia    Cutaneous sarcoidosis    Degenerative disc disease    Diverticulosis    Dysplastic nevus 11/19/2017   L mid back 6.0 cm lat to spine   Foot pain    GERD (gastroesophageal reflux disease)    Hyperlipidemia    Malignant neoplasm of upper-outer quadrant of left breast in female, estrogen receptor negative (HCC) 11/04/2021   Migraine    Personal history of chemotherapy    Personal history of radiation therapy    Shingles    eyes     Past Surgical History: Past Surgical History:  Procedure Laterality Date   2D Echo  1999   Negative   ABDOMINAL HYSTERECTOMY  03/1998   APPENDECTOMY     Arm lesion, cutaneous sarcoid  12/2005   with normal CXR and ANA   BREAST BIOPSY Left 10/23/2021   us  biopsy/ [facility] clip/ [facility]   BREAST LUMPECTOMY Left 2023   BREAST LUMPECTOMY WITH RADIOFREQUENCY TAG IDENTIFICATION Left 11/13/2021   lt br rf tag placement   BREAST LUMPECTOMY,RADIO FREQ LOCALIZER,AXILLARY SENTINEL  LYMPH NODE BIOPSY Left 11/20/2021   Procedure: BREAST LUMPECTOMY,RADIO FREQ LOCALIZER,AXILLARY SENTINEL LYMPH NODE BIOPSY;  Surgeon: Emmalene Hare, MD;  Location: [facility] ORS;  Service: General;  Laterality: Left;   CESAREAN SECTION     x2   COLONOSCOPY  11/0/2009,08/18/13   diverticulosis and hemorrhoids   DIAGNOSTIC LAPAROSCOPY     EYE SURGERY  10/13/2018   had shingles in left eye, had to have surgery, has contact lens in now.   FOOT NEUROMA SURGERY Left    LAPAROSCOPIC APPENDECTOMY N/A 02/18/2016   Procedure: APPENDECTOMY LAPAROSCOPIC;  Surgeon: Shela Derby, MD;  Location: [facility];  Service: General;  Laterality: N/A;   LUMBAR LAMINECTOMY  10/2017   PERIPHERAL VASCULAR THROMBECTOMY Right 01/16/2022   Procedure: PERIPHERAL VASCULAR THROMBECTOMY;  Surgeon: Celso College, MD;  Location: [facility];  Service: Cardiovascular;  Laterality: Right;   PORTA CATH REMOVAL N/A 02/28/2022   Procedure: PORTA CATH REMOVAL;  Surgeon: Celso College, MD;  Location: [facility];  Service: Cardiovascular;  Laterality: N/A;   PORTACATH PLACEMENT Right 12/05/2021   Procedure: INSERTION PORT-A-CATH;  Surgeon: Emmalene Hare, MD;  Location: [facility] ORS;  Service: General;  Laterality: Right;   ROTATOR CUFF REPAIR Left    TONSILLECTOMY     UPPER GASTROINTESTINAL ENDOSCOPY  11/15/2010   non-erosive gastritis, H. pylori negative    Home Medications: Prior to Admission medications   Medication Sig Start Date End Date Taking?  Authorizing Provider  acetaminophen  (TYLENOL ) 500 MG tablet Take 2 tablets (1,000 mg total) by mouth every 6 (six) hours as needed for mild pain. 11/20/21  Yes Ajwa Kimberley, Volanda Gruber, MD  aspirin EC 81 MG tablet Take 81 mg by mouth daily. Swallow whole.   Yes [provider]  atorvastatin  (LIPITOR) 10 MG tablet Take 1 tablet (10 mg total) by mouth daily. 04/25/23  Yes Tower, Manley Seeds, MD  calcium  carbonate (TUMS - DOSED IN MG ELEMENTAL CALCIUM ) 500 MG chewable tablet Chew 1  tablet by mouth as needed for indigestion or heartburn.   Yes [provider]  Cholecalciferol (VITAMIN D3) 50 MCG (2000 UT) TABS Take 1 tablet by mouth daily.   Yes [provider]  estradiol  (ESTRACE ) 0.1 MG/GM vaginal cream Place 1 Applicatorful vaginally 2 (two) times a week. 01/22/23  Yes [provider]  ferrous sulfate  325 (65 FE) MG tablet Take 325 mg by mouth daily.   Yes [provider]  fluticasone  (FLONASE ) 50 MCG/ACT nasal spray USE 1 SPRAY(S) IN EACH NOSTRIL ONCE DAILY AS DIRECTED 04/25/23  Yes Tower, Manley Seeds, MD  FLUZONE HIGH-DOSE 0.5 ML injection  01/09/23  Yes [provider]  hydrochlorothiazide  (MICROZIDE ) 12.5 MG capsule Take 1 capsule (12.5 mg total) by mouth daily. 10/13/23  Yes Tower, Manley Seeds, MD  Magnesium Hydroxide (DULCOLAX PO) Take 1 tablet by mouth 2 (two) times daily.   Yes [provider]  meloxicam  (MOBIC ) 15 MG tablet TAKE 1 TABLET BY MOUTH ONCE DAILY AS NEEDED WITH FOOD FOR PAIN 04/21/23  Yes Tower, Manley Seeds, MD  Multiple Vitamins-Minerals (ONE-A-DAY WOMENS 50 PLUS PO) Take 1 tablet by mouth daily.   Yes [provider]  prednisoLONE acetate (PRED FORTE) 1 % ophthalmic suspension Place 1 drop into the left eye every other day.   Yes [provider]  Timolol Maleate 0.5 % (DAILY) SOLN Place 1 drop into the left eye daily at 6 (six) AM. 08/16/16  Yes [provider]    Allergies: Allergies  Allergen Reactions   Codeine     REACTION: syncope   Sumatriptan     REACTION: choking sensation    Review of Systems: Review of Systems  Constitutional:  Negative for chills and fever.  Respiratory:  Negative for shortness of breath.   Cardiovascular:  Negative for chest pain.  Gastrointestinal:  Negative for nausea and vomiting.  Skin:  Negative for rash.    Physical Exam BP 137/82   Pulse 66   Temp 98.3 F (36.8 C) (Oral)   Ht 5' 2.5 (1.588 m)   Wt 158 lb 9.6 oz (71.9 kg)   SpO2 98%    BMI 28.55 kg/m  CONSTITUTIONAL: No acute distress HEENT:  Normocephalic, atraumatic, extraocular motion intact. RESPIRATORY:  Lungs are clear, and breath sounds are equal bilaterally. CARDIOVASCULAR: Regular rhythm and rate BREAST: Left breast status postlumpectomy in the upper outer quadrant with incision well-healed.  There are some palpable firmness with the incision itself associated with scar tissue but otherwise no palpable masses.  The patient has an area of telangiectasia in the lateral aspect of the areola but otherwise no other skin changes or nipple changes.  No left axillary lymphadenopathy.  Right breast without any palpable masses, skin changes, or nipple changes.  No right axillary lymphadenopathy. NEUROLOGIC:  Motor and sensation is grossly normal.  Cranial nerves are grossly intact. PSYCH:  Alert and oriented to person, place and time. Affect is normal.  Labs/Imaging: Mammogram on 10/09/2023:  FINDINGS: There is density and architectural distortion within the LEFT breast, consistent with postsurgical changes. These are stable in comparison to prior. No suspicious mass, distortion, or microcalcifications are identified to suggest presence of malignancy.    IMPRESSION: No mammographic evidence of malignancy bilaterally.   RECOMMENDATION: Recommend bilateral diagnostic mammogram (with RIGHT and LEFT breast ultrasound if deemed necessary) in 1 year. This will establish over 2 years of stability status post lumpectomy.   I have discussed the findings and recommendations with the patient. If applicable, a reminder letter will be sent to the patient regarding the next appointment.   BI-RADS CATEGORY  2: Benign.  Assessment and Plan: This is a 68 y.o. female status post left breast lumpectomy and sentinel lymph node biopsy.  - Patient is almost 2 years out from her surgery and is otherwise doing well.  She denies any new or worsening pain.  Discussed with patient that the  discomfort that she is having is likely related to scar tissue as well as radiation changes.  Mammogram earlier this month did not show any suspicious findings. - Patient will follow-up with me in 1 year with repeat mammogram.  I spent 20 minutes dedicated to the care of this patient on the date of this encounter to include pre-visit review of records, face-to-face time with the patient discussing diagnosis and management, and any post-visit coordination of care.   Marene Shape, MD [facility]

## 2023-10-24 ENCOUNTER — Ambulatory Visit: Payer: Self-pay | Admitting: Family Medicine

## 2023-10-24 ENCOUNTER — Encounter: Payer: Self-pay | Admitting: Family Medicine

## 2023-10-24 ENCOUNTER — Ambulatory Visit: Admitting: Family Medicine

## 2023-10-24 VITALS — BP 118/65 | HR 60 | Temp 98.3°F | Ht 62.5 in | Wt 159.0 lb

## 2023-10-24 DIAGNOSIS — H8111 Benign paroxysmal vertigo, right ear: Secondary | ICD-10-CM

## 2023-10-24 DIAGNOSIS — Z1211 Encounter for screening for malignant neoplasm of colon: Secondary | ICD-10-CM | POA: Diagnosis not present

## 2023-10-24 DIAGNOSIS — I10 Essential (primary) hypertension: Secondary | ICD-10-CM

## 2023-10-24 LAB — BASIC METABOLIC PANEL WITH GFR
BUN: 25 mg/dL — ABNORMAL HIGH (ref 6–23)
CO2: 31 meq/L (ref 19–32)
Calcium: 9.8 mg/dL (ref 8.4–10.5)
Chloride: 101 meq/L (ref 96–112)
Creatinine, Ser: 0.62 mg/dL (ref 0.40–1.20)
GFR: 90.48 mL/min (ref >60.00)
Glucose, Bld: 89 mg/dL (ref 70–99)
Potassium: 3.5 meq/L (ref 3.5–5.1)
Sodium: 140 meq/L (ref 135–145)

## 2023-10-24 MED ORDER — HYDROCHLOROTHIAZIDE 12.5 MG PO CAPS: 12.5000 mg | ORAL_CAPSULE | Freq: Every day | ORAL | 2 refills | Status: AC

## 2023-10-24 NOTE — Patient Instructions (Addendum)
 If the positional dizziness does not improve let us  know and I can refer to ENT  If worse - call    Blood pressure is better Continue the hydrochlorothiazide    Labs today    Take care of yourself   Call to schedule your colonoscopy [facility]  [phone]

## 2023-10-24 NOTE — Assessment & Plan Note (Signed)
 Per report due for 5 y recall colonoscopy  Order done  She will call to schedule  Sees Dr Willy Harvest

## 2023-10-24 NOTE — Progress Notes (Signed)
 Subjective:    Patient ID: Megan Harrison, female    DOB: 09-07-1955, 68 y.o.   MRN: [id number]  HPI  Wt Readings from Last 3 Encounters:  10/24/23 159 lb (72.1 kg)  10/20/23 158 lb 9.6 oz (71.9 kg)  10/13/23 161 lb 4 oz (73.1 kg)   28.62 kg/m  Vitals:   10/24/23 0849 10/24/23 0903  BP: (!) 142/84 118/65  Pulse: 60   Temp: 98.3 F (36.8 C)   SpO2: 98%    Pt presents for follow up of HTN  Dizziness Colon cancer screening    HTN bp is stable today  No cp or palpitations or headaches or edema  No side effects to medicines  BP Readings from Last 3 Encounters:  10/24/23 118/65  10/20/23 137/82  10/13/23 (!) 140/68    At last visit started  Hydrochlorothiazide  12.5 mg daily    Has checked blood pressure at home a few times and it has been ok - 130s systolic    Last visit had symptoms of benign positional dizziness/vertigo  We treated with flonase  for etd   Still comes and goes About the same at it was   When she turns head quickly/changes position  No worse    Lab Results  Component Value Date   NA 141 04/25/2023   K 4.4 04/25/2023   CO2 25 04/25/2023   GLUCOSE 92 04/25/2023   BUN 18 04/25/2023   CREATININE 0.63 04/25/2023   CALCIUM  9.5 04/25/2023   GFR 92.19 04/11/2022   GFRNONAA >60 04/01/2022       Patient Active Problem List   Diagnosis Date Noted   Colon cancer screening 10/24/2023   Benign paroxysmal positional vertigo 10/13/2023   Essential hypertension 04/27/2023   History of breast cancer 04/25/2023   History of vaginitis 10/31/2022   Neuroma of foot 04/11/2022   History of DVT (deep vein thrombosis) 01/15/2022   Goals of care, counseling/discussion 11/30/2021   Malignant neoplasm of upper-outer quadrant of left breast in female, estrogen receptor negative (HCC) 11/04/2021   Osteopenia 01/31/2019   Estrogen deficiency 01/18/2019   Family history of colon cancer - brother in early 60's 10/21/2018   Encounter for screening  mammogram for breast cancer 05/19/2017   Hyperlipidemia 12/10/2016   Encounter for routine gynecological examination 11/04/2014   Hemorrhoids, internal 04/02/2013   Routine general medical examination at a health care facility 06/09/2011   ALLERGIC RHINITIS 03/10/2009   History of shingles 08/28/2006   Past Medical History:  Diagnosis Date   Allergy 1990   Anemia    Arthritis    Cutaneous sarcoidosis    Degenerative disc disease    Diverticulosis    Dysplastic nevus 11/19/2017   L mid back 6.0 cm lat to spine   Foot pain    GERD (gastroesophageal reflux disease)    Glaucoma 2005   Hyperlipidemia    Malignant neoplasm of upper-outer quadrant of left breast in female, estrogen receptor negative (HCC) 11/04/2021   Migraine    Personal history of chemotherapy    Personal history of radiation therapy    Shingles    eyes   Past Surgical History:  Procedure Laterality Date   2D Echo  1999   Negative   ABDOMINAL HYSTERECTOMY  11/99   APPENDECTOMY  02-18-16   Arm lesion, cutaneous sarcoid  12/2005   with normal CXR and ANA   BREAST BIOPSY Left 10/23/2021   us  biopsy/ heart clip/ [facility]   BREAST LUMPECTOMY Left 2023  BREAST LUMPECTOMY WITH RADIOFREQUENCY TAG IDENTIFICATION Left 11/13/2021   lt br rf tag placement   BREAST LUMPECTOMY,RADIO FREQ LOCALIZER,AXILLARY SENTINEL LYMPH NODE BIOPSY Left 11/20/2021   Procedure: BREAST LUMPECTOMY,RADIO FREQ LOCALIZER,AXILLARY SENTINEL LYMPH NODE BIOPSY;  Surgeon: Emmalene Hare, MD;  Location: [facility] ORS;  Service: General;  Laterality: Left;   BREAST SURGERY  November 18, 2021   Lumpectomy- left breast   CESAREAN SECTION     x2   COLONOSCOPY  11/0/2009,08/18/13   diverticulosis and hemorrhoids   DIAGNOSTIC LAPAROSCOPY     EYE SURGERY  10/13/2018   had shingles in left eye, had to have surgery, has contact lens in now.   FOOT NEUROMA SURGERY Left    LAPAROSCOPIC APPENDECTOMY N/A 02/18/2016   Procedure: APPENDECTOMY LAPAROSCOPIC;  Surgeon:  Shela Derby, MD;  Location: [facility] OR;  Service: General;  Laterality: N/A;   LUMBAR LAMINECTOMY  10/2017   PERIPHERAL VASCULAR THROMBECTOMY Right 01/16/2022   Procedure: PERIPHERAL VASCULAR THROMBECTOMY;  Surgeon: Celso College, MD;  Location: [facility];  Service: Cardiovascular;  Laterality: Right;   PORTA CATH REMOVAL N/A 02/28/2022   Procedure: PORTA CATH REMOVAL;  Surgeon: Celso College, MD;  Location: [facility];  Service: Cardiovascular;  Laterality: N/A;   PORTACATH PLACEMENT Right 12/05/2021   Procedure: INSERTION PORT-A-CATH;  Surgeon: Emmalene Hare, MD;  Location: [facility] ORS;  Service: General;  Laterality: Right;   ROTATOR CUFF REPAIR Left    SPINE SURGERY  10/16/2017   TONSILLECTOMY     UPPER GASTROINTESTINAL ENDOSCOPY  11/15/2010   non-erosive gastritis, H. pylori negative   Social History   Tobacco Use   Smoking status: Never   Smokeless tobacco: Never  Vaping Use   Vaping status: Never Used  Substance Use Topics   Alcohol use: No    Alcohol/week: 0.0 standard drinks of alcohol   Drug use: No   Family History  Problem Relation Age of Onset   Colon cancer Brother        early 41's   Stomach cancer Brother    Cancer Brother    Heart disease Mother        CAD   Alcohol abuse Mother    Stroke Mother    Lung cancer Father    Cancer Father    Heart disease Brother        CAD   Throat cancer Brother    Cancer Brother    Heart disease Brother        CAD, pre diabetic, obese   Cancer Brother    Colon polyps Sister    Drug abuse Brother    Breast cancer Neg Hx    Rectal cancer Neg Hx    Allergies  Allergen Reactions   Codeine     REACTION: syncope   Sumatriptan     REACTION: choking sensation   Current Outpatient Medications on File Prior to Visit  Medication Sig Dispense Refill   acetaminophen  (TYLENOL ) 500 MG tablet Take 2 tablets (1,000 mg total) by mouth every 6 (six) hours as needed for mild pain.     aspirin EC 81 MG tablet Take  81 mg by mouth daily. Swallow whole.     atorvastatin  (LIPITOR) 10 MG tablet Take 1 tablet (10 mg total) by mouth daily. 90 tablet 3   calcium  carbonate (TUMS - DOSED IN MG ELEMENTAL CALCIUM ) 500 MG chewable tablet Chew 1 tablet by mouth as needed for indigestion or heartburn.     Cholecalciferol (VITAMIN D3)  50 MCG (2000 UT) TABS Take 1 tablet by mouth daily.     estradiol  (ESTRACE ) 0.1 MG/GM vaginal cream Place 1 Applicatorful vaginally 2 (two) times a week.     ferrous sulfate  325 (65 FE) MG tablet Take 325 mg by mouth daily.     fluticasone  (FLONASE ) 50 MCG/ACT nasal spray USE 1 SPRAY(S) IN EACH NOSTRIL ONCE DAILY AS DIRECTED 48 g 3   hydrochlorothiazide  (MICROZIDE ) 12.5 MG capsule Take 1 capsule (12.5 mg total) by mouth daily. 30 capsule 1   Magnesium Hydroxide (DULCOLAX PO) Take 1 tablet by mouth 2 (two) times daily.     meloxicam  (MOBIC ) 15 MG tablet TAKE 1 TABLET BY MOUTH ONCE DAILY AS NEEDED WITH FOOD FOR PAIN 90 tablet 1   Multiple Vitamins-Minerals (ONE-A-DAY WOMENS 50 PLUS PO) Take 1 tablet by mouth daily.     prednisoLONE acetate (PRED FORTE) 1 % ophthalmic suspension Place 1 drop into the left eye every other day.     Timolol Maleate 0.5 % (DAILY) SOLN Place 1 drop into the left eye daily at 6 (six) AM.     Current Facility-Administered Medications on File Prior to Visit  Medication Dose Route Frequency Provider Last Rate Last Admin   heparin  lock flush 100 UNIT/ML injection            palonosetron  (ALOXI ) 0.25 MG/5ML injection             Review of Systems  Constitutional:  Negative for activity change, appetite change, fatigue, fever and unexpected weight change.  HENT:  Negative for congestion, rhinorrhea, sore throat and trouble swallowing.   Eyes:  Negative for pain, redness, itching and visual disturbance.  Respiratory:  Negative for cough, chest tightness, shortness of breath and wheezing.   Cardiovascular:  Negative for chest pain and palpitations.  Gastrointestinal:   Negative for abdominal pain, blood in stool, constipation, diarrhea and nausea.  Endocrine: Negative for cold intolerance, heat intolerance, polydipsia and polyuria.  Genitourinary:  Negative for difficulty urinating, dysuria, frequency and urgency.  Musculoskeletal:  Negative for arthralgias, joint swelling and myalgias.  Skin:  Negative for pallor and rash.  Neurological:  Positive for dizziness. Negative for tremors, seizures, syncope, facial asymmetry, speech difficulty, weakness, light-headedness, numbness and headaches.       Mild dizziness with quick position change   Hematological:  Negative for adenopathy. Does not bruise/bleed easily.  Psychiatric/Behavioral:  Negative for decreased concentration and dysphoric mood. The patient is not nervous/anxious.        Objective:   Physical Exam Constitutional:      General: She is not in acute distress.    Appearance: Normal appearance. She is well-developed and normal weight. She is not ill-appearing or diaphoretic.  HENT:     Head: Normocephalic and atraumatic.   Eyes:     General: No scleral icterus.       Right eye: No discharge.        Left eye: No discharge.     Extraocular Movements: Extraocular movements intact.     Conjunctiva/sclera: Conjunctivae normal.     Pupils: Pupils are equal, round, and reactive to light.     Comments: No nystagmus   Neck:     Thyroid : No thyromegaly.     Vascular: No carotid bruit or JVD.   Cardiovascular:     Rate and Rhythm: Normal rate and regular rhythm.     Heart sounds: Normal heart sounds.     No gallop.  Pulmonary:  Effort: Pulmonary effort is normal. No respiratory distress.     Breath sounds: Normal breath sounds. No wheezing or rales.  Abdominal:     General: There is no distension or abdominal bruit.     Palpations: Abdomen is soft.   Musculoskeletal:     Cervical back: Normal range of motion and neck supple.     Right lower leg: No edema.     Left lower leg: No edema.   Lymphadenopathy:     Cervical: No cervical adenopathy.   Skin:    General: Skin is warm and dry.     Coloration: Skin is not pale.     Findings: No rash.   Neurological:     Mental Status: She is alert.     Cranial Nerves: No cranial nerve deficit.     Motor: No weakness.     Coordination: Coordination normal.     Gait: Gait normal.     Deep Tendon Reflexes: Reflexes are normal and symmetric. Reflexes normal.   Psychiatric:        Mood and Affect: Mood normal.           Assessment & Plan:   Problem List Items Addressed This Visit       Cardiovascular and Mediastinum   Essential hypertension - Primary   bp in fair control at this time -improved with treatment  BP Readings from Last 1 Encounters:  10/24/23 118/65   No changes needed-will continue hydrochlorothiazide  12.5 mg daily  Tolerates well Most recent labs reviewed  Disc lifstyle change with low sodium diet and exercise   Bmet today      Relevant Orders   Basic metabolic panel with GFR     Nervous and Auditory   Benign paroxysmal positional vertigo   Pt still has some mild symptoms when turning head or changing position fast No loss of balance  No falls  Reassuring exam No nystagmus today  Offered ENT ref for eval/ she declines for now but will call if no further improvement  Encouraged to seek care if any worsening or if new neuro symptoms develop Call back and Er precautions noted in detail today          Other   Colon cancer screening   Per report due for 5 y recall colonoscopy  Order done  She will call to schedule  Sees Dr Willy Harvest       Relevant Orders   Ambulatory referral to Gastroenterology

## 2023-10-24 NOTE — Assessment & Plan Note (Signed)
 bp in fair control at this time -improved with treatment  BP Readings from Last 1 Encounters:  10/24/23 118/65   No changes needed-will continue hydrochlorothiazide  12.5 mg daily  Tolerates well Most recent labs reviewed  Disc lifstyle change with low sodium diet and exercise   Bmet today

## 2023-10-24 NOTE — Assessment & Plan Note (Signed)
 Pt still has some mild symptoms when turning head or changing position fast No loss of balance  No falls  Reassuring exam No nystagmus today  Offered ENT ref for eval/ she declines for now but will call if no further improvement  Encouraged to seek care if any worsening or if new neuro symptoms develop Call back and Er precautions noted in detail today

## 2023-10-30 DIAGNOSIS — H0012 Chalazion right lower eyelid: Secondary | ICD-10-CM | POA: Diagnosis not present

## 2023-11-14 ENCOUNTER — Other Ambulatory Visit: Payer: Self-pay | Admitting: Family Medicine

## 2023-11-14 NOTE — Telephone Encounter (Signed)
 Last filled on 04/21/23 #90 tab/ 1 refills   Last OV was on 10/24/23

## 2023-12-02 ENCOUNTER — Ambulatory Visit: Payer: Medicare PPO | Admitting: Oncology

## 2023-12-02 ENCOUNTER — Encounter: Payer: Self-pay | Admitting: Oncology

## 2023-12-09 ENCOUNTER — Encounter: Payer: Self-pay | Admitting: Nurse Practitioner

## 2023-12-09 ENCOUNTER — Inpatient Hospital Stay: Attending: Oncology | Admitting: Nurse Practitioner

## 2023-12-09 ENCOUNTER — Ambulatory Visit (AMBULATORY_SURGERY_CENTER)

## 2023-12-09 VITALS — Ht 62.5 in | Wt 155.0 lb

## 2023-12-09 VITALS — BP 134/71 | HR 64 | Temp 97.8°F | Resp 20 | Wt 159.6 lb

## 2023-12-09 DIAGNOSIS — Z9221 Personal history of antineoplastic chemotherapy: Secondary | ICD-10-CM | POA: Insufficient documentation

## 2023-12-09 DIAGNOSIS — Z800 Family history of malignant neoplasm of digestive organs: Secondary | ICD-10-CM | POA: Diagnosis not present

## 2023-12-09 DIAGNOSIS — Z809 Family history of malignant neoplasm, unspecified: Secondary | ICD-10-CM | POA: Diagnosis not present

## 2023-12-09 DIAGNOSIS — Z79899 Other long term (current) drug therapy: Secondary | ICD-10-CM | POA: Diagnosis not present

## 2023-12-09 DIAGNOSIS — Z1732 Human epidermal growth factor receptor 2 negative status: Secondary | ICD-10-CM | POA: Insufficient documentation

## 2023-12-09 DIAGNOSIS — C50912 Malignant neoplasm of unspecified site of left female breast: Secondary | ICD-10-CM | POA: Diagnosis not present

## 2023-12-09 DIAGNOSIS — Z923 Personal history of irradiation: Secondary | ICD-10-CM | POA: Insufficient documentation

## 2023-12-09 DIAGNOSIS — Z801 Family history of malignant neoplasm of trachea, bronchus and lung: Secondary | ICD-10-CM | POA: Diagnosis not present

## 2023-12-09 DIAGNOSIS — Z853 Personal history of malignant neoplasm of breast: Secondary | ICD-10-CM | POA: Diagnosis not present

## 2023-12-09 DIAGNOSIS — Z1211 Encounter for screening for malignant neoplasm of colon: Secondary | ICD-10-CM

## 2023-12-09 DIAGNOSIS — Z1722 Progesterone receptor negative status: Secondary | ICD-10-CM | POA: Diagnosis not present

## 2023-12-09 DIAGNOSIS — Z171 Estrogen receptor negative status [ER-]: Secondary | ICD-10-CM | POA: Insufficient documentation

## 2023-12-09 DIAGNOSIS — Z08 Encounter for follow-up examination after completed treatment for malignant neoplasm: Secondary | ICD-10-CM | POA: Diagnosis not present

## 2023-12-09 DIAGNOSIS — Z83719 Family history of colon polyps, unspecified: Secondary | ICD-10-CM | POA: Insufficient documentation

## 2023-12-09 MED ORDER — NA SULFATE-K SULFATE-MG SULF 17.5-3.13-1.6 GM/177ML PO SOLN: 1.0000 | Freq: Once | ORAL | 0 refills | Status: AC

## 2023-12-09 NOTE — Progress Notes (Signed)
 Hematology/Oncology Consult Note [facility]  [phone] [phone]  Patient Care Team: Tower, Laine LABOR, MD as PCP - General Georgina Shasta POUR, RN as Oncology Nurse Navigator Melanee Annah BROCKS, MD as Consulting Physician (Oncology) Lenn Aran, MD as Consulting Physician (Radiation Oncology) Marea Selinda RAMAN, MD as Referring Physician (Vascular Surgery) Desiderio Schanz, MD as Consulting Physician (General Surgery) Lenn Standing, MD (Ophthalmology)   Name of the patient: Megan Harrison  [id number]  [birth year]   Date of visit: 12/09/23  Diagnosis-  pathological prognostic stage Ia invasive mammary carcinoma of the left breast pT1b N0 M0 ER/PR HER2 negative   Chief complaint/ Reason for visit-routine surveillance visit for breast cancer  Heme/Onc history: Patient is a 68 year old female who underwent a screening bilateral mammogram in May 2023 which showed a possible asymmetry in the left breast.  This was followed by diagnostic mammogram and ultrasound which showed a hypoechoic mass in the left breast 3 x 4 x 6 mm.  No abnormality noted in the left axillary lymph nodes.  This was biopsied and was consistent with invasive mammary carcinoma 4 mm grade 3 ER negative less than 1%, PR negative less than 1% and HER2 negative.   Final pathology showed 8 mm grade 3 triple negative tumor with negative margins.3 sentinel lymph nodes negative for malignancy.  Plan is for adjuvant TC chemotherapy x4 cycles followed by radiation   Patient on to have extensive right upper extremity DVT related to port placement after 2 cycles of chemotherapy.  Port was taken out after completing chemotherapy.  Patient completed 3 months of Eliquis  and stopped it    Patient completed 4 cycles of adjuvant TC chemotherapy on 02/13/2022.  She also completed adjuvant radiation therapy  Interval history-patient is 68 year old female with above history of breast cancer who returns to  clinic for routine follow-up and continued surveillance. She feels well and denies breast changes, skin changes.  Denies unintentional weight loss or new bone pain.  ECOG PS- 1 Pain scale- 0  Review of systems- Review of Systems  Constitutional:  Negative for chills, fever, malaise/fatigue and weight loss.  HENT:  Negative for congestion, ear discharge and nosebleeds.   Eyes:  Negative for blurred vision.  Respiratory:  Negative for cough, hemoptysis, sputum production, shortness of breath and wheezing.   Cardiovascular:  Negative for chest pain, palpitations, orthopnea and claudication.  Gastrointestinal:  Negative for abdominal pain, blood in stool, constipation, diarrhea, heartburn, melena, nausea and vomiting.  Genitourinary:  Negative for dysuria, flank pain, frequency, hematuria and urgency.  Musculoskeletal:  Negative for back pain, joint pain and myalgias.  Skin:  Negative for rash.  Neurological:  Negative for dizziness, tingling, focal weakness, seizures, weakness and headaches.  Endo/Heme/Allergies:  Does not bruise/bleed easily.  Psychiatric/Behavioral:  Negative for depression and suicidal ideas. The patient does not have insomnia.     Allergies  Allergen Reactions   Sumatriptan Other (See Comments)    REACTION: choking sensation; trouble swallowing   Codeine Other (See Comments)    REACTION: syncope; Patient states that she had syncope as a child, but has had Codeine since then with no reaction   Past Medical History:  Diagnosis Date   Allergy 1990   Anemia    Arthritis    Cutaneous sarcoidosis    Degenerative disc disease    Diverticulosis    Dysplastic nevus 11/19/2017   L mid back 6.0 cm lat to spine   Foot pain    GERD (gastroesophageal  reflux disease)    Glaucoma 2005   Hyperlipidemia    Malignant neoplasm of upper-outer quadrant of left breast in female, estrogen receptor negative (HCC) 11/04/2021   Migraine    Personal history of chemotherapy     Personal history of radiation therapy    Shingles    eyes   Past Surgical History:  Procedure Laterality Date   2D Echo  1999   Negative   ABDOMINAL HYSTERECTOMY  11/99   APPENDECTOMY  02-18-16   Arm lesion, cutaneous sarcoid  12/2005   with normal CXR and ANA   BREAST BIOPSY Left 10/23/2021   us  biopsy/ heart clip/ [facility]   BREAST LUMPECTOMY Left 2023   BREAST LUMPECTOMY WITH RADIOFREQUENCY TAG IDENTIFICATION Left 11/13/2021   lt br rf tag placement   BREAST LUMPECTOMY,RADIO FREQ LOCALIZER,AXILLARY SENTINEL LYMPH NODE BIOPSY Left 11/20/2021   Procedure: BREAST LUMPECTOMY,RADIO FREQ LOCALIZER,AXILLARY SENTINEL LYMPH NODE BIOPSY;  Surgeon: Desiderio Schanz, MD;  Location: [facility] ORS;  Service: General;  Laterality: Left;   BREAST SURGERY  November 18, 2021   Lumpectomy- left breast   CESAREAN SECTION     x2   COLONOSCOPY  11/0/2009,08/18/13   diverticulosis and hemorrhoids   DIAGNOSTIC LAPAROSCOPY     EYE SURGERY  10/13/2018   had shingles in left eye, had to have surgery, has contact lens in now.   FOOT NEUROMA SURGERY Left    LAPAROSCOPIC APPENDECTOMY N/A 02/18/2016   Procedure: APPENDECTOMY LAPAROSCOPIC;  Surgeon: Lynda Leos, MD;  Location: [facility] OR;  Service: General;  Laterality: N/A;   LUMBAR LAMINECTOMY  10/2017   PERIPHERAL VASCULAR THROMBECTOMY Right 01/16/2022   Procedure: PERIPHERAL VASCULAR THROMBECTOMY;  Surgeon: Marea Selinda RAMAN, MD;  Location: [facility] INVASIVE CV LAB;  Service: Cardiovascular;  Laterality: Right;   PORTA CATH REMOVAL N/A 02/28/2022   Procedure: PORTA CATH REMOVAL;  Surgeon: Marea Selinda RAMAN, MD;  Location: [facility] INVASIVE CV LAB;  Service: Cardiovascular;  Laterality: N/A;   PORTACATH PLACEMENT Right 12/05/2021   Procedure: INSERTION PORT-A-CATH;  Surgeon: Desiderio Schanz, MD;  Location: [facility] ORS;  Service: General;  Laterality: Right;   ROTATOR CUFF REPAIR Left    SPINE SURGERY  10/16/2017   TONSILLECTOMY     UPPER GASTROINTESTINAL ENDOSCOPY  11/15/2010   non-erosive  gastritis, H. pylori negative   Social History   Socioeconomic History   Marital status: Married    Spouse name: Not on file   Number of children: Not on file   Years of education: Not on file   Highest education level: Associate degree: occupational, Scientist, product/process development, or vocational program  Occupational History   Occupation: Works for [employer]    Employer: RETIRED  Tobacco Use   Smoking status: Never   Smokeless tobacco: Never  Vaping Use   Vaping status: Never Used  Substance and Sexual Activity   Alcohol use: No    Alcohol/week: 0.0 standard drinks of alcohol   Drug use: No   Sexual activity: Yes  Other Topics Concern   Not on file  Social History Narrative   Regular exercise:  Curves, elliptical, walking.   Social Drivers of Corporate investment banker Strain: Low Risk  (10/20/2023)   Overall Financial Resource Strain (CARDIA)    Difficulty of Paying Living Expenses: Not hard at all  Food Insecurity: No Food Insecurity (10/20/2023)   Hunger Vital Sign    Worried About Running Out of Food in the Last Year: Never true    Ran Out of Food in the Last  Year: Never true  Transportation Needs: No Transportation Needs (10/20/2023)   PRAPARE - Administrator, Civil Service (Medical): No    Lack of Transportation (Non-Medical): No  Physical Activity: Sufficiently Active (10/20/2023)   Exercise Vital Sign    Days of Exercise per Week: 5 days    Minutes of Exercise per Session: 50 min  Stress: No Stress Concern Present (10/20/2023)   [employer] of Occupational Health - Occupational Stress Questionnaire    Feeling of Stress: Not at all  Social Connections: Socially Integrated (10/20/2023)   Social Connection and Isolation Panel    Frequency of Communication with Friends and Family: Twice a week    Frequency of Social Gatherings with Friends and Family: More than three times a week    Attends Religious Services: More than 4 times per year    Active Member of  [employer] or Organizations: Yes    Attends Banker Meetings: 1 to 4 times per year    Marital Status: Married  Catering manager Violence: Not At Risk (04/11/2023)   Humiliation, Afraid, Rape, and Kick questionnaire    Fear of Current or Ex-Partner: No    Emotionally Abused: No    Physically Abused: No    Sexually Abused: No   Family History  Problem Relation Age of Onset   Heart disease Mother        CAD   Alcohol abuse Mother    Stroke Mother    Lung cancer Father    Cancer Father    Colon polyps Sister    Colon cancer Brother        early 67's   Stomach cancer Brother    Cancer Brother    Esophageal cancer Brother    Heart disease Brother        CAD   Throat cancer Brother    Cancer Brother    Heart disease Brother        CAD, pre diabetic, obese   Cancer Brother    Drug abuse Brother    Breast cancer Neg Hx    Rectal cancer Neg Hx     Current Outpatient Medications:    acetaminophen  (TYLENOL ) 500 MG tablet, Take 2 tablets (1,000 mg total) by mouth every 6 (six) hours as needed for mild pain., Disp: , Rfl:    aspirin EC 81 MG tablet, Take 81 mg by mouth daily. Swallow whole., Disp: , Rfl:    atorvastatin  (LIPITOR) 10 MG tablet, Take 1 tablet (10 mg total) by mouth daily., Disp: 90 tablet, Rfl: 3   calcium  carbonate (TUMS - DOSED IN MG ELEMENTAL CALCIUM ) 500 MG chewable tablet, Chew 1 tablet by mouth as needed for indigestion or heartburn., Disp: , Rfl:    Cholecalciferol (VITAMIN D3) 50 MCG (2000 UT) TABS, Take 1 tablet by mouth daily., Disp: , Rfl:    estradiol  (ESTRACE ) 0.1 MG/GM vaginal cream, Place 1 Applicatorful vaginally 2 (two) times a week., Disp: , Rfl:    ferrous sulfate  325 (65 FE) MG tablet, Take 325 mg by mouth daily., Disp: , Rfl:    fluticasone  (FLONASE ) 50 MCG/ACT nasal spray, USE 1 SPRAY(S) IN EACH NOSTRIL ONCE DAILY AS DIRECTED, Disp: 48 g, Rfl: 3   hydrochlorothiazide  (MICROZIDE ) 12.5 MG capsule, Take 1 capsule (12.5 mg total) by mouth  daily., Disp: 90 capsule, Rfl: 2   Magnesium Hydroxide (DULCOLAX PO), Take 1 tablet by mouth 2 (two) times daily., Disp: , Rfl:    meloxicam  (MOBIC ) 15 MG  tablet, TAKE 1 TABLET BY MOUTH ONCE DAILY AS NEEDED WITH FOOD FOR PAIN, Disp: 90 tablet, Rfl: 1   Multiple Vitamins-Minerals (ONE-A-DAY WOMENS 50 PLUS PO), Take 1 tablet by mouth daily., Disp: , Rfl:    Na Sulfate-K Sulfate-Mg Sulfate concentrate (SUPREP) 17.5-3.13-1.6 GM/177ML SOLN, Take 1 kit (354 mLs total) by mouth once for 1 dose., Disp: 354 mL, Rfl: 0   prednisoLONE acetate (PRED FORTE) 1 % ophthalmic suspension, Place 1 drop into the left eye every other day., Disp: , Rfl:    Timolol Maleate 0.5 % (DAILY) SOLN, Place 1 drop into the left eye daily at 6 (six) AM., Disp: , Rfl:  No current facility-administered medications for this visit.  Facility-Administered Medications Ordered in Other Visits:    heparin  lock flush 100 UNIT/ML injection, , , ,    palonosetron  (ALOXI ) 0.25 MG/5ML injection, , , ,   Physical exam:  Vitals:   12/09/23 1458  BP: 134/71  Pulse: 64  Resp: 20  Temp: 97.8 F (36.6 C)  SpO2: 100%  Weight: 159 lb 9.6 oz (72.4 kg)   Physical Exam Cardiovascular:     Rate and Rhythm: Normal rate and regular rhythm.     Heart sounds: Normal heart sounds.  Pulmonary:     Effort: Pulmonary effort is normal.     Breath sounds: Normal breath sounds.  Skin:    General: Skin is warm and dry.  Neurological:     Mental Status: She is alert and oriented to person, place, and time.   Breast exam was performed in seated position. Patient is status post left lumpectomy with a well-healed surgical scar. No evidence of any palpable masses. No evidence of axillary adenopathy. No evidence of any palpable masses or lumps in the right breast. No evidence of right axillary adenopathy   Assessment and plan- Patient is a 68 y.o. female  who returns to clinic for follow up of:   Stage I triple negative left breast cancer - pT1b N0  M0. s/p lumpectomy with Dr. Desiderio and 4 cycles of adjuvant TC chemotherapy and radiation therapy completed 04/03/22. NED since. Most recent mammogram with Dr. Desiderio on 10/09/23 was reported as BI-RADS 2: Benign. Breast density category b: scattered. No role for breast MRI d/t dense breast tissue.  No evidence of disease today.  Clinically asymptomatic of recurrence.  She is ER/PR negative therefore there is no role for adjuvant endocrine therapy.  We discussed NCCN surveillance guidelines today including need for 57-month surveillance visits including breast exam with annual mammogram.   Disposition:  6 mo- Dr Melanee- la   Visit Diagnosis 1. Encounter for follow-up surveillance of breast cancer    Tinnie Dawn, DNP, AGNP-C, [facility] at [facility] [phone] (clinic) 12/09/2023

## 2023-12-09 NOTE — Progress Notes (Signed)
 No egg or soy allergy known to patient  No issues known to pt with past sedation with any surgeries or procedures Patient denies ever being told they had issues or difficulty with intubation  No FH of Malignant Hyperthermia Pt is not on diet pills nor GLP-1 medications Pt is not on  home 02  Pt is not on blood thinners  Pt denies issues with chronic constipation  No A fib or A flutter Have any cardiac testing pending--no Pt instructed to use [email or web address] or GoodRx for a price reduction on prep  Ambulates independently

## 2023-12-10 ENCOUNTER — Encounter: Payer: Self-pay | Admitting: Internal Medicine

## 2023-12-22 ENCOUNTER — Ambulatory Visit: Admitting: Obstetrics & Gynecology

## 2023-12-22 ENCOUNTER — Other Ambulatory Visit (HOSPITAL_COMMUNITY)
Admission: RE | Admit: 2023-12-22 | Discharge: 2023-12-22 | Disposition: A | Discharge status: Home / Self Care | Source: Ambulatory Visit | Attending: Obstetrics & Gynecology | Admitting: Obstetrics & Gynecology

## 2023-12-22 VITALS — BP 128/68 | HR 62 | Wt 157.7 lb

## 2023-12-22 DIAGNOSIS — N898 Other specified noninflammatory disorders of vagina: Secondary | ICD-10-CM | POA: Diagnosis not present

## 2023-12-22 DIAGNOSIS — N904 Leukoplakia of vulva: Secondary | ICD-10-CM

## 2023-12-22 DIAGNOSIS — Z8742 Personal history of other diseases of the female genital tract: Secondary | ICD-10-CM | POA: Insufficient documentation

## 2023-12-22 MED ORDER — CLOBETASOL PROPIONATE 0.05 % EX OINT: TOPICAL_OINTMENT | CUTANEOUS | 5 refills | Status: AC

## 2023-12-22 NOTE — Progress Notes (Signed)
    GYNECOLOGY PROGRESS NOTE  Subjective:    Patient ID: Megan Harrison, female    DOB: [birth year], 68 y.o.   MRN: [id number]  HPI  Patient is a 68 y.o. married abstinent lady here for a long h/o (years and years) vulvar itching. She had a wet prep last year that showed yeast followed by a negative test. She has used vaginal estrogen 2 nights per week which helps a little but not much. She has tried OTC vagisil and monistat. She wears panty liners daily for the worry about urine leaking but rarely actually has leaking. She has been abstinent for years. She had a hysterectomy and removal of ovaries in the distant past.  The following portions of the patient's history were reviewed and updated as appropriate: allergies, current medications, past family history, past medical history, past social history, past surgical history, and problem list.  Review of Systems Pertinent items are noted in HPI.   Objective:   Blood pressure 128/68, pulse 62, weight 157 lb 11.2 oz (71.5 kg). Body mass index is 28.38 kg/m. Well nourished, well hydrated White female, no apparent distress She is ambulating and conversing normally. EG- paucity of pubic hair She has white areas above the clitoris and and the vaginal introitus. These are c/w lichen sclerosis. Speculum exam reveals a normal vagina with healthy mucosa.  Assessment:   1. History of vaginitis   2. Vagina itching   3. Lichen sclerosus et atrophicus of the vulva      Plan:   1. History of vaginitis (Primary)  - Cervicovaginal ancillary only  2. Vagina itching  - Cervicovaginal ancillary only  3. Lichen sclerosus et atrophicus of the vulva - Rec SVE monthly due to increased risk of vuvlar cancer - rec temovate  every other night for basically her lifetime

## 2023-12-23 DIAGNOSIS — H01003 Unspecified blepharitis right eye, unspecified eyelid: Secondary | ICD-10-CM | POA: Diagnosis not present

## 2023-12-23 LAB — CERVICOVAGINAL ANCILLARY ONLY
Bacterial Vaginitis (gardnerella): NEGATIVE
Candida Glabrata: NEGATIVE
Candida Vaginitis: NEGATIVE
Comment: NEGATIVE
Comment: NEGATIVE
Comment: NEGATIVE

## 2023-12-31 NOTE — Progress Notes (Unsigned)
 [facility] History and Physical   Primary Care Physician:  Tower, Laine LABOR, MD   Reason for Procedure:    Encounter Diagnosis  Name Primary?   Family history of colon cancer - brother in early 59's Yes     Plan:    Colonoscopy     HPI: Megan Harrison is a 68 y.o. female presenting for a repeat screening colonoscopy in the setting of a family history of colon cancer.  Last procedure was accomplished without difficulty.  Exam 2020 without polyps.  Did have sigmoid diverticulosis.   Past Medical History:  Diagnosis Date   Allergy 1990   Anemia    Arthritis    Cutaneous sarcoidosis    Degenerative disc disease    Diverticulosis    Dysplastic nevus 11/19/2017   L mid back 6.0 cm lat to spine   Foot pain    GERD (gastroesophageal reflux disease)    Glaucoma 2005   Hyperlipidemia    Malignant neoplasm of upper-outer quadrant of left breast in female, estrogen receptor negative (HCC) 11/04/2021   Migraine    Personal history of chemotherapy    Personal history of radiation therapy    Shingles    eyes    Past Surgical History:  Procedure Laterality Date   2D Echo  1999   Negative   ABDOMINAL HYSTERECTOMY  11/99   APPENDECTOMY  02-18-16   Arm lesion, cutaneous sarcoid  12/2005   with normal CXR and ANA   BREAST BIOPSY Left 10/23/2021   us  biopsy/ heart clip/ [facility]   BREAST LUMPECTOMY Left 2023   BREAST LUMPECTOMY WITH RADIOFREQUENCY TAG IDENTIFICATION Left 11/13/2021   lt br rf tag placement   BREAST LUMPECTOMY,RADIO FREQ LOCALIZER,AXILLARY SENTINEL LYMPH NODE BIOPSY Left 11/20/2021   Procedure: BREAST LUMPECTOMY,RADIO FREQ LOCALIZER,AXILLARY SENTINEL LYMPH NODE BIOPSY;  Surgeon: Desiderio Schanz, MD;  Location: [facility] ORS;  Service: General;  Laterality: Left;   BREAST SURGERY  November 18, 2021   Lumpectomy- left breast   CESAREAN SECTION     x2   COLONOSCOPY  11/0/2009,08/18/13   diverticulosis and hemorrhoids   DIAGNOSTIC LAPAROSCOPY     EYE SURGERY   10/13/2018   had shingles in left eye, had to have surgery, has contact lens in now.   FOOT NEUROMA SURGERY Left    LAPAROSCOPIC APPENDECTOMY N/A 02/18/2016   Procedure: APPENDECTOMY LAPAROSCOPIC;  Surgeon: Lynda Leos, MD;  Location: [facility] OR;  Service: General;  Laterality: N/A;   LUMBAR LAMINECTOMY  10/2017   PERIPHERAL VASCULAR THROMBECTOMY Right 01/16/2022   Procedure: PERIPHERAL VASCULAR THROMBECTOMY;  Surgeon: Marea Selinda GORMAN, MD;  Location: [facility] INV[facility];  Service: Cardiovascular;  Laterality: Right;   PORTA CATH REMOVAL N/A 02/28/2022   Procedure: PORTA CATH REMOVAL;  Surgeon: Marea Selinda GORMAN, MD;  Location: [facility];  Service: Cardiovascular;  Laterality: N/A;   PORTACATH PLACEMENT Right 12/05/2021   Procedure: INSERTION PORT-A-CATH;  Surgeon: Desiderio Schanz, MD;  Location: [facility] ORS;  Service: General;  Laterality: Right;   ROTATOR CUFF REPAIR Left    SPINE SURGERY  10/16/2017   TONSILLECTOMY     UPPER GASTROINTESTINAL ENDOSCOPY  11/15/2010   non-erosive gastritis, H. pylori negative     Current Outpatient Medications  Medication Sig Dispense Refill   acetaminophen  (TYLENOL ) 500 MG tablet Take 2 tablets (1,000 mg total) by mouth every 6 (six) hours as needed for mild pain.     aspirin EC 81 MG tablet Take 81 mg by mouth daily. Swallow  whole.     atorvastatin  (LIPITOR) 10 MG tablet Take 1 tablet (10 mg total) by mouth daily. 90 tablet 3   Cholecalciferol (VITAMIN D3) 50 MCG (2000 UT) TABS Take 1 tablet by mouth daily.     clobetasol  ointment (TEMOVATE ) 0.05 % Apply to vulva every other night indefinitely 30 g 5   estradiol  (ESTRACE ) 0.1 MG/GM vaginal cream Place 1 Applicatorful vaginally 2 (two) times a week.     fluticasone  (FLONASE ) 50 MCG/ACT nasal spray USE 1 SPRAY(S) IN EACH NOSTRIL ONCE DAILY AS DIRECTED 48 g 3   hydrochlorothiazide  (MICROZIDE ) 12.5 MG capsule Take 1 capsule (12.5 mg total) by mouth daily. 90 capsule 2   Multiple Vitamins-Minerals (ONE-A-DAY  WOMENS 50 PLUS PO) Take 1 tablet by mouth daily.     ofloxacin (OCUFLOX) 0.3 % ophthalmic solution Place 1 drop into the right eye 4 (four) times daily.     prednisoLONE acetate (PRED FORTE) 1 % ophthalmic suspension Place 1 drop into the left eye every other day.     Timolol Maleate 0.5 % (DAILY) SOLN Place 1 drop into the left eye daily at 6 (six) AM.     calcium  carbonate (TUMS - DOSED IN MG ELEMENTAL CALCIUM ) 500 MG chewable tablet Chew 1 tablet by mouth as needed for indigestion or heartburn.     ferrous sulfate  325 (65 FE) MG tablet Take 325 mg by mouth daily.     Magnesium Hydroxide (DULCOLAX PO) Take 1 tablet by mouth 2 (two) times daily.     meloxicam  (MOBIC ) 15 MG tablet TAKE 1 TABLET BY MOUTH ONCE DAILY AS NEEDED WITH FOOD FOR PAIN 90 tablet 1   Current Facility-Administered Medications  Medication Dose Route Frequency Provider Last Rate Last Admin   0.9 %  sodium chloride  infusion  500 mL Intravenous Continuous Avram Lupita BRAVO, MD       Facility-Administered Medications Ordered in Other Visits  Medication Dose Route Frequency Provider Last Rate Last Admin   heparin  lock flush 100 UNIT/ML injection            palonosetron  (ALOXI ) 0.25 MG/5ML injection             Allergies as of 01/01/2024 - Review Complete 01/01/2024  Allergen Reaction Noted   Sumatriptan Other (See Comments) 12/10/2007   Codeine Other (See Comments) 08/28/2006    Family History  Problem Relation Age of Onset   Heart disease Mother        CAD   Alcohol abuse Mother    Stroke Mother    Lung cancer Father    Cancer Father    Colon polyps Sister    Colon cancer Brother        early 62's   Stomach cancer Brother    Cancer Brother    Esophageal cancer Brother    Heart disease Brother        CAD   Throat cancer Brother    Cancer Brother    Heart disease Brother        CAD, pre diabetic, obese   Cancer Brother    Drug abuse Brother    Breast cancer Neg Hx    Rectal cancer Neg Hx     Social  History   Socioeconomic History   Marital status: Married    Spouse name: Not on file   Number of children: Not on file   Years of education: Not on file   Highest education level: Associate degree: occupational, Scientist, product/process development, or vocational program  Occupational History   Occupation: Works for [employer]    Employer: RETIRED  Tobacco Use   Smoking status: Never   Smokeless tobacco: Never  Vaping Use   Vaping status: Never Used  Substance and Sexual Activity   Alcohol use: No    Alcohol/week: 0.0 standard drinks of alcohol   Drug use: No   Sexual activity: Yes  Other Topics Concern   Not on file  Social History Narrative   Regular exercise:  Curves, elliptical, walking.   Social Drivers of Corporate investment banker Strain: Low Risk  (10/20/2023)   Overall Financial Resource Strain (CARDIA)    Difficulty of Paying Living Expenses: Not hard at all  Food Insecurity: No Food Insecurity (10/20/2023)   Hunger Vital Sign    Worried About Running Out of Food in the Last Year: Never true    Ran Out of Food in the Last Year: Never true  Transportation Needs: No Transportation Needs (10/20/2023)   PRAPARE - Administrator, Civil Service (Medical): No    Lack of Transportation (Non-Medical): No  Physical Activity: Sufficiently Active (10/20/2023)   Exercise Vital Sign    Days of Exercise per Week: 5 days    Minutes of Exercise per Session: 50 min  Stress: No Stress Concern Present (10/20/2023)   [employer] of Occupational Health - Occupational Stress Questionnaire    Feeling of Stress: Not at all  Social Connections: Socially Integrated (10/20/2023)   Social Connection and Isolation Panel    Frequency of Communication with Friends and Family: Twice a week    Frequency of Social Gatherings with Friends and Family: More than three times a week    Attends Religious Services: More than 4 times per year    Active Member of [employer] or Organizations: Yes    Attends Occupational hygienist Meetings: 1 to 4 times per year    Marital Status: Married  Catering manager Violence: Not At Risk (04/11/2023)   Humiliation, Afraid, Rape, and Kick questionnaire    Fear of Current or Ex-Partner: No    Emotionally Abused: No    Physically Abused: No    Sexually Abused: No    Review of Systems:  All other review of systems negative except as mentioned in the HPI.  Physical Exam: Vital signs BP (!) 155/84   Pulse (!) 58   Temp (!) 97.5 F (36.4 C)   Ht 5' 2.5 (1.588 m)   Wt 155 lb (70.3 kg)   SpO2 98%   BMI 27.90 kg/m   General:   Alert,  Well-developed, well-nourished, pleasant and cooperative in NAD Lungs:  Clear throughout to auscultation.   Heart:  Regular rate and rhythm; no murmurs, clicks, rubs,  or gallops. Abdomen:  Soft, nontender and nondistended. Normal bowel sounds.   Neuro/Psych:  Alert and cooperative. Normal mood and affect. A and O x 3   @Damaria Stofko  CHARLENA Commander, MD, [facility] [phone] (pager) 01/01/2024 9:37 AM@

## 2024-01-01 ENCOUNTER — Encounter: Payer: Self-pay | Admitting: Internal Medicine

## 2024-01-01 ENCOUNTER — Ambulatory Visit (AMBULATORY_SURGERY_CENTER): Admitting: Internal Medicine

## 2024-01-01 VITALS — BP 157/74 | HR 69 | Temp 97.5°F | Resp 21 | Ht 62.5 in | Wt 155.0 lb

## 2024-01-01 DIAGNOSIS — Z800 Family history of malignant neoplasm of digestive organs: Secondary | ICD-10-CM

## 2024-01-01 DIAGNOSIS — K644 Residual hemorrhoidal skin tags: Secondary | ICD-10-CM

## 2024-01-01 DIAGNOSIS — K5730 Diverticulosis of large intestine without perforation or abscess without bleeding: Secondary | ICD-10-CM | POA: Diagnosis not present

## 2024-01-01 DIAGNOSIS — Z1211 Encounter for screening for malignant neoplasm of colon: Secondary | ICD-10-CM

## 2024-01-01 MED ORDER — SODIUM CHLORIDE 0.9 % IV SOLN
500.0000 mL | INTRAVENOUS | Status: DC
Start: 2024-01-01 — End: 2024-01-01

## 2024-01-01 NOTE — Progress Notes (Signed)
 Pt's states no medical or surgical changes since previsit or office visit.

## 2024-01-01 NOTE — Progress Notes (Signed)
 Sedate, gd SR, tolerated procedure well, VSS, report to RN

## 2024-01-01 NOTE — Patient Instructions (Addendum)
 Resume previous diet. Continue present medications. Repeat colonoscopy in 5 years for screening purposes. Handouts provided on diverticulosis and hemorrhoids.   YOU HAD AN ENDOSCOPIC PROCEDURE TODAY AT THE [facility]:   Refer to the procedure report that was given to you for any specific questions about what was found during the examination.  If the procedure report does not answer your questions, please call your gastroenterologist to clarify.  If you requested that your care partner not be given the details of your procedure findings, then the procedure report has been included in a sealed envelope for you to review at your convenience later.  YOU SHOULD EXPECT: Some feelings of bloating in the abdomen. Passage of more gas than usual.  Walking can help get rid of the air that was put into your GI tract during the procedure and reduce the bloating. If you had a lower endoscopy (such as a colonoscopy or flexible sigmoidoscopy) you may notice spotting of blood in your stool or on the toilet paper. If you underwent a bowel prep for your procedure, you may not have a normal bowel movement for a few days.  Please Note:  You might notice some irritation and congestion in your nose or some drainage.  This is from the oxygen used during your procedure.  There is no need for concern and it should clear up in a day or so.  SYMPTOMS TO REPORT IMMEDIATELY:  Following lower endoscopy (colonoscopy or flexible sigmoidoscopy):  Excessive amounts of blood in the stool  Significant tenderness or worsening of abdominal pains  Swelling of the abdomen that is new, acute  Fever of 100F or higher  For urgent or emergent issues, a gastroenterologist can be reached at any hour by calling [phone]. Do not use MyChart messaging for urgent concerns.    DIET:  We do recommend a small meal at first, but then you may proceed to your regular diet.  Drink plenty of fluids but you should avoid alcoholic  beverages for 24 hours.  ACTIVITY:  You should plan to take it easy for the rest of today and you should NOT DRIVE or use heavy machinery until tomorrow (because of the sedation medicines used during the test).    FOLLOW UP: Our staff will call the number listed on your records the next business day following your procedure.  We will call around 7:15- 8:00 am to check on you and address any questions or concerns that you may have regarding the information given to you following your procedure. If we do not reach you, we will leave a message.     If any biopsies were taken you will be contacted by phone or by letter within the next 1-3 weeks.  Please call us  at [phone] if you have not heard about the biopsies in 3 weeks.    SIGNATURES/CONFIDENTIALITY: You and/or your care partner have signed paperwork which will be entered into your electronic medical record.  These signatures attest to the fact that that the information above on your After Visit Summary has been reviewed and is understood.  Full responsibility of the confidentiality of this discharge information lies with you and/or your care-partner.No polyps or cancer again! You still have diverticulosis. External hemorrhoids noted also.  I appreciate the opportunity to care for you. Lupita CHARLENA Commander, MD, [facility]

## 2024-01-01 NOTE — Op Note (Signed)
 [facility] Patient Name: Megan Harrison Procedure Date: 01/01/2024 9:34 AM MRN: [id number] Endoscopist: Lupita FORBES Commander , MD, [id number] Age: 68 Referring MD:  Date of Birth: 16-Dec-1955 Gender: Female Account #: [id number] Procedure:                Colonoscopy Indications:              Screening in patient at increased risk: Family                            history of 1st-degree relative with colorectal                            cancer - brother in early 39's Medicines:                Monitored Anesthesia Care Procedure:                Pre-Anesthesia Assessment:                           - Prior to the procedure, a History and Physical                            was performed, and patient medications and                            allergies were reviewed. The patient's tolerance of                            previous anesthesia was also reviewed. The risks                            and benefits of the procedure and the sedation                            options and risks were discussed with the patient.                            All questions were answered, and informed consent                            was obtained. Prior Anticoagulants: The patient has                            taken no anticoagulant or antiplatelet agents. ASA                            Grade Assessment: II - A patient with mild systemic                            disease. After reviewing the risks and benefits,                            the patient was deemed in satisfactory condition to  undergo the procedure.                           After obtaining informed consent, the colonoscope                            was passed under direct vision. Throughout the                            procedure, the patient's blood pressure, pulse, and                            oxygen saturations were monitored continuously. The                            PCF-HQ190L Colonoscope [id number]  was introduced                            through the anus and advanced to the the cecum,                            identified by appendiceal orifice and ileocecal                            valve. The colonoscopy was performed without                            difficulty. The patient tolerated the procedure                            well. The quality of the bowel preparation was                            good. The ileocecal valve, appendiceal orifice, and                            rectum were photographed. The bowel preparation                            used was SUPREP via split dose instruction. Scope In: 9:45:12 AM Scope Out: 9:55:44 AM Scope Withdrawal Time: 0 hours 6 minutes 48 seconds  Total Procedure Duration: 0 hours 10 minutes 32 seconds  Findings:                 Hemorrhoids were found on perianal exam.                           Multiple medium-mouthed and small-mouthed                            diverticula were found in the sigmoid colon.                           The exam was otherwise without abnormality on  direct and retroflexion views. Complications:            No immediate complications. Estimated Blood Loss:     Estimated blood loss: none. Impression:               - Hemorrhoids found on perianal exam.                           - Diverticulosis in the sigmoid colon.                           - The examination was otherwise normal on direct                            and retroflexion views.                           - No specimens collected. Recommendation:           - Patient has a contact number available for                            emergencies. The signs and symptoms of potential                            delayed complications were discussed with the                            patient. Return to normal activities tomorrow.                            Written discharge instructions were provided to the                             patient.                           - Resume previous diet.                           - Continue present medications.                           - Repeat colonoscopy in 5 years for screening                            purposes. FHx CRCA brother Lupita FORBES Commander, MD 01/01/2024 10:00:57 AM This report has been signed electronically.

## 2024-01-02 ENCOUNTER — Telehealth: Payer: Self-pay | Admitting: *Deleted

## 2024-01-02 NOTE — Telephone Encounter (Signed)
  Follow up Call-     01/01/2024    8:46 AM  Call back number  Post procedure Call Back phone  # [phone]  Permission to leave phone message Yes     Patient questions:  Do you have a fever, pain , or abdominal swelling? No. Pain Score  0 *  Have you tolerated food without any problems? Yes.    Have you been able to return to your normal activities? Yes.    Do you have any questions about your discharge instructions: Diet   No. Medications  No. Follow up visit  No.  Do you have questions or concerns about your Care? No.  Actions: * If pain score is 4 or above: No action needed, pain <4.

## 2024-01-21 ENCOUNTER — Other Ambulatory Visit: Payer: Self-pay | Admitting: Certified Nurse Midwife

## 2024-02-08 ENCOUNTER — Emergency Department (HOSPITAL_BASED_OUTPATIENT_CLINIC_OR_DEPARTMENT_OTHER)
Admission: EM | Admit: 2024-02-08 | Discharge: 2024-02-08 | Disposition: A | Discharge status: Home / Self Care | Attending: Emergency Medicine | Admitting: Emergency Medicine

## 2024-02-08 ENCOUNTER — Emergency Department (HOSPITAL_BASED_OUTPATIENT_CLINIC_OR_DEPARTMENT_OTHER)

## 2024-02-08 DIAGNOSIS — M79644 Pain in right finger(s): Secondary | ICD-10-CM | POA: Diagnosis not present

## 2024-02-08 DIAGNOSIS — M19041 Primary osteoarthritis, right hand: Secondary | ICD-10-CM | POA: Diagnosis not present

## 2024-02-08 DIAGNOSIS — Z7982 Long term (current) use of aspirin: Secondary | ICD-10-CM | POA: Diagnosis not present

## 2024-02-08 DIAGNOSIS — M79641 Pain in right hand: Secondary | ICD-10-CM | POA: Diagnosis not present

## 2024-02-08 NOTE — ED Provider Notes (Signed)
 [place] EMERGENCY DEPARTMENT AT [facility] Provider Note   CSN: [id number] Arrival date & time: 02/08/24  1414     Patient presents with: Hand Pain   Megan Harrison is a 68 y.o. female.    Hand Pain  Patient presents with right hand pain.  Around 2 weeks ago was hit in the hand while moving some furniture.  Now with pain that began around a week ago.  Had cleared up to pain in the middle.  Worse with certain movements.  No other injury.  Pain is in the thumb approximately and potentially some of the hand.    Past Medical History:  Diagnosis Date   Allergy 1990   Anemia    Arthritis    Cutaneous sarcoidosis    Degenerative disc disease    Diverticulosis    Dysplastic nevus 11/19/2017   L mid back 6.0 cm lat to spine   Foot pain    GERD (gastroesophageal reflux disease)    Glaucoma 2005   Hyperlipidemia    Malignant neoplasm of upper-outer quadrant of left breast in female, estrogen receptor negative (HCC) 11/04/2021   Migraine    Personal history of chemotherapy    Personal history of radiation therapy    Shingles    eyes    Prior to Admission medications   Medication Sig Start Date End Date Taking? Authorizing Provider  acetaminophen  (TYLENOL ) 500 MG tablet Take 2 tablets (1,000 mg total) by mouth every 6 (six) hours as needed for mild pain. 11/20/21   Desiderio Schanz, MD  aspirin EC 81 MG tablet Take 81 mg by mouth daily. Swallow whole.    [provider]  atorvastatin  (LIPITOR) 10 MG tablet Take 1 tablet (10 mg total) by mouth daily. 04/25/23   Tower, Laine LABOR, MD  calcium  carbonate (TUMS - DOSED IN MG ELEMENTAL CALCIUM ) 500 MG chewable tablet Chew 1 tablet by mouth as needed for indigestion or heartburn.    [provider]  Cholecalciferol (VITAMIN D3) 50 MCG (2000 UT) TABS Take 1 tablet by mouth daily.    [provider]  clobetasol  ointment (TEMOVATE ) 0.05 % Apply to vulva every other night indefinitely 12/22/23   Dove, Myra C,  MD  estradiol  (ESTRACE ) 0.1 MG/GM vaginal cream PLACE 1 APPLICATION VAGINALLY AT BEDTIME FOR 14 DAYS THEN REDUCE TO TWICE WEEKLY 01/22/24   Starla Harland BROCKS, MD  ferrous sulfate  325 (65 FE) MG tablet Take 325 mg by mouth daily.    [provider]  fluticasone  (FLONASE ) 50 MCG/ACT nasal spray USE 1 SPRAY(S) IN EACH NOSTRIL ONCE DAILY AS DIRECTED 04/25/23   Tower, Laine LABOR, MD  hydrochlorothiazide  (MICROZIDE ) 12.5 MG capsule Take 1 capsule (12.5 mg total) by mouth daily. 10/24/23   Tower, Laine LABOR, MD  Magnesium Hydroxide (DULCOLAX PO) Take 1 tablet by mouth 2 (two) times daily.    [provider]  meloxicam  (MOBIC ) 15 MG tablet TAKE 1 TABLET BY MOUTH ONCE DAILY AS NEEDED WITH FOOD FOR PAIN 11/14/23   Tower, Laine LABOR, MD  Multiple Vitamins-Minerals (ONE-A-DAY WOMENS 50 PLUS PO) Take 1 tablet by mouth daily.    [provider]  ofloxacin (OCUFLOX) 0.3 % ophthalmic solution Place 1 drop into the right eye 4 (four) times daily. 10/30/23   [provider]  prednisoLONE acetate (PRED FORTE) 1 % ophthalmic suspension Place 1 drop into the left eye every other day.    [provider]  Timolol Maleate 0.5 % (DAILY) SOLN Place 1  drop into the left eye daily at 6 (six) AM. 08/16/16   [provider]    Allergies: Sumatriptan and Codeine    Review of Systems  Updated Vital Signs BP 139/74   Pulse 70   Temp (!) 97.4 F (36.3 C) (Temporal)   Resp 16   SpO2 99%   Physical Exam Vitals and nursing note reviewed.  Cardiovascular:     Rate and Rhythm: Regular rhythm.  Musculoskeletal:        General: Tenderness present.     Cervical back: Neck supple.     Comments: Tenderness to the right thumb somewhat proximally.  No deformity.  Good range of motion.  Stable.  No tenderness over snuffbox.  No proximal metacarpal tenderness.  Skin:    Capillary Refill: Capillary refill takes less than 2 seconds.  Neurological:     Mental Status: She is alert and oriented to  person, place, and time.     (all labs ordered are listed, but only abnormal results are displayed) Labs Reviewed - No data to display  EKG: None  Radiology: DG Hand Complete Right Result Date: 02/08/2024 CLINICAL DATA:  Injury to right thumb.  Right hand pain. EXAM: RIGHT HAND - COMPLETE 3+ VIEW COMPARISON:  None Available. FINDINGS: There is no evidence of acute fracture or dislocation. Degenerative changes are present at the interphalangeal joints and first metacarpal-carpal joint. The soft tissues are within normal limits. IMPRESSION: No acute fracture or dislocation. Electronically Signed   By: Leita Birmingham M.D.   On: 02/08/2024 16:58     Procedures   Medications Ordered in the ED - No data to display                                  Medical Decision Making Amount and/or Complexity of Data Reviewed Radiology: ordered.   Patient right hand pain.  Recent contusion.  Pain had improved however.  Will get x-ray to rule out occult fractures.  X-ray reassuring.  Doubt severe injury.  Symptomatic treatment.     Final diagnoses:  Thumb pain, right    ED Discharge Orders     None          Patsey Lot, MD 02/08/24 [phone]

## 2024-02-08 NOTE — ED Triage Notes (Signed)
 Pt reports right hand pain, worse with gripping, for past 3-4 days.  Pt states 2 weeks ago, she had a traumatic hand injury with a piece of furniture hitting her right hang, leaving bruising and pain - but that the pain from that had subsided after 1 week.

## 2024-02-08 NOTE — ED Notes (Signed)
 Dc instructions reviewed with patient. Patient voiced understanding. Dc with belongings.

## 2024-02-26 ENCOUNTER — Ambulatory Visit: Payer: Self-pay

## 2024-02-26 NOTE — Telephone Encounter (Signed)
 FYI Only or Action Required?: FYI only for provider.  Patient was last seen in primary care on 10/24/2023 by Randeen Laine LABOR, MD.  Called Nurse Triage reporting Hand Pain.  Symptoms began several weeks ago.  Interventions attempted: OTC medications: tylenol .  Symptoms are: gradually worsening.  Triage Disposition: See PCP When Office is Open (Within 3 Days)  Patient/caregiver understands and will follow disposition?: Yes, will follow disposition  Copied from CRM #[id number]. Topic: Clinical - Red Word Triage >> Feb 26, 2024  9:45 AM Jasmin G wrote: [facility] that prompted transfer to Nurse Triage: Pt states that she injured her hand recently and got X-Rays done about 2 weeks ago, pt was told that if pain would not go away she should come back for an appt, pt states that her pain in fact has been worsening and initially called to schedule a follow up appt with Dr. Randeen. Reason for Disposition  [1] MODERATE pain (e.g., interferes with normal activities) AND [2] present > 3 days  Answer Assessment - Initial Assessment Questions 1. ONSET: When did the pain start?     Ongoing for atleast 2 weeks 2. LOCATION: Where is the pain located?     R 3. PAIN: How bad is the pain? (Scale 1-10; or mild, moderate, severe)     6 4. WORK OR EXERCISE: Has there been any recent work or exercise that involved this part (i.e., hand or wrist) of the body?     Original injury pt was pushing dolly and item pushed back 5. CAUSE: What do you think is causing the pain?     Pt states that she has had xr  6. AGGRAVATING FACTORS: What makes the pain worse? (e.g., using computer)     Using the hand makes it worse 7. OTHER SYMPTOMS: Do you have any other symptoms? (e.g., fever, neck pain, numbness or tingling, rash, swelling)     Worse with movement, denies swelling, denies discoloration, denies numbness/tingling.  Protocols used: Hand Pain-A-AH

## 2024-02-26 NOTE — Telephone Encounter (Signed)
 Appt scheduled with Ginger, NP on Monday, will route to her and PCP

## 2024-02-26 NOTE — Telephone Encounter (Signed)
 Yes- of course, put her with me !   Thanks

## 2024-02-26 NOTE — Telephone Encounter (Signed)
 Aware, will watch for correspondence Thanks for seeing her

## 2024-02-26 NOTE — Telephone Encounter (Signed)
 I am happy to see her of course, however her appt with me is at 1200 and you have a 1230 same day open. I was curious if you'd prefer to see your own patient?

## 2024-02-27 NOTE — Telephone Encounter (Signed)
 Spoke to patient and moved her to Tower at 12:30 pm on Monday 10.27.25

## 2024-03-01 ENCOUNTER — Encounter: Payer: Self-pay | Admitting: Family Medicine

## 2024-03-01 ENCOUNTER — Ambulatory Visit: Payer: Self-pay | Admitting: Family Medicine

## 2024-03-01 ENCOUNTER — Ambulatory Visit
Admission: RE | Admit: 2024-03-01 | Discharge: 2024-03-01 | Disposition: A | Source: Ambulatory Visit | Attending: Family Medicine | Admitting: Family Medicine

## 2024-03-01 ENCOUNTER — Ambulatory Visit: Admitting: Family

## 2024-03-01 ENCOUNTER — Ambulatory Visit: Admitting: Family Medicine

## 2024-03-01 VITALS — BP 130/62 | HR 66 | Temp 98.2°F | Ht 62.5 in | Wt 165.5 lb

## 2024-03-01 DIAGNOSIS — M25531 Pain in right wrist: Secondary | ICD-10-CM | POA: Diagnosis not present

## 2024-03-01 DIAGNOSIS — M79641 Pain in right hand: Secondary | ICD-10-CM

## 2024-03-01 DIAGNOSIS — S6991XS Unspecified injury of right wrist, hand and finger(s), sequela: Secondary | ICD-10-CM

## 2024-03-01 DIAGNOSIS — S6991XA Unspecified injury of right wrist, hand and finger(s), initial encounter: Secondary | ICD-10-CM

## 2024-03-01 DIAGNOSIS — S6990XA Unspecified injury of unspecified wrist, hand and finger(s), initial encounter: Secondary | ICD-10-CM | POA: Insufficient documentation

## 2024-03-01 DIAGNOSIS — M79643 Pain in unspecified hand: Secondary | ICD-10-CM | POA: Insufficient documentation

## 2024-03-01 DIAGNOSIS — Z23 Encounter for immunization: Secondary | ICD-10-CM

## 2024-03-01 DIAGNOSIS — M79631 Pain in right forearm: Secondary | ICD-10-CM | POA: Diagnosis not present

## 2024-03-01 NOTE — Assessment & Plan Note (Signed)
 2-3 wk ago  Pushing furniture off dolly- slid back and hit base of right thumb  See a/p for hand pain

## 2024-03-01 NOTE — Patient Instructions (Signed)
 Xray of hand and wrist today  We will reach out with result and plan  Use ice whenever possible for 10 minutes  You can try voltaren gel over the counter up to 4 times daily

## 2024-03-01 NOTE — Assessment & Plan Note (Addendum)
 2-3 weeks following trauma  Swelling improved/ bruising resolved Reviewed UC records, lab results and studies in detail  (neg hand film then)   Today tender at base of first mcp  Pain to grip and abduct/adduct thumb against resistance  May have slight soft tissue swelling Normal rom of wrist (reassuring)  Re image today to look for healing fractures  No fractures seen  Want to r/o scaphoid injury  Referral to hand specialist   Call back and Er precautions noted in detail today   Encouraged to try voltaren gel/ice  Avoid painful activities Pt declined splint prior

## 2024-03-01 NOTE — Progress Notes (Signed)
 Subjective:    Patient ID: Megan Harrison Payment, female    DOB: January 29, 1956, 68 y.o.   MRN: [id number]  HPI  Wt Readings from Last 3 Encounters:  03/01/24 165 lb 8 oz (75.1 kg)  01/01/24 155 lb (70.3 kg)  12/22/23 157 lb 11.2 oz (71.5 kg)   29.79 kg/m  Vitals:   03/01/24 1217 03/01/24 1246  BP: (!) 144/72 130/62  Pulse: 66   Temp: 98.2 F (36.8 C)   SpO2: 97%     Pt presents with c/o Right hand pain   Injured it 2-3wk ago -pushing a dolly- moving furniture- slid back and hit palm of the hand at base of thumb Bruised - then faded , sore  Was seen in UC 10/5  Worse/not better  Is right handed   Pain in snuff box area/base of thumb  Dull ache most of the time  Sharp pain with palpate it   Wrist is ok   Worse to  Grip Hold a large cup Lifting a plate or platter   Fine to pronate/supinate   Just a little swollen   No numbness  Bruising is gone   Did not wear a splint    Had xray DG Wrist Complete Right Result Date: 03/01/2024 EXAM: 3 OR MORE VIEW(S) XRAY OF THE RIGHT WRIST 03/01/2024 12:58:44 PM COMPARISON: None available. CLINICAL HISTORY: pain in hand/ base of thumb / distal radius since injury 2-3 weeks ago r/o fracture. pain in right hand/ base of thumb / distal radius since injury 2-3 weeks ago r/o fracture FINDINGS: BONES AND JOINTS: No acute fracture. No focal osseous lesion. No joint dislocation. Mild thumb carpometacarpal joint space narrowing, subchondral sclerosis, peripheral osteophytosis consistent with osteoarthritis. SOFT TISSUES: The soft tissues are unremarkable. IMPRESSION: 1. No acute fracture. Electronically signed by: Waddell Calk MD 03/01/2024 02:38 PM EDT RP Workstation: [id number]   DG Hand Complete Right Result Date: 03/01/2024 EXAM: 3 OR MORE VIEW(S) XRAY OF THE RIGHT HAND 03/01/2024 12:58:44 PM COMPARISON: 02/08/2024 CLINICAL HISTORY: pain in hand/ base of thumb / distal radius since injury 2-3 weeks ago r/o fracture. pain in right hand/  base of thumb / distal radius since injury 2-3 weeks ago r/o fracture FINDINGS: BONES AND JOINTS: No acute fracture. Mild thumb carpometacarpal joint space narrowing, subchondral sclerosis, and peripheral osteophytosis. Mild-to-moderate second and third distal interphalangeal (DIP) joint space narrowing and peripheral osteophytosis. SOFT TISSUES: The soft tissues are unremarkable. IMPRESSION: 1. No acute osseous abnormality. Electronically signed by: Waddell Calk MD 03/01/2024 02:37 PM EDT RP Workstation: [id number]   DG Hand Complete Right Result Date: 02/08/2024 CLINICAL DATA:  Injury to right thumb.  Right hand pain. EXAM: RIGHT HAND - COMPLETE 3+ VIEW COMPARISON:  None Available. FINDINGS: There is no evidence of acute fracture or dislocation. Degenerative changes are present at the interphalangeal joints and first metacarpal-carpal joint. The soft tissues are within normal limits. IMPRESSION: No acute fracture or dislocation. Electronically Signed   By: Leita Waddell M.D.   On: 02/08/2024 16:58    Meloxicam -twice weekly for migraine   Takes 2 arthritis tylenol  bid (occational tid) for headache/other pain/migraine   Lab Results  Component Value Date   ALT 13 04/25/2023   AST 18 04/25/2023   ALKPHOS 60 04/11/2022   BILITOT 0.3 04/25/2023   Lab Results  Component Value Date   NA 140 10/24/2023   K 3.5 10/24/2023   CO2 31 10/24/2023   GLUCOSE 89 10/24/2023   BUN 25 (H) 10/24/2023  CREATININE 0.62 10/24/2023   CALCIUM  9.8 10/24/2023   GFR 90.48 10/24/2023   GFRNONAA >60 04/01/2022    DG Wrist Complete Right Result Date: 03/01/2024 EXAM: 3 OR MORE VIEW(S) XRAY OF THE RIGHT WRIST 03/01/2024 12:58:44 PM COMPARISON: None available. CLINICAL HISTORY: pain in hand/ base of thumb / distal radius since injury 2-3 weeks ago r/o fracture. pain in right hand/ base of thumb / distal radius since injury 2-3 weeks ago r/o fracture FINDINGS: BONES AND JOINTS: No acute fracture. No focal osseous  lesion. No joint dislocation. Mild thumb carpometacarpal joint space narrowing, subchondral sclerosis, peripheral osteophytosis consistent with osteoarthritis. SOFT TISSUES: The soft tissues are unremarkable. IMPRESSION: 1. No acute fracture. Electronically signed by: Waddell Calk MD 03/01/2024 02:38 PM EDT RP Workstation: [id number]   DG Hand Complete Right Result Date: 03/01/2024 EXAM: 3 OR MORE VIEW(S) XRAY OF THE RIGHT HAND 03/01/2024 12:58:44 PM COMPARISON: 02/08/2024 CLINICAL HISTORY: pain in hand/ base of thumb / distal radius since injury 2-3 weeks ago r/o fracture. pain in right hand/ base of thumb / distal radius since injury 2-3 weeks ago r/o fracture FINDINGS: BONES AND JOINTS: No acute fracture. Mild thumb carpometacarpal joint space narrowing, subchondral sclerosis, and peripheral osteophytosis. Mild-to-moderate second and third distal interphalangeal (DIP) joint space narrowing and peripheral osteophytosis. SOFT TISSUES: The soft tissues are unremarkable. IMPRESSION: 1. No acute osseous abnormality. Electronically signed by: Waddell Calk MD 03/01/2024 02:37 PM EDT RP Workstation: [id number]   DG Hand Complete Right Result Date: 02/08/2024 CLINICAL DATA:  Injury to right thumb.  Right hand pain. EXAM: RIGHT HAND - COMPLETE 3+ VIEW COMPARISON:  None Available. FINDINGS: There is no evidence of acute fracture or dislocation. Degenerative changes are present at the interphalangeal joints and first metacarpal-carpal joint. The soft tissues are within normal limits. IMPRESSION: No acute fracture or dislocation. Electronically Signed   By: Leita Waddell M.D.   On: 02/08/2024 16:58      Patient Active Problem List   Diagnosis Date Noted   Hand pain 03/01/2024   Hand injury 03/01/2024   Colon cancer screening 10/24/2023   Benign paroxysmal positional vertigo 10/13/2023   Essential hypertension 04/27/2023   History of breast cancer 04/25/2023   History of vaginitis 10/31/2022   Neuroma of  foot 04/11/2022   History of DVT (deep vein thrombosis) 01/15/2022   Goals of care, counseling/discussion 11/30/2021   Malignant neoplasm of upper-outer quadrant of left breast in female, estrogen receptor negative (HCC) 11/04/2021   Osteopenia 01/31/2019   Estrogen deficiency 01/18/2019   Family history of colon cancer - brother in early 60's 10/21/2018   Encounter for screening mammogram for breast cancer 05/19/2017   Hyperlipidemia 12/10/2016   Encounter for routine gynecological examination 11/04/2014   Hemorrhoids, internal 04/02/2013   Routine general medical examination at a health care facility 06/09/2011   ALLERGIC RHINITIS 03/10/2009   History of shingles 08/28/2006   Past Medical History:  Diagnosis Date   Allergy 1990   Anemia    Arthritis    Cutaneous sarcoidosis (HCC)    Degenerative disc disease    Diverticulosis    Dysplastic nevus 11/19/2017   L mid back 6.0 cm lat to spine   Foot pain    GERD (gastroesophageal reflux disease)    Glaucoma 2005   Hyperlipidemia    Malignant neoplasm of upper-outer quadrant of left breast in female, estrogen receptor negative (HCC) 11/04/2021   Migraine    Personal history of chemotherapy    Personal history of radiation  therapy    Shingles    eyes   Past Surgical History:  Procedure Laterality Date   2D Echo  1999   Negative   ABDOMINAL HYSTERECTOMY  11/99   APPENDECTOMY  02-18-16   Arm lesion, cutaneous sarcoid  12/2005   with normal CXR and ANA   BREAST BIOPSY Left 10/23/2021   us  biopsy/ heart clip/ [facility]   BREAST LUMPECTOMY Left 2023   BREAST LUMPECTOMY WITH RADIOFREQUENCY TAG IDENTIFICATION Left 11/13/2021   lt br rf tag placement   BREAST LUMPECTOMY,RADIO FREQ LOCALIZER,AXILLARY SENTINEL LYMPH NODE BIOPSY Left 11/20/2021   Procedure: BREAST LUMPECTOMY,RADIO FREQ LOCALIZER,AXILLARY SENTINEL LYMPH NODE BIOPSY;  Surgeon: Desiderio Schanz, MD;  Location: [facility] ORS;  Service: General;  Laterality: Left;   BREAST SURGERY   November 18, 2021   Lumpectomy- left breast   CESAREAN SECTION     x2   COLONOSCOPY  11/0/2009,08/18/13   diverticulosis and hemorrhoids   DIAGNOSTIC LAPAROSCOPY     EYE SURGERY  10/13/2018   had shingles in left eye, had to have surgery, has contact lens in now.   FOOT NEUROMA SURGERY Left    LAPAROSCOPIC APPENDECTOMY N/A 02/18/2016   Procedure: APPENDECTOMY LAPAROSCOPIC;  Surgeon: Lynda Leos, MD;  Location: [facility] OR;  Service: General;  Laterality: N/A;   LUMBAR LAMINECTOMY  10/2017   PERIPHERAL VASCULAR THROMBECTOMY Right 01/16/2022   Procedure: PERIPHERAL VASCULAR THROMBECTOMY;  Surgeon: Marea Selinda RAMAN, MD;  Location: [facility];  Service: Cardiovascular;  Laterality: Right;   PORTA CATH REMOVAL N/A 02/28/2022   Procedure: PORTA CATH REMOVAL;  Surgeon: Marea Selinda RAMAN, MD;  Location: [facility];  Service: Cardiovascular;  Laterality: N/A;   PORTACATH PLACEMENT Right 12/05/2021   Procedure: INSERTION PORT-A-CATH;  Surgeon: Desiderio Schanz, MD;  Location: [facility] OR[facility];  Service: General;  Laterality: Right;   ROTATOR CUFF REPAIR Left    SPINE SURGERY  10/16/2017   TONSILLECTOMY     UPPER GASTROINTESTINAL ENDOSCOPY  11/15/2010   non-erosive gastritis, H. pylori negative   Social History   Tobacco Use   Smoking status: Never   Smokeless tobacco: Never  Vaping Use   Vaping status: Never Used  Substance Use Topics   Alcohol use: No    Alcohol/week: 0.0 standard drinks of alcohol   Drug use: No   Family History  Problem Relation Age of Onset   Heart disease Mother        CAD   Alcohol abuse Mother    Stroke Mother    Lung cancer Father    Cancer Father    Colon polyps Sister    Colon cancer Brother        early 36's   Stomach cancer Brother    Cancer Brother    Esophageal cancer Brother    Heart disease Brother        CAD   Throat cancer Brother    Cancer Brother    Heart disease Brother        CAD, pre diabetic, obese   Cancer Brother    Drug abuse Brother     Breast cancer Neg Hx    Rectal cancer Neg Hx    Allergies  Allergen Reactions   Sumatriptan Other (See Comments)    REACTION: choking sensation; trouble swallowing   Codeine Other (See Comments)    REACTION: syncope; Patient states that she had syncope as a child, but has had Codeine since then with no reaction   Current Outpatient Medications on  File Prior to Visit  Medication Sig Dispense Refill   acetaminophen  (TYLENOL ) 500 MG tablet Take 2 tablets (1,000 mg total) by mouth every 6 (six) hours as needed for mild pain.     aspirin EC 81 MG tablet Take 81 mg by mouth daily. Swallow whole.     atorvastatin  (LIPITOR) 10 MG tablet Take 1 tablet (10 mg total) by mouth daily. 90 tablet 3   calcium  carbonate (TUMS - DOSED IN MG ELEMENTAL CALCIUM ) 500 MG chewable tablet Chew 1 tablet by mouth as needed for indigestion or heartburn.     Cholecalciferol (VITAMIN D3) 50 MCG (2000 UT) TABS Take 1 tablet by mouth daily.     clobetasol  ointment (TEMOVATE ) 0.05 % Apply to vulva every other night indefinitely 30 g 5   estradiol  (ESTRACE ) 0.1 MG/GM vaginal cream PLACE 1 APPLICATION VAGINALLY AT BEDTIME FOR 14 DAYS THEN REDUCE TO TWICE WEEKLY 43 g 0   ferrous sulfate  325 (65 FE) MG tablet Take 325 mg by mouth daily.     fluticasone  (FLONASE ) 50 MCG/ACT nasal spray USE 1 SPRAY(S) IN EACH NOSTRIL ONCE DAILY AS DIRECTED 48 g 3   hydrochlorothiazide  (MICROZIDE ) 12.5 MG capsule Take 1 capsule (12.5 mg total) by mouth daily. 90 capsule 2   Magnesium Hydroxide (DULCOLAX PO) Take 1 tablet by mouth 2 (two) times daily.     meloxicam  (MOBIC ) 15 MG tablet TAKE 1 TABLET BY MOUTH ONCE DAILY AS NEEDED WITH FOOD FOR PAIN 90 tablet 1   Multiple Vitamins-Minerals (ONE-A-DAY WOMENS 50 PLUS PO) Take 1 tablet by mouth daily.     prednisoLONE acetate (PRED FORTE) 1 % ophthalmic suspension Place 1 drop into the left eye every other day.     Timolol Maleate 0.5 % (DAILY) SOLN Place 1 drop into the left eye daily at 6 (six)  AM.     Current Facility-Administered Medications on File Prior to Visit  Medication Dose Route Frequency Provider Last Rate Last Admin   heparin  lock flush 100 UNIT/ML injection            palonosetron  (ALOXI ) 0.25 MG/5ML injection             Review of Systems  Constitutional:  Negative for fatigue and fever.  Musculoskeletal:        Right hand/thumb pain  Neurological:  Positive for headaches. Negative for weakness and numbness.       Objective:   Physical Exam Constitutional:      Appearance: Normal appearance.     Comments: Overweight   HENT:     Head: Normocephalic.     Mouth/Throat:     Mouth: Mucous membranes are moist.     Pharynx: Oropharynx is clear.  Cardiovascular:     Rate and Rhythm: Normal rate and regular rhythm.  Pulmonary:     Effort: Pulmonary effort is normal. No respiratory distress.     Breath sounds: No wheezing.  Musculoskeletal:     Right hand: Swelling, tenderness and bony tenderness present. Normal range of motion. Normal strength. Normal sensation. There is no disruption of two-point discrimination. Normal capillary refill. Normal pulse.     Cervical back: Neck supple.     Comments: Right hand  Right thenar area is slightly larger than left -? Swelling vs anatomic  Pt is right handed  Mild tenderness over base of first MCP (palmar side) in thenar area  Some mild tenderness in snuffbox area  Worse to abduct and adduct thumb   No neuro changes  Skin:    General: Skin is warm and dry.     Coloration: Skin is not pale.     Findings: No bruising, erythema or rash.  Neurological:     Mental Status: She is alert.     Sensory: No sensory deficit.     Motor: No weakness.  Psychiatric:        Mood and Affect: Mood normal.           Assessment & Plan:   Problem List Items Addressed This Visit       Other   Hand pain - Primary   2-3 weeks following trauma  Swelling improved/ bruising resolved Reviewed UC records, lab results  and studies in detail  (neg hand film then)   Today tender at base of first mcp  Pain to grip and abduct/adduct thumb against resistance  May have slight soft tissue swelling Normal rom of wrist (reassuring)  Re image today to look for healing fractures  No fractures seen  Want to r/o scaphoid injury  Referral to hand specialist   Call back and Er precautions noted in detail today   Encouraged to try voltaren gel/ice  Avoid painful activities Pt declined splint prior       Relevant Orders   DG Hand Complete Right (Completed)   DG Wrist Complete Right (Completed)   Hand injury   2-3 wk ago  Pushing furniture off dolly- slid back and hit base of right thumb  See a/p for hand pain       Relevant Orders   DG Hand Complete Right (Completed)   DG Wrist Complete Right (Completed)   Other Visit Diagnoses       Need for influenza vaccination       Relevant Orders   Flu vaccine HIGH DOSE PF(Fluzone Trivalent) (Completed)

## 2024-03-02 ENCOUNTER — Other Ambulatory Visit: Payer: Self-pay | Admitting: Obstetrics & Gynecology

## 2024-03-03 IMAGING — MG MM DIGITAL DIAGNOSTIC UNILAT*L* W/ TOMO W/ CAD
6 series · 6 of 18 positions shown · non-contrast
Comparison: Previous exam(s).

CLINICAL DATA: Recall from screening mammogram for an asymmetry in
the left breast.

EXAM:
DIGITAL DIAGNOSTIC UNILATERAL LEFT MAMMOGRAM WITH TOMOSYNTHESIS AND
CAD; ULTRASOUND LEFT BREAST LIMITED
TECHNIQUE: Left digital diagnostic mammography and breast tomosynthesis was
performed. The images were evaluated with computer-aided detection.;
Targeted ultrasound examination of the left breast was performed.

[L MLO synth-2D]
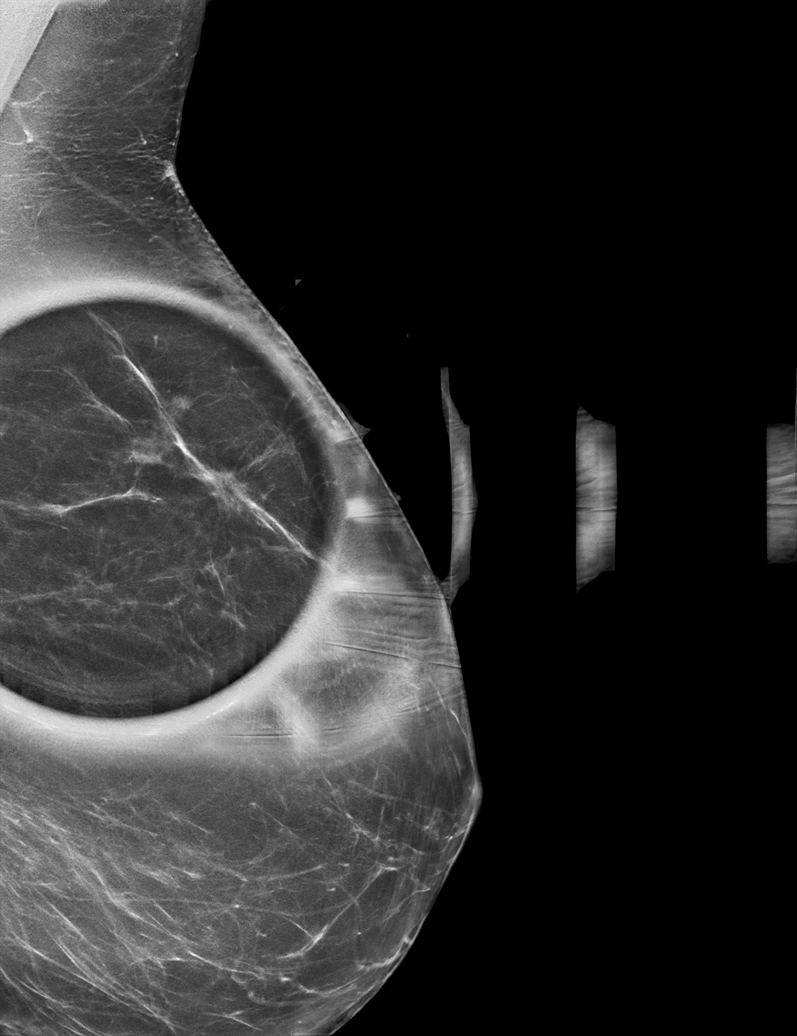

[L ML synth-2D]
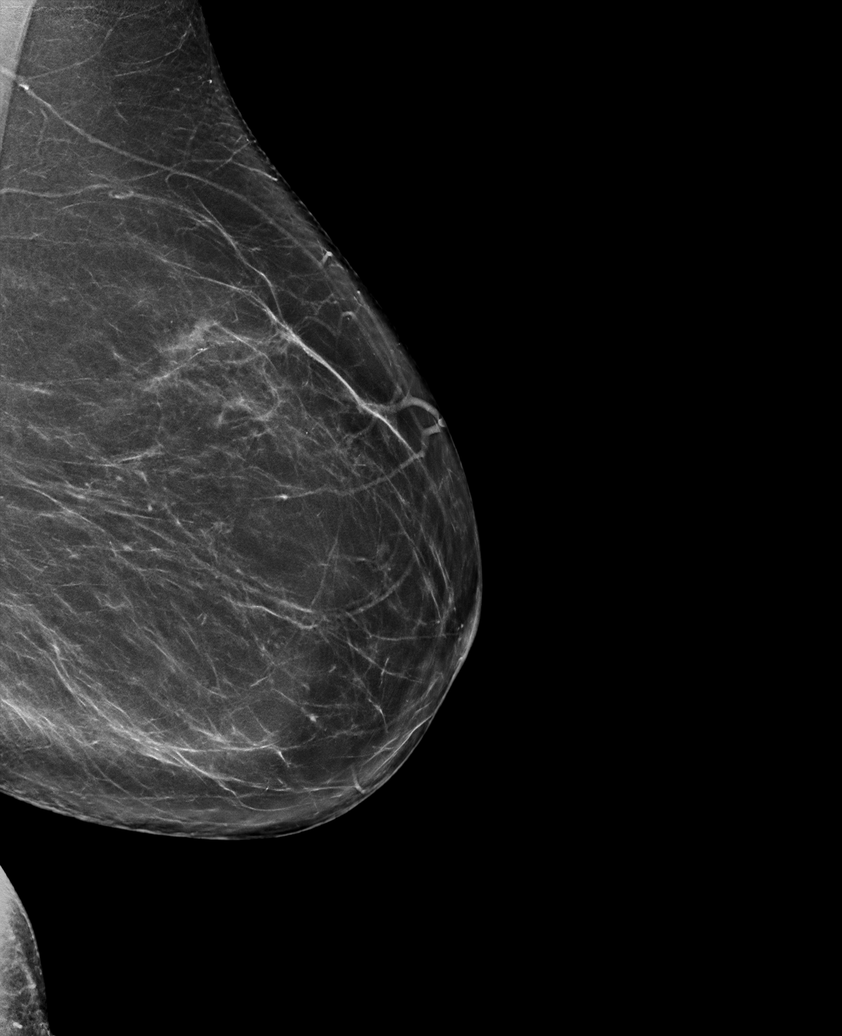

[L CC synth-2D]
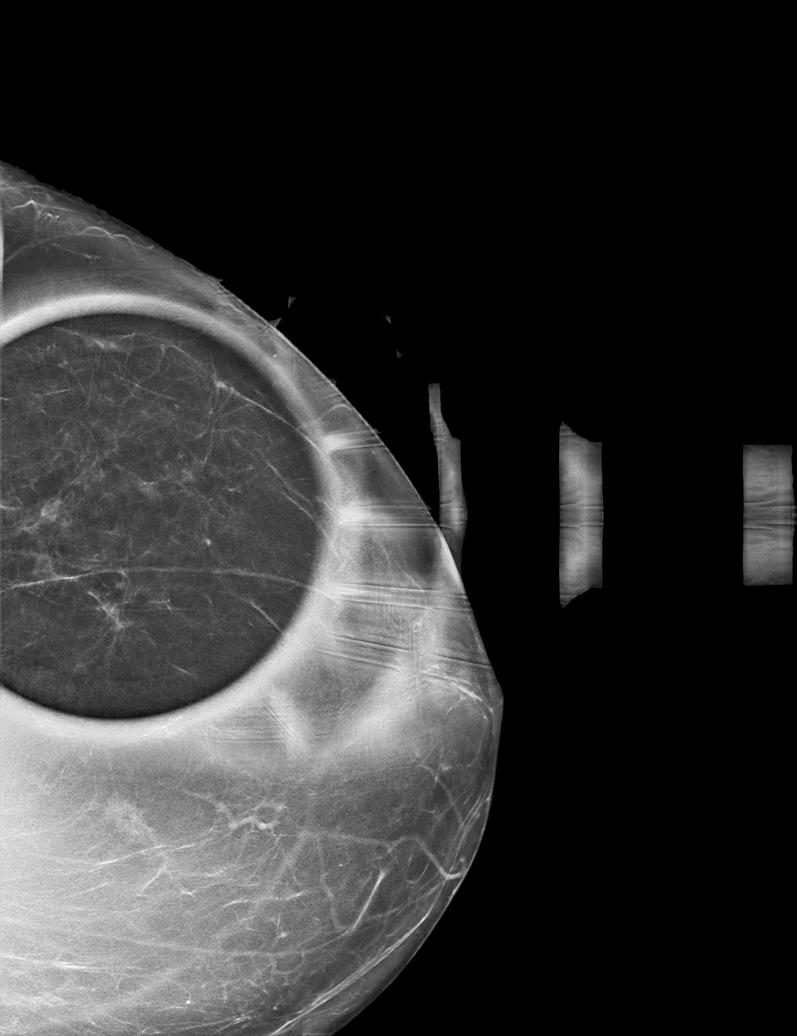

[L ML tomo · tomo slice 40/79.0]
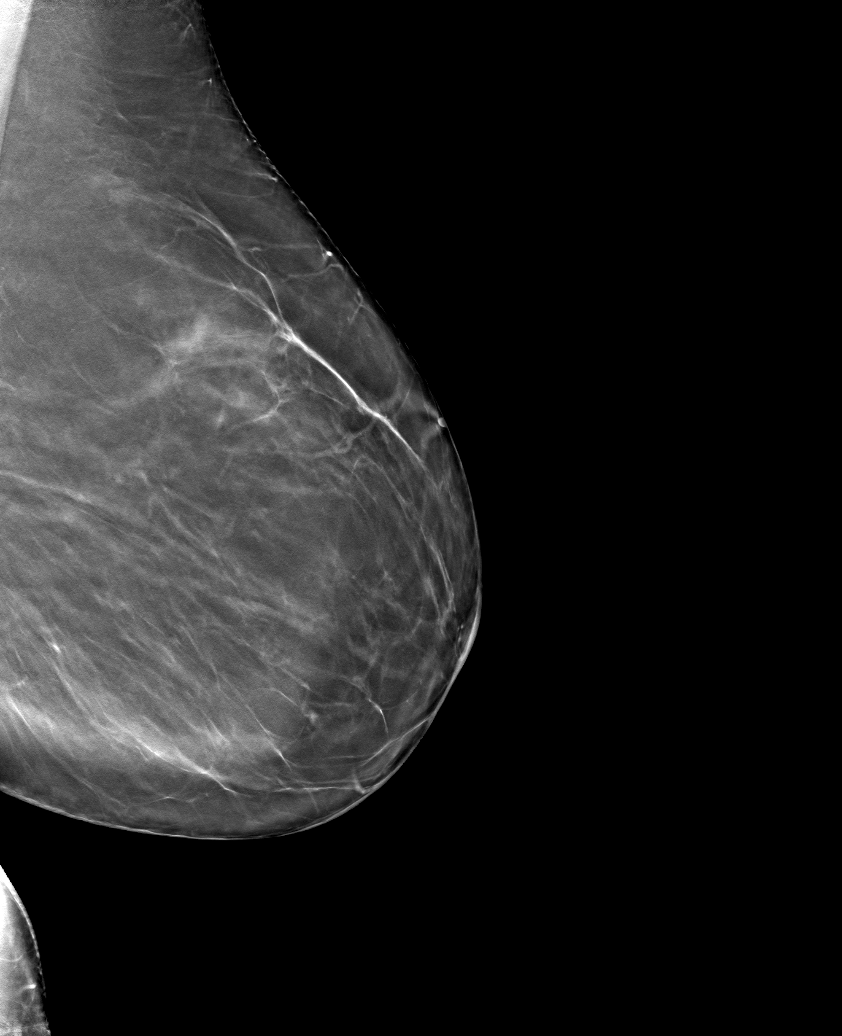

[L MLO tomo · tomo slice 37/74.0]
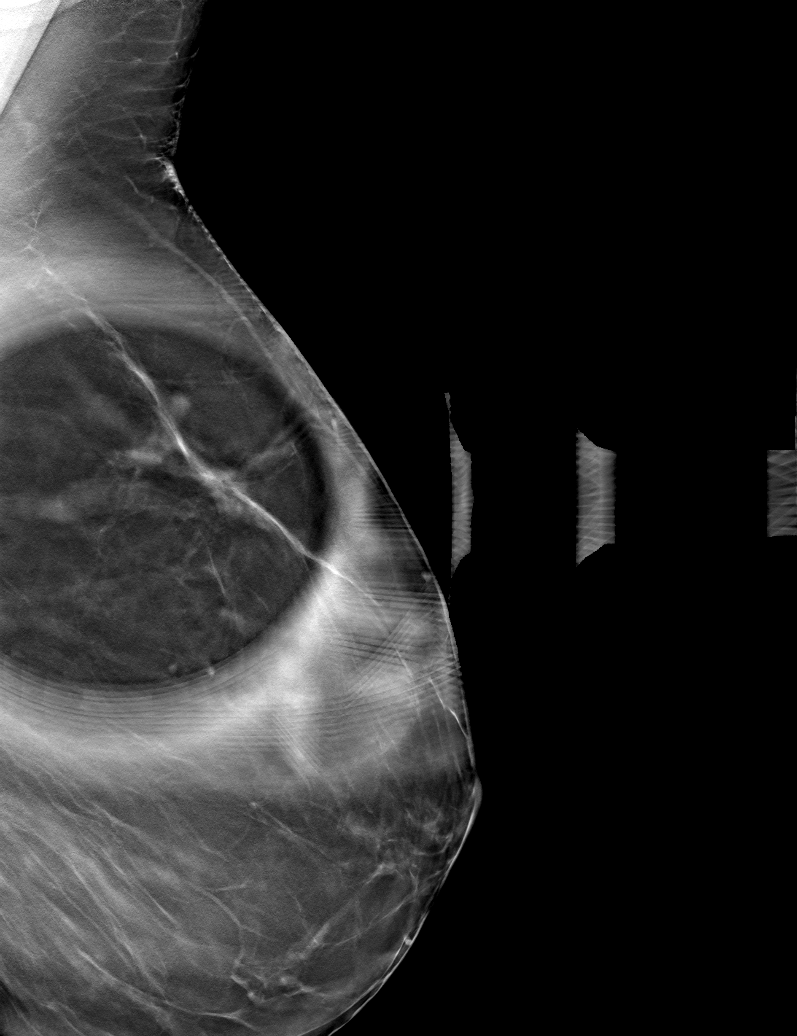

[L CC tomo · tomo slice 29/58.0]
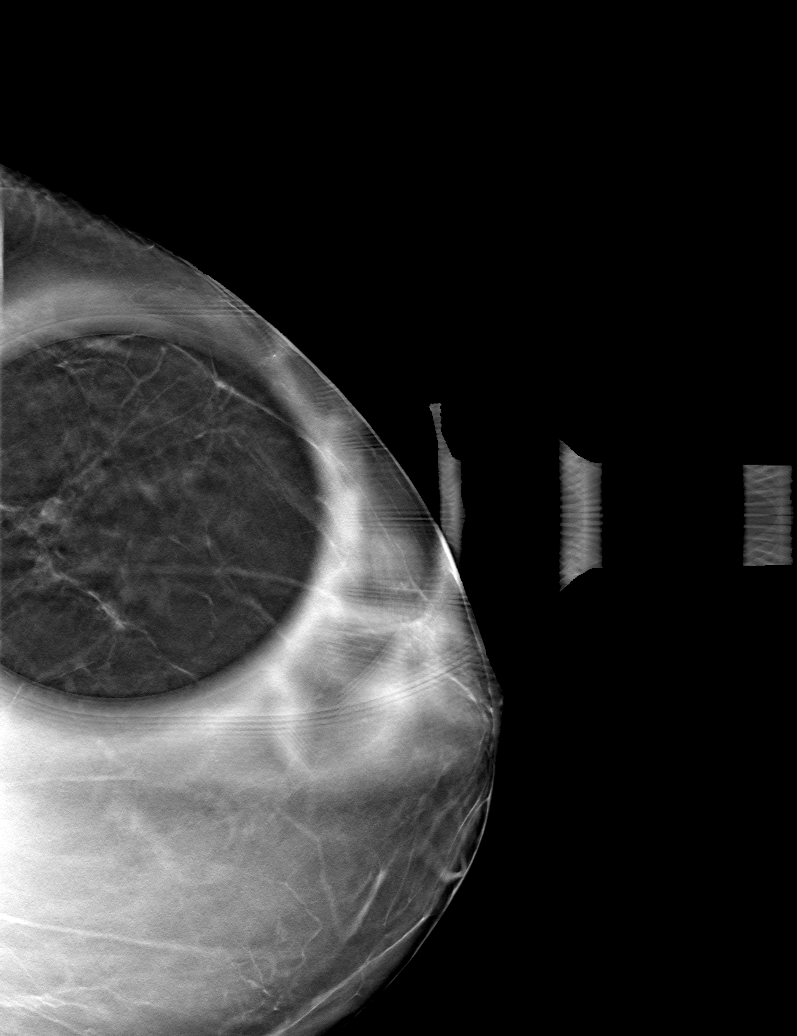

[6 of 18 positions shown; findings below may reference images not displayed]

ACR Breast Density Category b: There are scattered areas of
fibroglandular density.
FINDINGS: Additional views including spot compression demonstrate an obscured
4 mm mass in the upper-outer left breast.

Targeted left breast ultrasound was performed.

At 2 o'clock 8 cm from the nipple an indistinct hypoechoic mass
measures 3 x 4 x 6 mm. This corresponds to the mass seen on the
mammogram.

No abnormally enlarged left axillary lymph node is identified.
IMPRESSION: Indistinct hypoechoic mass in the left breast is suspicious for
malignancy.

RECOMMENDATION:
Recommend ultrasound-guided left breast biopsy.

I have discussed the findings and recommendations with the patient.
If applicable, a reminder letter will be sent to the patient
regarding the next appointment.

BI-RADS CATEGORY  4: Suspicious.

## 2024-03-03 IMAGING — US US BREAST*L* LIMITED INC AXILLA
1 series · 9 of 9 positions shown · non-contrast
Comparison: Previous exam(s).

CLINICAL DATA: Recall from screening mammogram for an asymmetry in
the left breast.

EXAM:
DIGITAL DIAGNOSTIC UNILATERAL LEFT MAMMOGRAM WITH TOMOSYNTHESIS AND
CAD; ULTRASOUND LEFT BREAST LIMITED
TECHNIQUE: Left digital diagnostic mammography and breast tomosynthesis was
performed. The images were evaluated with computer-aided detection.;
Targeted ultrasound examination of the left breast was performed.

[Series 1: us breast*left* limited inc axilla · 0.06mm/px · 9 of 9 slices shown]
[im 1/9]
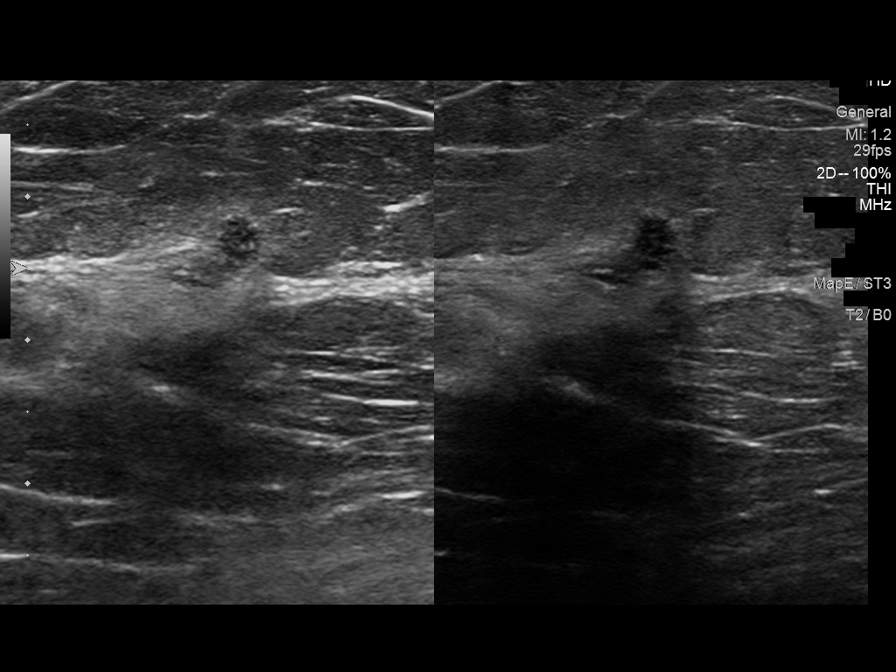
[im 2/9]
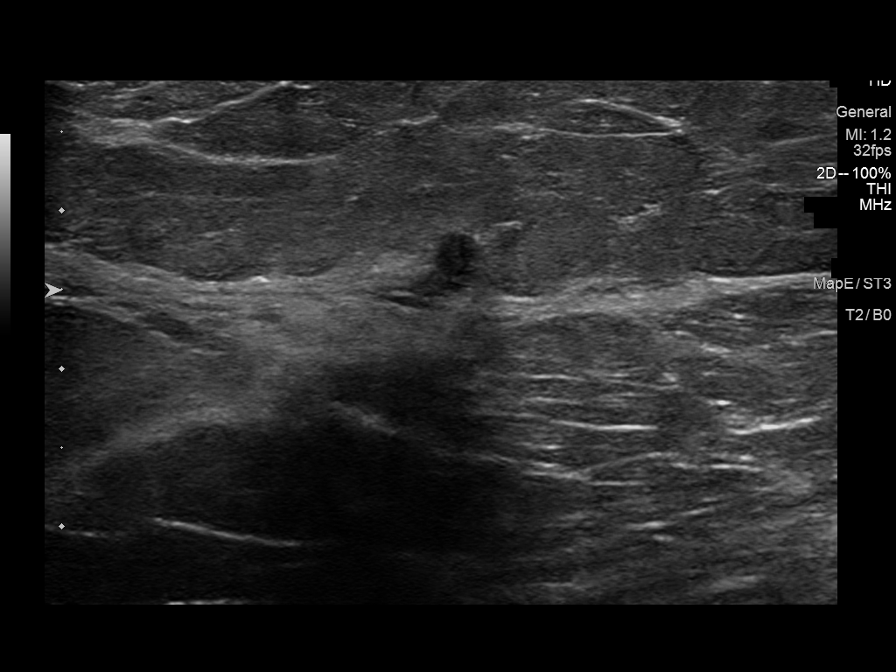
[im 3/9]
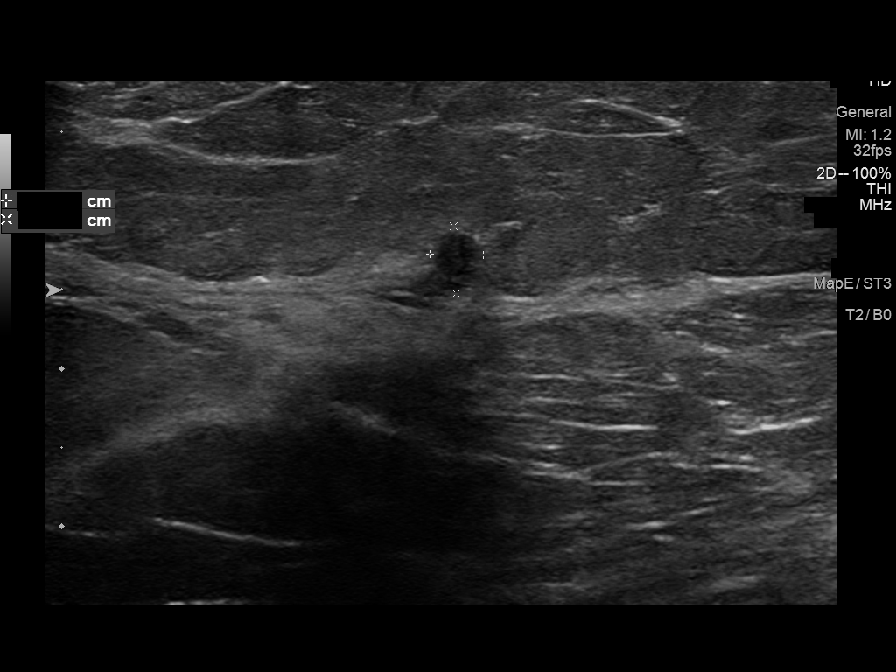
[im 4/9]
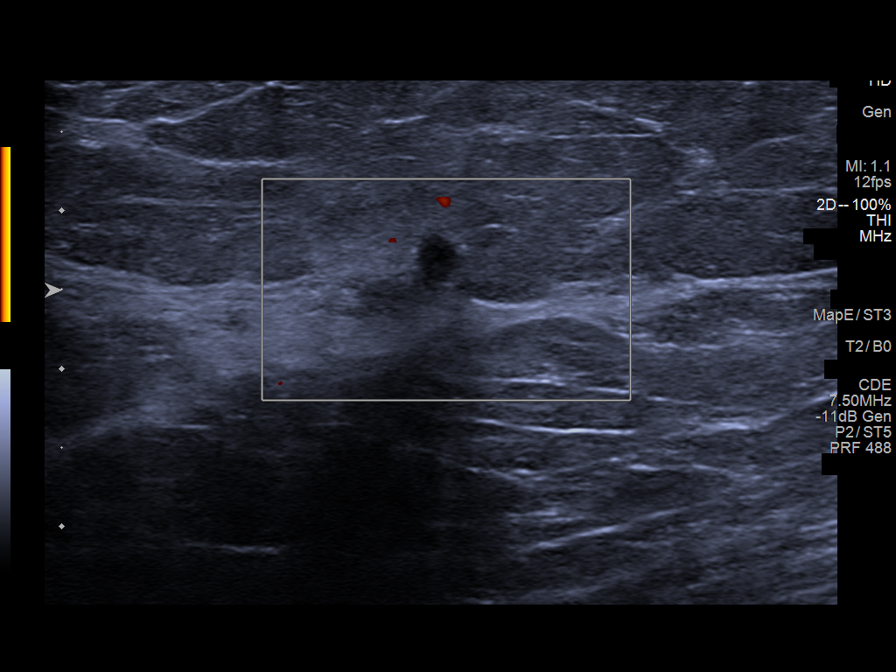
[im 5/9]
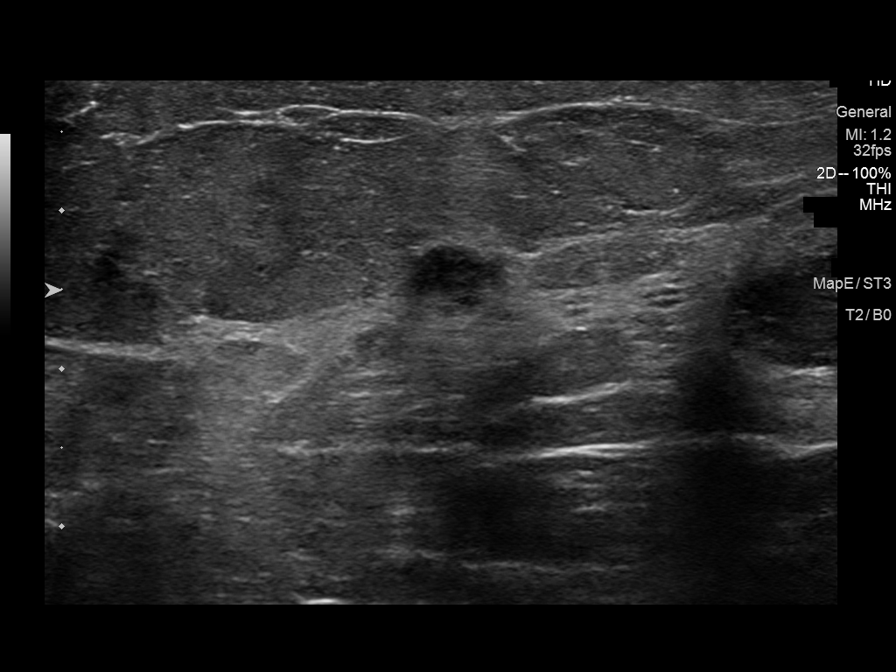
[im 6/9]
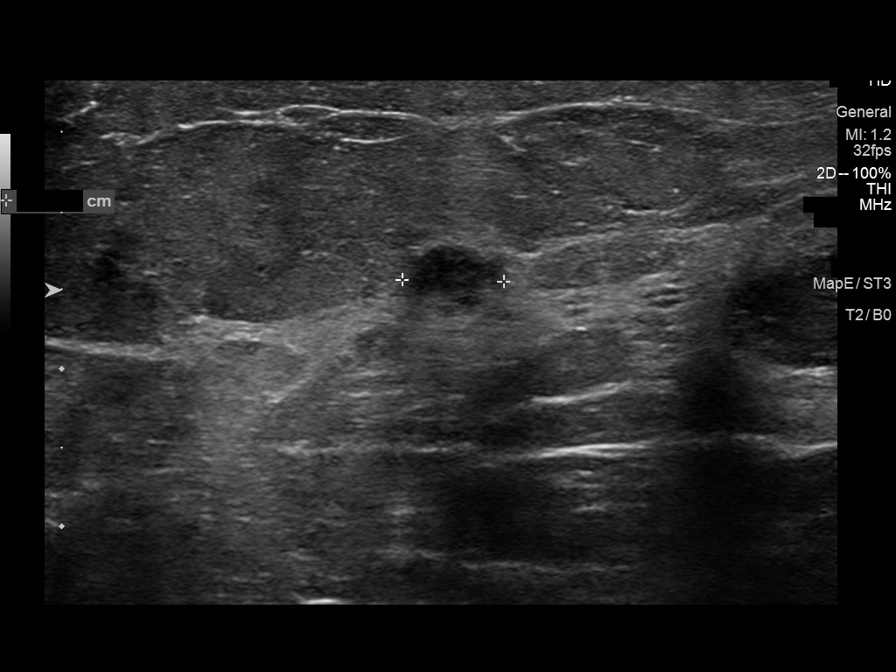
[im 7/9]
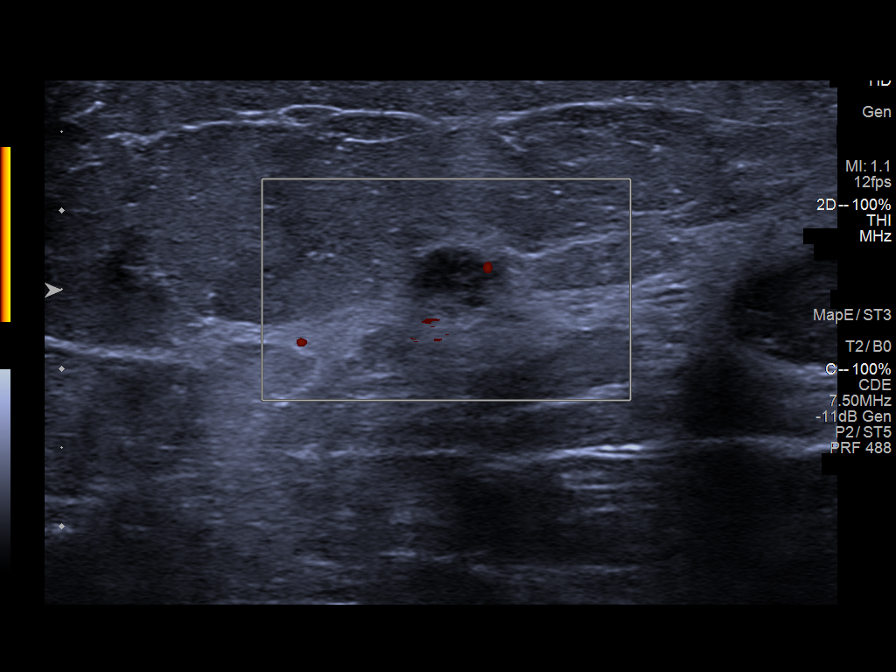
[im 8/9]
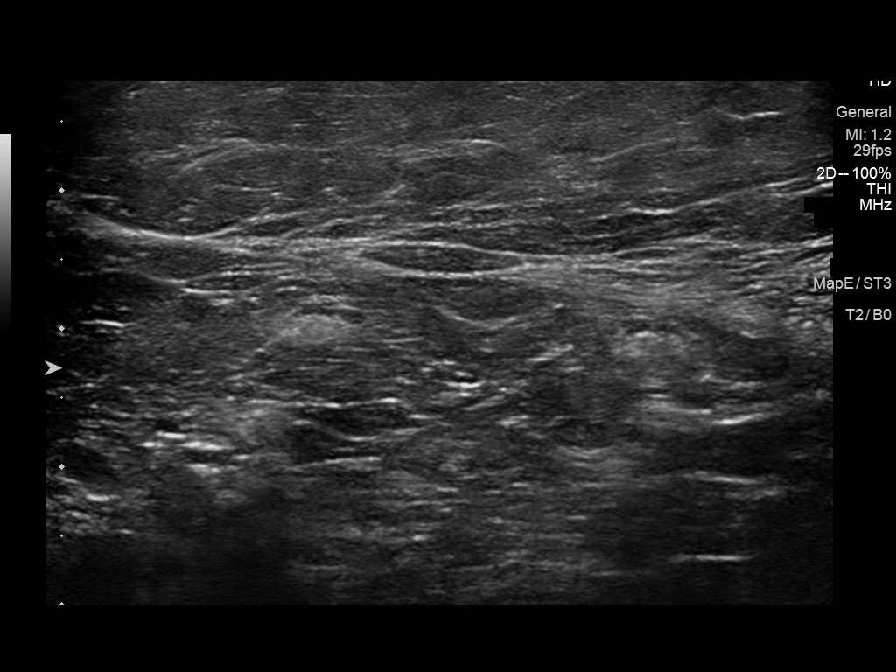
[im 9/9]
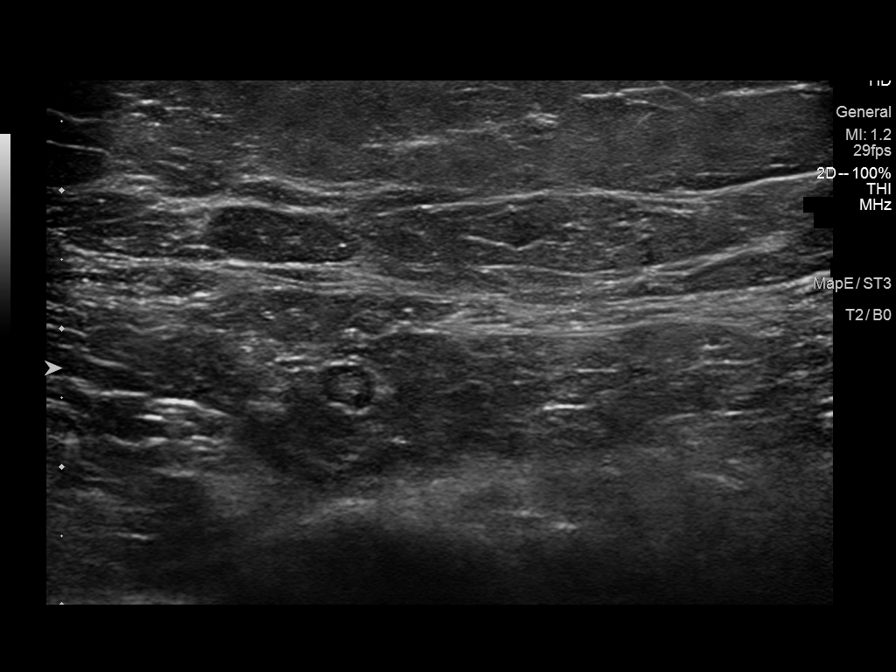

[9 of 9 positions shown; findings below may reference images not displayed]

ACR Breast Density Category b: There are scattered areas of
fibroglandular density.
FINDINGS: Additional views including spot compression demonstrate an obscured
4 mm mass in the upper-outer left breast.

Targeted left breast ultrasound was performed.

At 2 o'clock 8 cm from the nipple an indistinct hypoechoic mass
measures 3 x 4 x 6 mm. This corresponds to the mass seen on the
mammogram.

No abnormally enlarged left axillary lymph node is identified.
IMPRESSION: Indistinct hypoechoic mass in the left breast is suspicious for
malignancy.

RECOMMENDATION:
Recommend ultrasound-guided left breast biopsy.

I have discussed the findings and recommendations with the patient.
If applicable, a reminder letter will be sent to the patient
regarding the next appointment.

BI-RADS CATEGORY  4: Suspicious.

## 2024-03-08 ENCOUNTER — Other Ambulatory Visit: Payer: Self-pay

## 2024-03-08 DIAGNOSIS — N898 Other specified noninflammatory disorders of vagina: Secondary | ICD-10-CM

## 2024-03-08 MED ORDER — ESTRADIOL 0.01 % VA CREA: 0.2500 | TOPICAL_CREAM | Freq: Every day | VAGINAL | 3 refills | Status: AC

## 2024-03-08 NOTE — Progress Notes (Signed)
 RF for estradiol .  Seen 12/22/23

## 2024-03-10 ENCOUNTER — Ambulatory Visit: Admitting: Oncology

## 2024-03-18 ENCOUNTER — Encounter: Payer: Self-pay | Admitting: Family Medicine

## 2024-03-19 NOTE — Telephone Encounter (Signed)
Please advise on status of this referral

## 2024-03-24 DIAGNOSIS — H18422 Band keratopathy, left eye: Secondary | ICD-10-CM | POA: Diagnosis not present

## 2024-03-24 DIAGNOSIS — B028 Zoster with other complications: Secondary | ICD-10-CM | POA: Diagnosis not present

## 2024-04-04 NOTE — Progress Notes (Unsigned)
 Megan Harrison - 68 y.o. female MRN [id number]  Date of birth: 1955/10/02  Office Visit Note: Visit Date: 04/05/2024 PCP: Randeen Laine LABOR, MD Referred by: Tower, Laine LABOR, MD  Subjective: No chief complaint on file.  HPI: Megan Harrison is a pleasant 68 y.o. female who presents today for right thumb basilar joint pain has been present now for multiple months.  She does have a history of an injury to the right hand and thumb in October of this year which she thinks exacerbated her symptoms.  Has current pain with loading of the right thumb, pain with heavy grip and rotational activities.  Denies any significant numbness or tingling.  Has not undergone any prior treatment.  Does have previous x-rays for fracture, there is evidence of right thumb CMC arthritic changes.  Pertinent ROS were reviewed with the patient and found to be negative unless otherwise specified above in HPI.   Visit Reason: right hand/ thumb Duration of symptoms: 02/08/24 Hand dominance: right Occupation: retired Diabetic: No Smoking: No Heart/Lung History: none Blood Thinners:  aspirin  Prior Testing/EMG: xray 02/08/24, 03/01/24 Injections (Date): none Treatments: none Prior Surgery: none    Assessment & Plan: Visit Diagnoses:  1. Arthritis of carpometacarpal (CMC) joint of right thumb     Plan: Extensive discussion was had with the patient today regarding her right thumb thumb basilar joint pain.  X-rays done previously confirm diagnosis of ongoing right thumb CMC arthritis which correlates with clinical examination.  We reviewed the etiology and pathophysiology of this condition as well as appropriate treatment modalities ranging from conservative to surgical.  From a conservative standpoint, we discussed bracing, activity modification, nonsteroidal anti-inflammatory medications both oral and topical, and finally cortisone injections.  From surgical standpoint, we discussed the possibility for right thumb CMC  arthroplasty in the future should symptoms refractory to conservative care.  I discussed the surgical treatment as well as the postop protocol in detail with patient as well today for understanding.  At this juncture, given that patient has not undergone any formalized treatment, we will have right fitted for a Comfort Cool brace.  I will have her return to me in approximately 6 weeks time to track her progress, it mention at that point we could perform cortisone injection to the right thumb CMC interval for symptom relief.  Injection was offered today as well, however she would like to trial the brace first and see how much relief she can get from this before proceeding with injections which is appropriate.   Follow-up: No follow-ups on file.   Meds & Orders: No orders of the defined types were placed in this encounter.  No orders of the defined types were placed in this encounter.    Procedures: No procedures performed      Clinical History: No specialty comments available.  She reports that she has never smoked. She has never used smokeless tobacco. No results for input(s): HGBA1C, LABURIC in the last 8760 hours.  Objective:   Vital Signs: There were no vitals taken for this visit.  Physical Exam  Gen: Well-appearing, in no acute distress; non-toxic CV: Regular Rate. Well-perfused. Warm.  Resp: Breathing unlabored on room air; no wheezing. Psych: Fluid speech in conversation; appropriate affect; normal thought process  Ortho Exam General: Patient is well appearing and in no distress.    Skin and Muscle: No skin changes are apparent to upper extremities.  Muscle bulk and contour normal, no signs of atrophy.  Range of Motion and Palpation Tests: Mobility is full about the elbows with flexion and extension.  Forearm supination and pronation are 85/85 bilaterally.  Wrist flexion/extension is 75/65 bilaterally.  Digital flexion and extension are full.  Thumb opposition is  full to the base of the small fingers bilaterally.     No cords or nodules are palpated.  No triggering is observed.     Significant tenderness over the right thumb CMC articulation is observed, positive grind for pain, positive crepitus.  MP hyperextension mild.    Finklestein test mildly positive right   Neurologic, Vascular, Motor: Sensation is intact to light touch in the median/radial/ulnar distributions.  Fingers pink and well perfused.  Capillary refill is brisk.      Imaging: No results found.  Past Medical/Family/Surgical/Social History: Medications & Allergies reviewed per EMR, new medications updated. Patient Active Problem List   Diagnosis Date Noted   Hand pain 03/01/2024   Hand injury 03/01/2024   Colon cancer screening 10/24/2023   Benign paroxysmal positional vertigo 10/13/2023   Essential hypertension 04/27/2023   History of breast cancer 04/25/2023   History of vaginitis 10/31/2022   Neuroma of foot 04/11/2022   History of DVT (deep vein thrombosis) 01/15/2022   Goals of care, counseling/discussion 11/30/2021   Malignant neoplasm of upper-outer quadrant of left breast in female, estrogen receptor negative (HCC) 11/04/2021   Osteopenia 01/31/2019   Estrogen deficiency 01/18/2019   Family history of colon cancer - brother in early 60's 10/21/2018   Encounter for screening mammogram for breast cancer 05/19/2017   Hyperlipidemia 12/10/2016   Encounter for routine gynecological examination 11/04/2014   Hemorrhoids, internal 04/02/2013   Routine general medical examination at a health care facility 06/09/2011   ALLERGIC RHINITIS 03/10/2009   History of shingles 08/28/2006   Past Medical History:  Diagnosis Date   Allergy 1990   Anemia    Arthritis    Cutaneous sarcoidosis (HCC)    Degenerative disc disease    Diverticulosis    Dysplastic nevus 11/19/2017   L mid back 6.0 cm lat to spine   Foot pain    GERD (gastroesophageal reflux disease)    Glaucoma  2005   Hyperlipidemia    Malignant neoplasm of upper-outer quadrant of left breast in female, estrogen receptor negative (HCC) 11/04/2021   Migraine    Personal history of chemotherapy    Personal history of radiation therapy    Shingles    eyes   Family History  Problem Relation Age of Onset   Heart disease Mother        CAD   Alcohol abuse Mother    Stroke Mother    Lung cancer Father    Cancer Father    Colon polyps Sister    Colon cancer Brother        early 23's   Stomach cancer Brother    Cancer Brother    Esophageal cancer Brother    Heart disease Brother        CAD   Throat cancer Brother    Cancer Brother    Heart disease Brother        CAD, pre diabetic, obese   Cancer Brother    Drug abuse Brother    Breast cancer Neg Hx    Rectal cancer Neg Hx    Past Surgical History:  Procedure Laterality Date   2D Echo  1999   Negative   ABDOMINAL HYSTERECTOMY  11/99   APPENDECTOMY  02-18-16  Arm lesion, cutaneous sarcoid  12/2005   with normal CXR and ANA   BREAST BIOPSY Left 10/23/2021   us  biopsy/ heart clip/ [facility]   BREAST LUMPECTOMY Left 2023   BREAST LUMPECTOMY WITH RADIOFREQUENCY TAG IDENTIFICATION Left 11/13/2021   lt br rf tag placement   BREAST LUMPECTOMY,RADIO FREQ LOCALIZER,AXILLARY SENTINEL LYMPH NODE BIOPSY Left 11/20/2021   Procedure: BREAST LUMPECTOMY,RADIO FREQ LOCALIZER,AXILLARY SENTINEL LYMPH NODE BIOPSY;  Surgeon: Desiderio Schanz, MD;  Location: [facility];  Service: General;  Laterality: Left;   BREAST SURGERY  November 18, 2021   Lumpectomy- left breast   CESAREAN SECTION     x2   COLONOSCOPY  11/0/2009,08/18/13   diverticulosis and hemorrhoids   DIAGNOSTIC LAPAROSCOPY     EYE SURGERY  10/13/2018   had shingles in left eye, had to have surgery, has contact lens in now.   FOOT NEUROMA SURGERY Left    LAPAROSCOPIC APPENDECTOMY N/A 02/18/2016   Procedure: APPENDECTOMY LAPAROSCOPIC;  Surgeon: Lynda Leos, MD;  Location: [facility];  Service: General;   Laterality: N/A;   LUMBAR LAMINECTOMY  10/2017   PERIPHERAL VASCULAR THROMBECTOMY Right 01/16/2022   Procedure: PERIPHERAL VASCULAR THROMBECTOMY;  Surgeon: Marea Selinda RAMAN, MD;  Location: [facility];  Service: Cardiovascular;  Laterality: Right;   PORTA CATH REMOVAL N/A 02/28/2022   Procedure: PORTA CATH REMOVAL;  Surgeon: Marea Selinda RAMAN, MD;  Location: [facility];  Service: Cardiovascular;  Laterality: N/A;   PORTACATH PLACEMENT Right 12/05/2021   Procedure: INSERTION PORT-A-CATH;  Surgeon: Desiderio Schanz, MD;  Location: [facility];  Service: General;  Laterality: Right;   ROTATOR CUFF REPAIR Left    SPINE SURGERY  10/16/2017   TONSILLECTOMY     UPPER GASTROINTESTINAL ENDOSCOPY  11/15/2010   non-erosive gastritis, H. pylori negative   Social History   Occupational History   Occupation: Works for [employer]    Employer: RETIRED  Tobacco Use   Smoking status: Never   Smokeless tobacco: Never  Vaping Use   Vaping status: Never Used  Substance and Sexual Activity   Alcohol use: No    Alcohol/week: 0.0 standard drinks of alcohol   Drug use: No   Sexual activity: Yes    Yehya Brendle Afton Alderton, M.D. [facility], Hand Surgery

## 2024-04-05 ENCOUNTER — Ambulatory Visit: Admitting: Orthopedic Surgery

## 2024-04-05 DIAGNOSIS — M1811 Unilateral primary osteoarthritis of first carpometacarpal joint, right hand: Secondary | ICD-10-CM | POA: Diagnosis not present

## 2024-04-13 ENCOUNTER — Ambulatory Visit: Payer: Medicare PPO

## 2024-04-13 VITALS — Ht 62.5 in | Wt 165.0 lb

## 2024-04-13 DIAGNOSIS — Z0000 Encounter for general adult medical examination without abnormal findings: Secondary | ICD-10-CM | POA: Diagnosis not present

## 2024-04-13 NOTE — Patient Instructions (Signed)
 Megan Harrison,  Thank you for taking the time for your Medicare Wellness Visit. I appreciate your continued commitment to your health goals. Please review the care plan we discussed, and feel free to reach out if I can assist you further.  Please note that Annual Wellness Visits do not include a physical exam. Some assessments may be limited, especially if the visit was conducted virtually. If needed, we may recommend an in-person follow-up with your provider.  Ongoing Care Seeing your primary care provider every 3 to 6 months helps us  monitor your health and provide consistent, personalized care.   Referrals If a referral was made during today's visit and you haven't received any updates within two weeks, please contact the referred provider directly to check on the status.  Recommended Screenings:  Health Maintenance  Topic Date Due   Hepatitis C Screening  Never done   Osteoporosis screening with Bone Density Scan  05/31/2023   Medicare Annual Wellness Visit  04/24/2024   COVID-19 Vaccine (5 - 2025-26 season) 03/17/2026*   Breast Cancer Screening  10/08/2024   DTaP/Tdap/Td vaccine (3 - Td or Tdap) 11/03/2024   Colon Cancer Screening  12/31/2028   Pneumococcal Vaccine for age over 25  Completed   Flu Shot  Completed   Zoster (Shingles) Vaccine  Completed   Meningitis B Vaccine  Aged Out  *Topic was postponed. The date shown is not the original due date.       04/09/2024    9:03 AM  Advanced Directives  Does Patient Have a Medical Advance Directive? Yes  Type of Estate Agent of [place];Living will  Copy of Healthcare Power of Attorney in Chart? No - copy requested    Vision: Annual vision screenings are recommended for early detection of glaucoma, cataracts, and diabetic retinopathy. These exams can also reveal signs of chronic conditions such as diabetes and high blood pressure.  Dental: Annual dental screenings help detect early signs of oral cancer,  gum disease, and other conditions linked to overall health, including heart disease and diabetes.

## 2024-04-13 NOTE — Progress Notes (Signed)
 Chief Complaint  Patient presents with   Medicare Wellness     Subjective:   Megan Harrison is a 68 y.o. female who presents for a Medicare Annual Wellness Visit.  Visit info / Clinical Intake: Medicare Wellness Visit Type:: Subsequent Annual Wellness Visit Persons participating in visit and providing information:: patient Medicare Wellness Visit Mode:: Telephone If telephone:: video declined Since this visit was completed virtually, some vitals may be partially provided or unavailable. Missing vitals are due to the limitations of the virtual format.: Unable to obtain vitals - no equipment If Telephone or Video please confirm:: I connected with patient using audio/video enable telemedicine. I verified patient identity with two identifiers, discussed telehealth limitations, and patient agreed to proceed. Patient Location:: home Provider Location:: clinic Interpreter Needed?: No Pre-visit prep was completed: yes AWV questionnaire completed by patient prior to visit?: yes Date:: 04/09/24 Living arrangements:: (Patient-Rptd) lives with spouse/significant other; with family/others Patient's Overall Health Status Rating: (Patient-Rptd) good Typical amount of pain: (Patient-Rptd) some Does pain affect daily life?: (!) (Patient-Rptd) yes Are you currently prescribed opioids?: no  Dietary Habits and Nutritional Risks How many meals a day?: (Patient-Rptd) 3 Eats fruit and vegetables daily?: (Patient-Rptd) yes Most meals are obtained by: (Patient-Rptd) preparing own meals In the last 2 weeks, have you had any of the following?: none Diabetic:: no  Functional Status Activities of Daily Living (to include ambulation/medication): (Patient-Rptd) Independent Ambulation: (Patient-Rptd) Independent Medication Administration: (Patient-Rptd) Independent Home Management (perform basic housework or laundry): (Patient-Rptd) Independent Manage your own finances?: (Patient-Rptd) yes Primary  transportation is: (Patient-Rptd) driving Concerns about vision?: no *vision screening is required for WTM* Concerns about hearing?: no  Fall Screening Falls in the past year?: (Patient-Rptd) 0 Number of falls in past year: 0 Was there an injury with Fall?: 0 Fall Risk Category Calculator: 0 Patient Fall Risk Level: Low Fall Risk  Fall Risk Patient at Risk for Falls Due to: No Fall Risks Fall risk Follow up: Education provided; Falls evaluation completed; Falls prevention discussed  Home and Transportation Safety: All rugs have non-skid backing?: (Patient-Rptd) yes All stairs or steps have railings?: (Patient-Rptd) yes Grab bars in the bathtub or shower?: (Patient-Rptd) yes Have non-skid surface in bathtub or shower?: (Patient-Rptd) yes Good home lighting?: (Patient-Rptd) yes Regular seat belt use?: (Patient-Rptd) yes Hospital stays in the last year:: (Patient-Rptd) no  Cognitive Assessment Difficulty concentrating, remembering, or making decisions? : (Patient-Rptd) no Will 6CIT or Mini Cog be Completed: yes What year is it?: 0 points What month is it?: 0 points Give patient an address phrase to remember (5 components): [address] [place]  About what time is it?: 0 points Count backwards from 20 to 1: 0 points Say the months of the year in reverse: 0 points Repeat the address phrase from earlier: 0 points 6 CIT Score: 0 points  Advance Directives (For Healthcare) Does Patient Have a Medical Advance Directive?: Yes Does patient want to make changes to medical advance directive?: No - Patient declined Type of Advance Directive: Healthcare Power of [place]; Living will Copy of Healthcare Power of Attorney in Chart?: No - copy requested Copy of Living Will in Chart?: No - copy requested Living Will Requested and Now in Chart: Copy in chart Would patient like information on creating a medical advance directive?: No - Patient declined  Reviewed/Updated   Reviewed/Updated: Reviewed All (Medical, Surgical, Family, Medications, Allergies, Care Teams, Patient Goals)    Allergies (verified) Sumatriptan and Codeine   Current Medications (verified) Outpatient  Encounter Medications as of 04/13/2024  Medication Sig   acetaminophen  (TYLENOL ) 500 MG tablet Take 2 tablets (1,000 mg total) by mouth every 6 (six) hours as needed for mild pain.   aspirin EC 81 MG tablet Take 81 mg by mouth daily. Swallow whole.   atorvastatin  (LIPITOR) 10 MG tablet Take 1 tablet (10 mg total) by mouth daily.   calcium  carbonate (TUMS - DOSED IN MG ELEMENTAL CALCIUM ) 500 MG chewable tablet Chew 1 tablet by mouth as needed for indigestion or heartburn.   Cholecalciferol (VITAMIN D3) 50 MCG (2000 UT) TABS Take 1 tablet by mouth daily.   clobetasol  ointment (TEMOVATE ) 0.05 % Apply to vulva every other night indefinitely   estradiol  (ESTRACE ) 0.01 % CREA vaginal cream Place 0.25 Applicatorfuls vaginally at bedtime.   estradiol  (ESTRACE ) 0.1 MG/GM vaginal cream PLACE 1 APPLICATION VAGINALLY AT BEDTIME FOR 14 DAYS THEN REDUCE TO TWICE WEEKLY   ferrous sulfate  325 (65 FE) MG tablet Take 325 mg by mouth daily.   fluticasone  (FLONASE ) 50 MCG/ACT nasal spray USE 1 SPRAY(S) IN EACH NOSTRIL ONCE DAILY AS DIRECTED   hydrochlorothiazide  (MICROZIDE ) 12.5 MG capsule Take 1 capsule (12.5 mg total) by mouth daily.   Magnesium Hydroxide (DULCOLAX PO) Take 1 tablet by mouth 2 (two) times daily.   meloxicam  (MOBIC ) 15 MG tablet TAKE 1 TABLET BY MOUTH ONCE DAILY AS NEEDED WITH FOOD FOR PAIN   Multiple Vitamins-Minerals (ONE-A-DAY WOMENS 50 PLUS PO) Take 1 tablet by mouth daily.   prednisoLONE acetate (PRED FORTE) 1 % ophthalmic suspension Place 1 drop into the left eye every other day.   Timolol Maleate 0.5 % (DAILY) SOLN Place 1 drop into the left eye daily at 6 (six) AM.   estradiol  (ESTRACE  VAGINAL) 0.1 MG/GM vaginal cream Place 1 Applicatorful vaginally at bedtime for 14 days. Then reduce  twice weekly   Facility-Administered Encounter Medications as of 04/13/2024  Medication   heparin  lock flush 100 UNIT/ML injection   palonosetron  (ALOXI ) 0.25 MG/5ML injection    History: Past Medical History:  Diagnosis Date   Allergy 1990   Anemia    Arthritis    Cutaneous sarcoidosis (HCC)    Degenerative disc disease    Diverticulosis    Dysplastic nevus 11/19/2017   L mid back 6.0 cm lat to spine   Foot pain    GERD (gastroesophageal reflux disease)    Glaucoma 2005   Hyperlipidemia    Malignant neoplasm of upper-outer quadrant of left breast in female, estrogen receptor negative (HCC) 11/04/2021   Migraine    Personal history of chemotherapy    Personal history of radiation therapy    Shingles    eyes   Past Surgical History:  Procedure Laterality Date   2D Echo  1999   Negative   ABDOMINAL HYSTERECTOMY  11/99   APPENDECTOMY  02-18-16   Arm lesion, cutaneous sarcoid  12/2005   with normal CXR and ANA   BREAST BIOPSY Left 10/23/2021   us  biopsy/ heart clip/ [facility]   BREAST LUMPECTOMY Left 2023   BREAST LUMPECTOMY WITH RADIOFREQUENCY TAG IDENTIFICATION Left 11/13/2021   lt br rf tag placement   BREAST LUMPECTOMY,RADIO FREQ LOCALIZER,AXILLARY SENTINEL LYMPH NODE BIOPSY Left 11/20/2021   Procedure: BREAST LUMPECTOMY,RADIO FREQ LOCALIZER,AXILLARY SENTINEL LYMPH NODE BIOPSY;  Surgeon: Desiderio Schanz, MD;  Location: [facility] OR[facility];  Service: General;  Laterality: Left;   BREAST SURGERY  November 18, 2021   Lumpectomy- left breast   CESAREAN SECTION     x2  COLONOSCOPY  11/0/2009,08/18/13   diverticulosis and hemorrhoids   DIAGNOSTIC LAPAROSCOPY     EYE SURGERY  10/13/2018   had shingles in left eye, had to have surgery, has contact lens in now.   FOOT NEUROMA SURGERY Left    LAPAROSCOPIC APPENDECTOMY N/A 02/18/2016   Procedure: APPENDECTOMY LAPAROSCOPIC;  Surgeon: Lynda Leos, MD;  Location: [facility];  Service: General;  Laterality: N/A;   LUMBAR LAMINECTOMY  10/2017    PERIPHERAL VASCULAR THROMBECTOMY Right 01/16/2022   Procedure: PERIPHERAL VASCULAR THROMBECTOMY;  Surgeon: Marea Selinda RAMAN, MD;  Location: [facility];  Service: Cardiovascular;  Laterality: Right;   PORTA CATH REMOVAL N/A 02/28/2022   Procedure: PORTA CATH REMOVAL;  Surgeon: Marea Selinda RAMAN, MD;  Location: [facility];  Service: Cardiovascular;  Laterality: N/A;   PORTACATH PLACEMENT Right 12/05/2021   Procedure: INSERTION PORT-A-CATH;  Surgeon: Desiderio Schanz, MD;  Location: [facility];  Service: General;  Laterality: Right;   ROTATOR CUFF REPAIR Left    SPINE SURGERY  10/16/2017   TONSILLECTOMY     UPPER GASTROINTESTINAL ENDOSCOPY  11/15/2010   non-erosive gastritis, H. pylori negative   Family History  Problem Relation Age of Onset   Heart disease Mother        CAD   Alcohol abuse Mother    Stroke Mother    Lung cancer Father    Cancer Father    Colon polyps Sister    Colon cancer Brother        early 63's   Stomach cancer Brother    Cancer Brother    Esophageal cancer Brother    Heart disease Brother        CAD   Throat cancer Brother    Cancer Brother    Heart disease Brother        CAD, pre diabetic, obese   Cancer Brother    Drug abuse Brother    Breast cancer Neg Hx    Rectal cancer Neg Hx    Social History   Occupational History   Occupation: Works for [employer]    Employer: RETIRED  Tobacco Use   Smoking status: Never   Smokeless tobacco: Never  Vaping Use   Vaping status: Never Used  Substance and Sexual Activity   Alcohol use: No    Alcohol/week: 0.0 standard drinks of alcohol   Drug use: No   Sexual activity: Yes   Tobacco Counseling Counseling given: Not Answered  SDOH Screenings   Food Insecurity: No Food Insecurity (04/13/2024)  Housing: Low Risk  (04/13/2024)  Transportation Needs: No Transportation Needs (04/13/2024)  Utilities: Not At Risk (04/13/2024)  Alcohol Screen: Low Risk  (02/01/2020)  Depression (PHQ2-9): Low Risk   (04/13/2024)  Financial Resource Strain: Low Risk  (02/29/2024)  Physical Activity: Sufficiently Active (04/13/2024)  Social Connections: Socially Integrated (04/13/2024)  Stress: No Stress Concern Present (04/13/2024)  Tobacco Use: Low Risk  (04/13/2024)  Health Literacy: Adequate Health Literacy (04/13/2024)   See flowsheets for full screening details  Depression Screen PHQ 2 & 9 Depression Scale- Over the past 2 weeks, how often have you been bothered by any of the following problems? Little interest or pleasure in doing things: 0 Feeling down, depressed, or hopeless (PHQ Adolescent also includes...irritable): 0 PHQ-2 Total Score: 0 Trouble falling or staying asleep, or sleeping too much: 1 Feeling tired or having little energy: 0 Poor appetite or overeating (PHQ Adolescent also includes...weight loss): 0 Feeling bad about yourself - or that you are  a failure or have let yourself or your family down: 0 Trouble concentrating on things, such as reading the newspaper or watching television (PHQ Adolescent also includes...like school work): 0 Moving or speaking so slowly that other people could have noticed. Or the opposite - being so fidgety or restless that you have been moving around a lot more than usual: 0 Thoughts that you would be better off dead, or of hurting yourself in some way: 0 PHQ-9 Total Score: 1 If you checked off any problems, how difficult have these problems made it for you to do your work, take care of things at home, or get along with other people?: Not difficult at all     Goals Addressed             This Visit's Progress    Maintain healthy lifestyle   On track    Stay active Healthy diet Good water  intake             Objective:    Today's Vitals   04/13/24 1304  Weight: 165 lb (74.8 kg)  Height: 5' 2.5 (1.588 m)   Body mass index is 29.7 kg/m.  Hearing/Vision screen Vision Screening - Comments:: UTD with visits Dr Dingledein Immunizations and  Health Maintenance Health Maintenance  Topic Date Due   Hepatitis C Screening  Never done   Bone Density Scan  05/31/2023   Medicare Annual Wellness (AWV)  04/24/2024   COVID-19 Vaccine (5 - 2025-26 season) 03/17/2026 (Originally 01/05/2024)   Mammogram  10/08/2024   DTaP/Tdap/Td (3 - Td or Tdap) 11/03/2024   Colonoscopy  12/31/2028   Pneumococcal Vaccine: 50+ Years  Completed   Influenza Vaccine  Completed   Zoster Vaccines- Shingrix  Completed   Meningococcal B Vaccine  Aged Out        Assessment/Plan:  This is a routine wellness examination for Brady.  Patient Care Team: Tower, Laine LABOR, MD as PCP - General Georgina Shasta POUR, RN as Oncology Nurse Navigator Melanee Annah BROCKS, MD as Consulting Physician (Oncology) Lenn Aran, MD as Consulting Physician (Radiation Oncology) Marea Selinda RAMAN, MD as Referring Physician (Vascular Surgery) Desiderio Schanz, MD as Consulting Physician (General Surgery) Dingeldein, Elspeth, MD (Ophthalmology)  I have personally reviewed and noted the following in the patient's chart:   Medical and social history Use of alcohol, tobacco or illicit drugs  Current medications and supplements including opioid prescriptions. Functional ability and status Nutritional status Physical activity Advanced directives List of other physicians Hospitalizations, surgeries, and ER visits in previous 12 months Vitals Screenings to include cognitive, depression, and falls Referrals and appointments  No orders of the defined types were placed in this encounter.  In addition, I have reviewed and discussed with patient certain preventive protocols, quality metrics, and best practice recommendations. A written personalized care plan for preventive services as well as general preventive health recommendations were provided to patient.   Erminio LITTIE Saris, LPN   [id number]   Return in about 1 year (around 04/13/2025) for AWV.  After Visit Summary: (MyChart) Due to this being  a telephonic visit, the after visit summary with patients personalized plan was offered to patient via MyChart   Nurse Notes: Pt has no concerns or questions. CPE w/PC due:pt says she will make this when she gets her calendar. AWV made for next year

## 2024-04-14 ENCOUNTER — Other Ambulatory Visit: Payer: Self-pay

## 2024-05-07 ENCOUNTER — Other Ambulatory Visit: Payer: Self-pay | Admitting: Family Medicine

## 2024-05-07 NOTE — Telephone Encounter (Signed)
 Last filled on 11/14/23 #90 tab/ 1 refill  CPE 05/14/24

## 2024-05-10 ENCOUNTER — Encounter: Payer: Self-pay | Admitting: Family Medicine

## 2024-05-12 ENCOUNTER — Encounter: Admitting: Family Medicine

## 2024-05-14 ENCOUNTER — Encounter: Admitting: Family Medicine

## 2024-05-16 NOTE — Progress Notes (Unsigned)
 Right thumb/ wrist pain Brace follow up; no injection given

## 2024-05-17 ENCOUNTER — Ambulatory Visit: Admitting: Orthopedic Surgery

## 2024-05-17 DIAGNOSIS — M1811 Unilateral primary osteoarthritis of first carpometacarpal joint, right hand: Secondary | ICD-10-CM | POA: Diagnosis not present

## 2024-05-17 MED ORDER — LIDOCAINE HCL 1 % IJ SOLN
1.0000 mL | INTRAMUSCULAR | Status: AC | PRN
  Administered 2024-05-17: 1 mL

## 2024-05-17 MED ORDER — BETAMETHASONE SOD PHOS & ACET 6 (3-3) MG/ML IJ SUSP
6.0000 mg | INTRAMUSCULAR | Status: AC | PRN
  Administered 2024-05-17: 6 mg via INTRA_ARTICULAR

## 2024-06-08 ENCOUNTER — Ambulatory Visit: Admitting: Oncology

## 2024-06-08 ENCOUNTER — Other Ambulatory Visit: Payer: Self-pay | Admitting: Family Medicine

## 2024-06-09 NOTE — Telephone Encounter (Signed)
 Pt cancelled her last 2 CPE appt scheduled last month. Please r/s CPE, labs prior and route back to me. FYI pt didn't want to come in during flu season so it's okay to schedule it in March/ April just need something on the books before I can refill med.

## 2024-06-09 NOTE — Telephone Encounter (Signed)
"  Lvm to call the office     "

## 2024-06-10 NOTE — Telephone Encounter (Signed)
"  Lvm to call office     "

## 2024-06-16 ENCOUNTER — Inpatient Hospital Stay: Admitting: Oncology

## 2024-06-28 ENCOUNTER — Ambulatory Visit: Admitting: Orthopedic Surgery

## 2024-08-31 ENCOUNTER — Ambulatory Visit: Admitting: Dermatology

## 2025-04-15 ENCOUNTER — Ambulatory Visit
